# Patient Record
Sex: Male | Born: 1945 | Race: White | Hispanic: No | Marital: Married | Smoking: Never smoker
Health system: Southern US, Community
[De-identification: ages and names within clinical notes are randomized; demographics above are authoritative.]

## PROBLEM LIST (undated history)

## (undated) DIAGNOSIS — I480 Paroxysmal atrial fibrillation: Secondary | ICD-10-CM

## (undated) DIAGNOSIS — M1990 Unspecified osteoarthritis, unspecified site: Secondary | ICD-10-CM

## (undated) DIAGNOSIS — C61 Malignant neoplasm of prostate: Secondary | ICD-10-CM

## (undated) DIAGNOSIS — C4490 Unspecified malignant neoplasm of skin, unspecified: Secondary | ICD-10-CM

## (undated) DIAGNOSIS — N400 Enlarged prostate without lower urinary tract symptoms: Secondary | ICD-10-CM

## (undated) DIAGNOSIS — I499 Cardiac arrhythmia, unspecified: Secondary | ICD-10-CM

## (undated) DIAGNOSIS — E785 Hyperlipidemia, unspecified: Secondary | ICD-10-CM

## (undated) DIAGNOSIS — C801 Malignant (primary) neoplasm, unspecified: Secondary | ICD-10-CM

## (undated) DIAGNOSIS — Z9289 Personal history of other medical treatment: Secondary | ICD-10-CM

## (undated) DIAGNOSIS — S76119A Strain of unspecified quadriceps muscle, fascia and tendon, initial encounter: Secondary | ICD-10-CM

## (undated) DIAGNOSIS — Z9861 Coronary angioplasty status: Secondary | ICD-10-CM

## (undated) DIAGNOSIS — R011 Cardiac murmur, unspecified: Secondary | ICD-10-CM

## (undated) DIAGNOSIS — R0600 Dyspnea, unspecified: Secondary | ICD-10-CM

## (undated) DIAGNOSIS — I2510 Atherosclerotic heart disease of native coronary artery without angina pectoris: Secondary | ICD-10-CM

## (undated) DIAGNOSIS — I10 Essential (primary) hypertension: Secondary | ICD-10-CM

## (undated) DIAGNOSIS — C3492 Malignant neoplasm of unspecified part of left bronchus or lung: Secondary | ICD-10-CM

## (undated) DIAGNOSIS — R0989 Other specified symptoms and signs involving the circulatory and respiratory systems: Secondary | ICD-10-CM

## (undated) DIAGNOSIS — K219 Gastro-esophageal reflux disease without esophagitis: Secondary | ICD-10-CM

## (undated) DIAGNOSIS — Z860100 Personal history of colon polyps, unspecified: Secondary | ICD-10-CM

## (undated) DIAGNOSIS — I4892 Unspecified atrial flutter: Secondary | ICD-10-CM

## (undated) HISTORY — DX: Personal history of colon polyps, unspecified: Z86.0100

## (undated) HISTORY — DX: Paroxysmal atrial fibrillation: I48.0

## (undated) HISTORY — DX: Personal history of other medical treatment: Z92.89

## (undated) HISTORY — PX: CORONARY STENT PLACEMENT: SHX1402

## (undated) HISTORY — DX: Essential (primary) hypertension: I10

## (undated) HISTORY — DX: Atherosclerotic heart disease of native coronary artery without angina pectoris: I25.10

## (undated) HISTORY — PX: VASECTOMY: SHX75

## (undated) HISTORY — DX: Coronary angioplasty status: Z98.61

## (undated) HISTORY — DX: Hyperlipidemia, unspecified: E78.5

## (undated) HISTORY — DX: Unspecified atrial flutter: I48.92

## (undated) HISTORY — DX: Unspecified malignant neoplasm of skin, unspecified: C44.90

## (undated) HISTORY — DX: Personal history of colonic polyps: Z86.010

## (undated) HISTORY — PX: COLONOSCOPY: SHX174

## (undated) HISTORY — DX: Benign prostatic hyperplasia without lower urinary tract symptoms: N40.0

## (undated) HISTORY — PX: BACK SURGERY: SHX140

## (undated) HISTORY — DX: Other specified symptoms and signs involving the circulatory and respiratory systems: R09.89

## (undated) HISTORY — DX: Malignant neoplasm of unspecified part of left bronchus or lung: C34.92

---
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
## 2000-01-15 ENCOUNTER — Encounter: Admission: RE | Admit: 2000-01-15 | Discharge: 2000-01-15 | Payer: Self-pay | Admitting: Family Medicine

## 2000-01-15 ENCOUNTER — Encounter: Payer: Self-pay | Admitting: Family Medicine

## 2002-11-23 DIAGNOSIS — I2510 Atherosclerotic heart disease of native coronary artery without angina pectoris: Secondary | ICD-10-CM

## 2002-11-23 DIAGNOSIS — Z9861 Coronary angioplasty status: Secondary | ICD-10-CM

## 2002-11-23 HISTORY — DX: Coronary angioplasty status: Z98.61

## 2002-11-23 HISTORY — DX: Atherosclerotic heart disease of native coronary artery without angina pectoris: I25.10

## 2003-02-22 ENCOUNTER — Encounter: Payer: Self-pay | Admitting: *Deleted

## 2003-02-22 ENCOUNTER — Inpatient Hospital Stay (HOSPITAL_COMMUNITY): Admission: EM | Admit: 2003-02-22 | Discharge: 2003-02-24 | Payer: Self-pay | Admitting: Emergency Medicine

## 2003-03-19 ENCOUNTER — Encounter (HOSPITAL_COMMUNITY): Admission: RE | Admit: 2003-03-19 | Discharge: 2003-05-23 | Payer: Self-pay | Admitting: Internal Medicine

## 2005-08-24 ENCOUNTER — Ambulatory Visit: Payer: Self-pay | Admitting: Family Medicine

## 2005-08-31 ENCOUNTER — Ambulatory Visit: Payer: Self-pay | Admitting: Family Medicine

## 2005-10-23 ENCOUNTER — Ambulatory Visit: Payer: Self-pay | Admitting: Family Medicine

## 2006-09-06 ENCOUNTER — Ambulatory Visit: Payer: Self-pay | Admitting: Family Medicine

## 2006-09-06 LAB — CONVERTED CEMR LAB
ALT: 28 units/L (ref 0–40)
AST: 33 units/L (ref 0–37)
Albumin: 4.1 g/dL (ref 3.5–5.2)
Alkaline Phosphatase: 55 units/L (ref 39–117)
BUN: 19 mg/dL (ref 6–23)
Basophils Absolute: 0 10*3/uL (ref 0.0–0.1)
Basophils Relative: 0.4 % (ref 0.0–1.0)
CO2: 30 meq/L (ref 19–32)
Calcium: 9.3 mg/dL (ref 8.4–10.5)
Chloride: 108 meq/L (ref 96–112)
Chol/HDL Ratio, serum: 3.1
Cholesterol: 128 mg/dL (ref 0–200)
Creatinine, Ser: 1.4 mg/dL (ref 0.4–1.5)
Eosinophil percent: 1.8 % (ref 0.0–5.0)
GFR calc non Af Amer: 55 mL/min
Glomerular Filtration Rate, Af Am: 66 mL/min/{1.73_m2}
Glucose, Bld: 103 mg/dL — ABNORMAL HIGH (ref 70–99)
HCT: 43.3 % (ref 39.0–52.0)
HDL: 41.2 mg/dL (ref >39.0)
Hemoglobin: 14.7 g/dL (ref 13.0–17.0)
Hgb A1c MFr Bld: 5.3 % (ref 4.6–6.0)
LDL Cholesterol: 75 mg/dL (ref 0–99)
Lymphocytes Relative: 41 % (ref 12.0–46.0)
MCHC: 33.9 g/dL (ref 30.0–36.0)
MCV: 93.9 fL (ref 78.0–100.0)
Monocytes Absolute: 0.4 10*3/uL (ref 0.2–0.7)
Monocytes Relative: 9.6 % (ref 3.0–11.0)
Neutro Abs: 1.8 10*3/uL (ref 1.4–7.7)
Neutrophils Relative %: 47.2 % (ref 43.0–77.0)
PSA: 3.27 ng/mL (ref 0.10–4.00)
Platelets: 161 10*3/uL (ref 150–400)
Potassium: 4.1 meq/L (ref 3.5–5.1)
RBC: 4.61 M/uL (ref 4.22–5.81)
RDW: 12.5 % (ref 11.5–14.6)
Sodium: 144 meq/L (ref 135–145)
TSH: 1.25 microintl units/mL (ref 0.35–5.50)
Total Bilirubin: 1.6 mg/dL — ABNORMAL HIGH (ref 0.3–1.2)
Total Protein: 6.8 g/dL (ref 6.0–8.3)
Triglyceride fasting, serum: 57 mg/dL (ref 0–149)
VLDL: 11 mg/dL (ref 0–40)
WBC: 3.9 10*3/uL — ABNORMAL LOW (ref 4.5–10.5)

## 2006-10-20 ENCOUNTER — Ambulatory Visit: Payer: Self-pay | Admitting: Family Medicine

## 2006-11-06 ENCOUNTER — Emergency Department (HOSPITAL_COMMUNITY): Admission: EM | Admit: 2006-11-06 | Discharge: 2006-11-06 | Payer: Self-pay | Admitting: Family Medicine

## 2006-12-15 ENCOUNTER — Encounter: Admission: RE | Admit: 2006-12-15 | Discharge: 2006-12-15 | Payer: Self-pay | Admitting: Orthopedic Surgery

## 2007-01-18 ENCOUNTER — Ambulatory Visit: Payer: Self-pay | Admitting: Family Medicine

## 2007-01-19 ENCOUNTER — Encounter: Payer: Self-pay | Admitting: Family Medicine

## 2007-01-20 ENCOUNTER — Ambulatory Visit: Payer: Self-pay | Admitting: Family Medicine

## 2007-02-03 ENCOUNTER — Ambulatory Visit: Payer: Self-pay | Admitting: Family Medicine

## 2007-03-28 ENCOUNTER — Encounter: Admission: RE | Admit: 2007-03-28 | Discharge: 2007-03-28 | Payer: Self-pay | Admitting: Orthopedic Surgery

## 2007-04-13 ENCOUNTER — Encounter: Admission: RE | Admit: 2007-04-13 | Discharge: 2007-04-13 | Payer: Self-pay | Admitting: Orthopedic Surgery

## 2007-06-16 ENCOUNTER — Inpatient Hospital Stay (HOSPITAL_COMMUNITY): Admission: RE | Admit: 2007-06-16 | Discharge: 2007-06-18 | Payer: Self-pay | Admitting: Neurological Surgery

## 2007-06-24 DIAGNOSIS — E785 Hyperlipidemia, unspecified: Secondary | ICD-10-CM | POA: Insufficient documentation

## 2007-06-24 DIAGNOSIS — Z860100 Personal history of colon polyps, unspecified: Secondary | ICD-10-CM | POA: Insufficient documentation

## 2007-06-24 DIAGNOSIS — I2510 Atherosclerotic heart disease of native coronary artery without angina pectoris: Secondary | ICD-10-CM

## 2007-06-24 DIAGNOSIS — I10 Essential (primary) hypertension: Secondary | ICD-10-CM | POA: Insufficient documentation

## 2007-06-24 DIAGNOSIS — I25119 Atherosclerotic heart disease of native coronary artery with unspecified angina pectoris: Secondary | ICD-10-CM | POA: Insufficient documentation

## 2007-10-18 ENCOUNTER — Ambulatory Visit: Payer: Self-pay | Admitting: Family Medicine

## 2007-10-18 LAB — CONVERTED CEMR LAB
ALT: 36 U/L
AST: 37 U/L
Albumin: 4.1 g/dL
Alkaline Phosphatase: 71 U/L
BUN: 19 mg/dL
Basophils Absolute: 0 K/uL
Basophils Relative: 0.4 %
Bilirubin, Direct: 0.3 mg/dL
CO2: 31 meq/L
Calcium: 9.4 mg/dL
Chloride: 103 meq/L
Cholesterol: 147 mg/dL
Creatinine, Ser: 1.3 mg/dL
Eosinophils Absolute: 0 K/uL
Eosinophils Relative: 1 %
GFR calc Af Amer: 72 mL/min
GFR calc non Af Amer: 59 mL/min
Glucose, Bld: 89 mg/dL
Glucose, Urine, Semiquant: NEGATIVE
HCT: 44.2 %
HDL: 42.4 mg/dL
Hemoglobin: 15.4 g/dL
LDL Cholesterol: 91 mg/dL
Lymphocytes Relative: 42.7 %
MCHC: 34.8 g/dL
MCV: 89.3 fL
Monocytes Absolute: 0.5 K/uL
Monocytes Relative: 11.5 % — ABNORMAL HIGH
Neutro Abs: 2.2 K/uL
Neutrophils Relative %: 44.4 %
Nitrite: NEGATIVE
PSA: 5.82 ng/mL — ABNORMAL HIGH
Platelets: 159 K/uL
Potassium: 4.2 meq/L
RBC: 4.96 M/uL
RDW: 12.6 %
Sodium: 143 meq/L
Specific Gravity, Urine: 1.02
TSH: 1.62 u[IU]/mL
Total Bilirubin: 1.9 mg/dL — ABNORMAL HIGH
Total CHOL/HDL Ratio: 3.5
Total Protein: 6.7 g/dL
Triglycerides: 66 mg/dL
Urobilinogen, UA: 0.2
VLDL: 13 mg/dL
WBC Urine, dipstick: NEGATIVE
WBC: 4.7 10*3/microliter
pH: 5.5

## 2007-10-24 ENCOUNTER — Ambulatory Visit: Payer: Self-pay | Admitting: Family Medicine

## 2007-10-24 DIAGNOSIS — N401 Enlarged prostate with lower urinary tract symptoms: Secondary | ICD-10-CM | POA: Insufficient documentation

## 2008-01-30 ENCOUNTER — Ambulatory Visit: Payer: Self-pay | Admitting: Family Medicine

## 2008-01-30 DIAGNOSIS — H6980 Other specified disorders of Eustachian tube, unspecified ear: Secondary | ICD-10-CM | POA: Insufficient documentation

## 2008-06-28 ENCOUNTER — Ambulatory Visit: Payer: Self-pay | Admitting: Family Medicine

## 2008-11-20 ENCOUNTER — Ambulatory Visit: Payer: Self-pay | Admitting: Family Medicine

## 2008-11-20 LAB — CONVERTED CEMR LAB
ALT: 29 units/L (ref 0–53)
AST: 33 units/L (ref 0–37)
Albumin: 3.7 g/dL (ref 3.5–5.2)
Alkaline Phosphatase: 69 units/L (ref 39–117)
BUN: 17 mg/dL (ref 6–23)
Basophils Absolute: 0 10*3/uL (ref 0.0–0.1)
Basophils Relative: 0.4 % (ref 0.0–3.0)
Bilirubin Urine: NEGATIVE
Bilirubin, Direct: 0.1 mg/dL (ref 0.0–0.3)
CO2: 32 meq/L (ref 19–32)
Calcium: 9 mg/dL (ref 8.4–10.5)
Chloride: 103 meq/L (ref 96–112)
Cholesterol: 137 mg/dL (ref 0–200)
Creatinine, Ser: 1.2 mg/dL (ref 0.4–1.5)
Eosinophils Absolute: 0.1 10*3/uL (ref 0.0–0.7)
Eosinophils Relative: 1.3 % (ref 0.0–5.0)
GFR calc Af Amer: 78 mL/min
GFR calc non Af Amer: 65 mL/min
Glucose, Bld: 96 mg/dL (ref 70–99)
Glucose, Urine, Semiquant: NEGATIVE
HCT: 44.7 % (ref 39.0–52.0)
HDL: 46.6 mg/dL (ref >39.0)
Hemoglobin: 15.4 g/dL (ref 13.0–17.0)
LDL Cholesterol: 77 mg/dL (ref 0–99)
Lymphocytes Relative: 34.6 % (ref 12.0–46.0)
MCHC: 34.5 g/dL (ref 30.0–36.0)
MCV: 92.7 fL (ref 78.0–100.0)
Monocytes Absolute: 0.4 10*3/uL (ref 0.1–1.0)
Monocytes Relative: 8 % (ref 3.0–12.0)
Neutro Abs: 3.1 10*3/uL (ref 1.4–7.7)
Neutrophils Relative %: 55.7 % (ref 43.0–77.0)
Nitrite: NEGATIVE
PSA: 4.53 ng/mL — ABNORMAL HIGH (ref 0.10–4.00)
Platelets: 153 10*3/uL (ref 150–400)
Potassium: 3.7 meq/L (ref 3.5–5.1)
Protein, U semiquant: NEGATIVE
RBC: 4.83 M/uL (ref 4.22–5.81)
RDW: 12.6 % (ref 11.5–14.6)
Sodium: 140 meq/L (ref 135–145)
Specific Gravity, Urine: 1.025
TSH: 1.28 microintl units/mL (ref 0.35–5.50)
Total Bilirubin: 1.3 mg/dL — ABNORMAL HIGH (ref 0.3–1.2)
Total CHOL/HDL Ratio: 2.9
Total Protein: 6.7 g/dL (ref 6.0–8.3)
Triglycerides: 66 mg/dL (ref 0–149)
Urobilinogen, UA: 0.2
VLDL: 13 mg/dL (ref 0–40)
WBC Urine, dipstick: NEGATIVE
WBC: 5.5 10*3/uL (ref 4.5–10.5)
pH: 5.5

## 2008-11-27 ENCOUNTER — Ambulatory Visit: Payer: Self-pay | Admitting: Family Medicine

## 2009-08-01 DIAGNOSIS — I6522 Occlusion and stenosis of left carotid artery: Secondary | ICD-10-CM

## 2009-08-01 HISTORY — DX: Occlusion and stenosis of left carotid artery: I65.22

## 2009-10-09 ENCOUNTER — Encounter (INDEPENDENT_AMBULATORY_CARE_PROVIDER_SITE_OTHER): Payer: Self-pay | Admitting: *Deleted

## 2009-11-01 ENCOUNTER — Encounter (INDEPENDENT_AMBULATORY_CARE_PROVIDER_SITE_OTHER): Payer: Self-pay | Admitting: *Deleted

## 2009-11-04 ENCOUNTER — Ambulatory Visit: Payer: Self-pay | Admitting: Internal Medicine

## 2009-11-25 ENCOUNTER — Telehealth: Payer: Self-pay | Admitting: Internal Medicine

## 2009-11-28 ENCOUNTER — Ambulatory Visit: Payer: Self-pay | Admitting: Family Medicine

## 2009-11-28 LAB — CONVERTED CEMR LAB
ALT: 30 units/L (ref 0–53)
AST: 41 units/L — ABNORMAL HIGH (ref 0–37)
Albumin: 3.7 g/dL (ref 3.5–5.2)
Alkaline Phosphatase: 71 units/L (ref 39–117)
BUN: 15 mg/dL (ref 6–23)
Basophils Absolute: 0 10*3/uL (ref 0.0–0.1)
Basophils Relative: 0.6 % (ref 0.0–3.0)
Bilirubin Urine: NEGATIVE
Bilirubin, Direct: 0.1 mg/dL (ref 0.0–0.3)
CO2: 27 meq/L (ref 19–32)
Calcium: 8.8 mg/dL (ref 8.4–10.5)
Chloride: 106 meq/L (ref 96–112)
Cholesterol: 129 mg/dL (ref 0–200)
Creatinine, Ser: 1.2 mg/dL (ref 0.4–1.5)
Eosinophils Absolute: 0.1 10*3/uL (ref 0.0–0.7)
Eosinophils Relative: 1.7 % (ref 0.0–5.0)
GFR calc non Af Amer: 64.41 mL/min (ref >60)
Glucose, Bld: 96 mg/dL (ref 70–99)
Glucose, Urine, Semiquant: NEGATIVE
HCT: 45.6 % (ref 39.0–52.0)
HDL: 42.8 mg/dL (ref >39.00)
Hemoglobin: 14.8 g/dL (ref 13.0–17.0)
Ketones, urine, test strip: NEGATIVE
LDL Cholesterol: 76 mg/dL (ref 0–99)
Lymphocytes Relative: 38.1 % (ref 12.0–46.0)
Lymphs Abs: 1.6 10*3/uL (ref 0.7–4.0)
MCHC: 32.4 g/dL (ref 30.0–36.0)
MCV: 94.3 fL (ref 78.0–100.0)
Monocytes Absolute: 0.3 10*3/uL (ref 0.1–1.0)
Monocytes Relative: 8.2 % (ref 3.0–12.0)
Neutro Abs: 2.1 10*3/uL (ref 1.4–7.7)
Neutrophils Relative %: 51.4 % (ref 43.0–77.0)
Nitrite: NEGATIVE
PSA: 7.96 ng/mL — ABNORMAL HIGH (ref 0.10–4.00)
Platelets: 165 10*3/uL (ref 150.0–400.0)
Potassium: 3.9 meq/L (ref 3.5–5.1)
RBC: 4.83 M/uL (ref 4.22–5.81)
RDW: 12.8 % (ref 11.5–14.6)
Sodium: 140 meq/L (ref 135–145)
Specific Gravity, Urine: 1.015
TSH: 1 microintl units/mL (ref 0.35–5.50)
Total Bilirubin: 1.4 mg/dL — ABNORMAL HIGH (ref 0.3–1.2)
Total CHOL/HDL Ratio: 3
Total Protein: 7.6 g/dL (ref 6.0–8.3)
Triglycerides: 49 mg/dL (ref 0.0–149.0)
Urobilinogen, UA: 0.2
VLDL: 9.8 mg/dL (ref 0.0–40.0)
WBC Urine, dipstick: NEGATIVE
WBC: 4.1 10*3/uL — ABNORMAL LOW (ref 4.5–10.5)
pH: 5.5

## 2009-12-19 ENCOUNTER — Ambulatory Visit: Payer: Self-pay | Admitting: Internal Medicine

## 2009-12-19 LAB — HM COLONOSCOPY

## 2009-12-20 ENCOUNTER — Encounter: Payer: Self-pay | Admitting: Internal Medicine

## 2009-12-31 ENCOUNTER — Encounter: Payer: Self-pay | Admitting: Family Medicine

## 2010-01-01 ENCOUNTER — Encounter: Payer: Self-pay | Admitting: Family Medicine

## 2010-01-17 ENCOUNTER — Telehealth: Payer: Self-pay | Admitting: Family Medicine

## 2010-06-23 ENCOUNTER — Encounter: Admission: RE | Admit: 2010-06-23 | Discharge: 2010-06-23 | Payer: Self-pay | Admitting: Cardiology

## 2010-06-24 ENCOUNTER — Ambulatory Visit (HOSPITAL_COMMUNITY): Admission: RE | Admit: 2010-06-24 | Discharge: 2010-06-24 | Payer: Self-pay | Admitting: Cardiology

## 2010-12-14 ENCOUNTER — Encounter: Payer: Self-pay | Admitting: Orthopedic Surgery

## 2010-12-23 NOTE — Miscellaneous (Signed)
Summary: [facility] Previsit/prep  Clinical Lists Changes  Medications: Added new medication of DULCOLAX 5 MG  TBEC (BISACODYL) Day before procedure take 2 at 3pm and 2 at 8pm. - Signed Added new medication of METOCLOPRAMIDE HCL 10 MG  TABS (METOCLOPRAMIDE HCL) As per prep instructions. - Signed Added new medication of MIRALAX   POWD (POLYETHYLENE GLYCOL 3350) As per prep  instructions. - Signed Rx of DULCOLAX 5 MG  TBEC (BISACODYL) Day before procedure take 2 at 3pm and 2 at 8pm.;  #4 x 0;  Signed;  Entered by: Wyona Almas RN;  Authorized by: Hart Carwin MD;  Method used: [facility] [address] [phone]*, [address] [place], [place], [place]  [address], Ph: [phone] or [phone], Fax: [phone] Rx of METOCLOPRAMIDE HCL 10 MG  TABS (METOCLOPRAMIDE HCL) As per prep instructions.;  #2 x 0;  Signed;  Entered by: Wyona Almas RN;  Authorized by: Hart Carwin MD;  Method used: [facility] to [facility] [address] [phone]*, [address], [place], [place], [place]  [address], Ph: [phone] or [phone], Fax: [phone] Rx of MIRALAX   POWD (POLYETHYLENE GLYCOL 3350) As per prep  instructions.;  #255gm x 0;  Signed;  Entered by: Wyona Almas RN;  Authorized by: Hart Carwin MD;  Method used: Electronically to [facility] [address] [phone]*, [address], [place], [place], [place]  [address], Ph: [phone] or [phone], Fax: [phone] Observations: Added new observation of NKA: T (11/04/2009 10:34)    Prescriptions: MIRALAX   POWD (POLYETHYLENE GLYCOL 3350) As per prep  instructions.  #255gm x 0   Entered by:   Wyona Almas RN   Authorized by:   Hart Carwin MD   Signed by:   Wyona Almas RN on 11/04/2009   Method used:   Electronically to        [facility] [address] [phone]* (retail)       [address]       [place] , [place]  [address]       Ph: [phone] or [phone]       Fax: [phone]   RxID:    [id number] METOCLOPRAMIDE HCL 10 MG  TABS (METOCLOPRAMIDE HCL) As per prep instructions.  #2 x 0   Entered by:   Wyona Almas RN   Authorized by:   Hart Carwin MD   Signed by:   Wyona Almas RN on 11/04/2009   Method used:   [facility]ronically to        [facility] [address] [phone]* (retail)       [address]       [place], [place]  [address]       Ph: [phone] or [phone]       Fax: [phone]   RxID:   [id number] DULCOLAX 5 MG  TBEC (BISACODYL) Day before procedure take 2 at 3pm and 2 at 8pm.  #4 x 0   Entered by:   Wyona Almas RN   Authorized by:   Hart Carwin MD   Signed by:   Wyona Almas RN on 11/04/2009   Method used:   Electronically to        [facility] [address] [phone]* (retail)       [address]       [place], [place]  [address]       Ph: [phone] or [phone]       Fax: [phone]   RxID:   [id number]

## 2010-12-23 NOTE — Assessment & Plan Note (Signed)
Summary: CPX/JLS   Vital Signs:  Patient Profile:   65 Years Old Male Height:     73 inches (187.96 cm) Weight:      260 pounds Temp:     98.4 degrees F oral BP sitting:   140 / 84  (left arm)  Vitals Entered By: Kern Reap CMA (November 27, 2008 2:02 PM)                 Chief Complaint:  cpx.  History of Present Illness: Charles Robinson is a 66 year old male, nonsmoker, who comes in today for physical valuation.  Because of underlying coronary disease, hyperlipidemia, hypertension, and BPH.  His coronary disease is, stable.  He's asymptomatic.  He is to see his cardiologist in January for a checkup.  His hyperlipidemia history with Vytorin 10 -- 40 nightly, lipids, a goal LDL 77.  He takes HCTZ, 25 mg daily, along with 240 mg of diltiazem  daily.  PPD at home 116/72.  We always given a bottle of nitroglycerin.  He also takes an aspirin daily and Niaspan thousand milligrams two tabs nightly for low HDL.  HDL is 47.  We sent him to see Dr. Vonita Moss urologist last year because elevated PSA. biopsy was negative.  He was placed on Cardura 8 mg nightly however, his blood pressure dropped below hundred and he was lightheaded.  Therefore, they decrease the dose to 4 mg daily.  He saw Dr. Shiela Mayer for follow-up in October.    Current Allergies: No known allergies   Past Medical History:    Reviewed history from 10/24/2007 and no changes required:       Colonic polyps, hx of       Coronary artery disease       Hyperlipidemia       Hypertension       BPH       stents times two 2004       this surgery 2008 lumbar spine x 2   Family History:    Reviewed history from 10/24/2007 and no changes required:       Family History of CAD Male 1st degree relative <60       Family History Hypertension       Family History of Prostate CA 1st degree relative <50  Social History:    Reviewed history from 10/24/2007 and no changes required:       Retired       Never Smoked       Alcohol  use-yes       Drug use-no       Regular exercise-yes    Review of Systems      See HPI   Physical Exam  General:     Well-developed,well-nourished,in no acute distress; alert,appropriate and cooperative throughout examination Head:     Normocephalic and atraumatic without obvious abnormalities. No apparent alopecia or balding. Eyes:     No corneal or conjunctival inflammation noted. EOMI. Perrla. Funduscopic exam benign, without hemorrhages, exudates or papilledema. Vision grossly normal. Ears:     External ear exam shows no significant lesions or deformities.  Otoscopic examination reveals clear canals, tympanic membranes are intact bilaterally without bulging, retraction, inflammation or discharge. Hearing is grossly normal bilaterally. Nose:     External nasal examination shows no deformity or inflammation. Nasal mucosa are pink and moist without lesions or exudates. Mouth:     Oral mucosa and oropharynx without lesions or exudates.  Teeth in good repair. Neck:  No deformities, masses, or tenderness noted. Chest Wall:     No deformities, masses, tenderness or gynecomastia noted. Breasts:     No masses or gynecomastia noted Lungs:     Normal respiratory effort, chest expands symmetrically. Lungs are clear to auscultation, no crackles or wheezes. Heart:     Normal rate and regular rhythm. S1 and S2 normal without gallop, murmur, click, rub or other extra sounds. Abdomen:     Bowel sounds positive,abdomen soft and non-tender without masses, organomegaly or hernias noted. Genitalia:     Testes bilaterally descended without nodularity, tenderness or masses. No scrotal masses or lesions. No penis lesions or urethral discharge. Msk:     No deformity or scoliosis noted of thoracic or lumbar spine.   Pulses:     R and L carotid,radial,femoral,dorsalis pedis and posterior tibial pulses are full and equal bilaterally Extremities:     No clubbing, cyanosis, edema, or deformity  noted with normal full range of motion of all joints.   Neurologic:     No cranial nerve deficits noted. Station and gait are normal. Plantar reflexes are down-going bilaterally. DTRs are symmetrical throughout. Sensory, motor and coordinative functions appear intact. Skin:     Intact without suspicious lesions or rashes Cervical Nodes:     No lymphadenopathy noted Axillary Nodes:     No palpable lymphadenopathy Inguinal Nodes:     No significant adenopathy Psych:     Cognition and judgment appear intact. Alert and cooperative with normal attention span and concentration. No apparent delusions, illusions, hallucinations    Impression & Recommendations:  Problem # 1:  BENIGN PROSTATIC HYPERTROPHY, WITH URINARY OBSTRUCTION (ICD-600.01) Assessment: Unchanged  Problem # 2:  HYPERTENSION (ICD-401.9) Assessment: Improved  His updated medication list for this problem includes:    Cartia Xt 240 Mg Cp24 (Diltiazem hcl coated beads) .[name]... Take 1 tablet by mouth once a day    Hydrochlorothiazide 25 Mg Tabs (Hydrochlorothiazide) ..... One tablet every morning    Cardura 8 Mg Tabs (Doxazosin mesylate) .[name]... 1 tab @ bedtime   Problem # 3:  HYPERLIPIDEMIA (ICD-272.4) Assessment: Improved  His updated medication list for this problem includes:    Vytorin 10-40 Mg Tabs (Ezetimibe-simvastatin) .[name]... Take 1 tablet by mouth at bedtime    Niaspan 1000 Mg Tbcr (Niacin (antihyperlipidemic)) .[name]... 2tabs at bedtime   Problem # 4:  CORONARY ARTERY DISEASE (ICD-414.00) Assessment: Unchanged  His updated medication list for this problem includes:    Cartia Xt 240 Mg Cp24 (Diltiazem hcl coated beads) .[name]... Take 1 tablet by mouth once a day    Aspirin 325 Mg Tabs (Aspirin) .[name]... Take 1 tablet by mouth once a day    Nitroglycerin 0.4 Mg Subl (Nitroglycerin) .[name]... As needed    Hydrochlorothiazide 25 Mg Tabs (Hydrochlorothiazide) ..... One tablet every morning    Cardura 8 Mg Tabs (Doxazosin mesylate)  .[name]... 1 tab @ bedtime   Complete Medication List: 1)  Cartia Xt 240 Mg Cp24 (Diltiazem hcl coated beads) .... Take 1 tablet by mouth once a day 2)  Vytorin 10-40 Mg Tabs (Ezetimibe-simvastatin) .... Take 1 tablet by mouth at bedtime 3)  Aspirin 325 Mg Tabs (Aspirin) .... Take 1 tablet by mouth once a day 4)  Nitroglycerin 0.4 Mg Subl (Nitroglycerin) .... As needed 5)  Hydrochlorothiazide 25 Mg Tabs (Hydrochlorothiazide) .... One tablet every morning 6)  Multivitamins Tabs (Multiple vitamin) 7)  Glucosamine Hcl 1000 Mg Tabs (Glucosamine hcl) .... Take 1 tablet by mouth once a day  8)  Coenzyme Q10 10 Mg Caps (Coenzyme q10) .... Take 1 tablet by mouth once a day 9)  Niaspan 1000 Mg Tbcr (Niacin (antihyperlipidemic)) .... 2tabs at bedtime 10)  Fish Oil  11)  Cardura 8 Mg Tabs (Doxazosin mesylate) .[name].. 1 tab @ bedtime  Other Orders: Zoster (Shingles) Vaccine Live [phone]) Admin 1st Vaccine (11914)   Patient Instructions: 1)  Please schedule a follow-up appointment in 1 year. 2)  It is important that you exercise regularly at least 20 minutes 5 times a week. If you develop chest pain, have severe difficulty breathing, or feel very tired , stop exercising immediately and seek medical attention. 3)  Schedule a colonoscopy/sigmoidoscopy to help detect colon cancer. 4)  Take an Aspirin every day.   Prescriptions: NIASPAN 1000 MG  TBCR (NIACIN (ANTIHYPERLIPIDEMIC)) 2tabs at bedtime  #200 x 4   Entered and Authorized by:   Roderick Pee MD   Signed by:   Roderick Pee MD on 11/27/2008   Method used:   [facility]ronically to        [facility] [address] [phone]* (retail)       [address]       [place], [place]  [address]       Ph: [phone] or [phone]       Fax: [phone]   RxID:   [id number] HYDROCHLOROTHIAZIDE 25 MG  TABS (HYDROCHLOROTHIAZIDE) One tablet every morning  #100 x 4   Entered and Authorized by:   Roderick Pee MD   Signed by:    Roderick Pee MD on 11/27/2008   Method used:   Electronically to        [facility] [address] [phone]* (retail)       [address]       [place], [place]  [address]       Ph: [phone] or [phone]       Fax: [phone]   RxID:   [id number] NITROGLYCERIN 0.4 MG  SUBL (NITROGLYCERIN) as needed  #25 x 1   Entered and Authorized by:   Roderick Pee MD   Signed by:   Roderick Pee MD on 11/27/2008   Method used:   Electronically to        [facility] [address] [phone]* (retail)       [address]       [place], [place]  [address]       Ph: [phone] or [phone]       Fax: [phone]   RxID:   [id number] VYTORIN 10-40 MG  TABS (EZETIMIBE-SIMVASTATIN) Take 1 tablet by mouth at bedtime  #100 x 4   Entered and Authorized by:   Roderick Pee MD   Signed by:   Roderick Pee MD on 11/27/2008   Method used:   Electronically to        [facility] [address] [phone]* (retail)       [address]       [place], [place]  [address]       Ph: [phone] or [phone]       Fax: [phone]   RxID:   [id number] CARTIA XT 240 MG CP24 (DILTIAZEM HCL COATED BEADS) Take 1 tablet by mouth once a day  #100 x 4  Entered and Authorized by:   Roderick Pee MD   Signed by:   Roderick Pee MD on 11/27/2008   Method used:   Electronically to        [facility] [address] [phone]* (retail)       [address]       [place], [place]  [address]       Ph: [phone] or [phone]       Fax: [phone]   RxID:   [id number]  ]  Zostavax # 1    Vaccine Type: Zostavax    Site: right deltoid    Mfr: Merck    Dose: 0.5 ml    Route: Yell    Given by: Kern Reap CMA    Exp. Date: 01/06/2010    Lot #: [id number]

## 2010-12-23 NOTE — Medication Information (Signed)
Summary: Coverage Approval for Vytorin/BCBS  Coverage Approval for Vytorin/BCBS   Imported By: Maryln Gottron 01/06/2010 14:59:42  _____________________________________________________________________  External Attachment:    Type:   Image     Comment:   External Document

## 2010-12-23 NOTE — Assessment & Plan Note (Signed)
Summary: emp-will fast//ccm   Vital Signs:  Patient profile:   65 year old male Height:      73.5 inches Weight:      257 pounds BMI:     33.57 Temp:     98.5 degrees F oral BP sitting:   160 / 98  (left arm) Cuff size:   regular  Vitals Entered By: Kern Reap CMA Duncan Dull) (November 28, 2009 9:22 AM)  Reason for Visit cpx  History of Present Illness: Charles Robinson is a 65 year old, married male, nonsmoker, who comes in today for evaluation of multiple problems.  He has a history of underlying coronary artery disease.  He saw his cardiologist, Dr. Clarene Duke, 6 months ago.  Exam and EKG were normal.  He's to for a cardiac stress test in February.  He exercises 3 times per week gets his heart rate up to about hundred and 30.  No chest pain, shortness of breath, etc..  He has underlying hyperlipidemia, for which he takes 5 torr and plan -- 40 daily.  Will check lipid panel today.  He also has underlying hypertension, for which he takes hydrochlorothiazide 25 mg daily, diltiazem 240 mg daily, Cardura 8 mg nightly,.  BP 160/98.  States BP at home is normal.  He also has a prescription for nitroglycerin for chest pain, which, fortunately, he's not had to use.  He gets routine eye care.  Dental care.  Colonoscopy done, and GI tetanus 2007 seasonal flu 2010 shingles 2010 will give a Pneumovax today.  He does have underlying allergic rhinitis.  It flares up 3 or 4 times a year.  He takes plain Claritin, with a steroid nasal spray.  he's also had a history of elevated PSA.  Biopsies negative.  PSA, however, is between 4 and 5 over the last two years continues to be asymptomatic  Allergies (verified): No Known Drug Allergies  Past History:  Past medical, surgical, family and social histories (including risk factors) reviewed, and no changes noted (except as noted below).  Past Medical History: Reviewed history from 10/24/2007 and no changes required. Colonic polyps, hx of Coronary artery  disease Hyperlipidemia Hypertension BPH stents times two 2004 this surgery 2008 lumbar spine x 2  Past Surgical History: Reviewed history from 06/24/2007 and no changes required. Stents -2004 Colonoscopy-1999  Family History: Reviewed history from 10/24/2007 and no changes required. Family History of CAD Male 1st degree relative <60 Family History Hypertension Family History of Prostate CA 1st degree relative <50  Social History: Reviewed history from 10/24/2007 and no changes required. Retired Never Smoked Alcohol use-yes Drug use-no Regular exercise-yes  Review of Systems      See HPI  Physical Exam  General:  Well-developed,well-nourished,in no acute distress; alert,appropriate and cooperative throughout examination Head:  Normocephalic and atraumatic without obvious abnormalities. No apparent alopecia or balding. Eyes:  No corneal or conjunctival inflammation noted. EOMI. Perrla. Funduscopic exam benign, without hemorrhages, exudates or papilledema. Vision grossly normal. Ears:  External ear exam shows no significant lesions or deformities.  Otoscopic examination reveals clear canals, tympanic membranes are intact bilaterally without bulging, retraction, inflammation or discharge. Hearing is grossly normal bilaterally. Nose:  External nasal examination shows no deformity or inflammation. Nasal mucosa are pink and moist without lesions or exudates. Mouth:  Oral mucosa and oropharynx without lesions or exudates.  Teeth in good repair. Neck:  No deformities, masses, or tenderness noted. Chest Wall:  No deformities, masses, tenderness or gynecomastia noted. Breasts:  No masses or gynecomastia  noted Lungs:  Normal respiratory effort, chest expands symmetrically. Lungs are clear to auscultation, no crackles or wheezes. Heart:  Normal rate and regular rhythm. S1 and S2 normal without gallop, murmur, click, rub or other extra sounds. Abdomen:  Bowel sounds positive,abdomen  soft and non-tender without masses, organomegaly or hernias noted. Rectal:  No external abnormalities noted. Normal sphincter tone. No rectal masses or tenderness. Genitalia:  Testes bilaterally descended without nodularity, tenderness or masses. No scrotal masses or lesions. No penis lesions or urethral discharge. Prostate:  Prostate gland firm and smooth, no enlargement, nodularity, tenderness, mass, asymmetry or induration. Msk:  No deformity or scoliosis noted of thoracic or lumbar spine.   Pulses:  R and L carotid,radial,femoral,dorsalis pedis and posterior tibial pulses are full and equal bilaterally Extremities:  No clubbing, cyanosis, edema, or deformity noted with normal full range of motion of all joints.   Neurologic:  No cranial nerve deficits noted. Station and gait are normal. Plantar reflexes are down-going bilaterally. DTRs are symmetrical throughout. Sensory, motor and coordinative functions appear intact. Skin:  Intact without suspicious lesions or rashes Cervical Nodes:  No lymphadenopathy noted Axillary Nodes:  No palpable lymphadenopathy Inguinal Nodes:  No significant adenopathy Psych:  Cognition and judgment appear intact. Alert and cooperative with normal attention span and concentration. No apparent delusions, illusions, hallucinations   Impression & Recommendations:  Problem # 1:  BENIGN PROSTATIC HYPERTROPHY, WITH URINARY OBSTRUCTION (ICD-600.01) Assessment Unchanged  His updated medication list for this problem includes:    Cardura 8 Mg Tabs (Doxazosin mesylate) .[name]... 1 tab @ bedtime  Orders: Venipuncture (60454) TLB-Lipid Panel (80061-LIPID) TLB-BMP (Basic Metabolic Panel-BMET) (80048-METABOL) TLB-CBC Platelet - w/Differential (85025-CBCD) TLB-Hepatic/Liver Function Pnl (80076-HEPATIC) TLB-TSH (Thyroid Stimulating Hormone) (84443-TSH) TLB-PSA (Prostate Specific Antigen) (84153-PSA) Prescription Created Electronically [phone]) UA Dipstick w/o Micro  (automated)  (81003)  Problem # 2:  HYPERTENSION (ICD-401.9) Assessment: Improved  His updated medication list for this problem includes:    Cartia Xt 240 Mg Cp24 (Diltiazem hcl coated beads) .[name]... Take 1 tablet by mouth once a day    Hydrochlorothiazide 25 Mg Tabs (Hydrochlorothiazide) ..... One tablet every morning    Cardura 8 Mg Tabs (Doxazosin mesylate) .[name]... 1 tab @ bedtime  Orders: Venipuncture (91478) TLB-Lipid Panel (80061-LIPID) TLB-BMP (Basic Metabolic Panel-BMET) (80048-METABOL) TLB-CBC Platelet - w/Differential (85025-CBCD) TLB-Hepatic/Liver Function Pnl (80076-HEPATIC) TLB-TSH (Thyroid Stimulating Hormone) (84443-TSH) TLB-PSA (Prostate Specific Antigen) (84153-PSA) Prescription Created Electronically [phone]) UA Dipstick w/o Micro (automated)  (81003)  Problem # 3:  HYPERLIPIDEMIA (ICD-272.4) Assessment: Improved  His updated medication list for this problem includes:    Vytorin 10-40 Mg Tabs (Ezetimibe-simvastatin) .[name]... Take 1 tablet by mouth at bedtime    Niaspan 1000 Mg Tbcr (Niacin (antihyperlipidemic)) .[name]... 2tabs at bedtime  Orders: Venipuncture (13086) TLB-Lipid Panel (80061-LIPID) TLB-BMP (Basic Metabolic Panel-BMET) (80048-METABOL) TLB-CBC Platelet - w/Differential (85025-CBCD) TLB-Hepatic/Liver Function Pnl (80076-HEPATIC) TLB-TSH (Thyroid Stimulating Hormone) (84443-TSH) TLB-PSA (Prostate Specific Antigen) (84153-PSA) Prescription Created Electronically [phone]) UA Dipstick w/o Micro (automated)  (81003)  Problem # 4:  CORONARY ARTERY DISEASE (ICD-414.00) Assessment: Unchanged  His updated medication list for this problem includes:    Cartia Xt 240 Mg Cp24 (Diltiazem hcl coated beads) .[name]... Take 1 tablet by mouth once a day    Aspirin 325 Mg Tabs (Aspirin) .[name]... Take 1 tablet by mouth once a day    Nitroglycerin 0.4 Mg Subl (Nitroglycerin) .[name]... As needed    Hydrochlorothiazide 25 Mg Tabs (Hydrochlorothiazide) ..... One tablet every morning     Cardura 8 Mg Tabs (  Doxazosin mesylate) .[name]... 1 tab @ bedtime  Orders: Venipuncture (16109) TLB-Lipid Panel (80061-LIPID) TLB-BMP (Basic Metabolic Panel-BMET) (80048-METABOL) TLB-CBC Platelet - w/Differential (85025-CBCD) TLB-Hepatic/Liver Function Pnl (80076-HEPATIC) TLB-TSH (Thyroid Stimulating Hormone) (84443-TSH) TLB-PSA (Prostate Specific Antigen) (84153-PSA) Prescription Created Electronically [phone]) UA Dipstick w/o Micro (automated)  (81003)  Complete Medication List: 1)  Cartia Xt 240 Mg Cp24 (Diltiazem hcl coated beads) .... Take 1 tablet by mouth once a day 2)  Vytorin 10-40 Mg Tabs (Ezetimibe-simvastatin) .... Take 1 tablet by mouth at bedtime 3)  Aspirin 325 Mg Tabs (Aspirin) .... Take 1 tablet by mouth once a day 4)  Nitroglycerin 0.4 Mg Subl (Nitroglycerin) .... As needed 5)  Hydrochlorothiazide 25 Mg Tabs (Hydrochlorothiazide) .... One tablet every morning 6)  Multivitamins Tabs (Multiple vitamin) 7)  Glucosamine Hcl 1000 Mg Tabs (Glucosamine hcl) .... Take 1 tablet by mouth once a day 8)  Coenzyme Q10 10 Mg Caps (Coenzyme q10) .... Take 1 tablet by mouth once a day 9)  Niaspan 1000 Mg Tbcr (Niacin (antihyperlipidemic)) .... 2tabs at bedtime 10)  Fish Oil  11)  Cardura 8 Mg Tabs (Doxazosin mesylate) .[name].. 1 tab @ bedtime 12)  Dulcolax 5 Mg Tbec (Bisacodyl) .... Day before procedure take 2 at 3pm and 2 at 8pm. 13)  Metoclopramide Hcl 10 Mg Tabs (Metoclopramide hcl) .... As per prep instructions. 14)  Miralax Powd (Polyethylene glycol 3350) .... As per prep  instructions.  Other Orders: Pneumococcal Vaccine (09811) Admin 1st Vaccine (91478)  Patient Instructions: 1)  Please schedule a follow-up appointment in 1 year. 2)  It is important that you exercise regularly at least 20 minutes 5 times a week. If you develop chest pain, have severe difficulty breathing, or feel very tired , stop exercising immediately and seek medical attention. 3)  You need to lose weight.  Consider a lower calorie diet and regular exercise.  4)  Take an Aspirin every day. Prescriptions: CARDURA 8 MG  TABS (DOXAZOSIN MESYLATE) 1 tab @ bedtime  #100 x 3   Entered and Authorized by:   Roderick Pee MD   Signed by:   Roderick Pee MD on 11/28/2009   Method used:   Electronically to        [facility] [address] [phone]* (retail)       [address]       [place], [place]  [address]       Ph: [phone] or [phone]       Fax: [phone]   RxID:   [id number] NIASPAN 1000 MG  TBCR (NIACIN (ANTIHYPERLIPIDEMIC)) 2tabs at bedtime  #200 x 3   Entered and Authorized by:   Roderick Pee MD   Signed by:   Roderick Pee MD on 11/28/2009   Method used:   [facility]        [facility] [address] [phone]* (retail)       [address]       [place], [place]  [address]       Ph: [phone] or [phone]       Fax: [phone]   RxID:   [id number] HYDROCHLOROTHIAZIDE 25 MG  TABS (HYDROCHLOROTHIAZIDE) One tablet every morning  #100 x 3   Entered and Authorized by:   Roderick Pee MD   Signed by:   Roderick Pee MD on 11/28/2009   Method used:   [facility]  [facility] [address] [phone]* (retail)       [address]       [place], [place]  [address]       Ph: [phone] or [phone]       Fax: [phone]   RxID:   [id number] NITROGLYCERIN 0.4 MG  SUBL (NITROGLYCERIN) as needed  #25 x 1   Entered and Authorized by:   Roderick Pee MD   Signed by:   Roderick Pee MD on 11/28/2009   Method used:   [facility]ronically to        [facility] [address] [phone]* (retail)       [address]       [place], [place]  [address]       Ph: [phone] or [phone]       Fax: [phone]   RxID:   [id number] VYTORIN 10-40 MG  TABS (EZETIMIBE-SIMVASTATIN) Take 1 tablet by mouth at bedtime  #100 x 3   Entered and Authorized by:   Roderick Pee MD    Signed by:   Roderick Pee MD on 11/28/2009   Method used:   [facility] to        [facility] [address] [phone]* (retail)       [address]       [place], [place]  [address]       Ph: [phone] or [phone]       Fax: [phone]   RxID:   [id number] CARTIA XT 240 MG CP24 (DILTIAZEM HCL COATED BEADS) Take 1 tablet by mouth once a day  #100 x 3   Entered and Authorized by:   Roderick Pee MD   Signed by:   Roderick Pee MD on 11/28/2009   Method used:   [facility] to        [facility] [address] [phone]* (retail)       [address]       [place], [place]  [address]       Ph: [phone] or [phone]       Fax: [phone]   RxID:   [id number]    Immunization History:  Influenza Immunization History:    Influenza:  historical (08/23/2009)  Immunizations Administered:  Pneumonia Vaccine:    Vaccine Type: Pneumovax    Site: right deltoid    Mfr: Merck    Dose: 0.5 ml    Route: IM    Given by: Kern Reap CMA (AAMA)    Exp. Date: 11/02/2010    Lot #: [id number]    Physician counseled: yes    Laboratory Results   Urine Tests    Routine Urinalysis   Color: brown Appearance: Clear Glucose: negative   (Normal Range: Negative) Bilirubin: negative   (Normal Range: Negative) Ketone: negative   (Normal Range: Negative) Spec. Gravity: 1.015   (Normal Range: 1.003-1.035) Blood: 2+   (Normal Range: Negative) pH: 5.5   (Normal Range: 5.0-8.0) Protein: 1+   (Normal Range: Negative) Urobilinogen: 0.2   (Normal Range: 0-1) Nitrite: negative   (Normal Range: Negative) Leukocyte Esterace: negative   (Normal Range: Negative)    Comments: Joanne Chars CMA  November 28, 2009 1:58 PM

## 2010-12-23 NOTE — Assessment & Plan Note (Signed)
Summary: CPX/NJR   Vital Signs:  Patient Profile:   65 Years Old Male Height:     74 inches (187.96 cm) Weight:      251 pounds (114.09 kg) Temp:     98.5 degrees F (36.94 degrees C) oral Pulse rate:   70 / minute Resp:     12 per minute BP sitting:   133 / 82  (left arm)  Pt. in pain?   no  Vitals Entered By: Arcola Jansky, RN (October 24, 2007 9:27 AM)                  Chief Complaint:  cpx and labs done.  History of Present Illness: physical we because of underlying hypertension, hyperlipidemia, coronary disease, and BPH.  Several ice, and a good urine.  No major problems except he had back surgery in July.  He had a ruptured disk with foot drop and Dr. Danielle Dess did too distant.  His lumbar spine.  He is recovered and has done okay.  He continues to have nocturia x 3, because his BPH.  He tried Flomax twice, but on both occasions, developed a rapid heart rate and stopped it.  We discussed increasing the Cardura to see if that would help the BPH.  We also discussed other medical options, including a surgical option.  The urology.  He said no chest pain.  He exercises on a regular basis without chest pain, nor shortness of breath.  He gets cardiac eval yearly.  Acute Visit History:      He denies abdominal pain, chest pain, constipation, cough, diarrhea, earache, eye symptoms, fever, genitourinary symptoms, headache, musculoskeletal symptoms, nasal discharge, nausea, rash, sinus problems, sore throat, and vomiting.         Current Allergies: No known allergies   Past Medical History:    Reviewed history from 06/24/2007 and no changes required:       Colonic polyps, hx of       Coronary artery disease       Hyperlipidemia       Hypertension       BPH       stents times two 2004       this surgery 2008 lumbar spine x 2   Family History:    Reviewed history and no changes required:       Family History of CAD Male 1st degree relative <60       Family  History Hypertension       Family History of Prostate CA 1st degree relative <50  Social History:    Reviewed history and no changes required:       Retired       Never Smoked       Alcohol use-yes       Drug use-no       Regular exercise-yes   Risk Factors:  Tobacco use:  never Drug use:  no Alcohol use:  yes Exercise:  yes   Review of Systems      See HPI   Physical Exam  General:     Well-developed,well-nourished,in no acute distress; alert,appropriate and cooperative throughout examination Head:     Normocephalic and atraumatic without obvious abnormalities. No apparent alopecia or balding. Eyes:     No corneal or conjunctival inflammation noted. EOMI. Perrla. Funduscopic exam benign, without hemorrhages, exudates or papilledema. Vision grossly normal. Ears:     External ear exam shows no significant lesions or deformities.  Otoscopic examination  reveals clear canals, tympanic membranes are intact bilaterally without bulging, retraction, inflammation or discharge. Hearing is grossly normal bilaterally. Nose:     External nasal examination shows no deformity or inflammation. Nasal mucosa are pink and moist without lesions or exudates. Mouth:     Oral mucosa and oropharynx without lesions or exudates.  Teeth in good repair. Neck:     No deformities, masses, or tenderness noted. Chest Wall:     No deformities, masses, tenderness or gynecomastia noted. Breasts:     No masses or gynecomastia noted Lungs:     Normal respiratory effort, chest expands symmetrically. Lungs are clear to auscultation, no crackles or wheezes. Heart:     Normal rate and regular rhythm. S1 and S2 normal without gallop, murmur, click, rub or other extra sounds. Abdomen:     Bowel sounds positive,abdomen soft and non-tender without masses, organomegaly or hernias noted. Rectal:     No external abnormalities noted. Normal sphincter tone. No rectal masses or tenderness. Genitalia:     Testes  bilaterally descended without nodularity, tenderness or masses. No scrotal masses or lesions. No penis lesions or urethral discharge. Prostate:     rectum was normal.  Prostate 2+ symmetrical.  BPH Msk:     No deformity or scoliosis noted of thoracic or lumbar spine.   Pulses:     R and L carotid,radial,femoral,dorsalis pedis and posterior tibial pulses are full and equal bilaterally Extremities:     No clubbing, cyanosis, edema, or deformity noted with normal full range of motion of all joints.   Neurologic:     No cranial nerve deficits noted. Station and gait are normal. Plantar reflexes are down-going bilaterally. DTRs are symmetrical throughout. Sensory, motor and coordinative functions appear intact. Skin:     Intact without suspicious lesions or rashes Cervical Nodes:     No lymphadenopathy noted Axillary Nodes:     No palpable lymphadenopathy Inguinal Nodes:     No significant adenopathy Psych:     Cognition and judgment appear intact. Alert and cooperative with normal attention span and concentration. No apparent delusions, illusions, hallucinations    Impression & Recommendations:  Problem # 1:  PHYSICAL EXAMINATION (ICD-V70.0) Assessment: Unchanged  Problem # 2:  HYPERTENSION (ICD-401.9) Assessment: Improved  The following medications were removed from the medication list:    Doxazosin Mesylate 2 Mg Tabs (Doxazosin mesylate) .[name]... Take 1 tab by mouth at bedtime    Accupril 10 Mg Tabs (Quinapril hcl) .[name]... Take 1 tablet by mouth two times a day  His updated medication list for this problem includes:    Cartia Xt 240 Mg Cp24 (Diltiazem hcl coated beads) .[name]... Take 1 tablet by mouth once a day    Hydrochlorothiazide 25 Mg Tabs (Hydrochlorothiazide) ..... One tablet every morning    Cardura 8 Mg Tabs (Doxazosin mesylate) .[name]... 1 tab @ bedtime   Problem # 3:  HYPERLIPIDEMIA (ICD-272.4) Assessment: Improved  His updated medication list for this problem includes:     Vytorin 10-40 Mg Tabs (Ezetimibe-simvastatin) .[name]... Take 1 tablet by mouth at bedtime    Niaspan 1000 Mg Tbcr (Niacin (antihyperlipidemic)) .[name]... 2tabs at bedtime   Problem # 4:  CORONARY ARTERY DISEASE (ICD-414.00) Assessment: Unchanged  The following medications were removed from the medication list:    Doxazosin Mesylate 2 Mg Tabs (Doxazosin mesylate) .[name]... Take 1 tab by mouth at bedtime    Accupril 10 Mg Tabs (Quinapril hcl) .[name]... Take 1 tablet by mouth two times a day  His updated medication list for this problem includes:    Cartia Xt 240 Mg Cp24 (Diltiazem hcl coated beads) .[name]... Take 1 tablet by mouth once a day    Aspirin 325 Mg Tabs (Aspirin) .[name]... Take 1 tablet by mouth once a day    Nitroglycerin 0.4 Mg Subl (Nitroglycerin) .[name]... As needed    Hydrochlorothiazide 25 Mg Tabs (Hydrochlorothiazide) ..... One tablet every morning    Cardura 8 Mg Tabs (Doxazosin mesylate) .[name]... 1 tab @ bedtime   Problem # 5:  BENIGN PROSTATIC HYPERTROPHY, WITH URINARY OBSTRUCTION (ICD-600.01) Assessment: Deteriorated  Complete Medication List: 1)  Cartia Xt 240 Mg Cp24 (Diltiazem hcl coated beads) .... Take 1 tablet by mouth once a day 2)  Vytorin 10-40 Mg Tabs (Ezetimibe-simvastatin) .... Take 1 tablet by mouth at bedtime 3)  Aspirin 325 Mg Tabs (Aspirin) .... Take 1 tablet by mouth once a day 4)  Nitroglycerin 0.4 Mg Subl (Nitroglycerin) .... As needed 5)  Saw Palmetto 80 Mg Caps (Saw palmetto (serenoa repens)) .... Otc 6)  Hydrochlorothiazide 25 Mg Tabs (Hydrochlorothiazide) .... One tablet every morning 7)  Multivitamins Tabs (Multiple vitamin) 8)  Glucosamine Hcl 1000 Mg Tabs (Glucosamine hcl) .... Take 1 tablet by mouth once a day 9)  Coenzyme Q10 10 Mg Caps (Coenzyme q10) .... Take 1 tablet by mouth once a day 10)  Niaspan 1000 Mg Tbcr (Niacin (antihyperlipidemic)) .... 2tabs at bedtime 11)  Fish Oil  12)  Cardura 8 Mg Tabs (Doxazosin mesylate) .[name].. 1 tab @ bedtime  Other Orders:  Influenza Vaccine MCR (81191)   Patient Instructions: 1)  Please schedule a follow-up appointment in 1 year. 2)  It is important that you exercise regularly at least 20 minutes 5 times a week. If you develop chest pain, have severe difficulty breathing, or feel very tired , stop exercising immediately and seek medical attention 3)  increase the Cardura from 2 mg a day to 8 mg a day.  Let's see if this will help with the prostate issue.  Give it two months.  If after that, you don't see any improvement.  Call and will discuss other options. 4)   also, because your PSA was elevated call Dr. Sharon Seller, Shiela Mayer for evaluation in 3 weeks.  In the meantime take his Cipro, 5 mg twice a day to see if the elevation is due to some subacute inflammation.    Prescriptions: NIASPAN 1000 MG  TBCR (NIACIN (ANTIHYPERLIPIDEMIC)) 2tabs at bedtime  #200 x 4   Entered and Authorized by:   Roderick Pee MD   Signed by:   Roderick Pee MD on 10/24/2007   Method used:   Electronically sent to ...       [facility] [address] [phone]*       [address]       [place], [place]  [address]       Ph: [phone] or [phone]       Fax: [phone]   RxID:   [id number] HYDROCHLOROTHIAZIDE 25 MG  TABS (HYDROCHLOROTHIAZIDE) One tablet every morning  #100 x 4   Entered and Authorized by:   Roderick Pee MD   Signed by:   Roderick Pee MD on 10/24/2007   Method used:   Electronically sent to ...       [facility] [address] [phone]*       [address]       [place]  [place], [place]  [address]       Ph: [phone] or [phone]       Fax: [phone]   RxID:   [id number] NITROGLYCERIN 0.4 MG  SUBL (NITROGLYCERIN) as needed  #25 x 1   Entered and Authorized by:   Roderick Pee MD   Signed by:   Roderick Pee MD on 10/24/2007   Method used:   Electronically sent to ...       [facility] [address] [phone]*       [address]       [place], [place]  [address]       Ph: [phone] or [phone]       Fax: [phone]   RxID:   [id number] VYTORIN 10-40 MG  TABS (EZETIMIBE-SIMVASTATIN) Take 1 tablet by mouth at bedtime  #100 x 4   Entered and Authorized by:   Roderick Pee MD   Signed by:   Roderick Pee MD on 10/24/2007   Method used:   Electronically sent to ...       [facility] [address] [phone]*       [address]       [place], [place]  [address]       Ph: [phone] or [phone]       Fax: [phone]   RxID:   [id number] CARTIA XT 240 MG CP24 (DILTIAZEM HCL COATED BEADS) Take 1 tablet by mouth once a day  #100 x 4   Entered and Authorized by:   Roderick Pee MD   Signed by:   Roderick Pee MD on 10/24/2007   Method used:   Electronically sent to ...       [facility] [address] [phone]*       [address]       [place], [place]  [address]       Ph: [phone] or [phone]       Fax: [phone]   RxID:   [id number] CARDURA 8 MG  TABS (DOXAZOSIN MESYLATE) 1 tab @ bedtime  #100 x 4   Entered and Authorized by:   Roderick Pee MD   Signed by:   Roderick Pee MD on 10/24/2007   Method used:   Electronically sent to ...       [facility] [address] [phone]*       [address]       [place], [place]  [address]       Ph: [phone] or [phone]       Fax: [phone]   RxID:   [id number]  ]  Tetanus/Td Immunization History:    Tetanus/Td # 1:  Td (10/23/2006)  Influenza Vaccine    Vaccine Type: Fluvax MCR    Given by: dr Tawanna Cooler  Flu Vaccine Consent Questions    Do you have a history of severe allergic reactions to this vaccine? no    Any prior history of allergic reactions to egg and/or gelatin? no    Do you have a sensitivity to the preservative Thimersol? no    Do you have a past history of Guillan-Barre Syndrome? no    Do you currently have an acute febrile  illness? no    Have you  ever had a severe reaction to latex? no    Vaccine information given and explained to patient? yes

## 2010-12-23 NOTE — Assessment & Plan Note (Signed)
Summary: CONGESTION/CCM   Vital Signs:  Patient Profile:   65 Years Old Male Height:     74 inches (187.96 cm) Weight:      260 pounds Temp:     98.1 degrees F oral BP sitting:   152 / 88  (left arm)  Vitals Entered By: Rock Nephew CMA (January 30, 2008 2:43 PM)                 Chief Complaint:  head congestion x 3-4 days.  History of Present Illness: Charles Robinson is a 65 year old male, who is going to [place] tomorrow to comes in today because of head congestion, but won't clear.  He concerned about flying with the severe head congestion.  He does have a history of allergic rhinitis season.  Nonsmoker    Current Allergies: No known allergies    Social History:    Reviewed history from 10/24/2007 and no changes required:       Retired       Never Smoked       Alcohol use-yes       Drug use-no       Regular exercise-yes    Review of Systems      See HPI   Physical Exam  General:     Well-developed,well-nourished,in no acute distress; alert,appropriate and cooperative throughout examination Head:     Normocephalic and atraumatic without obvious abnormalities. No apparent alopecia or balding. Eyes:     No corneal or conjunctival inflammation noted. EOMI. Perrla. Funduscopic exam benign, without hemorrhages, exudates or papilledema. Vision grossly normal. Ears:     External ear exam shows no significant lesions or deformities.  Otoscopic examination reveals clear canals, tympanic membranes are intact bilaterally without bulging, retraction, inflammation or discharge. Hearing is grossly normal bilaterally. Nose:     External nasal examination shows no deformity or inflammation. Nasal mucosa are pink and moist without lesions or exudates. Mouth:     Oral mucosa and oropharynx without lesions or exudates.  Teeth in good repair. Neck:     No deformities, masses, or tenderness noted. Chest Wall:     No deformities, masses, tenderness or gynecomastia noted. Lungs:  Normal respiratory effort, chest expands symmetrically. Lungs are clear to auscultation, no crackles or wheezes.    Impression & Recommendations:  Problem # 1:  EUSTACHIAN TUBE DYSFUNCTION, LEFT (ICD-381.81) Assessment: New  Complete Medication List: 1)  Cartia Xt 240 Mg Cp24 (Diltiazem hcl coated beads) .... Take 1 tablet by mouth once a day 2)  Vytorin 10-40 Mg Tabs (Ezetimibe-simvastatin) .... Take 1 tablet by mouth at bedtime 3)  Aspirin 325 Mg Tabs (Aspirin) .... Take 1 tablet by mouth once a day 4)  Nitroglycerin 0.4 Mg Subl (Nitroglycerin) .... As needed 5)  Saw Palmetto 80 Mg Caps (Saw palmetto (serenoa repens)) .... Otc 6)  Hydrochlorothiazide 25 Mg Tabs (Hydrochlorothiazide) .... One tablet every morning 7)  Multivitamins Tabs (Multiple vitamin) 8)  Glucosamine Hcl 1000 Mg Tabs (Glucosamine hcl) .... Take 1 tablet by mouth once a day 9)  Coenzyme Q10 10 Mg Caps (Coenzyme q10) .... Take 1 tablet by mouth once a day 10)  Niaspan 1000 Mg Tbcr (Niacin (antihyperlipidemic)) .... 2tabs at bedtime 11)  Fish Oil  12)  Cardura 8 Mg Tabs (Doxazosin mesylate) .[name].. 1 tab @ bedtime   Patient Instructions: 1)  take a short course of prednisone before you supplied.  Also take Afrin nasal spray with you    ]

## 2010-12-23 NOTE — Assessment & Plan Note (Signed)
Summary: stitch removal/mhf    Chief Complaint:  suture removal.  History of Present Illness: Charles Robinson is a 65 year old male, who comes in today.  Have sutures removed from the index finger of his left hand.  He had these put in an urgent care center about 10 days ago.  He needs them removed.  He's had no problems.  Wound is well-healed.  No infection.      Prior Medication List:  CARTIA XT 240 MG CP24 (DILTIAZEM HCL COATED BEADS) Take 1 tablet by mouth once a day VYTORIN 10-40 MG  TABS (EZETIMIBE-SIMVASTATIN) Take 1 tablet by mouth at bedtime ASPIRIN 325 MG  TABS (ASPIRIN) Take 1 tablet by mouth once a day NITROGLYCERIN 0.4 MG  SUBL (NITROGLYCERIN) as needed SAW PALMETTO 80 MG  CAPS (SAW PALMETTO (SERENOA REPENS)) otc HYDROCHLOROTHIAZIDE 25 MG  TABS (HYDROCHLOROTHIAZIDE) One tablet every morning MULTIVITAMINS   TABS (MULTIPLE VITAMIN)  GLUCOSAMINE HCL 1000 MG  TABS (GLUCOSAMINE HCL) Take 1 tablet by mouth once a day COENZYME Q10 10 MG  CAPS (COENZYME Q10) Take 1 tablet by mouth once a day NIASPAN 1000 MG  TBCR (NIACIN (ANTIHYPERLIPIDEMIC)) 2tabs at bedtime * FISH OIL  CARDURA 8 MG  TABS (DOXAZOSIN MESYLATE) 1 tab @ bedtime   Current Allergies: No known allergies       Physical Exam  General:     Well-developed,well-nourished,in no acute distress; alert,appropriate and cooperative throughout examination Skin:     sutures x 7 removed from index finger wound, low heeled    Impression & Recommendations:  Problem # 1:  ENCOUNTER FOR REMOVAL OF SUTURES (ICD-V58.32) Assessment: New  Complete Medication List: 1)  Cartia Xt 240 Mg Cp24 (Diltiazem hcl coated beads) .... Take 1 tablet by mouth once a day 2)  Vytorin 10-40 Mg Tabs (Ezetimibe-simvastatin) .... Take 1 tablet by mouth at bedtime 3)  Aspirin 325 Mg Tabs (Aspirin) .... Take 1 tablet by mouth once a day 4)  Nitroglycerin 0.4 Mg Subl (Nitroglycerin) .... As needed 5)  Saw Palmetto 80 Mg Caps (Saw palmetto (serenoa  repens)) .... Otc 6)  Hydrochlorothiazide 25 Mg Tabs (Hydrochlorothiazide) .... One tablet every morning 7)  Multivitamins Tabs (Multiple vitamin) 8)  Glucosamine Hcl 1000 Mg Tabs (Glucosamine hcl) .... Take 1 tablet by mouth once a day 9)  Coenzyme Q10 10 Mg Caps (Coenzyme q10) .... Take 1 tablet by mouth once a day 10)  Niaspan 1000 Mg Tbcr (Niacin (antihyperlipidemic)) .... 2tabs at bedtime 11)  Fish Oil  12)  Cardura 8 Mg Tabs (Doxazosin mesylate) .[name].. 1 tab @ bedtime   Patient Instructions: 1)  such her finger in warm water once a day for 10 minutes.  Keep the wound clean and covered with a Band-Aid.  Do this daily for a week after that the wound should be well healed and then leave it open to the air 2)  Please schedule a follow-up appointment as needed.   ]

## 2010-12-23 NOTE — Progress Notes (Signed)
Summary: Canceled Colonoscopy  Phone Note Call from Patient   Caller: Patient Call For: Dr. Juanda Chance Reason for Call: Talk to Nurse Summary of Call: Pt rescheduled his colonoscopy from tomorrow 1.4.11 to 1.27.11 because he is sick with a cold. Wants to know if he will be charged cancelation fee? Initial call taken by: Karna Christmas,  November 25, 2009 8:36 AM  Follow-up for Phone Call        Please do not charge patient. Pt. is aware. Follow-up by: Laureen Ochs LPN,  November 25, 2009 2:27 PM  Additional Follow-up for Phone Call Additional follow up Details #1::        Patient NOT BILLED. Additional Follow-up by: Leanor Kail [facility],  November 26, 2009 8:33 AM

## 2010-12-23 NOTE — Letter (Signed)
Summary: [facility] Instructions  [facility]  [address] [place], [place] [address]   Phone: [phone]  Fax: [phone]       [name]    07-03-1946    MRN: [id number]       Procedure Day [name]: Charles Robinson. 11/26/09     Arrival Time: 8:30am     Procedure Time: 9:30am     Location of Procedure:                    _X _  [facility] (4th Floor) )   PREPARATION FOR COLONOSCOPY WITH MIRALAX  Starting 5 days prior to your procedure 11/21/09  do not eat nuts, seeds, popcorn, corn, beans, peas,  salads, or any raw vegetables.  Do not take any fiber supplements (e.g. Metamucil, Citrucel, and Benefiber). ____________________________________________________________________________________________________   THE DAY BEFORE YOUR PROCEDURE         DATE: 11/25/09  DAY:  Mon.  1   Drink clear liquids the entire day-NO SOLID FOOD  2   Do not drink anything colored red or purple.  Avoid juices with pulp.  No orange juice.  3   Drink at least 64 oz. (8 glasses) of fluid/clear liquids during the day to prevent dehydration and help the prep work efficiently.  CLEAR LIQUIDS INCLUDE: Water Jello Ice Popsicles Tea (sugar ok, no milk/cream) Powdered fruit flavored drinks Coffee (sugar ok, no milk/cream) Gatorade Juice: apple, white grape, white cranberry  Lemonade Clear bullion, consomm, broth Carbonated beverages (any kind) Strained chicken noodle soup Hard Candy  4   Mix the entire bottle of Miralax with 64 oz. of Gatorade/Powerade in the morning and put in the refrigerator to chill.  5   At 3:00 pm take 2 Dulcolax/Bisacodyl tablets.  6   At 4:30 pm take one Reglan/Metoclopramide tablet.  7  Starting at 5:00 pm drink one 8 oz glass of the Miralax mixture every 15-20 minutes until you have finished drinking the entire 64 oz.  You should finish drinking prep around 7:30 or 8:00 pm.  8   If you are nauseated, you may take the 2nd Reglan/Metoclopramide  tablet at 6:30 pm.        9    At 8:00 pm take 2 more DULCOLAX/Bisacodyl tablets.     THE DAY OF YOUR PROCEDURE      DATE:  11/26/09 DAY: Charles Robinson.  You may drink clear liquids until 7:30am   (2 HOURS BEFORE PROCEDURE).   MEDICATION INSTRUCTIONS  Unless otherwise instructed, you should take regular prescription medications with a small sip of water as early as possible the morning of your procedure.  Additional medication instructions: Hold HCTZ the morning of procedure.  Take Diltiazem HCL         OTHER INSTRUCTIONS  You will need a responsible adult at least 67 years of age to accompany you and drive you home.   This person must remain in the waiting room during your procedure.  Wear loose fitting clothing that is easily removed.  Leave jewelry and other valuables at home.  However, you may wish to bring a book to read or an iPod/MP3 player to listen to music as you wait for your procedure to start.  Remove all body piercing jewelry and leave at home.  Total time from sign-in until discharge is approximately 2-3 hours.  You should go home directly after your procedure and rest.  You can resume normal activities the day after your procedure.  The day of your procedure you should not:   Drive   Make legal decisions   Operate machinery   Drink alcohol   Return to work  You will receive specific instructions about eating, activities and medications before you leave.   The above instructions have been reviewed and explained to me by   Wyona Almas RN  November 04, 2009 10:58 AM     I fully understand and can verbalize these instructions _____________________________ Date _______

## 2010-12-23 NOTE — Progress Notes (Signed)
Summary: refills  Phone Note Refill Request Message from:  Fax from Pharmacy on January 17, 2010 1:39 PM  Refills Requested: Medication #1:  HYDROCHLOROTHIAZIDE 25 MG  TABS One tablet every morning  Medication #2:  CARTIA XT 240 MG CP24 Take 1 tablet by mouth once a day  Medication #3:  CARDURA 8 MG  TABS 1 tab @ bedtime.  Medication #4:  NIASPAN 1000 MG  TBCR 2tabs at bedtime Initial call taken by: Kern Reap CMA Duncan Dull),  January 17, 2010 1:40 PM    Prescriptions: CARDURA 8 MG  TABS (DOXAZOSIN MESYLATE) 1 tab @ bedtime  #100 x 3   Entered by:   Kern Reap CMA (AAMA)   Authorized by:   Roderick Pee MD   Signed by:   Kern Reap CMA (AAMA) on 01/17/2010   Method used:   [facility]        [facility] [address] [phone]* (retail)       [address]       [place], [place]  [address]       Ph: [phone] or [phone]       Fax: [phone]   RxID:   [id number] NIASPAN 1000 MG  TBCR (NIACIN (ANTIHYPERLIPIDEMIC)) 2tabs at bedtime  #200 x 3   Entered by:   Kern Reap CMA (AAMA)   Authorized by:   Roderick Pee MD   Signed by:   Kern Reap CMA (AAMA) on 01/17/2010   Method used:   Electronically to        [facility] [address] [phone]* (retail)       [address]       [place], [place]  [address]       Ph: [phone] or [phone]       Fax: [phone]   RxID:   [id number] CARTIA XT 240 MG CP24 (DILTIAZEM HCL COATED BEADS) Take 1 tablet by mouth once a day  #100 x 3   Entered by:   Kern Reap CMA (AAMA)   Authorized by:   Roderick Pee MD   Signed by:   Kern Reap CMA (AAMA) on 01/17/2010   Method used:   [facility]        [facility] [address] [phone]* (retail)       [address]       [place], [place]  [address]       Ph: [phone] or [phone]       Fax: [phone]   RxID:   [id number] HYDROCHLOROTHIAZIDE 25 MG  TABS  (HYDROCHLOROTHIAZIDE) One tablet every morning  #100 x 3   Entered by:   Kern Reap CMA (AAMA)   Authorized by:   Roderick Pee MD   Signed by:   Kern Reap CMA (AAMA) on 01/17/2010   Method used:   Electronically to        [facility] [address] [phone]* (retail)       [address]       [place], [place]  [address]       Ph: [phone] or [phone]       Fax: [phone]   RxID:   [id number]

## 2010-12-23 NOTE — Letter (Signed)
Summary: Previsit letter  [facility]  [address] [place], [place] [address]   Phone: [phone]  Fax: [phone]       10/09/2009 MRN: [id number]  Charles Robinson [address] [place], [place]  [address]  Dear [name],  Welcome to the Gastroenterology Division at [facility].    You are scheduled to see a nurse for your pre-procedure visit on 11/04/2009 at 10:30am on the 3rd floor at [facility], [address]  We ask that you try to arrive at our office 15 minutes prior to your appointment time to allow for check-in.  Your nurse visit will consist of discussing your medical and surgical history, your immediate family medical history, and your medications.    Please bring a complete list of all your medications or, if you prefer, bring the medication bottles and we will list them.  We will need to be aware of both prescribed and over the counter drugs.  We will need to know exact dosage information as well.  If you are on blood thinners (Coumadin, Plavix, Aggrenox, Ticlid, etc.) please call our office today/prior to your appointment, as we need to consult with your physician about holding your medication.   Please be prepared to read and sign documents such as consent forms, a financial agreement, and acknowledgement forms.  If necessary, and with your consent, a friend or relative is welcome to sit-in on the nurse visit with you.  Please bring your insurance card so that we may make a copy of it.  If your insurance requires a referral to see a specialist, please bring your referral form from your primary care physician.  No co-pay is required for this nurse visit.     If you cannot keep your appointment, please call [phone] to cancel or reschedule prior to your appointment date.  This allows [place] the opportunity to schedule an appointment for another patient in need of care.    Thank you for choosing [facility] for your medical  needs.  We appreciate the opportunity to care for you.  Please visit [place] at our website  to learn more about our practice.                     Sincerely.                                                                                                                   The Gastroenterology Division

## 2010-12-23 NOTE — Procedures (Signed)
Summary: Colonoscopy  Patient: Breanna Mcdaniel Note: All result statuses are Final unless otherwise noted.  Tests: (1) Colonoscopy (COL)   COL Colonoscopy           DONE     [facility]     [address]     [place], [place]  [address]           COLONOSCOPY PROCEDURE REPORT           PATIENT:  Esgar, Barnick  MR#:  [id number]     BIRTHDATE:  [birth year], 63 yrs. old  GENDER:  male           ENDOSCOPIST:  Hedwig Morton. Juanda Chance, MD     Referred by:           PROCEDURE DATE:  12/19/2009     PROCEDURE:  Colonoscopy 19[address]     ASA CLASS:  Class I     INDICATIONS:  colon polyp1999 not retrieved     no polyps 2003           MEDICATIONS:   Versed 8 mg, Fentanyl 75 mcg           DESCRIPTION OF PROCEDURE:   After the risks benefits and     alternatives of the procedure were thoroughly explained, informed     consent was obtained.  Digital rectal exam was performed and     revealed no rectal masses.   The LB CF-[id number] endoscope     was introduced through the anus and advanced to the cecum, which     was identified by both the appendix and ileocecal valve, without     limitations.  The quality of the prep was good, using MiraLax.     The instrument was then slowly withdrawn as the colon was fully     examined.     <<PROCEDUREIMAGES>>           FINDINGS:  Two polyps were found in the sigmoid colon (see     image1). at 25 cm, 2 diminutive polyps removed with cold biopsy     Mild diverticulosis was found in the sigmoid colon (see image3).     This was otherwise a normal examination of the colon (see image4     and image2).   Retroflexed views in the rectum revealed no     abnormalities.    The scope was then withdrawn from the patient     and the procedure completed.           COMPLICATIONS:  None           ENDOSCOPIC IMPRESSION:     1) Two polyps in the sigmoid colon     2) Mild diverticulosis in the sigmoid colon     3) Otherwise normal examination     RECOMMENDATIONS:  1) Await biopsy results     2) high fiber diet           REPEAT EXAM:  In 7 - 10 year(s) for.           ______________________________     Hedwig Morton. Juanda Chance, MD           CC:  Roderick Pee, MD           n.     Rosalie DoctorHedwig Morton. Marionna Gonia at 12/19/2009 10:15 AM           Kelton Pillar, [phone]  Note: An exclamation mark (!) indicates a result  that was not dispersed into the flowsheet. Document Creation Date: 12/19/2009 10:15 AM _______________________________________________________________________  (1) Order result status: Final Collection or observation date-time: 12/19/2009 10:08 Requested date-time:  Receipt date-time:  Reported date-time:  Referring Physician:   Ordering Physician: Lina Sar [phone]) Specimen Source:  Source: Launa Grill Order Number: [id number] Lab site:   Appended Document: Colonoscopy     Colonoscopy  Procedure date:  12/19/2009  Findings:      Location:  [facility].    Appended Document: Colonoscopy     Procedures Next Due Date:    Colonoscopy: 12/2016

## 2010-12-23 NOTE — Letter (Signed)
Summary: Patient Notice- Polyp Results  [name]  [address] [place], [place] [address]   Phone: [phone]  Fax: [phone]        December 20, 2009 MRN: [id number]    Charles Robinson [address] [place], [place]  [address]    Dear [name],  I am pleased to inform you that the colon polyp(s) removed during your recent colonoscopy was (were) found to be benign (no cancer detected) upon pathologic examination.Both polyps were hyperplastic  I recommend you have a repeat colonoscopy examination in 7_ years to look for recurrent polyps, as having colon polyps increases your risk for having recurrent polyps or even colon cancer in the future.  Should you develop new or worsening symptoms of abdominal pain, bowel habit changes or bleeding from the rectum or bowels, please schedule an evaluation with either your primary care physician or with me.  Additional information/recommendations:  _x_ No further action with gastroenterology is needed at this time. Please      follow-up with your primary care physician for your other healthcare      needs.  __ Please call [phone] to schedule a return visit to review your      situation.  __ Please keep your follow-up visit as already scheduled.  __ Continue treatment plan as outlined the day of your exam.  Please call us if you are having persistent problems or have questions about your condition that have not been fully answered at this time.  Sincerely,  Hart Carwin MD  This letter has been electronically signed by your physician.  Appended Document: Patient Notice- Polyp Results Letter mailed 2.1.11

## 2010-12-26 NOTE — Medication Information (Signed)
Summary: Approval for Vytorin/[employer]  Approval for Vytorin/[employer]   Imported By: Maryln Gottron 01/03/2010 11:24:36  _____________________________________________________________________  External Attachment:    Type:   Image     Comment:   External Document

## 2011-01-12 ENCOUNTER — Encounter: Payer: Self-pay | Admitting: Family Medicine

## 2011-01-13 ENCOUNTER — Ambulatory Visit (INDEPENDENT_AMBULATORY_CARE_PROVIDER_SITE_OTHER): Payer: Medicare Other | Admitting: Family Medicine

## 2011-01-13 ENCOUNTER — Encounter: Payer: Self-pay | Admitting: Family Medicine

## 2011-01-13 DIAGNOSIS — I2510 Atherosclerotic heart disease of native coronary artery without angina pectoris: Secondary | ICD-10-CM

## 2011-01-13 DIAGNOSIS — I10 Essential (primary) hypertension: Secondary | ICD-10-CM

## 2011-01-13 DIAGNOSIS — I4891 Unspecified atrial fibrillation: Secondary | ICD-10-CM

## 2011-01-13 DIAGNOSIS — E785 Hyperlipidemia, unspecified: Secondary | ICD-10-CM

## 2011-01-13 DIAGNOSIS — N401 Enlarged prostate with lower urinary tract symptoms: Secondary | ICD-10-CM

## 2011-01-13 DIAGNOSIS — I480 Paroxysmal atrial fibrillation: Secondary | ICD-10-CM | POA: Insufficient documentation

## 2011-01-13 LAB — CBC WITH DIFFERENTIAL/PLATELET
Basophils Absolute: 0 10*3/uL (ref 0.0–0.1)
Basophils Relative: 0.5 % (ref 0.0–3.0)
Eosinophils Absolute: 0.1 10*3/uL (ref 0.0–0.7)
Eosinophils Relative: 1.1 % (ref 0.0–5.0)
HCT: 47.2 % (ref 39.0–52.0)
Hemoglobin: 16 g/dL (ref 13.0–17.0)
Lymphocytes Relative: 38.1 % (ref 12.0–46.0)
Lymphs Abs: 1.7 10*3/uL (ref 0.7–4.0)
MCHC: 33.9 g/dL (ref 30.0–36.0)
MCV: 93.3 fl (ref 78.0–100.0)
Monocytes Absolute: 0.4 10*3/uL (ref 0.1–1.0)
Monocytes Relative: 9 % (ref 3.0–12.0)
Neutro Abs: 2.3 10*3/uL (ref 1.4–7.7)
Neutrophils Relative %: 51.3 % (ref 43.0–77.0)
Platelets: 137 10*3/uL — ABNORMAL LOW (ref 150.0–400.0)
RBC: 5.05 Mil/uL (ref 4.22–5.81)
RDW: 13.7 % (ref 11.5–14.6)
WBC: 4.5 10*3/uL (ref 4.5–10.5)

## 2011-01-13 LAB — BASIC METABOLIC PANEL
BUN: 22 mg/dL (ref 6–23)
CO2: 31 mEq/L (ref 19–32)
Calcium: 9.2 mg/dL (ref 8.4–10.5)
Chloride: 104 mEq/L (ref 96–112)
Creatinine, Ser: 1.3 mg/dL (ref 0.4–1.5)
GFR: 57.5 mL/min — ABNORMAL LOW (ref >60.00)
Glucose, Bld: 93 mg/dL (ref 70–99)
Potassium: 3.8 mEq/L (ref 3.5–5.1)
Sodium: 142 mEq/L (ref 135–145)

## 2011-01-13 LAB — LIPID PANEL
Cholesterol: 146 mg/dL (ref 0–200)
HDL: 47.3 mg/dL (ref >39.00)
LDL Cholesterol: 79 mg/dL (ref 0–99)
Total CHOL/HDL Ratio: 3
Triglycerides: 99 mg/dL (ref 0.0–149.0)
VLDL: 19.8 mg/dL (ref 0.0–40.0)

## 2011-01-13 LAB — POCT URINALYSIS DIPSTICK
Bilirubin, UA: NEGATIVE
Glucose, UA: NEGATIVE
Ketones, UA: NEGATIVE
Leukocytes, UA: NEGATIVE
Nitrite, UA: NEGATIVE
Protein, UA: NEGATIVE
Spec Grav, UA: 1.025
Urobilinogen, UA: 1
pH, UA: 5.5

## 2011-01-13 LAB — HEPATIC FUNCTION PANEL
ALT: 34 U/L (ref 0–53)
AST: 39 U/L — ABNORMAL HIGH (ref 0–37)
Albumin: 4.3 g/dL (ref 3.5–5.2)
Alkaline Phosphatase: 57 U/L (ref 39–117)
Bilirubin, Direct: 0.3 mg/dL (ref 0.0–0.3)
Total Bilirubin: 2 mg/dL — ABNORMAL HIGH (ref 0.3–1.2)
Total Protein: 7.3 g/dL (ref 6.0–8.3)

## 2011-01-13 LAB — TSH: TSH: 1.37 u[IU]/mL (ref 0.35–5.50)

## 2011-01-13 MED ORDER — DOXAZOSIN MESYLATE 8 MG PO TABS: 8.0000 mg | ORAL_TABLET | Freq: Every day | ORAL | Status: DC

## 2011-01-13 MED ORDER — METOPROLOL SUCCINATE ER 25 MG PO TB24: 25.0000 mg | ORAL_TABLET | Freq: Two times a day (BID) | ORAL | Status: DC

## 2011-01-13 MED ORDER — HYDROCHLOROTHIAZIDE 25 MG PO TABS: 25.0000 mg | ORAL_TABLET | Freq: Every day | ORAL | Status: DC

## 2011-01-13 MED ORDER — NIACIN ER (ANTIHYPERLIPIDEMIC) 1000 MG PO TBCR: 2000.0000 mg | EXTENDED_RELEASE_TABLET | Freq: Every day | ORAL | Status: DC

## 2011-01-13 MED ORDER — EZETIMIBE-SIMVASTATIN 10-40 MG PO TABS: 1.0000 | ORAL_TABLET | Freq: Every day | ORAL | Status: DC

## 2011-01-13 MED ORDER — NITROGLYCERIN 0.4 MG SL SUBL: 0.4000 mg | SUBLINGUAL_TABLET | SUBLINGUAL | Status: DC | PRN

## 2011-01-13 MED ORDER — DILTIAZEM HCL ER COATED BEADS 240 MG PO CP24: 240.0000 mg | ORAL_CAPSULE | Freq: Every day | ORAL | Status: DC

## 2011-01-13 NOTE — Progress Notes (Signed)
Addended by: Rossie Muskrat on: 01/13/2011 11:56 AM   Modules accepted: Orders

## 2011-01-13 NOTE — Progress Notes (Signed)
  Subjective:    Patient ID: Charles Robinson, male    DOB: December 26, 1945, 65 y.o.   MRN: [id number]  HPI [name] Is a 65 year old, married male, nonsmoker, who comes in today for general physical examination because of a history of hyperlipidemia, hypertension, coronary disease, BPH, with some mild urinary outlet structure in.  Recently, had and evaluation, and has a torn ACL right knee also degenerative cartilage, putting off knee replacement surgery.  Also, this year had an episode of atrial fibrillation.  Dr. Clarene Duke and his cardiologist.  Lymph in the hospital and did cardioversion is now back in sinus rhythm continues to be on Coumadin, and one aspirin tablet daily.  His hypertension is treated with Cardizem 240 mg daily, Cardura 8 mg daily, undergoing thiazide 25 mg daily.  His hyperlipidemia is treated with Vytorin 10 -- 40 daily and Niaspan 1000 mg daily.  Dr. Clarene Duke.  Also added Toprol 25 mg daily for rate control.  He continues to have a mildly elevated PSA, along with some BPH.  Dr. Vonita Moss his urologist is watching that.  PSAs range from 7 to 8.  Previous biopsies negative.  Review of systems otherwise negative   Review of Systems  Constitutional: Negative.   HENT: Negative.   Eyes: Negative.   Respiratory: Negative.   Cardiovascular: Negative.   Gastrointestinal: Negative.   Genitourinary: Negative.   Musculoskeletal: Negative.   Skin: Negative.   Neurological: Negative.   Hematological: Negative.   Psychiatric/Behavioral: Negative.        Objective:   Physical Exam  Constitutional: He is oriented to person, place, and time. He appears well-developed and well-nourished.  HENT:  Head: Normocephalic and atraumatic.  Right Ear: External ear normal.  Left Ear: External ear normal.  Nose: Nose normal.  Mouth/Throat: Oropharynx is clear and moist.  Eyes: Conjunctivae and EOM are normal. Pupils are equal, round, and reactive to light.  Neck: Normal range of motion. Neck  supple. No JVD present. No tracheal deviation present. No thyromegaly present.  Cardiovascular: Normal rate, regular rhythm, normal heart sounds and intact distal pulses.  Exam reveals no gallop and no friction rub.   No murmur heard. Pulmonary/Chest: Effort normal and breath sounds normal. No stridor. No respiratory distress. He has no wheezes. He has no rales. He exhibits no tenderness.  Abdominal: Soft. Bowel sounds are normal. He exhibits no distension and no mass. There is no tenderness. There is no rebound and no guarding.  Musculoskeletal: Normal range of motion. He exhibits no edema and no tenderness.  Lymphadenopathy:    He has no cervical adenopathy.  Neurological: He is alert and oriented to person, place, and time. He has normal reflexes. No cranial nerve deficit. He exhibits normal muscle tone.  Skin: Skin is warm and dry. No rash noted. No erythema. No pallor.  Psychiatric: He has a normal mood and affect. His behavior is normal. Judgment and thought content normal.          Assessment & Plan:  Hypertension continue diltiazem 240 mg daily, Toprol 25 mg daily, either course Isaiah 25 mg daily.  Hyperlipidemia.  Continue Niaspan 1000 mg daily and Vytorin 10 -- 40 daily.  History of coronary disease, asymptomatic.  History of atrial fibrillation, back in sinus rhythm after cardioversion.  Continue Coumadin and aspirin per cardiologist.  Degenerative joint disease both knees.  Advise continuing nonweightbearing exercise.  BPH with elevation PSA followed by urology.  Watchful waiting

## 2011-01-13 NOTE — Patient Instructions (Signed)
Continue your current medications follow-up in one year or sooner if any problems 

## 2011-02-06 LAB — PROTIME-INR
INR: 2.24 — ABNORMAL HIGH (ref 0.00–1.49)
Prothrombin Time: 24.9 seconds — ABNORMAL HIGH (ref 11.6–15.2)

## 2011-04-07 NOTE — Op Note (Signed)
NAMEITAI, BARBIAN NO.:  [id number]   MEDICAL RECORD NO.:  [id number]          PATIENT TYPE:  INP   LOCATION:  3172                         FACILITY:  [facility]   PHYSICIAN:  Stefani Dama, M.D.  DATE OF BIRTH:  05-03-1946   DATE OF PROCEDURE:  06/16/2007  DATE OF DISCHARGE:                               OPERATIVE REPORT   PREOPERATIVE DIAGNOSIS:  Herniated nucleus pulposus L2-L3, foraminal  stenosis left L4-L5 with lumbar radiculopathy and foot drop on the left.   POSTOPERATIVE DIAGNOSIS:  Herniated nucleus pulposus L2-L3, foraminal  stenosis left L4-L5 with lumbar radiculopathy and foot drop on the left.   OPERATION:  L2 laminectomy, bilateral diskectomy L2-L3 with operating  microscope, microdissection technique. L4-L5 laminotomy and foraminotomy  on the left with operating microscope, microdissection technique.   SURGEON:  Stefani Dama, M.D.   FIRST ASSISTANT:  Dr. Delma Officer.   ANESTHESIA:  General endotracheal.   INDICATIONS:  Dakwon Wenberg is a 65 year old individual who developed  increasing back pain with the sudden onset of foot drop approximately  three weeks ago.  The patient notes that he has had pain in the region  of the right thigh and lateral aspect of the thigh and he feels his  right leg has been weak.  More acutely though, he noted the onset of  painful weakness in the left lower extremity.  He notes that since that  time much of weakness has gone;however, the foot remains extremely weak  and he has difficulty great difficulty with dorsiflexion on the left leg  and foot.  MRI demonstrates presence of a large central disk herniation  at the L2-L3 level obliterating nearly completely the spinal canal in  addition there is left-sided foraminal stenosis at L4-L5.  He has been  advised regarding surgical decompression on both these levels.  He is  now taken to the operating room for same.   PROCEDURE:  The patient was brought to the  operating room supine on the  stretcher.  After smooth induction of general endotracheal anesthesia,  he was placed on table prone position.  The back was prepped with  DuraPrep and draped in a sterile fashion.  Midline incision was created  over the lower lumbar spine.  This was carried down to lumbodorsal  fascia.  The fascia was peeled slightly on the left side of midline.  The marker was placed down to the laminar arch.  This identified the  disk space at the L4-L5 level and the L4 laminar arch was identified  positively with a radiograph.  A subperiosteal dissection was then  performed to clear out the interlaminar space at L4-L5 more fully.  Laminotomy was then created using a 5 mm round ball bur and dissecting  out the inferior margin lamina of L4 and the mesial wall the facette  joint at L4-L5.  The yellow ligament was then taken up in this region.  The common dural tube was exposed underneath this. In the lateral  recess, there was noted be substantial bony overgrowth from the facette  joint itself causing some significant  medial compression and recess  stenosis for the exiting L5 nerve root so it was taken down and the path  of the L5 nerve root from the area around the disk space at L4-L5 all  the way into the foramen was free and clear.  This was done with the use  of operating microscope and microdissection technique.  When this  decompression was completed, hemostasis in the soft tissues was  obtained.  The incision was then extended cephalad and by counting the  spinous processes, the L2-L3 interspace was identified positively.  Laminotomy was then performed first on the left side and then also on  the right side.  The yellow ligament was identified.  This was noted be  very thickened and redundant.  Carefully this was taken up using a 2 and  3 mm Kerrison punch from the left side.  The dura was not yet fully  identified when a single punch with Kerrison punch yielded some  flow of  spinal fluid.  At this point it was felt that more full exposure of the  laminar arch would need to be undertaken.  Thus laminectomy of L2, which  was partial was performed.  This however allowed to visualize across the  midline.  Laminotomies were then further enlarged.  Care was taken to  release the yellow ligament from the undersurface of the L2 vertebra and  peel it down very slowly and cautiously.  The dural rent was identified  centrally and dorsally and this was tamponaded with some Gelfoam pads  and then cottonoid patties were used to surround and identify all the  borders of the dural tear.  The dural tear itself was approximately 5 mm  in length.  Once the lateral borders were secured and the laminotomy was  enlarged to widen up, the microscope brought back into the field and  then by carefully dissecting along the edges of the dural opening the  dural edges were lifted and secured with 6-0 Prolene sutures.  This was  done first from the left side and then on the right side.  The nerve  roots were noted to be bulging through the opening; however, by this  time much of CSF had already decompressed itself and by gently lifting  the dural edges and crossing over the stay sutures, near-perfect closure  of the dura was obtained.  Then with a single 6-0 Prolene suture a  running closure was performed lengthwise fashion with the use of the  operating microscope.  The edges were secured.  In the end, there was no  leakage of spinal fluid to a Valsalva of 30 cm of water pressure.  With  this, the stay sutures were cut and removed and the procedure was  continued by dissecting on the lateral aspect on the right side.  A  fragment of subligamentous disk was encountered.  The ligament itself  was opened and the fragment was removed and this allowed for good  decompression of the lateral sac.  On the left side, however, there was  a fair amount of dissection over the lateral aspect  that yielded a  rather large prominent free fragment of disk.  This was removed in a  single piece and further dissection yielded two other moderately sized  disk fragment.  This allowed for good decompression of the  subligamentous space at the L2-L3 space.  Then by dissecting back on the  right side of the spinal canal, the disk space itself was entered  from  the opening in the ligament and the disk space was noted be quite  narrowed.  Nonetheless a combination of 2 mm disk rongeurs and a series  of Epstein curettes was used to evacuate the contents of the disk space  more thoroughly.  Once this had been accomplished by both medial and  lateral dissection under the operating microscope and hemostasis was  achieved in the soft tissues, it was felt that the area could be closed.  Though there was no spinal fluid leakage the dural repair was coated  with thin layer of Tisseel.  Hemostasis in the epidural space was  obtained meticulously and the lumbodorsal fascia was closed with #1  Vicryl interrupted fashion.  20 mL of Marcaine 0.5% was left in the  paraspinous musculature to aid with postoperative pain and then 2-0  Vicryl was used in subcutaneous tissues and 3-0 Vicryl subcuticularly.  Blood loss estimated at 500 mL.  The patient tolerated procedure well  was returned to recovery room in stable condition.      Stefani Dama, M.D.  Electronically Signed     HJE/MEDQ  D:  06/16/2007  T:  06/17/2007  Job:  [id number]

## 2011-04-10 NOTE — Assessment & Plan Note (Signed)
[facility]                                 ON-CALL NOTE   BARAK, BIALECKI                      MRN:          [id number]  DATE:01/14/2007                            DOB:          09-Jul-1946    Phone call comes from his wife, Tron Flythe, at [phone].  Phone call at  4:17 p.m. on January 14, 2007.   Mr. Samek woke up with fever.  It has been mostly about 102 despite  giving Tylenol every 4 hours.  He has been fatigued and staying in bed  all day, which is the first time he has ever done that in 40 years of  marriage, according to his wife.  He is not having trouble breathing.  He is able to drink fine.  He is not having real GI symptoms, and his  wife wonders whether they should give him some ibuprofen.   PLAN:  Since he did not sound acutely ill or in distress, I did not  think that emergency evaluation was going to be necessary.  I did tell  her about some warning signs.  I told them that he might get some better  symptom relief from the ibuprofen, it would be fine to try 2 or 3 of  those instead, or in addition to the Tylenol.     Karie Schwalbe, MD  Electronically Signed    RIL/MedQ  DD: 01/16/2007  DT: 01/16/2007  Job #: [id number]   cc:   [name], MD

## 2011-04-10 NOTE — Cardiovascular Report (Signed)
NAME:  BRAVE, DACK NO.:  [id number]   MEDICAL RECORD NO.:  [id number]                   PATIENT TYPE:  INP   LOCATION:  6599                                 FACILITY:  [facility]   PHYSICIAN:  Salvadore Farber, M.D. [facility]         DATE OF BIRTH:  1946-11-13   DATE OF PROCEDURE:  02/23/2003  DATE OF DISCHARGE:                              CARDIAC CATHETERIZATION   PROCEDURE:  Coronary angiography, left heart catheterization, left  ventriculography, stent to the left anterior descending, intravascular  ultrasound of the left anterior descending, intravascular ultrasound of the  circumflex (part of the CETP study), stent right coronary artery.   INDICATIONS:  The patient is a 65 year old gentleman with hypertension and  dyslipidemia who presents with 1 week of chest discomfort occurring with  minimal exertion and relieved with rest.  He was admitted to the hospital  and ruled out for myocardial infarction by serial enzymes.  Electrocardiogram was unrevealing.  Based on his classic symptoms, he was  referred directly for coronary angiography.   PROCEDURAL TECHNIQUE:  Informed consent was obtained.  The patient consented  to participate in the CETP study utilizing intravascular ultrasound to  assess a novelle agent to combat dyslipidemia.   Under 1% lidocaine local anesthesia, a 6-French sheath was placed in the  right femoral artery using the modified Seldinger technique.  Diagnostic  angiography and ventriculography were performed using JL4, JR4, and pigtail  catheters.  The case then turned to intervention.   Attention was first turned to the LAD.  Anticoagulation was initiated with  heparin and eptifibatide to achieve an ACT of greater than 200 seconds.  A  CLS 4.0 guide was advanced over a wire and engaged in the ostium of the left  main coronary.  A BMW wire was advanced to the distal LAD.  The distal  portion of the LAD lesion was directly stented  using a 2.5 x 28-mm Cypher  stent, deployed at 16 atmospheres.  The more proximal segment of this  lengthy lesion was then addressed using a 3.5 x 33-mm Cypher stent,  positioned so as to overlap the proximal edge of the previously placed  stent.  It was deployed at 16 atmospheres.  The more distal portion of the 2  stents was then post dilated using a 3.0-mm Quantum balloon at 14  atmospheres.  The remainder of the stents, including the region of overlap  was then post dilated using a 3.5 NT Monorail balloon at 14 atmospheres  distally, 18 atmospheres in the region of overlap, and 18 atmospheres  throughout the proximal portion of the stent.  Thus, the distal most stent  was post dilated to 3.2 mm and the more proximal portions of the stent  dilated to 3.9 mm.  Angiograms demonstrated no residual stenosis and TIMI-3  flow to the distal vessel.  Intravascular ultrasound was then performed  after the administration of 200 mcg  of intracoronary nitroglycerin.  This  confirmed the stent to be well opposed throughout its length, and well  expanded.   Attention was then turned to the circumflex as part of the CETP study.  A  BMW wire was advanced into the distal portion of the first obtuse marginal  branch.  Then 200 mcg of intracoronary nitroglycerin were then administered.  The IVUS catheter was advanced into the first obtuse marginal branch.  Automatic pullback was performed.  Final angiograms demonstrated no change  in the circumflex vessel after this procedure.   Finally, attention was turned to the RCA.  A 7-French JR4 guide with side  holes was advanced over a wire and engaged in the ostium of the RCA.  A luge  wire was advanced into the PDA.  Attempt was then made to directly stent the  vessel using a 3.0 x 28-mm Taxus stent.  However, this could not be  delivered into the lesion due to the severity of the stenosis.  Therefore,  the lesion was predilated using a 2.5 x 20-mm Quantum at  12 atmospheres.  The stent was then delivered without difficulty and deployed at 14  atmospheres.  The entirety of the stent was then post dilated using a 3.25-  mm PowerSail at 14 atmospheres distally and 16 atmospheres proximally.  Final angiograms demonstrated no residual stenosis and TIMI-3 flow to the  distal vessel.   FINDINGS:  1. LV:  118/2/16.  EF:  65% without regional wall motion abnormalities.  2. No aortic stenosis or mitral regurgitation.  3. LAD:  Ostial 20% stenosis which may simply represent angulation of the     vessel as opposed to obstructive plaque.  4. LAD:  The LAD is a large vessel, wrapping around the apex of the heart.     It gives rise to a large intramyocardial septal perforator that parallels     the LAD for much of its course.  Just after the takeoff of this large     septal perforator, there was a 70% stenosis of the LAD over a long     segment.  There is then a focal 95% stenosis more distally.  The entirety     of the region encompassing both these stenoses was then stented using     drug-eluding stents as detailed above.  5. Circumflex:  The circumflex is a large, codominant vessel.  It gives rise     to 3 obtuse marginal branches in the PDA.  There is a 50% stenosis in the     distal portion of the first obtuse marginal branch.  There is an 80%     stenosis in the proximal portion of the small third obtuse marginal     branch.  6. RCA:  The RCA is a moderate-sized, codominant vessel.  There is a 50%     stenosis of the proximal vessel.  In the mid vessel there are serial 90%     and 60% stenoses.  The ostium of the PDA has a 50% stenosis.  The     __________ of the mid vessel were successfully stented using a drug-     eluding stent as described in detail above.   IMPRESSION AND RECOMMENDATIONS:  1. Severe stenosis of the left anterior descending with serial 70% and 95%    stenoses resulting in TIMI-2 flow to the distal vessel.  This was stented      using overlapping drug-eluding stents, resulting in no residual stenosis  and TIMI-3 flow to the distal vessel.  2. Severe stenosis of the mid right coronary artery treated with drug-     eluding stent resulting in no residual stenosis and TIMI-3 flow.  3. An 80% stenosis of a small third obtuse marginal branch.  There were also     several 50% stenoses in both the circumflex and right coronary artery     territories.  These will be managed medically.   Since the patient has drug-eluding stents in place, he should be continued  on Plavix for a minimum of 6 months.  Aspirin will be continued  indefinitely.  The sheath will remain in place until ACT is less than 150  seconds, and then removed.  Eptifibatide will be continued for 18 hours.                                               Salvadore Farber, M.D. [facility]    WED/MEDQ  D:  02/23/2003  T:  02/26/2003  Job:  [id number]   cc:   [name], M.D. [facility]   Pricilla Riffle, M.D. [facility]

## 2011-04-10 NOTE — H&P (Signed)
NAME:  Charles Robinson, Charles Robinson NO.:  [id number]   MEDICAL RECORD NO.:  [id number]                   PATIENT TYPE:  INP   LOCATION:  1824                                 FACILITY:  [facility]   PHYSICIAN:  Pricilla Riffle, M.D. [facility]             DATE OF BIRTH:  [birth year]   DATE OF ADMISSION:  02/22/2003  DATE OF DISCHARGE:                                HISTORY & PHYSICAL   HISTORY OF PRESENT ILLNESS:  The patient is a 65 year old gentleman who was  referred by [name], M.D. [facility] to the ER for evaluation of chest  pain.   The patient has no known history of CAD.  Over the past week, he has noted  substernal chest burning with exertion.  Sunday it occurred when he was  mowing. He stopped and the episode eased off.  Yesterday, he was walking and  developed some burning, slowed down and the pain went away.   The patient denies any symptoms at rest. He denies any PND, leg symptoms,  and no palpitations.   The patient does have a history of remote atrial fibrillation (2000) he had  an echocardiogram done that showed mild LVH.  He converted to normal sinus  rhythm and took Coumadin for only a short time.   ALLERGIES:  No known drug allergies.   MEDICATIONS:  1. Hydrochlorothiazide 25 mg daily.  2. Lipitor 10 mg daily.  3. Multivitamin daily.  4. Vitamin E daily.  5. Selenium daily.  6. Vitamin C daily.  7. Beta Carotene daily.  8. Saw Palmetto daily.  9. Accupril 10 b.i.d.  10.      Cardizem 240 daily.  11.      Cardura two daily.  12.      Aspirin 81 mg daily.  13.      Glucosamine and Coenzyme Q10 daily.   PAST MEDICAL HISTORY:  1. Hypertension treated for the past three to four years.  2. Dyslipidemia treated x5 years.  3. History of benign prostatic hypertrophy.  4. History of atrial fibrillation.   Echo in 2000 showed normal LV function, mild LVH.   SOCIAL HISTORY:  The patient lives in [place]. He is married. He smokes  cigars rarely and  drinks occasionally.   FAMILY HISTORY:  Father died in his sleep at age 48.  One brother with  prostate cancer. Grandfather with MI in his 4s.   REVIEW OF SYSTEMS:  Overall has reviewed, negative except as noted.   PHYSICAL EXAMINATION:  GENERAL: The patient is in no acute distress.  VITAL SIGNS:  Blood pressure 152/78, pulse 70, weight not take, temperature  98.6.  NECK:  JVP is normal.  LUNGS:  Clear.  HEART:  Regular rate and rhythm, normal S1 and S2.  No S3, S4, or murmurs.  ABDOMEN:  Benign.  Obese.  EXTREMITIES:  No edema.  2+ pulses, no bruits.  12-lead EKG; normal sinus rhythm with a rate of 74 beats per minute.   Labs are pending.   IMPRESSION:  The patient is a 65 year old gentleman with a history  consistent with exertional angina.  I talked to the patient and his wife and  would recommend cardiac catheterization to define anatomy. Will place him on  Lovenox and low dose beta blocker, continue other medicines.   PLAN:  Cardiac catheterization tomorrow.  Will check fasting lipid panel in  a.m.   With his history of atrial fibrillation, he does have mild LVH. Will need to  reaffirm what his rhythm was. He may indeed need to be on Coumadin.                                               Pricilla Riffle, M.D. [facility]    PVR/MEDQ  D:  02/22/2003  T:  02/23/2003  Job:  [id number]

## 2011-04-10 NOTE — Discharge Summary (Signed)
NAME:  Charles Robinson, SCARFO NO.:  [id number]   MEDICAL RECORD NO.:  [id number]                   PATIENT TYPE:  INP   LOCATION:  6529                                 FACILITY:  [facility]   PHYSICIAN:  Duke Salvia, M.D. [facility]           DATE OF BIRTH:  06-Nov-1946   DATE OF ADMISSION:  02/22/2003  DATE OF DISCHARGE:  02/24/2003                           DISCHARGE SUMMARY - REFERRING   HISTORY OF PRESENT ILLNESS:  The patient is a 65 year old white male who  presents to the emergency room complaining of substernal chest burning  associated with heavy exertion over the preceding week. He denies any  associated nausea, vomiting, dyspnea, diaphoresis, palpitations, or syncope.  He denies PND, orthopnea, or edema, or pleuritic chest discomfort.  He feels  that rest makes the discomfort better.  He has not had any discomfort with  ADLs.  He saw his primary physician on the day of admission and was sent to  the emergency room. EKG did not show any acute changes and in the emergency  room, he was currently pain-free.  His history is notable for  hyperlipidemia, hypertension, BPH, and paroxysmal atrial fibrillation in  2000.  He converted to normal sinus rhythm and he did take Coumadin for a  short time which was discontinued. He smokes cigars.   LABORATORY DATA:  Admission H&H was 15.5 and 46.4, normal indices, platelets  208, WBC 5.0. PT 13.4, PTT 31, sodium 139, potassium 3.8, BUN 19, creatinine  1.3, glucose 107, normal LFT's.  Total bilirubin slightly elevated at 1.5.  Homocysteine was elevated at 14.67.  CK totals and troponins were negative  x3. Fasting lipids showed a total cholesterol of 192, triglycerides 150, HDL  39, and LDL 123.  TSH was 1.851.  Subsequent chemistries and hematology was  unremarkable.  Urinalysis was notable for moderate amount of hemoglobin.  EKG showed normal sinus rhythm, normal axis, LAR, nonspecific T wave  changes.   HOSPITAL  COURSE:  The patient as admitted to [facility] overnight.  He did not have any further discomfort.  Enzymes and EKG's were negative for  myocardial infarction.  On February 23, 2003, Dr. Samule Ohm performed cardiac  catheterization.  According to his progress note, he had tangent left main,  50% OM1, 80% OM3, 70% proximal/95% mid LAD, 50% proximal RCA, 90%/60% mid  RCA, 50% distal RCA involving the PDA. Ef was 65% without wall motion  abnormalities, for AS for mitral regurgitation.  Dr. Samule Ohm performed  stenting, placing two Cypher stents to the LAD lesion and a Taxis stent to  the mid RCA lesions.  He recommended Plavix for at least six months and  aspirin indefinitely. He is maintained on Integrelin for 18 hours.  Post  procedure, he did have a brief episode of chest discomfort which resolved  quickly.  EKG's and post procedure enzymes were negative for changes.  Dr.  Samule Ohm felt  that he could be discharged home the following morning.  On  April 3, Dr. Graciela Husbands saw the patient in review, he felt that we should  increase his Lipitor. With his continuing low HDL, elevated LDL, and  borderline triglycerides.  Dr. Graciela Husbands also felt that with his history of  paroxysmal atrial fibrillation, he should be on Coumadin longterm.  Dr.  Graciela Husbands also noted that he will currently hold any addition of a beta blocker  since he has not had any prior MI and he does not have any LV dysfunction.  He felt that Dr. Samule Ohm should decide clearly in regards to a beta blocker  versus a calcium channel blocker as well the Coumadin issue.   DISCHARGE DIAGNOSES:  1. Unstable angina.  2. Coronary artery disease, status post angioplasty stenting as previously     described.  3. Hyperlipidemia, history of as previously.   DISPOSITION:  He is discharged home. He is asked to continue his home  medications which include hydrochlorothiazide 25 mg daily, Accupril 10 mg  b.i.d., Cardizem CD 240 daily, Cardura 2 mg daily,  nitroglycerin as needed.  Multivitamin daily, vitamin E 400 International Units daily, Selenium 100  International Units daily, vitamin C 500 mg daily, betacarotene daily, Sow  Palmetto daily, Glucosamine, Coenzyme Q10 daily.  His new medications  include Plavix 75 mg daily for six months, coated aspirin 325 mg  indefinitely, Lipitor 40 mg q.h.s., and folic acid 1 mg daily.  He was  advised no lifting, driving, sexual activity, or heavy exertion until  Monday.  Maintain low fat, salt, cholesterol diet.  If he has any problems  with his catheterization site, he was asked to call us.  Research will  follow up in regards to CETT study. He was asked to call [phone] on Monday  to arrange a one-week appointment with a P.A. and a four to six-week  appointment with Dr. Samule Ohm.  It is noted at the time of follow-up, since  his Lipitor was increased, he will need fasting lipids and LFT's in  approximately six weeks.  Dr. Graciela Husbands would like Dr. Samule Ohm to address the  issue of longterm Coumadin therapy in regards to his history of PAF and the  issue of beta blocker versus calcium channel blocker as well.     Joellyn Rued, P.A. [facility]                    Duke Salvia, M.D. [facility]    EW/MEDQ  D:  02/24/2003  T:  02/26/2003  Job:  [id number]   cc:   [name], M.D. [facility]   Salvadore Farber, M.D. [facility]

## 2011-05-13 ENCOUNTER — Ambulatory Visit
Admission: RE | Admit: 2011-05-13 | Discharge: 2011-05-13 | Disposition: A | Discharge status: Home / Self Care | Payer: Medicare Other | Source: Ambulatory Visit | Attending: Cardiology | Admitting: Cardiology

## 2011-05-13 ENCOUNTER — Other Ambulatory Visit: Payer: Self-pay | Admitting: Cardiology

## 2011-05-13 DIAGNOSIS — R0602 Shortness of breath: Secondary | ICD-10-CM

## 2011-05-19 ENCOUNTER — Ambulatory Visit (HOSPITAL_COMMUNITY)
Admission: RE | Admit: 2011-05-19 | Discharge: 2011-05-20 | Disposition: A | Discharge status: Home / Self Care | Payer: Medicare Other | Source: Ambulatory Visit | Attending: Cardiology | Admitting: Cardiology

## 2011-05-19 DIAGNOSIS — I4891 Unspecified atrial fibrillation: Secondary | ICD-10-CM | POA: Insufficient documentation

## 2011-05-19 DIAGNOSIS — E785 Hyperlipidemia, unspecified: Secondary | ICD-10-CM | POA: Insufficient documentation

## 2011-05-19 DIAGNOSIS — I10 Essential (primary) hypertension: Secondary | ICD-10-CM | POA: Insufficient documentation

## 2011-05-19 DIAGNOSIS — Z7901 Long term (current) use of anticoagulants: Secondary | ICD-10-CM | POA: Insufficient documentation

## 2011-05-19 DIAGNOSIS — E876 Hypokalemia: Secondary | ICD-10-CM | POA: Insufficient documentation

## 2011-05-19 DIAGNOSIS — I2510 Atherosclerotic heart disease of native coronary artery without angina pectoris: Secondary | ICD-10-CM | POA: Insufficient documentation

## 2011-05-19 DIAGNOSIS — Z9861 Coronary angioplasty status: Secondary | ICD-10-CM | POA: Insufficient documentation

## 2011-05-19 DIAGNOSIS — R9439 Abnormal result of other cardiovascular function study: Secondary | ICD-10-CM | POA: Insufficient documentation

## 2011-05-19 LAB — PROTIME-INR
INR: 1.21 (ref 0.00–1.49)
Prothrombin Time: 15.6 seconds — ABNORMAL HIGH (ref 11.6–15.2)

## 2011-05-19 LAB — POCT ACTIVATED CLOTTING TIME: Activated Clotting Time: 468 seconds

## 2011-05-20 LAB — CARDIAC PANEL(CRET KIN+CKTOT+MB+TROPI)
CK, MB: 3.4 ng/mL (ref 0.3–4.0)
CK, MB: 3.7 ng/mL (ref 0.3–4.0)
Relative Index: 1.3 (ref 0.0–2.5)
Relative Index: 1.6 (ref 0.0–2.5)
Total CK: 254 U/L — ABNORMAL HIGH (ref 7–232)
Total CK: 226 U/L (ref 7–232)
Troponin I: 0.37 ng/mL (ref <0.30)
Troponin I: 0.34 ng/mL (ref <0.30)

## 2011-05-20 LAB — CBC
HCT: 39.7 % (ref 39.0–52.0)
Hemoglobin: 13.9 g/dL (ref 13.0–17.0)
MCH: 30.9 pg (ref 26.0–34.0)
MCHC: 35 g/dL (ref 30.0–36.0)
MCV: 88.2 fL (ref 78.0–100.0)
Platelets: 121 10*3/uL — ABNORMAL LOW (ref 150–400)
RBC: 4.5 MIL/uL (ref 4.22–5.81)
RDW: 13.3 % (ref 11.5–15.5)
WBC: 3.5 10*3/uL — ABNORMAL LOW (ref 4.0–10.5)

## 2011-05-20 LAB — BASIC METABOLIC PANEL
BUN: 17 mg/dL (ref 6–23)
CO2: 28 mEq/L (ref 19–32)
Calcium: 8.4 mg/dL (ref 8.4–10.5)
Chloride: 104 mEq/L (ref 96–112)
Creatinine, Ser: 1.11 mg/dL (ref 0.50–1.35)
GFR calc Af Amer: >60 mL/min (ref >60)
GFR calc non Af Amer: >60 mL/min (ref >60)
Glucose, Bld: 94 mg/dL (ref 70–99)
Potassium: 3.4 mEq/L — ABNORMAL LOW (ref 3.5–5.1)
Sodium: 139 mEq/L (ref 135–145)

## 2011-05-21 NOTE — Cardiovascular Report (Signed)
NAMELORNE, WINKELS NO.:  [id number]  MEDICAL RECORD NO.:  [id number]  LOCATION:  6524                         FACILITY:  [facility]  PHYSICIAN:  Thereasa Solo. Andra Matsuo, M.D. DATE OF BIRTH:  Feb 04, 1946  DATE OF PROCEDURE:  05/19/2011 DATE OF DISCHARGE:                           CARDIAC CATHETERIZATION   INDICATIONS FOR TEST:  This 65 year old male had had stents placed to his LAD and right coronary artery in 2004 by Dr. Samule Ohm.  He had a routine nuclear study on May 2012, that showed inferior ischemia with a 55% ejection fraction.  This was a change on his nuclear study and he was brought in for elective cardiac catheterization.  He is asymptomatic.  After obtaining informed consent, the patient was prepped and draped in the usual sterile fashion exposing the right groin.  Following a local anesthetic with 1% Xylocaine, the Seldinger technique was employed and a 5-French introducer sheath was placed in the right femoral artery. Attempts at passing the catheter through the iliac bifurcation was difficult.  I finally had to use a Glidewire and a right coronary catheter to negotiate this area and after that used long wire exchange technique.  The right coronary artery required a Noto catheter.  HEMODYNAMIC MONITORING:  Central aortic pressure was 110/66.  TOTAL CONTRAST USED:  130 mL.  His heart rate was sinus to sinus bradycardia with a rate as low as 44 at one point but he was not symptomatic and this was not treated.  CORONARY ANGIOGRAMS: 1. Right coronary artery.  The right coronary artery appeared to be a     small vessel.  It was occluded in its midportion which was just     proximal to the previously placed stent.  There appeared to be some     skipped collaterals with filling of the distal right coronary     artery.  However, the PDA and posterolateral vessels were     visualized via left-to-right collaterals. 2. Left main.  The left main was short and  bifurcated. 3. Circumflex.  The circumflex was a codominant vessel.  It was large     in diameter.  It gave rise to a large distal second OM and several     posterolateral vessels all of which were free of significant     disease.  The first OM, however, was a moderate-sized vessel and     towards the distal portion of it at the bifurcation into a superior     and inferior branch which was subtotaled.  Both of the branches     appeared to be relatively small in diameter and were under filled.     There was only TIMI 1 flow through this.  1. LAD.  The LAD had a stent in its proximal portion and it was widely     patent.  There was mild irregularities in the mid and distal LAD.     The first diagonal had less than 50% ostial narrowing.  Because of the issues at the bifurcation, a hand injection was performed.  There was mild calcification seen on fluoroscopy but there was no restriction of any flow.  Despite this, I  still had marked difficulty when it came to passing catheters through this area.  PCI to the OM system was then undertaken.  He was given Angiomax and had an appropriate ACT.  I was finally able to pass a 6-French Voda 4  into the ascending aorta.  This cannulated the ostium better than the other guide catheters that I had tried.  Once this was in place, I put a short Luge wire down the superior and one down the inferior branches of this OM system.  Plans were to do angioplasty only but when the balloon was placed on the wire, the table moved, the wires were pulled back and I had to start over.  45 minutes to an hour later, I was finally able to reposition both wires into the branches.  The superior branch was addressed first.  A 2.0 x 15 Emerge balloon was used, two inflations 10 x 44 and 13 x 50.  The wire was left in place, the balloon was pulled back and then placed down into the inferior limb through the previously placed Luge wire. Angioplasty to this with a 2.0 x 15  Emerge balloon 14 x 44 seconds and 16 x 30 seconds was performed.  After both the vessels had been angioplastied, flow to these had increased and it was apparent that the inferior vessel was a larger of the two and had a larger area of distribution.  With this in mind, arrangements were made for stenting.  The patient has a history of paroxysmal atrial fibrillation and had been cardioverted less than a year ago and because of this I decided to use non drug-eluting stents because of the concern about Plavix and Coumadin.  A 2.25 x 23 mini Vision stent was made ready and placed through the guide catheter down to the distal portion of the OM and well into the inferior limb.  The area that had involved the bifurcation and the ostium of both of the vessels that were subtotaled was well covered and both the ostium of these vessels had been angioplastied.  The mini Vision was deployed at 14 atmospheres for 16 seconds with a final inflation being 16 atmospheres for 30 seconds.  There was good distal flow and no dissection.  Postdilatation was accomplished with a  Trek 2.5 x 15 balloon a single inflation of 15 atmospheres for 35 seconds. The subtotaled area that had been present initially that extended into both of the branches now appeared to be widely patent with the inferior limb showing brisk TIMI 3 flow which has improved from TIMI 1 flow.  The superior branch which had been subtotaled now was approximately 60% narrowed with brisk TIMI 3 flow.  With this in mind, I did not go after the ostium trying to go through the stent and do repeat angioplasties.  The patient will need to be on Plavix for approximately 3 months.  I plan to restart him on Coumadin in 24 hours.  Because of the high volume of contrast (230 mL), I plan to check a BMP in the morning.  He will need an appointment to see me in about a week.  Total procedure time was almost 3 hours.           ______________________________ Thereasa Solo Hurschel Paynter, M.D.     ABL/MEDQ  D:  05/19/2011  T:  05/20/2011  Job:  [id number]  cc:   [name], MD  Electronically Signed by Julieanne Manson M.D. on 05/21/2011 03:23:41 PM

## 2011-05-25 NOTE — Discharge Summary (Signed)
NAMECAYMAN, KIELBASA NO.:  [id number]  MEDICAL RECORD NO.:  [id number]  LOCATION:  6524                         FACILITY:  [facility]  PHYSICIAN:  Nicki Guadalajara, M.D.     DATE OF BIRTH:  1946/11/22  DATE OF ADMISSION:  05/19/2011 DATE OF DISCHARGE:  05/20/2011                              DISCHARGE SUMMARY   DISCHARGE DIAGNOSES: 1. Coronary artery disease, recent positive nuclear showing inferior     ischemia.  Prior left anterior descending Cypher stents and Taxus     stent to the right coronary artery, status post left heart cath     this admission with percutaneous coronary intervention of the     obtuse marginal with a Vision stent.  Also mildly positive     troponin, decreasing. 2. History of paroxysmal atrial fibrillation, on Coumadin. 3. Hypertension. 4. Hyperlipidemia. 5. Hypokalemia which is repleted with 10 mEq potassium.  HOSPITAL COURSE:  Mr. Caliendo is a 65 year old Caucasian male with a history of coronary artery disease with overlapping stents placed in the LAD and a Taxus stent placed in the RCA in 2004 with most recent nuclear stress test on Apr 07, 2011, showed inferior ischemia with normal ejection fraction.  He also has a history of paroxysmal atrial fibrillation, had cardioversion on June 24, 2010.  His history also includes hypertension and dyslipidemia.  He was set up for left heart cath as an outpatient for January 18, 2011.  The right coronary artery was a small vessel that was occluded in the midportion which was just proximal to the previously placed stent with some skipped collaterals filling from the distal right coronary artery.  Left main was short and bifurcated.  OM was moderate-sized vessel which was subtotal.  PCI was completed on this vessel with a non drug-eluting stent.  The morning of 27, the patient had no complaints.  His troponin which peaked at 0.37 was now 0.34.  He has been seen by cardiac rehab and was seen by  Dr. Tresa Endo who felt he is ready for discharge home.  DISCHARGE LABORATORY DATA:  WBC is 3.5, hemoglobin 13.9, hematocrit 39.7, and platelets 121.  Sodium 139, potassium 3.4, chloride 104, carbon dioxide 28, BUN 17, creatinine 1.11, and glucose 94.  Calcium 8.4.  CK-MB 3.4 and troponin 0.37.  STUDIES/PROCEDURES:  Left heart cath, May 19, 2011, angiographic results:  Right coronary artery appeared to be small vessel, was occluded in midportion which was just proximal to the previously placed stent.  There appeared to be some skipped collaterals with filling of the distal right coronary artery.  However, the PDA, posterior vessels were visualized via left-to-right collaterals.  Left main was short and bifurcated.  Circumflex was dominant vessel.  It was large in diameter. It gave rise to a large distal second OM and several posterolateral vessels, all of which were free of significant disease.  The first OM was a moderate-sized vessel and towards distal portion of the bifurcation into the superior and inferior branch were subtotaled.  Both of the branches appeared to be relatively small diameter and were underfilled.  There is only TIMI 1 flow through this.  LAD that extends proximal  portion was widely patent.  There was moderate irregularities in the mid and distal LAD.  The first diagonal had less than 50% ostial narrowing.  PCI was completed in the OM system first with angioplasty and then with a Mini Vision nondrug-eluting stent.  DISCHARGE MEDICATIONS: 1. Plavix 75 mg 1 tablet by mouth daily with meal. 2. Nitroglycerin sublingual 0.4 mg 1 tablet under the tongue every 5     minutes up to 3 doses for chest pain. 3. Aspirin enteric-coated 81 mg 2 tablets by mouth every evening. 4. Cardizem CD 240 mg 1 capsule by mouth every morning. 5. CoQ10 200 mg 1 tablet by mouth every morning. 6. Coumadin 5 mg 1 tablet on Monday, Wednesday, Friday, and Sunday and     then one and half tablet by  Tuesdays, Thursdays, and Saturday. 7. Doxazosin 4 mg 1 tablet by mouth daily. 8. Fish oil 1000 mg 1 capsule by mouth twice daily. 9. Hydrochlorothiazide 25 mg 1 tablet by mouth every morning. 10.Metoprolol tartrate 25 mg 1 tablet by mouth twice daily. 11.Multivitamin over-the-counter 1 tablet by mouth every morning. 12.Niaspan 1000 mg 2 tablets by mouth daily at bedtime. 13.Vytorin 10/40 mg 1 tablet by mouth daily at bedtime.  DISPOSITION:  Mr. Castrejon will be discharged home in stable condition. Recommended to increase his activity slowly.  He may shower and bathe. No lifting or driving for 3 days.  He is to eat a low-sodium, heart- healthy diet.  If cath site becomes red, painful, swollen or discharges fluid or pus, he will call our office.  He will follow up with Dr. Clarene Duke next week and our office will call him with appointment time.  He is also scheduled for outpatient rehab.    ______________________________ Wilburt Finlay, PA   ______________________________ Nicki Guadalajara, M.D.    BH/MEDQ  D:  05/20/2011  T:  05/20/2011  Job:  [id number]  cc:   Dr. Clarene Duke  Electronically Signed by Wilburt Finlay PA on 05/21/2011 03:09:13 PM Electronically Signed by Nicki Guadalajara M.D. on 05/25/2011 04:30:02 PM

## 2011-06-24 ENCOUNTER — Encounter (HOSPITAL_COMMUNITY)
Admission: RE | Admit: 2011-06-24 | Discharge: 2011-06-24 | Disposition: A | Discharge status: Home / Self Care | Payer: Medicare Other | Source: Ambulatory Visit | Attending: Cardiology | Admitting: Cardiology

## 2011-06-24 DIAGNOSIS — I10 Essential (primary) hypertension: Secondary | ICD-10-CM | POA: Insufficient documentation

## 2011-06-24 DIAGNOSIS — I4891 Unspecified atrial fibrillation: Secondary | ICD-10-CM | POA: Insufficient documentation

## 2011-06-24 DIAGNOSIS — Z7901 Long term (current) use of anticoagulants: Secondary | ICD-10-CM | POA: Insufficient documentation

## 2011-06-24 DIAGNOSIS — R9439 Abnormal result of other cardiovascular function study: Secondary | ICD-10-CM | POA: Insufficient documentation

## 2011-06-24 DIAGNOSIS — Z5189 Encounter for other specified aftercare: Secondary | ICD-10-CM | POA: Insufficient documentation

## 2011-06-24 DIAGNOSIS — E785 Hyperlipidemia, unspecified: Secondary | ICD-10-CM | POA: Insufficient documentation

## 2011-06-24 DIAGNOSIS — E876 Hypokalemia: Secondary | ICD-10-CM | POA: Insufficient documentation

## 2011-06-24 DIAGNOSIS — Z9861 Coronary angioplasty status: Secondary | ICD-10-CM | POA: Insufficient documentation

## 2011-06-24 DIAGNOSIS — I2510 Atherosclerotic heart disease of native coronary artery without angina pectoris: Secondary | ICD-10-CM | POA: Insufficient documentation

## 2011-06-26 ENCOUNTER — Encounter (HOSPITAL_COMMUNITY): Payer: Medicare Other

## 2011-06-28 DIAGNOSIS — I2510 Atherosclerotic heart disease of native coronary artery without angina pectoris: Secondary | ICD-10-CM | POA: Diagnosis present

## 2011-06-29 ENCOUNTER — Encounter (HOSPITAL_COMMUNITY): Payer: Medicare Other

## 2011-07-01 ENCOUNTER — Encounter (HOSPITAL_COMMUNITY): Payer: Medicare Other

## 2011-07-03 ENCOUNTER — Encounter (HOSPITAL_COMMUNITY): Payer: Medicare Other

## 2011-07-06 ENCOUNTER — Encounter (HOSPITAL_COMMUNITY): Payer: Medicare Other

## 2011-07-08 ENCOUNTER — Encounter (HOSPITAL_COMMUNITY): Payer: Medicare Other

## 2011-07-08 ENCOUNTER — Other Ambulatory Visit: Payer: Self-pay | Admitting: Family Medicine

## 2011-07-10 ENCOUNTER — Encounter (HOSPITAL_COMMUNITY): Payer: Medicare Other

## 2011-07-13 ENCOUNTER — Encounter (HOSPITAL_COMMUNITY): Payer: Medicare Other

## 2011-07-15 ENCOUNTER — Encounter (HOSPITAL_COMMUNITY): Payer: Medicare Other

## 2011-07-17 ENCOUNTER — Encounter (HOSPITAL_COMMUNITY): Payer: Medicare Other

## 2011-07-20 ENCOUNTER — Encounter (HOSPITAL_COMMUNITY): Payer: Medicare Other

## 2011-07-22 ENCOUNTER — Encounter (HOSPITAL_COMMUNITY): Payer: Medicare Other

## 2011-07-24 ENCOUNTER — Encounter (HOSPITAL_COMMUNITY): Payer: Medicare Other

## 2011-07-25 HISTORY — PX: DOPPLER ECHOCARDIOGRAPHY: SHX263

## 2011-07-27 ENCOUNTER — Encounter (HOSPITAL_COMMUNITY): Payer: Medicare Other

## 2011-07-29 ENCOUNTER — Encounter (HOSPITAL_COMMUNITY): Payer: Medicare Other | Attending: Cardiology

## 2011-07-29 DIAGNOSIS — I4891 Unspecified atrial fibrillation: Secondary | ICD-10-CM | POA: Insufficient documentation

## 2011-07-29 DIAGNOSIS — Z5189 Encounter for other specified aftercare: Secondary | ICD-10-CM | POA: Insufficient documentation

## 2011-07-29 DIAGNOSIS — E785 Hyperlipidemia, unspecified: Secondary | ICD-10-CM | POA: Insufficient documentation

## 2011-07-29 DIAGNOSIS — Z7901 Long term (current) use of anticoagulants: Secondary | ICD-10-CM | POA: Insufficient documentation

## 2011-07-29 DIAGNOSIS — I10 Essential (primary) hypertension: Secondary | ICD-10-CM | POA: Insufficient documentation

## 2011-07-29 DIAGNOSIS — R9439 Abnormal result of other cardiovascular function study: Secondary | ICD-10-CM | POA: Insufficient documentation

## 2011-07-29 DIAGNOSIS — Z9861 Coronary angioplasty status: Secondary | ICD-10-CM | POA: Insufficient documentation

## 2011-07-29 DIAGNOSIS — E876 Hypokalemia: Secondary | ICD-10-CM | POA: Insufficient documentation

## 2011-07-29 DIAGNOSIS — I2510 Atherosclerotic heart disease of native coronary artery without angina pectoris: Secondary | ICD-10-CM | POA: Insufficient documentation

## 2011-07-31 ENCOUNTER — Encounter (HOSPITAL_COMMUNITY): Payer: Medicare Other

## 2011-08-03 ENCOUNTER — Encounter (HOSPITAL_COMMUNITY): Payer: Medicare Other

## 2011-08-05 ENCOUNTER — Encounter (HOSPITAL_COMMUNITY): Payer: Medicare Other

## 2011-08-07 ENCOUNTER — Encounter (HOSPITAL_COMMUNITY): Payer: Medicare Other

## 2011-08-10 ENCOUNTER — Encounter (HOSPITAL_COMMUNITY): Payer: Medicare Other

## 2011-08-12 ENCOUNTER — Encounter (HOSPITAL_COMMUNITY): Payer: Medicare Other

## 2011-08-14 ENCOUNTER — Inpatient Hospital Stay (HOSPITAL_COMMUNITY)
Admission: RE | Admit: 2011-08-14 | Discharge: 2011-08-21 | DRG: 246 | Disposition: A | Discharge status: Home / Self Care | Payer: Medicare Other | Source: Ambulatory Visit | Attending: Cardiology | Admitting: Cardiology

## 2011-08-14 ENCOUNTER — Encounter (HOSPITAL_COMMUNITY): Payer: Medicare Other

## 2011-08-14 DIAGNOSIS — Y849 Medical procedure, unspecified as the cause of abnormal reaction of the patient, or of later complication, without mention of misadventure at the time of the procedure: Secondary | ICD-10-CM | POA: Diagnosis present

## 2011-08-14 DIAGNOSIS — I2510 Atherosclerotic heart disease of native coronary artery without angina pectoris: Secondary | ICD-10-CM | POA: Diagnosis present

## 2011-08-14 DIAGNOSIS — E785 Hyperlipidemia, unspecified: Secondary | ICD-10-CM | POA: Diagnosis present

## 2011-08-14 DIAGNOSIS — Z9861 Coronary angioplasty status: Secondary | ICD-10-CM

## 2011-08-14 DIAGNOSIS — E876 Hypokalemia: Secondary | ICD-10-CM | POA: Diagnosis not present

## 2011-08-14 DIAGNOSIS — I214 Non-ST elevation (NSTEMI) myocardial infarction: Secondary | ICD-10-CM | POA: Diagnosis not present

## 2011-08-14 DIAGNOSIS — I2542 Coronary artery dissection: Secondary | ICD-10-CM | POA: Diagnosis not present

## 2011-08-14 DIAGNOSIS — I4892 Unspecified atrial flutter: Secondary | ICD-10-CM | POA: Diagnosis not present

## 2011-08-14 DIAGNOSIS — T82897A Other specified complication of cardiac prosthetic devices, implants and grafts, initial encounter: Principal | ICD-10-CM | POA: Diagnosis present

## 2011-08-14 DIAGNOSIS — K5900 Constipation, unspecified: Secondary | ICD-10-CM | POA: Diagnosis not present

## 2011-08-14 DIAGNOSIS — I4891 Unspecified atrial fibrillation: Secondary | ICD-10-CM | POA: Diagnosis not present

## 2011-08-14 DIAGNOSIS — I319 Disease of pericardium, unspecified: Secondary | ICD-10-CM | POA: Diagnosis not present

## 2011-08-14 LAB — CBC
HCT: 40.8 % (ref 39.0–52.0)
HCT: 40 % (ref 39.0–52.0)
Hemoglobin: 14.6 g/dL (ref 13.0–17.0)
Hemoglobin: 14 g/dL (ref 13.0–17.0)
MCH: 31.6 pg (ref 26.0–34.0)
MCH: 31 pg (ref 26.0–34.0)
MCHC: 35.8 g/dL (ref 30.0–36.0)
MCHC: 35 g/dL (ref 30.0–36.0)
MCV: 88.3 fL (ref 78.0–100.0)
MCV: 88.5 fL (ref 78.0–100.0)
Platelets: 127 10*3/uL — ABNORMAL LOW (ref 150–400)
Platelets: 128 10*3/uL — ABNORMAL LOW (ref 150–400)
RBC: 4.62 MIL/uL (ref 4.22–5.81)
RBC: 4.52 MIL/uL (ref 4.22–5.81)
RDW: 13 % (ref 11.5–15.5)
RDW: 13 % (ref 11.5–15.5)
WBC: 3.7 10*3/uL — ABNORMAL LOW (ref 4.0–10.5)
WBC: 3.9 10*3/uL — ABNORMAL LOW (ref 4.0–10.5)

## 2011-08-14 LAB — PROTIME-INR
INR: 1.64 — ABNORMAL HIGH (ref 0.00–1.49)
Prothrombin Time: 19.7 seconds — ABNORMAL HIGH (ref 11.6–15.2)

## 2011-08-14 LAB — CK TOTAL AND CKMB (NOT AT ARMC)
CK, MB: 5 ng/mL — ABNORMAL HIGH (ref 0.3–4.0)
Relative Index: 2.2 (ref 0.0–2.5)
Total CK: 229 U/L (ref 7–232)

## 2011-08-14 LAB — TROPONIN I: Troponin I: 1.95 ng/mL (ref <0.30)

## 2011-08-14 LAB — POCT ACTIVATED CLOTTING TIME: Activated Clotting Time: 617 seconds

## 2011-08-14 LAB — MRSA PCR SCREENING: MRSA by PCR: NEGATIVE

## 2011-08-14 LAB — PLATELET INHIBITION P2Y12: Platelet Function  P2Y12: 116 [PRU] — ABNORMAL LOW (ref 194–418)

## 2011-08-15 LAB — CK TOTAL AND CKMB (NOT AT ARMC)
CK, MB: 6.1 ng/mL (ref 0.3–4.0)
CK, MB: 6.2 ng/mL (ref 0.3–4.0)
Relative Index: 3.1 — ABNORMAL HIGH (ref 0.0–2.5)
Relative Index: 3.1 — ABNORMAL HIGH (ref 0.0–2.5)
Total CK: 200 U/L (ref 7–232)
Total CK: 203 U/L (ref 7–232)

## 2011-08-15 LAB — BASIC METABOLIC PANEL
BUN: 15 mg/dL (ref 6–23)
CO2: 29 mEq/L (ref 19–32)
Calcium: 8.4 mg/dL (ref 8.4–10.5)
Chloride: 105 mEq/L (ref 96–112)
Creatinine, Ser: 1.11 mg/dL (ref 0.50–1.35)
GFR calc Af Amer: >60 mL/min (ref >60)
GFR calc non Af Amer: >60 mL/min (ref >60)
Glucose, Bld: 96 mg/dL (ref 70–99)
Potassium: 3.3 mEq/L — ABNORMAL LOW (ref 3.5–5.1)
Sodium: 140 mEq/L (ref 135–145)

## 2011-08-15 LAB — CBC
HCT: 36.4 % — ABNORMAL LOW (ref 39.0–52.0)
Hemoglobin: 12.7 g/dL — ABNORMAL LOW (ref 13.0–17.0)
MCH: 30.8 pg (ref 26.0–34.0)
MCHC: 34.9 g/dL (ref 30.0–36.0)
MCV: 88.1 fL (ref 78.0–100.0)
Platelets: 103 10*3/uL — ABNORMAL LOW (ref 150–400)
RBC: 4.13 MIL/uL — ABNORMAL LOW (ref 4.22–5.81)
RDW: 13 % (ref 11.5–15.5)
WBC: 5.7 10*3/uL (ref 4.0–10.5)

## 2011-08-15 LAB — TROPONIN I
Troponin I: 3.14 ng/mL (ref <0.30)
Troponin I: 2.06 ng/mL (ref <0.30)

## 2011-08-16 DIAGNOSIS — Z9289 Personal history of other medical treatment: Secondary | ICD-10-CM

## 2011-08-16 HISTORY — DX: Personal history of other medical treatment: Z92.89

## 2011-08-16 LAB — URINALYSIS, MICROSCOPIC ONLY
Bilirubin Urine: NEGATIVE
Glucose, UA: NEGATIVE mg/dL
Ketones, ur: NEGATIVE mg/dL
Leukocytes, UA: NEGATIVE
Nitrite: NEGATIVE
Protein, ur: NEGATIVE mg/dL
Specific Gravity, Urine: 1.009 (ref 1.005–1.030)
Urobilinogen, UA: 1 mg/dL (ref 0.0–1.0)
pH: 6 (ref 5.0–8.0)

## 2011-08-16 LAB — CARDIAC PANEL(CRET KIN+CKTOT+MB+TROPI)
CK, MB: 2.4 ng/mL (ref 0.3–4.0)
Relative Index: 2.1 (ref 0.0–2.5)
Total CK: 114 U/L (ref 7–232)
Troponin I: 0.78 ng/mL (ref <0.30)

## 2011-08-16 LAB — BASIC METABOLIC PANEL
BUN: 11 mg/dL (ref 6–23)
CO2: 30 mEq/L (ref 19–32)
Calcium: 8.8 mg/dL (ref 8.4–10.5)
Chloride: 101 mEq/L (ref 96–112)
Creatinine, Ser: 1.01 mg/dL (ref 0.50–1.35)
GFR calc Af Amer: >60 mL/min (ref >60)
GFR calc non Af Amer: >60 mL/min (ref >60)
Glucose, Bld: 115 mg/dL — ABNORMAL HIGH (ref 70–99)
Potassium: 3.7 mEq/L (ref 3.5–5.1)
Sodium: 139 mEq/L (ref 135–145)

## 2011-08-16 LAB — CBC
HCT: 40 % (ref 39.0–52.0)
Hemoglobin: 13.9 g/dL (ref 13.0–17.0)
MCH: 30.8 pg (ref 26.0–34.0)
MCHC: 34.8 g/dL (ref 30.0–36.0)
MCV: 88.5 fL (ref 78.0–100.0)
Platelets: 111 10*3/uL — ABNORMAL LOW (ref 150–400)
RBC: 4.52 MIL/uL (ref 4.22–5.81)
RDW: 12.8 % (ref 11.5–15.5)
WBC: 4.7 10*3/uL (ref 4.0–10.5)

## 2011-08-16 NOTE — Cardiovascular Report (Signed)
Charles Robinson, SIDES NO.:  [id number]  MEDICAL RECORD NO.:  [id number]  LOCATION:  2911                         FACILITY:  [facility]  PHYSICIAN:  Landry Corporal, MDDATE OF BIRTH:  1946/08/01  DATE OF PROCEDURE:  08/14/2011 DATE OF DISCHARGE:                           CARDIAC CATHETERIZATION   CARDIAC CATHETERIZATION AND COMPLEX PERCUTANEOUS CORONARY INTERVENTION REPORT  PRIMARY CARDIOLOGIST:  Gaspar Garbe B. Little, MD  PERFORMING PHYSICIAN:  Landry Corporal, MD  PROCEDURES PERFORMED: 1. Native coronary angiography. 2. Intracoronary nitroglycerin injection. 3. Successful percutaneous coronary intervention on first obtuse     marginal branch with placement of two overlapping Integrity     Resolute drug-eluting stents, 2.25 mm x 26 mm overlapped with a 2.5     mm x 30 mm in the ostium. 4. Successful percutaneous intervention of the atrioventricular groove     circumflex to repair a dissection with placement of three overlapping      Integrity Resolute Drug Eluting Stents: most distally with a 2.5 x 14 mm --      postdilated to 2.6 mm, 3.0 mm x 30 mm -- post-dilated to  3.12mm,     overlapped with a 3.5 mm x 22 mm proximally -- post-dilated to 3.48mm proximally.  INDICATIONS: 1. Recurrent unstable angina status post recent PCI. 2. High-risk nuclear stress test with hypotension during stress and     lateral wall large area of ischemia.  The risks, benefits, alternatives, and indications of the procedure were explained to the patient in detail after he was seen by Dr. Julieanne Manson in clinic.  He was then referred here for diagnostic coronary angiography with possible percutaneous intervention.  Informed consent was obtained with a signed form placed on the chart.  The patient was brought to the cardiac catheterization lab in a fasting state.  PROCEDURE:  The patient was prepped and draped in the usual sterile fashion for radial artery access after an  excellent modified Allen's with plethysmography demonstrated great collateral flow through the right ulnar artery.  The right wrist was anesthetized using 1% subcutaneous lidocaine and the right radial artery was then accessed using the Seldinger technique with placement of initially a 5-French sheath.  The sheath was aspirated and flushed and then infiltrated with a total of 10 mL of standard radial artery cocktail.  Then, a 5-French TIG4.0 catheter was advanced over a VersaCore wire into the ascending aorta.  Once I was able to enter the ascending aorta, the patient was administered 5000 units of intravenous heparin.  The TIG catheter was then used to engage the left coronary artery and several angiographic views of this coronary artery were obtained and demonstrated in-stent thrombosis of the recently placed stent.  I then redirected the catheter into the right coronary artery and I took one image of the known diffusely diseased small nondominant and mid occluded right coronary artery.  INITIAL ANGIOGRAPHIC FINDINGS: 1. The left main is a very short, very large-caliber vessel making it     very difficult to get guide support.  It is at least a 4.5-5.0     vessel, bifurcates normally into an LAD and circumflex. 2. The LAD is a  large-caliber vessel.  It gives rise to a large first     septal perforator.  It basically gives rise to a very proximal     bifurcating septal trunk, which covers most of the septal     distribution.  There are several small diagonal branches.  The LAD     itself is extremely tortuous.  There is a long stented region in     the midportion.  There is, maybe, 20% in-stent stenosis in the very     proximal portion of the stented region, but brisk TIMI-3 flow     distally down to the apex. 3. Circumflex is again a large-caliber vessel.  It gives rise to a     couple of small branches and then bifurcates into a large first     obtuse marginal and then  atrioventricular groove.  The obtuse     marginal branch at the point of the stent placement and initial     angiography revealed subtotal occlusion of this stent with the TIMI-     1 flow to the distal portion of the vessel with multiple     bifurcating lesions at the initial intervention and then the parent     vessel itself bifurcates again distally.  In the interim between     taking the initial diagnostic images and the guide catheter images,     there was progression of this occlusion.  The bifurcation segment between the obtuse marginal and this atrioventricular groove circumflex has a hazy filling defect just beyond the bifurcation and then goes down into the interventricular groove, gives rise to another small obtuse marginal branch and then bifurcates into posterolateral and posterior descending arteries.  The remainder of the posterior interventricular groove artery demonstrates just diffuse disease distally and very small caliber vessels, very tortuous and very diffusely diseased.  There is a roughly 60% lesion right at the bifurcation between the follow-on left posterolateral branch and the posterior descending artery.  Please note that the left coronary system has extremely large caliber vessels requiring large amounts of contrast injection to completely opacify the vessels, hence the significant amount of contrast was used.  CATHETERIZATION STATISTICS: 1. The patient received a total of 125 mcg of IV Fentanyl and 6 mg of IV Versed. 2. A total of roughly 520 mL of contrast was used for the entire     procedure. 3. Radial cocktail was 10 mL total, diluted in normal saline with 5 mg     of verapamil, 2 mL of 1% lidocaine, and 400 mcg of nitroglycerin. 4. After the completion of the procedure, I checked a PTY12 inhibitor     level, but I also loaded him with prasugrel as he had been on     Plavix.  HEMODYNAMIC RESULTS:  The patient's central aortic pressure was  measured during the entire procedure, I did not cross the aortic valve as he just had evaluation of his left ventricular function, and intended to do so after the intervention but after the long procedure, I decided to forego this portion. Eventually hemodynamics:  The aortic pressure has raised from 140/70 to 170/81 when discomfort was noted.  INTERVENTIONAL PROCEDURE: The 5-French sheath was exchanged for a 6-French sheath. Guide:  6-French [id number] guiding catheter was advanced over the long exchange     safety J-wire and used to engage the left coronary artery.     a.     Initial guidewire/Prowater down the obtuse marginal branch.  b.     Additionally, an Intuition wire was used to try to advance      down through the previously placed stent into the branching      portion of the obtuse marginal, which was unsuccessful and      therefore was used as a buddy wire down the obtuse marginal. 1. Predilatation balloon 2.25 mm x 20 mm Emerge Monorail:     a.     Two inflations at 10 atmospheres for 60 seconds.     b.     At this time, due to the tortuosity and the angulation of      the takeoff of the obtuse marginal branch with the hazy filling      defect just beyond the ostium, a decision was made to use a      GuideLiner supporting device to allow for the stent to pass beyond      the area in question and this was also due to having difficulty      with guide support.  Using the GuideLiner, the stent was easily      passed beyond this section into the obtuse marginal. 2. Stent, Resolute Integrity 2.25 mm x 26 mm.     a.     Initial inflation 10 atmospheres for 80 seconds.     b.     At 12 atmospheres for 30 seconds.     c.     After initial inflation of the stent following scout      angiography revealed evidence of a dissection that appear to begin      from the bifurcation of the atrioventricular groove circumflex and      the obtuse marginal.  It went just proximal to the  bifurcation      down to the bifurcation of the atrioventricular groove circumflex      beyond the distal bifurcation.  At this time, the stent was      deployed in its current location.  The GuideLiner was removed and      follow-on angiography did reveal presence of a dissection.  At      this time, CT Surgery was contacted for possible emergency bypass      surgery.  I was able to talk on the phone with Dr. Clarene Duke and Dr.      Tresa Endo at [facility] who reviewed the films      and a decision was made to go ahead and proceed with attempting to      tack down the dissection in the vessels.  I then turned my      direction to the atrioventricular groove circumflex.  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 1.  Predilatation with TREK 3.0 mm x 20 mm balloon.     a.     First inflation at 12 atmospheres for 33 seconds very distal      near the crux of the vessel.     b.     At 12 atmospheres for 60 seconds in the mid.     c.     At 8 atmospheres for 45 seconds right at the bifurcation. The distal limb was then removed completely and following angiography revealed that there was very little residual staining from the dissection in the proximal portion of the obtuse marginal.  The patient remained hemodynamically stable with no loss of blood pressure or heart rate and was not noticing symptoms.  The attention was then returned to the obtuse marginal where a decision was made to stent the residual portion of this vessel back to the ostium of the bifurcation. 1. Second stent in the obtuse marginal:  Integrity Resolute 2.5 mm x     30 mm:     a. At 12 atmospheres for 60 seconds.     b. The balloon advanced to overlapping segment at 10 atmospheres for     50 seconds.       c. Then pulled back to the ostium at 8 atmospheres for 30      seconds.     d. Scout angiography revealed excellent stent apposition with brisk     flow down the distal portions of this vessel including the     previously jailed superior branch of the obtuse marginal.  All     three distal branches were briskly filled.  Attention was then returned to the atrioventricular groove circumflex dissection, now stable: STENTS: Distal to Proximal 1. Integrity Resolute 2.5 mm x 40 mm (distal just beyond the     bifurcation from posterior descending artery to posterolateral     artery).     a.     At 6 atmospheres for 52 seconds bringing the total size up      to 2.5 mm.  The balloon was then reinflated.     b.     At 8 atmospheres for 46 seconds:  Final diameter 2.6 mm      distally. 2. 3.0 mm x 30 mm overlapping with the stent 1:     a.     At 12 atmospheres for 60 seconds. 3. Proximal at the overlapping segment, Integrity Resolute 3.5 mm x 22     mm:     a.     At 12 atmospheres for 26 seconds.     b.     At this time, the wire from the obtuse marginal was removed      to avoid jailing.     c.     Reinflation with stent balloon at 12 atmospheres for 60      seconds for full deployment. POST-DILATION 1. Postdilatation balloon distal, [place] TREK 3.25 mm x 20 mm:     a.     At 6 atmospheres for 60 seconds.     b.     At 18 atmospheres for 60 seconds in the midportion.     c.     At 16 atmospheres for 60 seconds at the overlap of stents 2      and 3. 2. Predilatation proximal:  [place] TREK 3.75 mm x 50 mm:     a.     At 8 atmospheres for 60 seconds at distal overlap between      stents 2 and 3.  Final diameter 3.68 mm.     b.     At 11 atmospheres 60 seconds at the overlap of stents 2 and      3.  Final diameter 3.75.     c.     At very proximal portion of the stent at the bifurcation, 14      atmospheres for 60 seconds.  Final diameter 3.8 mm.  The balloon was then removed completely out of the body.  Followup angiographic images revealed excellent stent  apposition with no residual evidence of  dissection and TIMI-3 flow to distal vessels.  The distal vessels did remain heavily diseased, but with flow distally noted to be preserved.  Additionally, it was noted now left-to-right collaterals to the nondominant right coronary.  I confirmed with Dr. Clarene Duke on the phone who was observing the case with images on line.  We conferred that final angiographic images revealed excellent results with difficult procedure requiring hasty intervention in a bifurcation dissection lesion.  The wire was then removed and with one final image confirming excellent flow throughout.  The guide catheter was then removed completely out of the body over wire.  The sheath was removed in the cath lab with placement of a TR band at 15 mm of air.  The patient remained hemodynamically stable during the entire portion of the procedure.  Several injections of intracoronary nitroglycerin were administered during the procedure with the most final one being right before the final images.  The patient's family was updated at the time of noticing the dissection and was kept apprised of the procedure.  I did go out and discuss the entire procedure with the family.  Estimated blood loss was just simply due to wire exchanges and catheter exchanges and samples, 30-40 mL including 20 mL sent for labs.  The patient remained stable before, during, and after the procedure. Complication was the dissection as described, but no residual complications at the completion of the case.  He did have some mild chest discomfort and was, therefore, placed on a nitroglycerin drip once his blood pressures returned to baseline.  The patient noted some mild bleeding from the urethra that was evaluated in the holding area.  FINAL IMPRESSION: 1. 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 2. Successful repair of dissection to the atrioventricular groove     circumflex to the point of the bifurcation.  This was treated with     three overlapping drug-eluting stents:  From distal to proximal,     Integrity Resolute 2.5 mm x 40 mm - postdilated to 2.6 mm,     Integrity Resolute 3.0 mm x 30 mm - postdilated to 3.5 mm,     Integrity Resolute 3.5 mm x 22 mm - postdilated to 3.8 mm more     proximally in a tapering fashion. 3. Urethral bleeding not noted after placement of a Foley catheter     with no blood noted in the Foley bag after initial flush.  PLAN: 1. The patient will be admitted to the intensive care unit for     monitoring.  I will start him on nitroglycerin drip once his blood     pressures normalize after being at the holding area.  The initial     plan was to continue with the Angiomax drip; however, due to his     hematuria, I decided to stop the Angiomax. 2. Loaded with Prasugrel 60 mg in the holding area due to the recent     in-stent thrombosis. 3. PTY12 inhibitor level was checked. 4. The patient will remain off warfarin for his  atrial fibrillation     until the time estimated by Dr. Clarene Duke. 5. We will cycle cardiac enzymes as he is likely to have type 4     myocardial infarction from this procedure; however, the patient did     not have chest pain at the time of the noted dissection.  He did     note the pain associated with the balloon inflations only. 6. Anticipate, if he has a stable recovery, discharge later on either     Sunday or Monday, but we would like to monitor him for at least a     couple of days in the hospital as he likely had this unstable event     due to this procedure.  Dr. Clarene Duke and  the patient's family were well apprised of the entire procedure as well as the complication and the repair.  The patient was sent to the ICU in stable condition.          ______________________________ Landry Corporal, MD     DWH/MEDQ  D:  08/14/2011  T:  08/15/2011  Job:  [id number]  cc:   Thereasa Solo. Little, M.D. Second [facility] Cardiac [facility]  Electronically Signed by Bryan Lemma MD on 08/16/2011 12:23:25 PM

## 2011-08-17 ENCOUNTER — Encounter (HOSPITAL_COMMUNITY): Payer: Medicare Other

## 2011-08-18 LAB — BASIC METABOLIC PANEL
BUN: 21 mg/dL (ref 6–23)
CO2: 30 mEq/L (ref 19–32)
Calcium: 9.1 mg/dL (ref 8.4–10.5)
Chloride: 94 mEq/L — ABNORMAL LOW (ref 96–112)
Creatinine, Ser: 1.35 mg/dL (ref 0.50–1.35)
GFR calc Af Amer: >60 mL/min (ref >60)
GFR calc non Af Amer: 53 mL/min — ABNORMAL LOW (ref >60)
Glucose, Bld: 118 mg/dL — ABNORMAL HIGH (ref 70–99)
Potassium: 3.3 mEq/L — ABNORMAL LOW (ref 3.5–5.1)
Sodium: 133 mEq/L — ABNORMAL LOW (ref 135–145)

## 2011-08-18 LAB — HEPARIN LEVEL (UNFRACTIONATED)
Heparin Unfractionated: 0.33 IU/mL (ref 0.30–0.70)
Heparin Unfractionated: 0.25 IU/mL — ABNORMAL LOW (ref 0.30–0.70)
Heparin Unfractionated: 0.37 IU/mL (ref 0.30–0.70)

## 2011-08-18 LAB — MAGNESIUM: Magnesium: 2.1 mg/dL (ref 1.5–2.5)

## 2011-08-19 ENCOUNTER — Encounter (HOSPITAL_COMMUNITY): Payer: Medicare Other

## 2011-08-19 LAB — BASIC METABOLIC PANEL
BUN: 22 mg/dL (ref 6–23)
CO2: 30 mEq/L (ref 19–32)
Calcium: 9.1 mg/dL (ref 8.4–10.5)
Chloride: 97 mEq/L (ref 96–112)
Creatinine, Ser: 1.32 mg/dL (ref 0.50–1.35)
GFR calc Af Amer: >60 mL/min (ref >60)
GFR calc non Af Amer: 54 mL/min — ABNORMAL LOW (ref >60)
Glucose, Bld: 113 mg/dL — ABNORMAL HIGH (ref 70–99)
Potassium: 4 mEq/L (ref 3.5–5.1)
Sodium: 136 mEq/L (ref 135–145)

## 2011-08-20 DIAGNOSIS — I4892 Unspecified atrial flutter: Secondary | ICD-10-CM

## 2011-08-20 HISTORY — DX: Unspecified atrial flutter: I48.92

## 2011-08-20 LAB — PROTIME-INR
INR: 1.1 (ref 0.00–1.49)
Prothrombin Time: 14.4 seconds (ref 11.6–15.2)

## 2011-08-20 LAB — HEPARIN LEVEL (UNFRACTIONATED): Heparin Unfractionated: 0.33 IU/mL (ref 0.30–0.70)

## 2011-08-20 LAB — CBC
HCT: 37.4 % — ABNORMAL LOW (ref 39.0–52.0)
Hemoglobin: 13.3 g/dL (ref 13.0–17.0)
MCH: 31.4 pg (ref 26.0–34.0)
MCHC: 35.6 g/dL (ref 30.0–36.0)
MCV: 88.4 fL (ref 78.0–100.0)
Platelets: 144 10*3/uL — ABNORMAL LOW (ref 150–400)
RBC: 4.23 MIL/uL (ref 4.22–5.81)
RDW: 12.6 % (ref 11.5–15.5)
WBC: 4.5 10*3/uL (ref 4.0–10.5)

## 2011-08-21 ENCOUNTER — Encounter (HOSPITAL_COMMUNITY): Payer: Medicare Other

## 2011-08-21 LAB — CBC
HCT: 35.7 % — ABNORMAL LOW (ref 39.0–52.0)
Hemoglobin: 12.2 g/dL — ABNORMAL LOW (ref 13.0–17.0)
MCH: 30.7 pg (ref 26.0–34.0)
MCHC: 34.2 g/dL (ref 30.0–36.0)
MCV: 89.7 fL (ref 78.0–100.0)
Platelets: 144 10*3/uL — ABNORMAL LOW (ref 150–400)
RBC: 3.98 MIL/uL — ABNORMAL LOW (ref 4.22–5.81)
RDW: 12.8 % (ref 11.5–15.5)
WBC: 4 10*3/uL (ref 4.0–10.5)

## 2011-08-21 LAB — PROTIME-INR
INR: 1.15 (ref 0.00–1.49)
Prothrombin Time: 14.9 seconds (ref 11.6–15.2)

## 2011-08-21 LAB — HEPARIN LEVEL (UNFRACTIONATED): Heparin Unfractionated: 0.47 IU/mL (ref 0.30–0.70)

## 2011-08-24 ENCOUNTER — Encounter (HOSPITAL_COMMUNITY): Payer: Medicare Other

## 2011-08-26 ENCOUNTER — Encounter (HOSPITAL_COMMUNITY): Payer: Medicare Other

## 2011-08-27 NOTE — Discharge Summary (Signed)
NAMENAHOME, BUBLITZ NO.:  [id number]  MEDICAL RECORD NO.:  [id number]  LOCATION:  2003                         FACILITY:  [facility]  PHYSICIAN:  Landry Corporal, MD DATE OF BIRTH:  [birth year]  DATE OF ADMISSION:  08/14/2011 DATE OF DISCHARGE:  08/21/2011                              DISCHARGE SUMMARY   DISCHARGE DIAGNOSES: 1. Atrial fibrillation/flutter with rapid ventricular response status     post TEE cardioversion on August 20, 2011, currently in sinus     rhythm.  We will continue on p.o. diltiazem and amiodarone which     will be tapered down.  Also on Coumadin and Lovenox bridging. 2. Coronary artery disease status post complex percutaneous coronary     intervention complicated by dissection.  Currently aspirin,     Effient. 3. Non-ST-elevation myocardial infarction for percutaneous coronary     intervention for in-stent thrombosis of a bare-metal stent in the     obtuse marginal vessel with subsequent extensive dissection. 4. Pericardial effusion, resolved. 5. Hypokalemia, resolved.  HOSPITAL COURSE:  Mr. Colleran is a 65 year old Caucasian male with a history of stents being placed in his LAD/RCA.  In May 2012, he had a positive nuclear study, which resulted in catheterization in June 2012 and a complex PCI of the bifurcation lesion in the circumflex OM system. The ongoing circumflex stent in the ostium of the OM was treated with cutting balloon.  He presented to the [facility] office on August 12, 2011, with chest pain x3 days during exertion. He was subsequently set up for stress test, which was markedly positive for severe ischemia involving the lateral wall and hypertension following stress test.  He was set up for left heart catheterization on August 14, 2011.  He also has history of paroxysmal atrial fibrillation, has been on Coumadin and Plavix.  History also includes hypertension and hyperlipidemia.  Heart  catheterization showed in-stent thrombosis of a recently placed bare-metal stent to the first obtuse marginal and the bifurcating lesion status post successful complex percutaneous coronary intervention with placement of 2 overlapping drug- eluting stents.  There was also successful repair of dissection of the atrioventricular groove circumflex to the point of bifurcation.  This was treated with 3 overlapping drug-eluting stents.  On August 16, 2011, the patient had some mild chest pain, which was treated with NSAIDs.  2-D echocardiogram was completed, which showed ejection fraction in the range of 60% to 65%, normal wall motion.  There was moderate concentric hypertrophy.  Grade 1 diastolic dysfunction. Ascending aorta was mildly dilated measuring up to 4.17 cm most distally visualized portion.  Also noted small circumferential, but mostly posterior pericardial effusion with no clear evidence of tamponade physiology.  Cardiac goal has been initiated.  On August 18, 2011, the patient had no complaints of chest pain or shortness of breath, however, was noted pain and converted into atrial fibrillation with rapid ventricular response.  He was started on IV diltiazem at 5 mg an hour which was titrated to 10 mg an hour.  On August 19, 2011, he was hypokalemic.  This was repleted.  He was also noted to be in atrial flutter  at that time.  Arrangements were made for TEE cardioversion. The patient was also restarted on Coumadin.  TEE cardioversion was completed on August 20, 2011, which was successful back in normal sinus rhythm.  IV diltiazem was switched to p.o.  Currently, the patient is in stable condition in normal sinus rhythm, will be discharged home on Lovenox and Coumadin bridging with INR check on Monday and followup with Dr. Clarene Duke.  DISCHARGE LABS:  WBC 4.0, hemoglobin 12.2, hematocrit 35.7, platelets 144.  PT 14.9, INR 1.15.  Sodium 136, potassium 4.0, chloride 97,  carbon dioxide 30, glucose 113, BUN 22, creatinine 1.32, calcium 9.1, troponin peak was 3.4, CK-MB peak of 6.2.  his urinalysis on August 16, 2011, showed moderate blood.  Hemoglobin has been stable.  STUDIES/PROCEDURES: 1. Cardiac catheterization, see cath report for full details. 2. A 2-D echocardiogram on August 16, 2011.  Left ventricular     cavity size is normal, there is mild concentric hypertrophy.  The     systolic function was normal, ejection fraction 60% to 65%, no wall     motion abnormalities.  Grade 1 diastolic dysfunction.  Aortic valve     showed sclerosis without stenosis.  No regurgitation and the     descending aorta was mildly dilated measuring up to 4.17 cm, most     distally visualized portion of the ascending aorta.  Mitral valve     showed calcified annulus, left atrium was moderately dilated, right     ventricle cavity size was normal, wall thickness was normal,     moderate pain was prominent.  Systolic function was normal.  IVC     measures greater than 2.1 cm, but collapses 50% suggesting elevated     RA pressure of 10 mmHg.  CardioNet showed a small circumferential,     but mostly posterior pericardial effusion.  No evidence of     tamponade. 3. Transesophageal echocardiogram August 20, 2011, showed normal LV     cavity size with mild concentric hypertrophy, normal systolic     function and EF 55% to 60%, normal wall motion.  The right atrium     showed no evidence of thrombus in the atrial cavity or appendage. 4. August 20, 2011, successful TEE cardioversion to normal sinus     rhythm.  DISCHARGE MEDICATIONS:  See medical reconciliation.  DISPOSITION:  Mr. Hixon will be discharged home in stable condition. He is recommended to increase his activity slowly.  He is to eat a low sodium, heart-healthy diet.  He will follow up on Monday for an INR check at [facility] and with Dr. Clarene Duke a week later.  He will be discharged  on Lovenox and Coumadin bridge.  He is being provided with Lovenox teaching this morning prior to discharge. He will also receive his first dose.    ______________________________ Wilburt Finlay, PA   ______________________________ Landry Corporal, MD    BH/MEDQ  D:  08/21/2011  T:  08/21/2011  Job:  [id number]  cc:   [name], MD Thereasa Solo Little, M.D.  Electronically Signed by Wilburt Finlay PA on 08/26/2011 03:38:40 PM Electronically Signed by Bryan Lemma MD on 08/27/2011 09:02:04 AM

## 2011-08-28 ENCOUNTER — Encounter (HOSPITAL_COMMUNITY): Payer: Medicare Other

## 2011-08-31 ENCOUNTER — Encounter (HOSPITAL_COMMUNITY): Payer: Medicare Other

## 2011-09-02 ENCOUNTER — Encounter (HOSPITAL_COMMUNITY): Payer: Medicare Other

## 2011-09-04 ENCOUNTER — Encounter (HOSPITAL_COMMUNITY): Payer: Medicare Other

## 2011-09-07 ENCOUNTER — Encounter (HOSPITAL_COMMUNITY): Payer: Medicare Other

## 2011-09-07 ENCOUNTER — Ambulatory Visit (HOSPITAL_COMMUNITY): Payer: Medicare Other

## 2011-09-07 LAB — CBC
HCT: 45.1
Hemoglobin: 15.2
MCHC: 33.7
MCV: 92.2
Platelets: 153
RBC: 4.89
RDW: 13.5
WBC: 4

## 2011-09-07 LAB — BASIC METABOLIC PANEL
BUN: 15
CO2: 29
Calcium: 9.3
Chloride: 105
Creatinine, Ser: 1.1
GFR calc Af Amer: >60
GFR calc non Af Amer: >60
Glucose, Bld: 103 — ABNORMAL HIGH
Potassium: 3.8
Sodium: 140

## 2011-09-09 ENCOUNTER — Encounter (HOSPITAL_COMMUNITY): Payer: Medicare Other

## 2011-09-11 ENCOUNTER — Encounter (HOSPITAL_COMMUNITY): Payer: Medicare Other

## 2011-09-14 ENCOUNTER — Encounter (HOSPITAL_COMMUNITY): Payer: Medicare Other

## 2011-09-16 ENCOUNTER — Encounter (HOSPITAL_COMMUNITY): Payer: Medicare Other

## 2011-09-18 ENCOUNTER — Encounter (HOSPITAL_COMMUNITY): Payer: Medicare Other

## 2011-09-21 ENCOUNTER — Encounter (HOSPITAL_COMMUNITY): Payer: Medicare Other

## 2011-09-23 ENCOUNTER — Encounter (HOSPITAL_COMMUNITY): Payer: Medicare Other

## 2011-09-25 ENCOUNTER — Encounter (HOSPITAL_COMMUNITY): Payer: Medicare Other

## 2011-09-28 ENCOUNTER — Encounter (HOSPITAL_COMMUNITY): Payer: Medicare Other

## 2011-09-30 ENCOUNTER — Encounter (HOSPITAL_COMMUNITY): Payer: Medicare Other

## 2011-10-02 ENCOUNTER — Encounter (HOSPITAL_COMMUNITY): Payer: Medicare Other

## 2011-10-05 ENCOUNTER — Encounter (HOSPITAL_COMMUNITY): Payer: Medicare Other

## 2011-10-07 ENCOUNTER — Encounter (HOSPITAL_COMMUNITY): Payer: Medicare Other

## 2011-10-09 ENCOUNTER — Encounter (HOSPITAL_COMMUNITY): Payer: Medicare Other

## 2011-10-12 ENCOUNTER — Encounter (HOSPITAL_COMMUNITY): Payer: Medicare Other

## 2011-10-12 ENCOUNTER — Encounter (HOSPITAL_COMMUNITY)
Admission: RE | Admit: 2011-10-12 | Discharge: 2011-10-12 | Disposition: A | Discharge status: Home / Self Care | Payer: Medicare Other | Source: Ambulatory Visit | Attending: Cardiology | Admitting: Cardiology

## 2011-10-12 DIAGNOSIS — Z7901 Long term (current) use of anticoagulants: Secondary | ICD-10-CM | POA: Insufficient documentation

## 2011-10-12 DIAGNOSIS — I10 Essential (primary) hypertension: Secondary | ICD-10-CM | POA: Insufficient documentation

## 2011-10-12 DIAGNOSIS — Z9861 Coronary angioplasty status: Secondary | ICD-10-CM | POA: Insufficient documentation

## 2011-10-12 DIAGNOSIS — I2510 Atherosclerotic heart disease of native coronary artery without angina pectoris: Secondary | ICD-10-CM | POA: Insufficient documentation

## 2011-10-12 DIAGNOSIS — I4891 Unspecified atrial fibrillation: Secondary | ICD-10-CM | POA: Insufficient documentation

## 2011-10-12 DIAGNOSIS — E876 Hypokalemia: Secondary | ICD-10-CM | POA: Insufficient documentation

## 2011-10-12 DIAGNOSIS — R9439 Abnormal result of other cardiovascular function study: Secondary | ICD-10-CM | POA: Insufficient documentation

## 2011-10-12 DIAGNOSIS — E785 Hyperlipidemia, unspecified: Secondary | ICD-10-CM | POA: Insufficient documentation

## 2011-10-12 DIAGNOSIS — Z5189 Encounter for other specified aftercare: Secondary | ICD-10-CM | POA: Insufficient documentation

## 2011-10-12 NOTE — Progress Notes (Signed)
Pt returns today for exercise.  Pt with medical absence due to stent placement.  Pt tolerated light exercise with no complaints.  Pt medication list reconciled.

## 2011-10-14 ENCOUNTER — Encounter (HOSPITAL_COMMUNITY)
Admission: RE | Admit: 2011-10-14 | Discharge: 2011-10-14 | Disposition: A | Discharge status: Home / Self Care | Payer: Medicare Other | Source: Ambulatory Visit | Attending: Cardiology | Admitting: Cardiology

## 2011-10-14 ENCOUNTER — Encounter (HOSPITAL_COMMUNITY): Payer: Medicare Other

## 2011-10-16 ENCOUNTER — Encounter (HOSPITAL_COMMUNITY): Payer: Medicare Other

## 2011-10-19 ENCOUNTER — Encounter (HOSPITAL_COMMUNITY)
Admission: RE | Admit: 2011-10-19 | Discharge: 2011-10-19 | Disposition: A | Discharge status: Home / Self Care | Payer: Medicare Other | Source: Ambulatory Visit | Attending: Cardiology | Admitting: Cardiology

## 2011-10-21 ENCOUNTER — Encounter (HOSPITAL_COMMUNITY)
Admission: RE | Admit: 2011-10-21 | Discharge: 2011-10-21 | Disposition: A | Discharge status: Home / Self Care | Payer: Medicare Other | Source: Ambulatory Visit

## 2011-10-23 ENCOUNTER — Encounter (HOSPITAL_COMMUNITY): Admission: RE | Admit: 2011-10-23 | Payer: Medicare Other | Source: Ambulatory Visit

## 2011-10-26 ENCOUNTER — Encounter (HOSPITAL_COMMUNITY)
Admission: RE | Admit: 2011-10-26 | Discharge: 2011-10-26 | Disposition: A | Discharge status: Home / Self Care | Payer: Medicare Other | Source: Ambulatory Visit | Attending: Cardiology | Admitting: Cardiology

## 2011-10-26 DIAGNOSIS — Z7901 Long term (current) use of anticoagulants: Secondary | ICD-10-CM | POA: Insufficient documentation

## 2011-10-26 DIAGNOSIS — Z9861 Coronary angioplasty status: Secondary | ICD-10-CM | POA: Insufficient documentation

## 2011-10-26 DIAGNOSIS — I4891 Unspecified atrial fibrillation: Secondary | ICD-10-CM | POA: Insufficient documentation

## 2011-10-26 DIAGNOSIS — Z5189 Encounter for other specified aftercare: Secondary | ICD-10-CM | POA: Insufficient documentation

## 2011-10-26 DIAGNOSIS — I2510 Atherosclerotic heart disease of native coronary artery without angina pectoris: Secondary | ICD-10-CM | POA: Insufficient documentation

## 2011-10-26 DIAGNOSIS — I10 Essential (primary) hypertension: Secondary | ICD-10-CM | POA: Insufficient documentation

## 2011-10-26 DIAGNOSIS — E876 Hypokalemia: Secondary | ICD-10-CM | POA: Insufficient documentation

## 2011-10-26 DIAGNOSIS — R9439 Abnormal result of other cardiovascular function study: Secondary | ICD-10-CM | POA: Insufficient documentation

## 2011-10-26 DIAGNOSIS — E785 Hyperlipidemia, unspecified: Secondary | ICD-10-CM | POA: Insufficient documentation

## 2011-10-28 ENCOUNTER — Encounter (HOSPITAL_COMMUNITY)
Admission: RE | Admit: 2011-10-28 | Discharge: 2011-10-28 | Disposition: A | Discharge status: Home / Self Care | Payer: Medicare Other | Source: Ambulatory Visit | Attending: Cardiology | Admitting: Cardiology

## 2011-10-30 ENCOUNTER — Encounter (HOSPITAL_COMMUNITY)
Admission: RE | Admit: 2011-10-30 | Discharge: 2011-10-30 | Disposition: A | Discharge status: Home / Self Care | Payer: Medicare Other | Source: Ambulatory Visit | Attending: Cardiology | Admitting: Cardiology

## 2011-11-02 ENCOUNTER — Encounter (HOSPITAL_COMMUNITY)
Admission: RE | Admit: 2011-11-02 | Discharge: 2011-11-02 | Disposition: A | Discharge status: Home / Self Care | Payer: Medicare Other | Source: Ambulatory Visit | Attending: Cardiology | Admitting: Cardiology

## 2011-11-04 ENCOUNTER — Encounter (HOSPITAL_COMMUNITY)
Admission: RE | Admit: 2011-11-04 | Discharge: 2011-11-04 | Disposition: A | Discharge status: Home / Self Care | Payer: Medicare Other | Source: Ambulatory Visit | Attending: Cardiology | Admitting: Cardiology

## 2011-11-06 ENCOUNTER — Encounter (HOSPITAL_COMMUNITY)
Admission: RE | Admit: 2011-11-06 | Discharge: 2011-11-06 | Disposition: A | Discharge status: Home / Self Care | Payer: Medicare Other | Source: Ambulatory Visit | Attending: Cardiology | Admitting: Cardiology

## 2011-11-09 ENCOUNTER — Encounter (HOSPITAL_COMMUNITY)
Admission: RE | Admit: 2011-11-09 | Discharge: 2011-11-09 | Disposition: A | Discharge status: Home / Self Care | Payer: Medicare Other | Source: Ambulatory Visit | Attending: Cardiology | Admitting: Cardiology

## 2011-11-11 ENCOUNTER — Encounter (HOSPITAL_COMMUNITY): Payer: Medicare Other

## 2011-11-13 ENCOUNTER — Encounter (HOSPITAL_COMMUNITY)
Admission: RE | Admit: 2011-11-13 | Discharge: 2011-11-13 | Disposition: A | Discharge status: Home / Self Care | Payer: Medicare Other | Source: Ambulatory Visit | Attending: Cardiology | Admitting: Cardiology

## 2011-11-13 ENCOUNTER — Encounter (HOSPITAL_COMMUNITY): Payer: Medicare Other

## 2011-11-16 ENCOUNTER — Encounter (HOSPITAL_COMMUNITY): Payer: Medicare Other

## 2011-11-18 ENCOUNTER — Encounter (HOSPITAL_COMMUNITY): Payer: Medicare Other

## 2011-11-20 ENCOUNTER — Encounter (HOSPITAL_COMMUNITY): Payer: Medicare Other

## 2011-11-23 ENCOUNTER — Encounter (HOSPITAL_COMMUNITY): Payer: Medicare Other

## 2011-12-01 NOTE — Progress Notes (Signed)
Cardiac Rehabilitation Program Progress Report   Orientation:  06/11/2011 Graduate Date:  11/13/2011 # of sessions completed: 25/36  Cardiologist: Clarene Duke Family MD:  Kizzie Ide Time:  1115  A.  Exercise Program:  Tolerates exercise @ 4.3  METS for 30 minutes, Bike Test Results:  Pre: 1.07 miles and Post: 1.26 miles, Improved functional capacity  17.8 %, No Change  muscular strength   %, Improved  flexibility 14.3 %, Poor attendance due to illness and travel and Discharged to home exercise program.  Anticipated compliance:  good  B.  Mental Health:  Good mental attitude No post QOL scores returned.  Initial QOL scores: Overall 28.73, Health and Functioning 27.43, Socioeconomic 30, Psychological/Spiritual 30, Family 28.80  C.  Education/Instruction/Skills  Accurately checks own pulse.  Rest:  78  Exercise:  RHR +40, Knows THR for exercise, Uses Perceived Exertion Scale and/or Dyspnea Scale and Attended 4/13 education classes  Home exercise given: 06/28/2011  D.  Nutrition/Weight Control/Body Composition:  BMI 32.6 % Body Fat  30.4, Patient has lost 0.8 kg and Evidence of fat weight loss 3.8%  *This section completed by Mickle Plumb, Andres Shad, RD, LDN, CDE  E.  Blood Lipids    Lab Results  Component Value Date   CHOL 146 01/13/2011     Lab Results  Component Value Date   TRIG 99.0 01/13/2011     Lab Results  Component Value Date   HDL 47.30 01/13/2011     Lab Results  Component Value Date   CHOLHDL 3 01/13/2011     No results found for this basename: LDLDIRECT      F.  Lifestyle Changes:  Making positive lifestyle changes  G.  Symptoms noted with exercise:  Asymptomatic  Report Completed By:  Hazle Nordmann   Comments:  Pt did well in program while here progressing from 2.7 METs to 4.3 METs.  He did have a break in his treatment because of two new stents being placed while in program.  Pt plans to continue to exercise by going to the [facility].  At  discharge pt rhythm was sinus. Fabio Pierce, MA, ACSM RCEP

## 2011-12-02 ENCOUNTER — Other Ambulatory Visit: Payer: Self-pay | Admitting: Dermatology

## 2011-12-02 ENCOUNTER — Encounter (HOSPITAL_COMMUNITY)
Admission: RE | Admit: 2011-12-02 | Discharge: 2011-12-02 | Disposition: A | Discharge status: Home / Self Care | Payer: Self-pay | Source: Ambulatory Visit | Attending: Cardiology | Admitting: Cardiology

## 2011-12-02 DIAGNOSIS — Z7901 Long term (current) use of anticoagulants: Secondary | ICD-10-CM | POA: Insufficient documentation

## 2011-12-02 DIAGNOSIS — I4891 Unspecified atrial fibrillation: Secondary | ICD-10-CM | POA: Insufficient documentation

## 2011-12-02 DIAGNOSIS — I2510 Atherosclerotic heart disease of native coronary artery without angina pectoris: Secondary | ICD-10-CM | POA: Insufficient documentation

## 2011-12-02 DIAGNOSIS — R9439 Abnormal result of other cardiovascular function study: Secondary | ICD-10-CM | POA: Insufficient documentation

## 2011-12-02 DIAGNOSIS — E785 Hyperlipidemia, unspecified: Secondary | ICD-10-CM | POA: Insufficient documentation

## 2011-12-02 DIAGNOSIS — I10 Essential (primary) hypertension: Secondary | ICD-10-CM | POA: Insufficient documentation

## 2011-12-02 DIAGNOSIS — Z9861 Coronary angioplasty status: Secondary | ICD-10-CM | POA: Insufficient documentation

## 2011-12-02 DIAGNOSIS — E876 Hypokalemia: Secondary | ICD-10-CM | POA: Insufficient documentation

## 2011-12-02 DIAGNOSIS — Z5189 Encounter for other specified aftercare: Secondary | ICD-10-CM | POA: Insufficient documentation

## 2011-12-02 NOTE — Progress Notes (Signed)
Cardiac Rehabilitation Program Progress Report   Orientation:  06/11/2011 Graduate Date:  11/13/2011 # of sessions completed: 25/36  Cardiologist: Clarene Duke Family MD:  Kizzie Ide Time:  1115  A.  Exercise Program:  Tolerates exercise @ 4.3  METS for 30 minutes, Bike Test Results:  Pre: 1.07 miles and Post: 1.26 miles, Improved functional capacity  17.8 %, No Change  muscular strength   %, Improved  flexibility 14.3 %, Poor attendance due to illness and travel and Discharged to home exercise program.  Anticipated compliance:  good  B.  Mental Health:  Good mental attitude No post QOL scores returned.  Initial QOL scores: Overall 28.73, Health and Functioning 27.43, Socioeconomic 30, Psychological/Spiritual 30, Family 28.80  C.  Education/Instruction/Skills  Accurately checks own pulse.  Rest:  78  Exercise:  RHR +40, Knows THR for exercise, Uses Perceived Exertion Scale and/or Dyspnea Scale and Attended 4/13 education classes  Home exercise given: 06/28/2011  D.  Nutrition/Weight Control/Body Composition:  No post-data available.  Pt following step 1 heart healthy diet on admission.  BMI 32.6 % Body Fat  30.4, Patient has lost 0.8 kg and Evidence of fat weight loss 3.8%  *This section completed by Mickle Plumb, Andres Shad, RD, LDN, CDE  E.  Blood Lipids Lab Results  Component Value Date   CHOL 146 01/13/2011   HDL 47.30 01/13/2011   LDLCALC 79 01/13/2011   TRIG 99.0 01/13/2011   CHOLHDL 3 01/13/2011   F.  Lifestyle Changes:  Making positive lifestyle changes  G.  Symptoms noted with exercise:  Asymptomatic  Report Completed By:  Jacques Earthly   Comments:  Pt did well in program while here progressing from 2.7 METs to 4.3 METs.  He did have a break in his treatment because of two new stents being placed while in program.  Pt plans to continue to exercise by going to the [facility]  At discharge pt rhythm was sinus. Fabio Pierce, MA, ACSM RCEP

## 2011-12-04 ENCOUNTER — Encounter (HOSPITAL_COMMUNITY): Payer: Self-pay

## 2011-12-07 ENCOUNTER — Encounter (HOSPITAL_COMMUNITY): Payer: Self-pay

## 2011-12-07 ENCOUNTER — Encounter (HOSPITAL_COMMUNITY)
Admission: RE | Admit: 2011-12-07 | Discharge: 2011-12-07 | Disposition: A | Discharge status: Home / Self Care | Payer: Self-pay | Source: Ambulatory Visit | Attending: Cardiology | Admitting: Cardiology

## 2011-12-09 ENCOUNTER — Encounter (HOSPITAL_COMMUNITY): Payer: Self-pay

## 2011-12-09 ENCOUNTER — Encounter (HOSPITAL_COMMUNITY)
Admission: RE | Admit: 2011-12-09 | Discharge: 2011-12-09 | Disposition: A | Discharge status: Home / Self Care | Payer: Self-pay | Source: Ambulatory Visit | Attending: Cardiology | Admitting: Cardiology

## 2011-12-11 ENCOUNTER — Encounter (HOSPITAL_COMMUNITY)
Admission: RE | Admit: 2011-12-11 | Discharge: 2011-12-11 | Disposition: A | Discharge status: Home / Self Care | Payer: Self-pay | Source: Ambulatory Visit | Attending: Cardiology | Admitting: Cardiology

## 2011-12-11 ENCOUNTER — Encounter (HOSPITAL_COMMUNITY): Payer: Self-pay

## 2011-12-11 NOTE — Progress Notes (Addendum)
Discharge Progress Report Cardiac Rehab - Agree with assessment  Karlene Lineman RN

## 2011-12-14 ENCOUNTER — Encounter (HOSPITAL_COMMUNITY)
Admission: RE | Admit: 2011-12-14 | Discharge: 2011-12-14 | Disposition: A | Discharge status: Home / Self Care | Payer: Self-pay | Source: Ambulatory Visit | Attending: Cardiology | Admitting: Cardiology

## 2011-12-14 ENCOUNTER — Encounter (HOSPITAL_COMMUNITY): Payer: Self-pay

## 2011-12-15 ENCOUNTER — Other Ambulatory Visit: Payer: Self-pay | Admitting: Dermatology

## 2011-12-16 ENCOUNTER — Encounter (HOSPITAL_COMMUNITY): Payer: Self-pay

## 2011-12-18 ENCOUNTER — Encounter (HOSPITAL_COMMUNITY): Payer: Self-pay

## 2011-12-21 ENCOUNTER — Encounter (HOSPITAL_COMMUNITY): Payer: Self-pay

## 2011-12-23 ENCOUNTER — Encounter (HOSPITAL_COMMUNITY): Payer: Self-pay

## 2011-12-25 ENCOUNTER — Encounter (HOSPITAL_COMMUNITY): Payer: Medicare Other

## 2011-12-28 ENCOUNTER — Other Ambulatory Visit: Payer: Self-pay | Admitting: Family Medicine

## 2011-12-28 ENCOUNTER — Encounter (HOSPITAL_COMMUNITY): Payer: Medicare Other

## 2011-12-30 ENCOUNTER — Encounter (HOSPITAL_COMMUNITY): Payer: Medicare Other

## 2012-01-01 ENCOUNTER — Encounter (HOSPITAL_COMMUNITY): Payer: Medicare Other

## 2012-01-04 ENCOUNTER — Encounter (HOSPITAL_COMMUNITY): Payer: Medicare Other

## 2012-01-06 ENCOUNTER — Encounter (HOSPITAL_COMMUNITY): Payer: Medicare Other

## 2012-01-08 ENCOUNTER — Encounter (HOSPITAL_COMMUNITY): Payer: Medicare Other

## 2012-01-11 ENCOUNTER — Encounter (HOSPITAL_COMMUNITY): Payer: Medicare Other

## 2012-01-13 ENCOUNTER — Encounter (HOSPITAL_COMMUNITY): Payer: Medicare Other

## 2012-01-15 ENCOUNTER — Encounter (HOSPITAL_COMMUNITY): Payer: Medicare Other

## 2012-01-18 ENCOUNTER — Encounter (HOSPITAL_COMMUNITY): Payer: Medicare Other

## 2012-01-20 ENCOUNTER — Encounter (HOSPITAL_COMMUNITY): Payer: Medicare Other

## 2012-01-21 ENCOUNTER — Other Ambulatory Visit: Payer: Self-pay | Admitting: Dermatology

## 2012-01-22 ENCOUNTER — Encounter (HOSPITAL_COMMUNITY): Payer: Medicare Other

## 2012-01-25 ENCOUNTER — Encounter (HOSPITAL_COMMUNITY): Payer: Medicare Other

## 2012-01-27 ENCOUNTER — Encounter (HOSPITAL_COMMUNITY): Payer: Medicare Other

## 2012-01-28 ENCOUNTER — Ambulatory Visit (INDEPENDENT_AMBULATORY_CARE_PROVIDER_SITE_OTHER): Payer: Medicare Other | Admitting: Family Medicine

## 2012-01-28 ENCOUNTER — Encounter: Payer: Self-pay | Admitting: Family Medicine

## 2012-01-28 DIAGNOSIS — I4891 Unspecified atrial fibrillation: Secondary | ICD-10-CM

## 2012-01-28 DIAGNOSIS — I10 Essential (primary) hypertension: Secondary | ICD-10-CM

## 2012-01-28 DIAGNOSIS — I2510 Atherosclerotic heart disease of native coronary artery without angina pectoris: Secondary | ICD-10-CM

## 2012-01-28 DIAGNOSIS — N401 Enlarged prostate with lower urinary tract symptoms: Secondary | ICD-10-CM

## 2012-01-28 DIAGNOSIS — E785 Hyperlipidemia, unspecified: Secondary | ICD-10-CM

## 2012-01-28 LAB — CBC WITH DIFFERENTIAL/PLATELET
Basophils Absolute: 0 10*3/uL (ref 0.0–0.1)
Basophils Relative: 0.5 % (ref 0.0–3.0)
Eosinophils Absolute: 0 10*3/uL (ref 0.0–0.7)
Eosinophils Relative: 1.1 % (ref 0.0–5.0)
HCT: 45.5 % (ref 39.0–52.0)
Hemoglobin: 15.2 g/dL (ref 13.0–17.0)
Lymphocytes Relative: 41.2 % (ref 12.0–46.0)
Lymphs Abs: 1.4 10*3/uL (ref 0.7–4.0)
MCHC: 33.4 g/dL (ref 30.0–36.0)
MCV: 93.6 fl (ref 78.0–100.0)
Monocytes Absolute: 0.4 10*3/uL (ref 0.1–1.0)
Monocytes Relative: 10 % (ref 3.0–12.0)
Neutro Abs: 1.6 10*3/uL (ref 1.4–7.7)
Neutrophils Relative %: 47.2 % (ref 43.0–77.0)
Platelets: 128 10*3/uL — ABNORMAL LOW (ref 150.0–400.0)
RBC: 4.86 Mil/uL (ref 4.22–5.81)
RDW: 14.2 % (ref 11.5–14.6)
WBC: 3.5 10*3/uL — ABNORMAL LOW (ref 4.5–10.5)

## 2012-01-28 LAB — LIPID PANEL
Cholesterol: 149 mg/dL (ref 0–200)
HDL: 60.6 mg/dL (ref >39.00)
LDL Cholesterol: 74 mg/dL (ref 0–99)
Total CHOL/HDL Ratio: 2
Triglycerides: 71 mg/dL (ref 0.0–149.0)
VLDL: 14.2 mg/dL (ref 0.0–40.0)

## 2012-01-28 LAB — POCT URINALYSIS DIPSTICK
Bilirubin, UA: NEGATIVE
Glucose, UA: NEGATIVE
Ketones, UA: NEGATIVE
Leukocytes, UA: NEGATIVE
Nitrite, UA: NEGATIVE
Spec Grav, UA: 1.02
Urobilinogen, UA: 1
pH, UA: 6

## 2012-01-28 LAB — TSH: TSH: 1.63 u[IU]/mL (ref 0.35–5.50)

## 2012-01-28 LAB — BASIC METABOLIC PANEL
BUN: 19 mg/dL (ref 6–23)
CO2: 27 mEq/L (ref 19–32)
Calcium: 9.2 mg/dL (ref 8.4–10.5)
Chloride: 105 mEq/L (ref 96–112)
Creatinine, Ser: 1.4 mg/dL (ref 0.4–1.5)
GFR: 53.12 mL/min — ABNORMAL LOW (ref >60.00)
Glucose, Bld: 96 mg/dL (ref 70–99)
Potassium: 4.3 mEq/L (ref 3.5–5.1)
Sodium: 140 mEq/L (ref 135–145)

## 2012-01-28 LAB — HEPATIC FUNCTION PANEL
ALT: 28 U/L (ref 0–53)
AST: 33 U/L (ref 0–37)
Albumin: 4.1 g/dL (ref 3.5–5.2)
Alkaline Phosphatase: 59 U/L (ref 39–117)
Bilirubin, Direct: 0.1 mg/dL (ref 0.0–0.3)
Total Bilirubin: 1.3 mg/dL — ABNORMAL HIGH (ref 0.3–1.2)
Total Protein: 7 g/dL (ref 6.0–8.3)

## 2012-01-28 MED ORDER — METOPROLOL SUCCINATE ER 50 MG PO TB24: 50.0000 mg | ORAL_TABLET | Freq: Two times a day (BID) | ORAL | Status: DC

## 2012-01-28 MED ORDER — MOMETASONE FUROATE 50 MCG/ACT NA SUSP: 2.0000 | Freq: Every day | NASAL | Status: DC

## 2012-01-28 MED ORDER — DOXAZOSIN MESYLATE 8 MG PO TABS: 8.0000 mg | ORAL_TABLET | Freq: Every day | ORAL | Status: DC

## 2012-01-28 NOTE — Progress Notes (Signed)
  Subjective:    Patient ID: Charles Robinson, male    DOB: 06/15/1946, 66 y.o.   MRN: [id number]  HPI Charles Robinson is a 66 year old married male nonsmoker who comes in today for a Medicare wellness examination because of a history of underlying coronary artery disease, atrial for ablation, hyperlipidemia, hypertension  His medications reviewed in detail and the changes were noted. He had stents in September 2012. He's currently asymptomatic  Also he is seeing a new urologist to start amend finasteride 5 mg daily has helped improve his urinary output and his PSA has dropped.  Cognitive function normal he does all his own finances. He walks on a regular basis, home health safety reviewed no issues identified, no guns in the house, he does have a health care power of attorney and living well.  Vaccinations up-to-date    Review of Systems  Constitutional: Negative.   HENT: Negative.   Eyes: Negative.   Respiratory: Negative.   Cardiovascular: Negative.   Gastrointestinal: Negative.   Genitourinary: Negative.   Musculoskeletal: Negative.   Skin: Negative.   Neurological: Negative.   Hematological: Negative.   Psychiatric/Behavioral: Negative.        Objective:   Physical Exam  Constitutional: He is oriented to person, place, and time. He appears well-developed and well-nourished.  HENT:  Head: Normocephalic and atraumatic.  Right Ear: External ear normal.  Left Ear: External ear normal.  Nose: Nose normal.  Mouth/Throat: Oropharynx is clear and moist.  Eyes: Conjunctivae and EOM are normal. Pupils are equal, round, and reactive to light.  Neck: Normal range of motion. Neck supple. No JVD present. No tracheal deviation present. No thyromegaly present.  Cardiovascular: Normal rate, regular rhythm, normal heart sounds and intact distal pulses.  Exam reveals no gallop and no friction rub.   No murmur heard. Pulmonary/Chest: Effort normal and breath sounds normal. No stridor. No respiratory  distress. He has no wheezes. He has no rales. He exhibits no tenderness.  Abdominal: Soft. Bowel sounds are normal. He exhibits no distension and no mass. There is no tenderness. There is no rebound and no guarding.  Musculoskeletal: Normal range of motion. He exhibits no edema and no tenderness.  Lymphadenopathy:    He has no cervical adenopathy.  Neurological: He is alert and oriented to person, place, and time. He has normal reflexes. No cranial nerve deficit. He exhibits normal muscle tone.  Skin: Skin is warm and dry. No rash noted. No erythema. No pallor.       2 melanomas recently moved from his back by dermatology  Psychiatric: He has a normal mood and affect. His behavior is normal. Judgment and thought content normal.          Assessment & Plan:  Healthy male  Underlying coronary disease currently asymptomatic  History of A. 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

## 2012-01-28 NOTE — Patient Instructions (Signed)
Continue your current medications  Return in one year for general medical exam sooner if any problems

## 2012-01-29 ENCOUNTER — Encounter (HOSPITAL_COMMUNITY): Payer: Medicare Other

## 2012-02-01 ENCOUNTER — Encounter (HOSPITAL_COMMUNITY): Payer: Medicare Other

## 2012-02-03 ENCOUNTER — Encounter (HOSPITAL_COMMUNITY): Payer: Medicare Other

## 2012-02-04 ENCOUNTER — Other Ambulatory Visit: Payer: Self-pay | Admitting: Family Medicine

## 2012-02-04 NOTE — Progress Notes (Signed)
Quick Note:  Pt aware. Pt request a copy be sent to address. Copy mailed. ______

## 2012-02-05 ENCOUNTER — Encounter (HOSPITAL_COMMUNITY): Payer: Medicare Other

## 2012-02-08 ENCOUNTER — Encounter (HOSPITAL_COMMUNITY): Payer: Medicare Other

## 2012-02-10 ENCOUNTER — Encounter (HOSPITAL_COMMUNITY): Payer: Medicare Other

## 2012-02-12 ENCOUNTER — Encounter (HOSPITAL_COMMUNITY): Payer: Medicare Other

## 2012-02-15 ENCOUNTER — Encounter (HOSPITAL_COMMUNITY): Payer: Medicare Other

## 2012-02-17 ENCOUNTER — Encounter (HOSPITAL_COMMUNITY): Payer: Medicare Other

## 2012-02-19 ENCOUNTER — Encounter (HOSPITAL_COMMUNITY): Payer: Medicare Other

## 2012-02-22 ENCOUNTER — Encounter (HOSPITAL_COMMUNITY): Payer: Medicare Other

## 2012-02-24 ENCOUNTER — Encounter (HOSPITAL_COMMUNITY): Payer: Medicare Other

## 2012-02-26 ENCOUNTER — Encounter (HOSPITAL_COMMUNITY): Payer: Medicare Other

## 2012-02-29 ENCOUNTER — Encounter (HOSPITAL_COMMUNITY): Payer: Medicare Other

## 2012-03-02 ENCOUNTER — Encounter (HOSPITAL_COMMUNITY): Payer: Medicare Other

## 2012-03-04 ENCOUNTER — Encounter (HOSPITAL_COMMUNITY): Payer: Medicare Other

## 2012-03-07 ENCOUNTER — Encounter (HOSPITAL_COMMUNITY): Payer: Medicare Other

## 2012-03-07 DIAGNOSIS — R9439 Abnormal result of other cardiovascular function study: Secondary | ICD-10-CM | POA: Diagnosis present

## 2012-03-09 ENCOUNTER — Encounter (HOSPITAL_COMMUNITY): Payer: Medicare Other

## 2012-03-09 ENCOUNTER — Other Ambulatory Visit: Payer: Self-pay | Admitting: Cardiology

## 2012-03-09 ENCOUNTER — Ambulatory Visit
Admission: RE | Admit: 2012-03-09 | Discharge: 2012-03-09 | Disposition: A | Discharge status: Home / Self Care | Payer: Medicare Other | Source: Ambulatory Visit | Attending: Cardiology | Admitting: Cardiology

## 2012-03-09 DIAGNOSIS — R0602 Shortness of breath: Secondary | ICD-10-CM

## 2012-03-10 ENCOUNTER — Encounter (HOSPITAL_COMMUNITY): Payer: Self-pay | Admitting: Pharmacy Technician

## 2012-03-11 ENCOUNTER — Other Ambulatory Visit: Payer: Self-pay | Admitting: Cardiology

## 2012-03-11 ENCOUNTER — Encounter (HOSPITAL_COMMUNITY): Payer: Medicare Other

## 2012-03-14 ENCOUNTER — Encounter (HOSPITAL_COMMUNITY): Payer: Medicare Other

## 2012-03-15 ENCOUNTER — Ambulatory Visit (HOSPITAL_COMMUNITY)
Admission: RE | Admit: 2012-03-15 | Discharge: 2012-03-16 | Disposition: A | Discharge status: Home / Self Care | Payer: Medicare Other | Source: Ambulatory Visit | Attending: Cardiology | Admitting: Cardiology

## 2012-03-15 ENCOUNTER — Encounter (HOSPITAL_COMMUNITY)
Admission: RE | Disposition: A | Discharge status: Home / Self Care | Payer: Self-pay | Source: Ambulatory Visit | Attending: Cardiology

## 2012-03-15 ENCOUNTER — Encounter (HOSPITAL_COMMUNITY): Payer: Self-pay | Admitting: Cardiology

## 2012-03-15 DIAGNOSIS — I10 Essential (primary) hypertension: Secondary | ICD-10-CM | POA: Insufficient documentation

## 2012-03-15 DIAGNOSIS — Y831 Surgical operation with implant of artificial internal device as the cause of abnormal reaction of the patient, or of later complication, without mention of misadventure at the time of the procedure: Secondary | ICD-10-CM | POA: Insufficient documentation

## 2012-03-15 DIAGNOSIS — Z9861 Coronary angioplasty status: Secondary | ICD-10-CM | POA: Diagnosis present

## 2012-03-15 DIAGNOSIS — I2510 Atherosclerotic heart disease of native coronary artery without angina pectoris: Secondary | ICD-10-CM | POA: Insufficient documentation

## 2012-03-15 DIAGNOSIS — Z955 Presence of coronary angioplasty implant and graft: Secondary | ICD-10-CM

## 2012-03-15 DIAGNOSIS — T82897A Other specified complication of cardiac prosthetic devices, implants and grafts, initial encounter: Secondary | ICD-10-CM | POA: Insufficient documentation

## 2012-03-15 DIAGNOSIS — E785 Hyperlipidemia, unspecified: Secondary | ICD-10-CM | POA: Insufficient documentation

## 2012-03-15 DIAGNOSIS — R9439 Abnormal result of other cardiovascular function study: Secondary | ICD-10-CM | POA: Diagnosis present

## 2012-03-15 DIAGNOSIS — I480 Paroxysmal atrial fibrillation: Secondary | ICD-10-CM | POA: Diagnosis not present

## 2012-03-15 DIAGNOSIS — I4891 Unspecified atrial fibrillation: Secondary | ICD-10-CM | POA: Insufficient documentation

## 2012-03-15 HISTORY — DX: Atherosclerotic heart disease of native coronary artery without angina pectoris: I25.10

## 2012-03-15 HISTORY — DX: Malignant (primary) neoplasm, unspecified: C80.1

## 2012-03-15 HISTORY — PX: LEFT HEART CATHETERIZATION WITH CORONARY ANGIOGRAM: SHX5451

## 2012-03-15 HISTORY — PX: CORONARY ANGIOPLASTY WITH STENT PLACEMENT: SHX49

## 2012-03-15 LAB — POCT ACTIVATED CLOTTING TIME: Activated Clotting Time: 645 seconds

## 2012-03-15 LAB — PROTIME-INR
INR: 1.17 (ref 0.00–1.49)
Prothrombin Time: 15.1 seconds (ref 11.6–15.2)

## 2012-03-15 SURGERY — LEFT HEART CATHETERIZATION WITH CORONARY ANGIOGRAM
Anesthesia: LOCAL

## 2012-03-15 MED ORDER — HEPARIN (PORCINE) IN NACL 2-0.9 UNIT/ML-% IJ SOLN
INTRAMUSCULAR | Status: AC
  Filled 2012-03-15: qty 1000

## 2012-03-15 MED ORDER — FLUTICASONE PROPIONATE 50 MCG/ACT NA SUSP
1.0000 | Freq: Every day | NASAL | Status: DC
  Filled 2012-03-15: qty 16

## 2012-03-15 MED ORDER — AMIODARONE HCL 200 MG PO TABS
200.0000 mg | ORAL_TABLET | Freq: Every day | ORAL | Status: DC
  Administered 2012-03-16: 200 mg via ORAL
  Filled 2012-03-15: qty 1

## 2012-03-15 MED ORDER — LIDOCAINE HCL (PF) 1 % IJ SOLN
INTRAMUSCULAR | Status: AC
  Filled 2012-03-15: qty 30

## 2012-03-15 MED ORDER — HEPARIN SODIUM (PORCINE) 1000 UNIT/ML IJ SOLN
INTRAMUSCULAR | Status: AC
  Filled 2012-03-15: qty 1

## 2012-03-15 MED ORDER — MIDAZOLAM HCL 2 MG/2ML IJ SOLN
INTRAMUSCULAR | Status: AC
  Filled 2012-03-15: qty 2

## 2012-03-15 MED ORDER — ALPRAZOLAM 0.25 MG PO TABS: 0.2500 mg | ORAL_TABLET | Freq: Three times a day (TID) | ORAL | Status: DC | PRN

## 2012-03-15 MED ORDER — SODIUM CHLORIDE 0.9 % IJ SOLN: 3.0000 mL | INTRAMUSCULAR | Status: DC | PRN

## 2012-03-15 MED ORDER — DIAZEPAM 5 MG PO TABS
5.0000 mg | ORAL_TABLET | ORAL | Status: AC
  Administered 2012-03-15: 5 mg via ORAL
  Filled 2012-03-15: qty 1

## 2012-03-15 MED ORDER — SODIUM CHLORIDE 0.9 % IV SOLN
0.2500 mg/kg/h | INTRAVENOUS | Status: DC
  Filled 2012-03-15: qty 250

## 2012-03-15 MED ORDER — BIVALIRUDIN 250 MG IV SOLR
INTRAVENOUS | Status: AC
  Filled 2012-03-15: qty 250

## 2012-03-15 MED ORDER — LORAZEPAM 0.5 MG PO TABS
0.5000 mg | ORAL_TABLET | Freq: Every day | ORAL | Status: DC
  Filled 2012-03-15: qty 1

## 2012-03-15 MED ORDER — METOPROLOL SUCCINATE ER 50 MG PO TB24
50.0000 mg | ORAL_TABLET | Freq: Two times a day (BID) | ORAL | Status: DC
  Administered 2012-03-16: 25 mg via ORAL
  Filled 2012-03-15 (×3): qty 1

## 2012-03-15 MED ORDER — MORPHINE SULFATE 2 MG/ML IJ SOLN: 2.0000 mg | INTRAMUSCULAR | Status: DC | PRN

## 2012-03-15 MED ORDER — ASPIRIN EC 81 MG PO TBEC
81.0000 mg | DELAYED_RELEASE_TABLET | Freq: Every day | ORAL | Status: DC
  Administered 2012-03-16: 81 mg via ORAL
  Filled 2012-03-15: qty 1

## 2012-03-15 MED ORDER — TRAMADOL HCL 50 MG PO TABS
50.0000 mg | ORAL_TABLET | Freq: Four times a day (QID) | ORAL | Status: DC | PRN
  Filled 2012-03-15: qty 1

## 2012-03-15 MED ORDER — WARFARIN SODIUM 2.5 MG PO TABS: 2.5000 mg | ORAL_TABLET | Freq: Once | ORAL | Status: DC

## 2012-03-15 MED ORDER — PRASUGREL HCL 10 MG PO TABS
10.0000 mg | ORAL_TABLET | Freq: Every day | ORAL | Status: DC
  Administered 2012-03-16: 10 mg via ORAL
  Filled 2012-03-15: qty 1

## 2012-03-15 MED ORDER — ONDANSETRON HCL 4 MG/2ML IJ SOLN: 4.0000 mg | Freq: Four times a day (QID) | INTRAMUSCULAR | Status: DC | PRN

## 2012-03-15 MED ORDER — SODIUM CHLORIDE 0.9 % IV SOLN
250.0000 mL | INTRAVENOUS | Status: DC
Start: 2012-03-15 — End: 2012-03-15

## 2012-03-15 MED ORDER — SODIUM CHLORIDE 0.9 % IV SOLN
1.0000 mL/kg/h | INTRAVENOUS | Status: AC
  Administered 2012-03-15: 1 mL/kg/h via INTRAVENOUS

## 2012-03-15 MED ORDER — NITROGLYCERIN 0.2 MG/ML ON CALL CATH LAB
INTRAVENOUS | Status: AC
  Filled 2012-03-15: qty 1

## 2012-03-15 MED ORDER — SODIUM CHLORIDE 0.9 % IJ SOLN: 3.0000 mL | Freq: Two times a day (BID) | INTRAMUSCULAR | Status: DC

## 2012-03-15 MED ORDER — ACETAMINOPHEN 325 MG PO TABS: 650.0000 mg | ORAL_TABLET | ORAL | Status: DC | PRN

## 2012-03-15 MED ORDER — WARFARIN - PHARMACIST DOSING INPATIENT: Freq: Every day | Status: DC

## 2012-03-15 MED ORDER — HEPARIN (PORCINE) IN NACL 2-0.9 UNIT/ML-% IJ SOLN
INTRAMUSCULAR | Status: AC
  Filled 2012-03-15: qty 2000

## 2012-03-15 MED ORDER — METOPROLOL SUCCINATE ER 50 MG PO TB24
50.0000 mg | ORAL_TABLET | Freq: Two times a day (BID) | ORAL | Status: DC
  Filled 2012-03-15: qty 1

## 2012-03-15 MED ORDER — DOXAZOSIN MESYLATE 4 MG PO TABS
4.0000 mg | ORAL_TABLET | Freq: Every evening | ORAL | Status: DC
  Administered 2012-03-15: 4 mg via ORAL
  Filled 2012-03-15 (×2): qty 1

## 2012-03-15 MED ORDER — FENTANYL CITRATE 0.05 MG/ML IJ SOLN
INTRAMUSCULAR | Status: AC
  Filled 2012-03-15: qty 2

## 2012-03-15 MED ORDER — FINASTERIDE 5 MG PO TABS
5.0000 mg | ORAL_TABLET | Freq: Every day | ORAL | Status: DC
  Administered 2012-03-16: 5 mg via ORAL
  Filled 2012-03-15: qty 1

## 2012-03-15 MED ORDER — ZOLPIDEM TARTRATE 5 MG PO TABS: 10.0000 mg | ORAL_TABLET | Freq: Every evening | ORAL | Status: DC | PRN

## 2012-03-15 MED ORDER — WARFARIN SODIUM 5 MG PO TABS
5.0000 mg | ORAL_TABLET | Freq: Once | ORAL | Status: AC
  Administered 2012-03-15: 5 mg via ORAL
  Filled 2012-03-15: qty 1

## 2012-03-15 MED ORDER — EZETIMIBE-SIMVASTATIN 10-20 MG PO TABS
1.0000 | ORAL_TABLET | Freq: Every day | ORAL | Status: DC
  Administered 2012-03-15: 1 via ORAL
  Filled 2012-03-15 (×2): qty 1

## 2012-03-15 MED ORDER — SODIUM CHLORIDE 0.9 % IV SOLN
INTRAVENOUS | Status: DC
  Administered 2012-03-15: 08:00:00 via INTRAVENOUS

## 2012-03-15 MED ORDER — DILTIAZEM HCL ER 180 MG PO CP24
180.0000 mg | ORAL_CAPSULE | Freq: Every day | ORAL | Status: DC
  Administered 2012-03-16: 180 mg via ORAL
  Filled 2012-03-15 (×2): qty 1

## 2012-03-15 NOTE — Brief Op Note (Signed)
03/15/2012  11:47 AM  PATIENT:  Charles Robinson  66 y.o. male with extensive stent work to LAD, RCA & LCx referred for catheterization due to abnormal Myoview ST with LCx distribution ischemia.  PRE-OPERATIVE DIAGNOSIS:  abn nuclear ST  POST-OPERATIVE DIAGNOSIS:    Known mid RCA occlusion, Patent LAD stents, Patent LCx stents with ~80% focal ISR just distal to OM, OM proximal stent patent, but there appears to be a non-covered segment between the proximal and mid stent from 9/2-12 followed by ~80-90% ISR in the mid & distal stent.  Well preserved EF ~ 55 %, normal EDP with no WMA.  Successful 2 site PTCA with PTCA and stenting of OM2 (stent at uncovered overlap site with ? Dissection) & PTCA of distal stent ISR followed by PTCA of mid LCx ISR.   PROCEDURE:  Procedure(s) (LRB):  LEFT HEART CATHETERIZATION WITH CORONARY ANGIOGRAM (N/A)  PTCA and stenting of OM2 with Integrity Resolute DES 2.5 mm x 12 mm (post-dilated to 2.75 mm)  Cutting Balloon PTCA of mid LCirc with 3.25 mm x 10 mm non-compliant balloon  SURGEON:  Surgeon(s) and Role:    * Marykay Lex, MD - Primary  PHYSICIAN ASSISTANT:   ANESTHESIA:   local; IV Versed 3 mg, Fentanyl 75 mcg  EBL:   ~20 ml  LOCAL MEDICATIONS USED:  LIDOCAINE  - 2 ml  Medications: Heparin 5500 Units IV;  Angiomax bolus & gtt  Euipment:  Diagnostic - 5Fr R Radial Artery sheath --> 5Fr TIG 4.0 both coronary angiography  PCI: exchange to 6 Fr sheath; Initial Guide - VodaL 21F; Cougar OM, Luge LCx - after Emerge 2.0 x 15 used; guide support lost with attempt to advance 2.5 x 12 mm [place] Quantum -->    Lesion #2 mid-distal OM1 ISR (80-90%) & small inter-stent segment residual dissection:  Guide #2: XB 4.0; Cougar - LCx; BMW- OM --> 2.0 mm x 15 mm balloon; [place] Quantum Apex 2.5 mm x 12 mm (used to dilate distal stent ISR & possible dissection site; then the OM ostium) --> Stent Integrity Resolute 2.5 mm x 12 mm -> post dilated with balloon to 2.75  mm;  Lesion #1: mid LCx just beyond OM1 ~80% ISR focal --> [place] Quantum Apex 3.25 mm x 10 mm  TOURNIQUET: TR Band - 16 ml Air; 1137 hrs  DICTATION: .Note written in EPIC  PLAN OF CARE: Admit for overnight observation Restart Warfarin tonight Continue home medications  PATIENT DISPOSITION:  PACU - hemodynamically stable.   Delay start of Pharmacological VTE agent (>24hrs) due to surgical blood loss or risk of bleeding: not applicable; restarting Warfarin tonight

## 2012-03-15 NOTE — Progress Notes (Signed)
ANTICOAGULATION CONSULT NOTE - Initial Consult  Pharmacy Consult for Warfarin Indication: atrial fibrillation  No Known Allergies  Patient Measurements: Height: 6\' 3"  (190.5 cm) Weight: 250 lb (113.399 kg) IBW/kg (Calculated) : 84.5   Vital Signs: Temp: 98.4 F (36.9 C) (04/23 1555) Temp src: Oral (04/23 1555) BP: 112/58 mmHg (04/23 1555) Pulse Rate: 51  (04/23 1555)  Labs:  Basename 03/15/12 0750  HGB --  HCT --  PLT --  APTT --  LABPROT 15.1  INR 1.17  HEPARINUNFRC --  CREATININE --  CKTOTAL --  CKMB --  TROPONINI --   Estimated Creatinine Clearance: 68.6 ml/min (by C-G formula based on Cr of 1.4).  Medical History: Past Medical History  Diagnosis Date  . History of colon polyps   . Coronary artery disease     s/p PCI to LAD, to RCA, then extensive PCI to LCx-OM1  (enitre prox-AVG Circ for dissection in 07/2011)  . Hyperlipidemia   . Hypertension   . BPH (benign prostatic hypertrophy)   . Paroxysmal atrial fibrillation   . Cancer     SKIN CA    Medications:  Prescriptions prior to admission  Medication Sig Dispense Refill  . amiodarone (PACERONE) 200 MG tablet Take 200 mg by mouth daily.        [name] aspirin 81 MG tablet Take 81 mg by mouth daily.       [name] Co-Enzyme Q-10 10 MG CAPS Take 1 capsule by mouth daily.        [name] diltiazem (DILACOR XR) 180 MG 24 hr capsule Take 180 mg by mouth daily.        [name] doxazosin (CARDURA) 8 MG tablet Take 4 mg by mouth every evening.      . ezetimibe-simvastatin (VYTORIN) 10-20 MG per tablet Take 1 tablet by mouth at bedtime.      . finasteride (PROSCAR) 5 MG tablet Take 5 mg by mouth daily.      [name] LORazepam (ATIVAN) 0.5 MG tablet Take 0.5 mg by mouth at bedtime.      . metoprolol succinate (TOPROL-XL) 50 MG 24 hr tablet Take 50 mg by mouth 2 (two) times daily.      . mometasone (NASONEX) 50 MCG/ACT nasal spray Place 2 sprays into the nose daily as needed. For allergy flare up      . Multiple Vitamin (MULTIVITAMIN) capsule  Take 1 capsule by mouth daily.        . niacin (NIASPAN) 1000 MG CR tablet Take 2,000 mg by mouth at bedtime.      . nitroGLYCERIN (NITROSTAT) 0.4 MG SL tablet Place 0.4 mg under the tongue every 5 (five) minutes as needed. For chest pain      . Omega-3 Fatty Acids (FISH OIL) 1000 MG CAPS Take 1,000 capsules by mouth 2 (two) times daily.       . prasugrel (EFFIENT) 10 MG TABS Take 10 mg by mouth daily.       [name] warfarin (COUMADIN) 5 MG tablet Take 2.5-5 mg by mouth daily. Monday and Friday = 2.5 mg    All other days=5mg         Assessment: 66 year old man on chronic warfarin for a.fib.   INR 1.17 today.  Doses have been held in anticipation of cath.  Warfarin therapy to be resumed today.   Goal of Therapy:  INR 2-3   Plan:  Resume home dose.  Patient to get 5mg  today. Daily protimes.  Mickeal Skinner 03/15/2012,5:03 PM

## 2012-03-15 NOTE — H&P (Signed)
History and Physical Interval Note:  NAME:  MINOR IDEN   MRN: [id number] DOB:  1946/05/01   ADMIT DATE: 03/15/2012   03/15/2012 7:47 AM  Charles Robinson is a 66 y.o. 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  This was treated with full stenting of the OM to the LCirc with multiple stents placed in the proximal to distal LCirc well into the Posterolateral segment.  He recovered withour problems other than a recurrence of Afib requiring cardioversion.  He has been in his USOH with no angina or CHF symptoms, however a surveillance Nuclear Stress Test indicated a large area of "ischemia" in the Left Circumflex distribution.  He is therefore referred for re-look cardiac catheterization with possible PCI.   Past Medical History  Diagnosis Date  . History of colon polyps   . Coronary artery disease     s/p PCI to LAD, to RCA, then extensive PCI to LCx-OM1  (enitre prox-AVG Circ for dissection in 07/2011)  . Hyperlipidemia   . Hypertension   . BPH (benign prostatic hypertrophy)   . Paroxysmal atrial fibrillation    Past Surgical History  Procedure Date  . Coronary stent placement 2004    x 2   ALLERGIES: No Known Allergies  HOME MEDICATIONS: Prescriptions prior to admission  Medication Sig Dispense Refill  . amiodarone (PACERONE) 200 MG tablet Take 200 mg by mouth daily.        [name] aspirin 81 MG tablet Take 81 mg by mouth daily.       [name] Co-Enzyme Q-10 10 MG CAPS Take 1 capsule by mouth daily.        [name] diltiazem (DILACOR XR) 180 MG 24 hr capsule Take 180 mg by mouth daily.        [name] doxazosin (CARDURA) 8 MG tablet Take 4 mg by mouth every evening.      . ezetimibe-simvastatin (VYTORIN) 10-20 MG per tablet Take 1 tablet by mouth at bedtime.      . finasteride (PROSCAR) 5 MG  tablet Take 5 mg by mouth daily.      [name] LORazepam (ATIVAN) 0.5 MG tablet Take 0.5 mg by mouth at bedtime.      . metoprolol succinate (TOPROL-XL) 50 MG 24 hr tablet Take 50 mg by mouth 2 (two) times daily.      . mometasone (NASONEX) 50 MCG/ACT nasal spray Place 2 sprays into the nose daily as needed. For allergy flare up      . Multiple Vitamin (MULTIVITAMIN) capsule Take 1 capsule by mouth daily.        . niacin (NIASPAN) 1000 MG CR tablet Take 2,000 mg by mouth at bedtime.      . nitroGLYCERIN (NITROSTAT) 0.4 MG SL tablet Place 0.4 mg under the tongue every 5 (five) minutes as needed. For chest pain      . Omega-3 Fatty Acids (FISH OIL) 1000 MG CAPS Take 1,000 capsules by mouth 2 (two) times daily.       . prasugrel (EFFIENT) 10 MG TABS Take 10 mg by mouth daily.       [name] warfarin (COUMADIN) 5 MG tablet Take 2.5-5 mg by mouth daily. Monday and Friday = 2.5 mg    All other days=5mg         PHYSICAL EXAM:Blood pressure  127/74, pulse 50, temperature 97.9 F (36.6 C), resp. rate 18, height 6\' 3"  (1.905 m), weight 113.399 kg (250 lb), SpO2 97.00%. General appearance: alert, cooperative, appears stated age and no distress Neck: no adenopathy, no carotid bruit, no JVD, supple, symmetrical, trachea midline and thyroid not enlarged, symmetric, no tenderness/mass/nodules Lungs: clear to auscultation bilaterally and normal percussion bilaterally Heart: regular rate and rhythm, S1, S2 normal, no murmur, click, rub or gallop Abdomen: soft, non-tender; bowel sounds normal; no masses,  no organomegaly Neurologic: Grossly normal  IMPRESSION & PLAN The patients' history has been reviewed, patient examined, no change in status from most recent note, stable for surgery. I have reviewed the patients' chart and labs. Questions were answered to the patient's satisfaction.    Charles Robinson has presented today for surgery, with the diagnosis of High risk Nuclear Stress Test.  The various methods of treatment  have been discussed with the patient and family.   Risks / Complications include, but not limited to: Death, MI, CVA/TIA, VF/VT (with defibrillation), Bradycardia (need for temporary pacer placement), contrast induced nephropathy, bleeding / bruising / hematoma / pseudoaneurysm, vascular or coronary injury (with possible emergent CT or Vascular Surgery), adverse medication reactions, infection.    The patient voice understanding and agree to proceed.   I have signed the consent form and placed it on the chart for patient signature and RN witness.    After consideration of risks, benefits and other options for treatment, the patient has consented to Procedure(s):  LEFT HEART CATHETERIZATION AND CORONARY ANGIOGRAPHY +/- AD HOC PERCUTANEOUS CORONARY INTERVENTION  as a surgical intervention.   We will proceed with the planned procedure.   Jenilee Franey W [facility] [address] [place], [place]  [address]  [phone]  03/15/2012 7:47 AM

## 2012-03-16 ENCOUNTER — Encounter (HOSPITAL_COMMUNITY): Payer: Medicare Other

## 2012-03-16 LAB — BASIC METABOLIC PANEL
BUN: 18 mg/dL (ref 6–23)
CO2: 27 mEq/L (ref 19–32)
Calcium: 8.6 mg/dL (ref 8.4–10.5)
Chloride: 106 mEq/L (ref 96–112)
Creatinine, Ser: 1.47 mg/dL — ABNORMAL HIGH (ref 0.50–1.35)
GFR calc Af Amer: 55 mL/min — ABNORMAL LOW (ref >90)
GFR calc non Af Amer: 47 mL/min — ABNORMAL LOW (ref >90)
Glucose, Bld: 97 mg/dL (ref 70–99)
Potassium: 3.8 mEq/L (ref 3.5–5.1)
Sodium: 140 mEq/L (ref 135–145)

## 2012-03-16 LAB — CBC
HCT: 39.9 % (ref 39.0–52.0)
Hemoglobin: 13.3 g/dL (ref 13.0–17.0)
MCH: 31.1 pg (ref 26.0–34.0)
MCHC: 33.3 g/dL (ref 30.0–36.0)
MCV: 93.2 fL (ref 78.0–100.0)
Platelets: 113 10*3/uL — ABNORMAL LOW (ref 150–400)
RBC: 4.28 MIL/uL (ref 4.22–5.81)
RDW: 14 % (ref 11.5–15.5)
WBC: 4.8 10*3/uL (ref 4.0–10.5)

## 2012-03-16 LAB — PROTIME-INR
INR: 1.22 (ref 0.00–1.49)
Prothrombin Time: 15.7 seconds — ABNORMAL HIGH (ref 11.6–15.2)

## 2012-03-16 MED ORDER — WARFARIN SODIUM 5 MG PO TABS
5.0000 mg | ORAL_TABLET | Freq: Once | ORAL | Status: DC
  Filled 2012-03-16: qty 1

## 2012-03-16 MED ORDER — METOPROLOL SUCCINATE ER 25 MG PO TB24: 25.0000 mg | ORAL_TABLET | Freq: Two times a day (BID) | ORAL | Status: DC

## 2012-03-16 MED FILL — Dextrose Inj 5%: INTRAVENOUS | Qty: 50 | Status: AC

## 2012-03-16 NOTE — Progress Notes (Signed)
   CARE MANAGEMENT NOTE 03/16/2012  Patient:  Charles Robinson, Charles Robinson   Account Number:  [id number]  Date Initiated:  03/16/2012  Documentation initiated by:  GRAVES-BIGELOW,Jamey Harman  Subjective/Objective Assessment:   Pt admitted for  [facility] Plan for d/c today with effient- Pt was on effient prior to admission.     Action/Plan:   No needs for CM at this time.   Anticipated DC Date:  03/16/2012   Anticipated DC Plan:  HOME/SELF CARE      DC Planning Services  CM consult      Choice offered to / List presented to:             Status of service:  Completed, signed off Medicare Important Message given?   (If response is "NO", the following Medicare IM given date fields will be blank) Date Medicare IM given:   Date Additional Medicare IM given:    Discharge Disposition:  HOME/SELF CARE  Per UR Regulation:    If discussed at Long Length of Stay Meetings, dates discussed:    Comments:

## 2012-03-16 NOTE — Progress Notes (Signed)
[facility]  Subjective: No CP/SOB  Objective: Vital signs in last 24 hours: Temp:  [98.1 F (36.7 C)-99.1 F (37.3 C)] 98.3 F (36.8 C) (04/24 0747) Pulse Rate:  [46-59] 59  (04/24 0747) Resp:  [11-19] 18  (04/24 0747) BP: (104-137)/(48-75) 137/75 mmHg (04/24 0747) SpO2:  [93 %-99 %] 95 % (04/24 0747) Weight:  [117.1 kg (258 lb 2.5 oz)] 117.1 kg (258 lb 2.5 oz) (04/24 2130) Last BM Date: 03/14/12  Intake/Output from previous day: 04/23 0701 - 04/24 0700 In: 1326.4 [P.O.:680; I.V.:646.4] Out: 400 [Urine:400] Intake/Output this shift:    Medications Current Facility-Administered Medications  Medication Dose Route Frequency Provider Last Rate Last Dose  . 0.9 %  sodium chloride infusion  1 mL/kg/hr Intravenous Continuous Marykay Lex, MD   1 mL/kg/hr at 03/15/12 1611  . acetaminophen (TYLENOL) tablet 650 mg  650 mg Oral Q4H PRN Marykay Lex, MD      . ALPRAZolam Prudy Feeler) tablet 0.25 mg  0.25 mg Oral TID PRN Abelino Derrick, PA      . amiodarone (PACERONE) tablet 200 mg  200 mg Oral Daily Marykay Lex, MD      . aspirin EC tablet 81 mg  81 mg Oral Daily Marykay Lex, MD      . bivalirudin (ANGIOMAX) 250 MG injection           . diltiazem (DILACOR XR) 24 hr capsule 180 mg  180 mg Oral Daily Marykay Lex, MD      . doxazosin (CARDURA) tablet 4 mg  4 mg Oral QPM Marykay Lex, MD   4 mg at 03/15/12 1808  . ezetimibe-simvastatin (VYTORIN) 10-20 MG per tablet 1 tablet  1 tablet Oral QHS Marykay Lex, MD   1 tablet at 03/15/12 2152  . finasteride (PROSCAR) tablet 5 mg  5 mg Oral Daily Marykay Lex, MD      . fluticasone [facility]) 50 MCG/ACT nasal spray 1 spray  1 spray Each Nare Daily Marykay Lex, MD      . heparin 1000 UNIT/ML injection           . heparin 2-0.9 UNIT/ML-% infusion           . LORazepam (ATIVAN) tablet 0.5 mg  0.5 mg Oral QHS Marykay Lex, MD      . metoprolol succinate (TOPROL-XL) 24 hr tablet 50 mg  50 mg  Oral BID Marykay Lex, MD      . midazolam (VERSED) 2 MG/2ML injection           . morphine 2 MG/ML injection 2 mg  2 mg Intravenous Q1H PRN Marykay Lex, MD      . ondansetron [facility]) injection 4 mg  4 mg Intravenous Q6H PRN Marykay Lex, MD      . prasugrel (EFFIENT) tablet 10 mg  10 mg Oral Daily Marykay Lex, MD      . traMADol Janean Sark) tablet 50 mg  50 mg Oral Q6H PRN Abelino Derrick, [place]      . warfarin (COUMADIN) tablet 5 mg  5 mg Oral ONCE-1800 Marykay Lex, MD   5 mg at 03/15/12 1808  . Warfarin - Pharmacist Dosing Inpatient   Does not apply q1800 Marykay Lex, MD      . zolpidem [facility]) tablet 10 mg  10 mg Oral QHS PRN Abelino Derrick, [place]      . DISCONTD:  0.9 %  sodium chloride infusion   Intravenous Continuous Marykay Lex, MD 75 mL/hr at 03/15/12 [phone]    . DISCONTD: 0.9 %  sodium chloride infusion  250 mL Intravenous Continuous Marykay Lex, MD      . DISCONTD: bivalirudin (ANGIOMAX) 5 mg/mL in sodium chloride 0.9 % 50 mL infusion  0.25 mg/kg/hr Intravenous To Cath Marykay Lex, MD      . DISCONTD: metoprolol succinate (TOPROL-XL) 24 hr tablet 50 mg  50 mg Oral BID Marykay Lex, MD      . DISCONTD: sodium chloride 0.9 % injection 3 mL  3 mL Intravenous PRN Marykay Lex, MD      . DISCONTD: sodium chloride 0.9 % injection 3 mL  3 mL Intravenous Q12H Marykay Lex, MD      . DISCONTD: sodium chloride 0.9 % injection 3 mL  3 mL Intravenous PRN Marykay Lex, MD      . DISCONTD: warfarin (COUMADIN) tablet 2.5-5 mg  2.5-5 mg Oral ONCE-1800 Marykay Lex, MD        PE: General appearance: alert, cooperative and no distress Lungs: clear to auscultation bilaterally Heart: regular rate and rhythm Extremities: No LEE Pulses: Radials and PTs 2+ and symmetric.   Right wrist:  No hematoma, ecchymosis, erythema.   Lab Results:   [facility] 03/16/12 0355  WBC 4.8  HGB 13.3  HCT 39.9  PLT 113*   BMET  Basename 03/16/12 0355  NA 140  K 3.8  CL 106   CO2 27  GLUCOSE 97  BUN 18  CREATININE 1.47*  CALCIUM 8.6   PT/INR  Basename 03/16/12 0355 03/15/12 0750  LABPROT 15.7* 15.1  INR 1.22 1.17   Cholesterol No results found for this basename: CHOL in the last 72 hours Cardiac Enzymes No components found with this basename: TROPONIN:3, CKMB:3  Studies/Results: PATIENT: Charles Robinson 66 y.o. male with extensive stent work to LAD, RCA & LCx referred for catheterization due to abnormal Myoview ST with LCx distribution ischemia.  PRE-OPERATIVE DIAGNOSIS: abn nuclear ST  POST-OPERATIVE DIAGNOSIS:  Known mid RCA occlusion, Patent LAD stents, Patent LCx stents with ~80% focal ISR just distal to OM, OM proximal stent patent, but there appears to be a non-covered segment between the proximal and mid stent from 9/2-12 followed by ~80-90% ISR in the mid & distal stent.  Well preserved EF ~ 55 %, normal EDP with no WMA.  Successful 2 site PTCA with PTCA and stenting of OM2 (stent at uncovered overlap site with ? Dissection) & PTCA of distal stent ISR followed by PTCA of mid LCx ISR. PROCEDURE: Procedure(s) (LRB):  LEFT HEART CATHETERIZATION WITH CORONARY ANGIOGRAM (N/A)  PTCA and stenting of OM2 with Integrity Resolute DES 2.5 mm x 12 mm (post-dilated to 2.75 mm)  Cutting Balloon PTCA of mid LCirc with 3.25 mm x 10 mm non-compliant balloon SURGEON: Surgeon(s) and Role:  * Marykay Lex, MD - Primary  PHYSICIAN ASSISTANT:  ANESTHESIA: local; IV Versed 3 mg, Fentanyl 75 mcg  EBL: ~20 ml  LOCAL MEDICATIONS USED: LIDOCAINE - 2 ml  Medications: Heparin 5500 Units IV; Angiomax bolus & gtt  Euipment: Diagnostic - 5Fr R Radial Artery sheath --> 5Fr TIG 4.0 both coronary angiography  PCI: exchange to 6 Fr sheath; Initial Guide - VodaL 29F; Cougar OM, Luge LCx - after Emerge 2.0 x 15 used; guide support lost with attempt to advance 2.5 x 12 mm [place] Quantum -->  Lesion #  2 mid-distal OM1 ISR (80-90%) & small inter-stent segment residual dissection:   Guide #2: XB 4.0; Cougar - LCx; BMW- OM --> 2.0 mm x 15 mm balloon; [place] Quantum Apex 2.5 mm x 12 mm (used to dilate distal stent ISR & possible dissection site; then the OM ostium) --> Stent Integrity Resolute 2.5 mm x 12 mm -> post dilated with balloon to 2.75 mm;  Lesion #1: mid LCx just beyond OM1 ~80% ISR focal --> [place] Quantum Apex 3.25 mm x 10 mm  TOURNIQUET: TR Band - 16 ml Air; 1137 hrs  DICTATION: .Note written in EPIC  PLAN OF CARE: Admit for overnight observation  Restart Warfarin tonight  Continue home medications  PATIENT DISPOSITION: PACU - hemodynamically stable.  Delay start of Pharmacological VTE agent (>24hrs) due to surgical blood loss or risk of bleeding: not applicable; restarting Warfarin tonight  Assessment/Plan  Principal Problem:  *Abnormal nuclear stress test Active Problems:  HYPERLIPIDEMIA  HYPERTENSION  Atrial fibrillation - Paroxysmal  Coronary artery disease  Plan:  S/P left heart cath- PTCA and stenting of OM2 with Integrity Resolute DES 2.5 mm x 12 mm (post-dilated to 2.75 mm) Cutting Balloon PTCA of mid LCirc with 3.25 mm x 10 mm non-compliant balloon.  BP 104/55 - 127/74.  Coumadin restarted.  SCr slightly elevated comp[ared to sept 2012(1.01).   HR 46 - 59  recommend titrating lopressor down to 25mg  BID.  DC home today.   LOS: 1 day    Charles Robinson,Charles Robinson 03/16/2012 9:15 AM   I have seen & examined the patient today after Mr. Charles Robinson.  Looks great post PCI.  No problems o/n except brady in the high 40s - 50s.  BB dose reduced.  R wrist looks good.    I agree with Charles's exam, findings &  Plan. D/c today -- ROV with Dr. Clarene Duke (? Scheduled for 4/30), INR check this Friday 4/26. Agree with 1/2 dose (25 mg bid) of Metoprolol for d/c.  Standard post-radial care instructions.  Marykay Lex, M.D., M.S. [facility] [address] [place], [place]  [address]  [phone] Pager # [phone]  03/16/2012 1:52  PM

## 2012-03-16 NOTE — Discharge Summary (Signed)
Physician Discharge Summary  Patient ID: PAL SHELL MRN: [id number] DOB/AGE: [birth year] 66 y.o.  Admit date: 03/15/2012 Discharge date: 03/16/2012  Admission Diagnoses:  CAD  Discharge Diagnoses:  Principal Problem:  *Abnormal nuclear stress test Active Problems:  HYPERLIPIDEMIA  HYPERTENSION  Atrial fibrillation - Paroxysmal  Coronary artery disease   Discharged Condition: stable  Hospital Course:   Charles Robinson is a 66 y.o. 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 This was treated with full stenting of the OM to the LCirc with multiple stents placed in the proximal to distal LCirc well into the Posterolateral segment.   He recovered without problems other than a recurrence of Afib requiring cardioversion. He has been in his USOH with no angina or CHF symptoms, however a surveillance Nuclear Stress Test indicated a large area of "ischemia" in the Left Circumflex distribution. He is therefore referred for re-look cardiac catheterization with possible PCI.  Left heart cath was completed with successful two site PTCA with PTCA and stenting of OM2 (stent at uncovered overlap site with ? Dissection) & PTCA of distal stent ISR followed by PTCA of mid LCx ISR(See full report below).  The patient had periods of bradycardia with his heart rate falling to 49BPM.  His Toprol XL has been reduced to 25mg  twice daily from 50mg  twice daily.  He has been restarted on coumadin for PAF and will have his INR checked this coming Friday.  He has been seen by Dr. Herbie Baltimore who feels he is stable for DC home.  Consults: None  Significant Diagnostic Studies:  03/15/12, Left heart cath via right radial artery.  PATIENT: Charles Robinson 66 y.o. male with extensive stent work to LAD, RCA & LCx  referred for catheterization due to abnormal Myoview ST with LCx distribution ischemia.  PRE-OPERATIVE DIAGNOSIS: abn nuclear ST  POST-OPERATIVE DIAGNOSIS:  Known mid RCA occlusion, Patent LAD stents, Patent LCx stents with ~80% focal ISR just distal to OM, OM proximal stent patent, but there appears to be a non-covered segment between the proximal and mid stent from 9/2-12 followed by ~80-90% ISR in the mid & distal stent.  Well preserved EF ~ 55 %, normal EDP with no WMA.  Successful 2 site PTCA with PTCA and stenting of OM2 (stent at uncovered overlap site with ? Dissection) & PTCA of distal stent ISR followed by PTCA of mid LCx ISR. PROCEDURE: Procedure(s) (LRB):  LEFT HEART CATHETERIZATION WITH CORONARY ANGIOGRAM (N/A)  PTCA and stenting of OM2 with Integrity Resolute DES 2.5 mm x 12 mm (post-dilated to 2.75 mm)  Cutting Balloon PTCA of mid LCirc with 3.25 mm x 10 mm non-compliant balloon SURGEON: Surgeon(s) and Role:  * Marykay Lex, MD - Primary  PHYSICIAN ASSISTANT:  Charles: local; IV Versed 3 Robinson, Charles Robinson  EBL: ~20 ml  LOCAL MEDICATIONS USED: LIDOCAINE - 2 ml  Medications: Heparin 5500 Units IV; Angiomax bolus & gtt  Euipment: Diagnostic - 5Fr R Radial Artery sheath --> 5Fr TIG 4.0 both coronary angiography  PCI: exchange to 6 Fr sheath; Initial Guide - VodaL 16F; Cougar OM, Luge LCx - after Emerge 2.0 x 15 used; guide support lost with attempt to advance 2.5 x 12 mm [place] Quantum -->  Lesion #2 mid-distal OM1 ISR (80-90%) & small inter-stent segment  residual dissection:  Guide #2: XB 4.0; Cougar - LCx; BMW- OM --> 2.0 mm x 15 mm balloon; [place] Quantum Apex 2.5 mm x 12 mm (used to dilate distal stent ISR & possible dissection site; then the OM ostium) --> Stent Integrity Resolute 2.5 mm x 12 mm -> post dilated with balloon to 2.75 mm;  Lesion #1: mid LCx just beyond OM1 ~80% ISR focal --> [place] Quantum Apex 3.25 mm x 10 mm  TOURNIQUET: TR Band - 16 ml Air; 1137 hrs  DICTATION:  .Note written in EPIC  PLAN OF CARE: Admit for overnight observation  Restart Warfarin tonight  Continue home medications  PATIENT DISPOSITION: PACU - hemodynamically stable.  Delay start of Pharmacological VTE agent (>24hrs) due to surgical blood loss or risk of bleeding: not applicable; restarting Warfarin tonight  Treatments:   Successful 2 site PTCA with PTCA and stenting of OM2 (stent at uncovered overlap site with ?    Dissection) & PTCA of distal stent ISR followed by PTCA of mid LCx ISR.  Decreased Toprol XL   to 25 Robinson BID.  Discharge Exam: Blood pressure 120/63, pulse 54, temperature 98.3 F (36.8 C), temperature source Oral, resp. rate 18, height 6\' 3"  (1.905 m), weight 117.1 kg (258 lb 2.5 oz), SpO2 95.00%.  Disposition: 01-Home or Self Care  Discharge Orders    Future Orders Please Complete By Expires   Amb Referral to Cardiac Rehabilitation      Diet - low sodium heart healthy      Increase activity slowly      Discharge instructions      Comments:   No lifting with right arm for two days.     Medication List  As of 03/16/2012  2:05 PM   TAKE these medications         amiodarone 200 Robinson tablet   Commonly known as: PACERONE   Take 200 Robinson by mouth daily.      aspirin 81 Robinson tablet   Take 81 Robinson by mouth daily.      Co-Enzyme Q-10 10 Robinson Caps   Take 1 capsule by mouth daily.      diltiazem 180 Robinson 24 hr capsule   Commonly known as: DILACOR XR   Take 180 Robinson by mouth daily.      doxazosin 8 Robinson tablet   Commonly known as: CARDURA   Take 4 Robinson by mouth every evening.      ezetimibe-simvastatin 10-20 Robinson per tablet   Commonly known as: VYTORIN   Take 1 tablet by mouth at bedtime.      finasteride 5 Robinson tablet   Commonly known as: PROSCAR   Take 5 Robinson by mouth daily.      Fish Oil 1000 Robinson Caps   Take 1,000 capsules by mouth 2 (two) times daily.      LORazepam 0.5 Robinson tablet   Commonly known as: ATIVAN   Take 0.5 Robinson by mouth at bedtime.      metoprolol succinate  25 Robinson 24 hr tablet   Commonly known as: TOPROL-XL   Take 1 tablet (25 Robinson total) by mouth 2 (two) times daily.      mometasone 50 Robinson/ACT nasal spray   Commonly known as: NASONEX   Place 2 sprays into the nose daily as needed. For allergy flare up      multivitamin capsule   Take 1 capsule by mouth daily.      niacin 1000 Robinson CR tablet  Commonly known as: NIASPAN   Take 2,000 Robinson by mouth at bedtime.      nitroGLYCERIN 0.4 Robinson SL tablet   Commonly known as: NITROSTAT   Place 0.4 Robinson under the tongue every 5 (five) minutes as needed. For chest pain      prasugrel 10 Robinson Tabs   Commonly known as: EFFIENT   Take 10 Robinson by mouth daily.      warfarin 5 Robinson tablet   Commonly known as: COUMADIN   Take 2.5-5 Robinson by mouth daily. Monday and Friday = 2.5 Robinson    All other days=5mg              Signed: Dwana Melena 03/16/2012, 2:05 PM  I have seen & examined the patient today after Mr. Leron Croak. Looks great post PCI. No problems o/n except brady in the high 40s - 50s. BB dose reduced. R wrist looks good.  I agree with Bryan's exam, findings & Plan. D/c today -- ROV with Dr. Clarene Duke (? Scheduled for 4/30), INR check this Friday 4/26.  Agree with 1/2 dose (25 Robinson bid) of Metoprolol for d/c.  Standard post-radial care instructions. I agree with Bryan's summary.  Marykay Lex, M.D., M.S. [facility] [address] [place], [place]  [address]  [phone] Pager # [phone]  03/16/2012 2:21 PM

## 2012-03-16 NOTE — Progress Notes (Signed)
ANTICOAGULATION CONSULT NOTE - Initial Consult  Pharmacy Consult for Warfarin Indication: atrial fibrillation  No Known Allergies  Patient Measurements: Height: 6\' 3"  (190.5 cm) Weight: 258 lb 2.5 oz (117.1 kg) IBW/kg (Calculated) : 84.5   Vital Signs: Temp: 98.3 F (36.8 C) (04/24 0747) Temp src: Oral (04/24 0747) BP: 137/75 mmHg (04/24 0747) Pulse Rate: 59  (04/24 0747)  Labs:  Basename 03/16/12 0355 03/15/12 0750  HGB 13.3 --  HCT 39.9 --  PLT 113* --  APTT -- --  LABPROT 15.7* 15.1  INR 1.22 1.17  HEPARINUNFRC -- --  CREATININE 1.47* --  CKTOTAL -- --  CKMB -- --  TROPONINI -- --   Estimated Creatinine Clearance: 66.3 ml/min (by C-G formula based on Cr of 1.47).  Medical History: Past Medical History  Diagnosis Date  . History of colon polyps   . Coronary artery disease     s/p PCI to LAD, to RCA, then extensive PCI to LCx-OM1  (enitre prox-AVG Circ for dissection in 07/2011)  . Hyperlipidemia   . Hypertension   . BPH (benign prostatic hypertrophy)   . Paroxysmal atrial fibrillation   . Cancer     SKIN CA    Medications:  Prescriptions prior to admission  Medication Sig Dispense Refill  . amiodarone (PACERONE) 200 MG tablet Take 200 mg by mouth daily.        [name] aspirin 81 MG tablet Take 81 mg by mouth daily.       [name] Co-Enzyme Q-10 10 MG CAPS Take 1 capsule by mouth daily.        [name] diltiazem (DILACOR XR) 180 MG 24 hr capsule Take 180 mg by mouth daily.        [name] doxazosin (CARDURA) 8 MG tablet Take 4 mg by mouth every evening.      . ezetimibe-simvastatin (VYTORIN) 10-20 MG per tablet Take 1 tablet by mouth at bedtime.      . finasteride (PROSCAR) 5 MG tablet Take 5 mg by mouth daily.      [name] LORazepam (ATIVAN) 0.5 MG tablet Take 0.5 mg by mouth at bedtime.      . metoprolol succinate (TOPROL-XL) 50 MG 24 hr tablet Take 50 mg by mouth 2 (two) times daily.      . mometasone (NASONEX) 50 MCG/ACT nasal spray Place 2 sprays into the nose daily as needed. For  allergy flare up      . Multiple Vitamin (MULTIVITAMIN) capsule Take 1 capsule by mouth daily.        . niacin (NIASPAN) 1000 MG CR tablet Take 2,000 mg by mouth at bedtime.      . nitroGLYCERIN (NITROSTAT) 0.4 MG SL tablet Place 0.4 mg under the tongue every 5 (five) minutes as needed. For chest pain      . Omega-3 Fatty Acids (FISH OIL) 1000 MG CAPS Take 1,000 capsules by mouth 2 (two) times daily.       . prasugrel (EFFIENT) 10 MG TABS Take 10 mg by mouth daily.       [name] warfarin (COUMADIN) 5 MG tablet Take 2.5-5 mg by mouth daily. Monday and Friday = 2.5 mg    All other days=5mg         Assessment: 66 year old man on chronic warfarin for a.fib.   INR 1.17 today.  Doses had been held in anticipation of cath.  Warfarin therapy resumed 4/23 with 5 mg.  Home dose 5 mg daily except 2.5 mg on Mon, Fri.  Goal of Therapy:  INR 2-3   Plan:  Continue Coumadin at home dose.  Patient to get 5mg  today. Daily protimes.  ** MD - Lorain Childes -  patient is on simvastatin 20 mg (part of Vytorin) and diltiazem; continued from home. Maximum dose of simvastatin is 10 mg with concomitant simvastatin due to risk of rhabdomyolysis. Please consider change to alternative statin. Thanks!    Kairi Tufo C 03/16/2012,10:24 AM

## 2012-03-16 NOTE — Progress Notes (Signed)
CARDIAC REHAB PHASE I   PRE:  Rate/Rhythm: 59 SB  BP:  Supine: 137/75  Sitting:   Standing:    SaO2:   MODE:  Ambulation: 680 ft   POST:  Rate/Rhythem: 68 SR  BP:  Supine:   Sitting: 133/77  Standing:    SaO2:  5621-3086 Tolerated ambulation well without c/o of cp or SOB. VS stable. Reviewed discharge education with pt and wife. Pt agrees to Outpt. CRP in [place], will send referral.  Beatrix Fetters

## 2012-03-17 NOTE — CV Procedure (Addendum)
[facility]     CARDIAC CATHETERIZATION REPORT  NAME: Charles Robinson MRN: [id number] DOB: Dec 27, 1945  ADMIT DATE: 03/15/2012  Performing Cardiologist: Marykay Lex  Primary Physician: Evette Georges, MD, MD Primary Cardiologist:  Julieanne Manson, MD  Procedures Performed:  Left Heart Catheterization via 5 Fr Right Radial Artery access  Left Ventriculography, (RAO) 12 ml/sec for 25 ml total contrast  Native Coronary Angiography  IC NTG Injection  Complex Percutaneous Coronary Artery Intervention on OM1 In-Stent Restenosis with partial dissection in between Stents with Integrity Resolute DES 2.5 mm x 12 mm; final diameter  2.8 mm along with PTCA of in-stent stenosis in the distal stent.  High-pressure Cutting Balloon PTCA/Atherectomy of In-Stent Restenosis of the Left Circumflex at the OM1 bifurcation.  Indication(s): High risk nuclear stress test with anterolateral and inferolateral ischemia  Known extensive CAD with extensive stenting in the LAD as well as recent stenting in the circumflex for similar stress test results with the procedure compensated by extensive dissection and total mid AV groove circumflex and OM 2 stenting.   History: [name] is a 66 y.o. 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 This was treated with full stenting of the OM to the LCirc with multiple stents placed in the proximal to distal LCirc well into the Posterolateral segment.  He recovered withour problems other than a recurrence of Afib requiring cardioversion. He has been in his USOH with no angina or CHF symptoms, however a surveillance Nuclear Stress Test indicated a large area of "ischemia" in the Left Circumflex distribution. He is therefore referred  for re-look cardiac catheterization with possible PCI.  Consent: The procedure with Risks/Benefits/Alternatives and Indications was reviewed with the patient (and family).  All questions were answered.    Risks / Complications include, but not limited to: Death, MI, CVA/TIA, VF/VT (with defibrillation), Bradycardia (need for temporary pacer placement), contrast induced nephropathy, bleeding / bruising / hematoma / pseudoaneurysm, vascular or coronary injury (with possible emergent CT or Vascular Surgery), adverse medication reactions, infection.    The patient and family voice understanding and agree to proceed.   Consent for signed by MD and patient with RN witness -- placed on chart.  Procedure: The patient was brought to the 2nd Floor [facility] in the fasting state and prepped and draped in the usual sterile fashion for Right groin or radia) access.  A modified Allen's test with plethysmography was performed on the right wrist demonstrating adequate Ulnar Artery collateral flow.    Sterile technique was used including antiseptics, cap, gloves, gown, hand hygiene, mask and sheet. Skin prep: Chlorhexidine;  Time Out: Verified patient identification, verified procedure, site/side was marked, verified correct patient position, special equipment/implants available, medications/allergies/relevent history reviewed, required imaging and test results available.  Performed  The right wrist was anesthetized with 1% subcutaneous L E. 3 times a day as as idocaine.  The right radial artery was accessed using the Seldinger Technique with placement of a 5 Fr Glide Sheath. The sheath was aspirated and flushed.  Then a total of  10 ml of standard Radial Artery Cocktail (see medications) was infused.  Radial Cocktail: 5 mg Verapamil, 400 mcg NTG, 2 ml 2% Lidocaine  A  5 Fr TIG 4.0 Catheter was advanced of  over a Lexicographer J wire into the ascending Aorta.  The catheter was  used to engage the  Left then Right coronary Artery.  Multiple cineangiographic views of both the Left and Right coronary artery system(s) were performed.   This catheter was then advanced across the Aortic Valve over a wire.  LV hemodynamics were measured (and) Left Ventriculography was not performed as the catheter was directed such that an intramyocardial injection was likely.  LV hemodynamics were then re-sampled, and the catheter was pulled back across the Aortic Valve for measurement of "pull-back" gradient.  The catheter was removed completely out of the body over the [employer] J wire.  Hemodynamics:  Central Aortic Pressure: 144/72 mmHg; 100 mmHg   LV Pressure / LV End diastolic Pressure: 144/9 mmHg;  22  mmHg  Coronary Angiographic Data:   Dominance:  Left   Left Main:  a large caliber short vessel bifurcates into LAD and Circumflex; angiographically normal   Left Anterior Descending (LAD):  large caliber vessel gives rise to a proximal septal trunk that has 3 major branches. Following this there is extensive stent work with minimal in-stent stenosis is at least one diagonal branch comes out of this stent system. Distal to the stents there is tortuosity within the diagonal branch but minimal luminal irregularities throughout the remainder portion of this vessel and diagonal vessels.  Circumflex (LCx):  large caliber dominant vessel with patent proximal portion of the stent then just after the OM1 there is a focal 70-80% in-stent restenosis; following this lesion the stented section down to the next bifurcation is widely patent.   1st obtuse marginal:   Moderate caliber vessel with a proximal stent is widely patent, there then appears to be a short segment not covered by stent with a 70% stenosis in the appearance of a possible residual dissection but then leads to in-stent restenosis into the distal stent of 60-80%. There is a branch that was jailed with stents that has ostial  40% lesion with TIMI 3 flow, and the distal vessel bifurcates just after the stent with minimal luminal irregularities distally.   Atrio-ventricular Groove (AVG) Cx:  widely patent stents with a small calibered jailed second obtuse marginal with ostial 60% lesion with TIMI 3 flow. At the completion of the stents the vessel essentially trifurcates into a third obtuse marginal and then the AV groove branch with a posterior lateral and posterior descending artery there is diffusely diseased but minimal luminal irregularities.  Right Coronary Artery: Moderate caliber, presumed to be nondominant right coronary artery with mid 100% occlusion proximal to the stent. The distal vessels fill from left to right collaterals coming from both the circumflex and the LAD distribution.  PERCUTANEOUS CORONARY INTERVENTION PROCEDURE  After reviewing the initial cineangiography images, the culprit lesion(s) was identified, and the decision was made to proceed with percutaneous coronary intervention.  The 5Fr Sheath was exchanged over a wire for a 6Fr sheath.   A weight based bolus of IV Angiomax was administered and the drip was continued until completion of the procedure.   His standing dose of Oral Prasugrel 10 mg  was take in the morning prior to the procedure n  The Guide catheter(s) were advanced over a J-wire and used to engage the left Coronary Artery. An ACT of > 200 Sec was confirmed prior to advancing the Guidewire. After initial engagement with the first guide catheter and wiring of both lesions, additional predilatation in the OM was performed; however  while attempting to pass a noncompliant balloon into the OM through the stents of the circumflex, guide catheter position was lost and wires were pulled out of the vessel. Multiple tests were then made to re\re engagement with the existing guide catheter which was successful. Therefore the original guide catheter was exchanged for a XB 4.0 guide catheter. This  a complex PCI due to the obtuse marginal being jailed for the previous stents placed during this dissection then the previous PCI. Is also a 2 site intervention first of the OM as well as in the mid circumflex.   Lesion #2:   mid to distal OM 1; 60-80% stenoses with possible intervening segment dissection between the proximal and mid stent; this was thought to be an uncovered segment not readily noted during the first PCI in September at the time of dissection.  Pre-PCI Stenosis:  60-80  % Post-PCI Stenosis:  0  %     TIMI 2  flow       TIMI 3  flow  Guide Catheter:  original, 6 Fr Voda Left; XB 4.0   Guidewire:  for initial predilation -- Cougar Wire; for stenting and final post-dilation-- BMW wire -  Pre-Dilitation Balloon #1:  Emerge 2.0 x 15 mm    1st Inflation:   12 Atm for  40 Sec; the intervening segment   2nd Inflation:  6  Atm for  20  Sec; the distal stent ISR   3rd Inflation:  6  Atm for  15  Sec; at the OM ostium through the Circumflex stent   (After new guide placement)   4th Inflation:  12 Atm for  45  Sec; intervening segment  5th Inflation: 12  Atm for  45  Sec; distal stent ISR Scout angiography did reveal  Possible evidence of  residual dissection  in the intervening segment but no perforation. There is also still residual stenoses in the distal stent. Pre-Dilitation Balloon #1: Webb Quantum Apex 2.5 x 12 mm   1st Inflation:  14 Atm for 120 Sec; the intervening segment  2nd Inflation: 6  Atm for  45 Sec; the distal stent ISR -- final diameter 2.5 mm and a 2.25 mm stent  3rd Inflation: 8  Atm for  15  Sec; more proximal torsion of distal stent ISR -- final diameter 2.5 mm and a 2.25 mm stent  4th Inflation: 6 Atm for  15  Sec; at the OM ostium through the Circumflex stent Scout angiography did reveal  Possible evidence of significantly improved residual dissection  in the intervening segment but no perforation. There was minimal residual stenoses in the distal stent. STENT in the  intervening segment but to not covered:  Integrity Resolute DES 2.5 mm x 12 mm    1st Inflation: 12 Atm for 30  Sec   Post Dilation:  16  Atm for 60  Sec; final diameter 2.8 mm     Scout angiography revealed covering of dissectionwith no perforation and minimal distal stent stenosis.   Attention was then turned to the mid Circumflex lesion  Lesion #  1 :   the mid Circumflex ISR   Pre-PCI Stenosis:  70-80  % Post-PCI Stenosis:  0  %     TIMI 3 flow       TIMI 3 flow  Guide Catheter:  XB 4.0    Guidewire: Cougar   Post-Dilitation Balloon:  Flextome Cutting Balloon 3.25 mm x 10 mm   1st Inflation: 12  Atm for 60  Sec; final diameter 3.3 mm      Post-deployment cineangiography with and without guidewire in place was performed in multiple orthogonal views demonstrating  excellent  stent deployment and  resolution of residual dissection with minimal in-stent restenosis noted in both the circumflex and distal OM.   The sheath was removed in the Cath Lab with a TR band placed at  16  ml Air at 1137 hours (time).  Reverse Allen's test with plethysmography revealed non-occlusive hemostasis.  The patient was transported to the  PACU in hemodynamically stable, chest pain-free condition.   The patient  was stable before, during and following the procedure.   Patient did tolerate procedure well. There were not complications.  EBL: < 20 mL   Medications:  Premedication:  5 mg  Valium,  Sedation:  3  mg IV Versed, 75  IV mcg Fentanyl  Contrast:  225 mL  Omnipaque  Radial Cocktail: 2.5 mg Nicardipine, 400 mcg NTG, 2 ml 2% Lidocaine  IV Heparin: 5500 units    Angiomax bolus and drip   Impression:  Known mid RCA occlusion, Patent LAD stents, Patent LCx stents with ~80% focal ISR just distal to OM, OM proximal stent patent, but there appears to be a non-covered segment between the proximal and mid stent from 9/2-12 followed by ~80-90% ISR in the mid & distal stent.   Well preserved EF ~ 55 %,  normal EDP with no WMA.   Successful PCI and PTCA of small residual dissection as well as In-Stent Restenosis in the major Obtuse Marginal Branch that was similar to the original lesion at the time of the first intervention in September.    Successful cutting balloon atherectomy/PTCA of In-Stent Restenosis at the bifurcation of the mid Circumflex and OM1.   Plan:   Admit for overnight monitoring with standard post-radial PCI care.  Restart warfarin tonight for atrial fibrillation.  Continue home medications including amiodarone, metoprolol, statin, dual antiplatelet therapy with aspirin and Effient.  Anticipate discharge the morning if stable. He will follow up with Dr. Clarene Duke as scheduled. We will also arrange for an INR at the end of the week.   The case and results was discussed with the patient (and family). The case and results was not discussed with the patient's PCP. The case and results was discussed with the patient's Cardiologist.  Time Spend Directly with Patient:   2 hours    Sho Salguero W, M.D., M.S. [facility] [address] [place], [place]  [address]  [phone]  03/17/2012 9:55 AM

## 2012-03-18 ENCOUNTER — Encounter (HOSPITAL_COMMUNITY): Payer: Medicare Other

## 2012-03-21 ENCOUNTER — Encounter (HOSPITAL_COMMUNITY): Payer: Medicare Other

## 2012-03-22 MED FILL — Nicardipine HCl IV Soln 2.5 MG/ML: INTRAVENOUS | Qty: 1 | Status: AC

## 2012-03-23 ENCOUNTER — Encounter (HOSPITAL_COMMUNITY): Payer: Medicare Other

## 2012-03-25 ENCOUNTER — Encounter (HOSPITAL_COMMUNITY): Payer: Medicare Other

## 2012-03-28 ENCOUNTER — Encounter (HOSPITAL_COMMUNITY): Payer: Medicare Other

## 2012-03-30 ENCOUNTER — Encounter (HOSPITAL_COMMUNITY): Payer: Medicare Other

## 2012-04-01 ENCOUNTER — Encounter (HOSPITAL_COMMUNITY): Payer: Medicare Other

## 2012-04-04 ENCOUNTER — Encounter (HOSPITAL_COMMUNITY): Payer: Medicare Other

## 2012-04-06 ENCOUNTER — Encounter (HOSPITAL_COMMUNITY): Payer: Medicare Other

## 2012-04-08 ENCOUNTER — Encounter (HOSPITAL_COMMUNITY): Payer: Medicare Other

## 2012-04-11 ENCOUNTER — Encounter (HOSPITAL_COMMUNITY): Payer: Medicare Other

## 2012-04-13 ENCOUNTER — Encounter (HOSPITAL_COMMUNITY): Payer: Medicare Other

## 2012-04-15 ENCOUNTER — Encounter (HOSPITAL_COMMUNITY): Payer: Medicare Other

## 2012-04-18 ENCOUNTER — Encounter (HOSPITAL_COMMUNITY): Payer: Medicare Other

## 2012-04-20 ENCOUNTER — Encounter (HOSPITAL_COMMUNITY): Payer: Medicare Other

## 2012-04-21 ENCOUNTER — Other Ambulatory Visit: Payer: Self-pay | Admitting: Dermatology

## 2012-04-22 ENCOUNTER — Encounter (HOSPITAL_COMMUNITY): Payer: Medicare Other

## 2012-04-25 ENCOUNTER — Encounter (HOSPITAL_COMMUNITY): Payer: Medicare Other

## 2012-04-27 ENCOUNTER — Encounter (HOSPITAL_COMMUNITY): Payer: Medicare Other

## 2012-04-29 ENCOUNTER — Encounter (HOSPITAL_COMMUNITY): Payer: Medicare Other

## 2012-05-02 ENCOUNTER — Encounter (HOSPITAL_COMMUNITY): Payer: Medicare Other

## 2012-05-04 ENCOUNTER — Encounter (HOSPITAL_COMMUNITY): Payer: Medicare Other

## 2012-05-06 ENCOUNTER — Encounter (HOSPITAL_COMMUNITY): Payer: Medicare Other

## 2012-05-09 ENCOUNTER — Encounter (HOSPITAL_COMMUNITY): Payer: Medicare Other

## 2012-05-11 ENCOUNTER — Encounter (HOSPITAL_COMMUNITY): Payer: Medicare Other

## 2012-05-13 ENCOUNTER — Encounter (HOSPITAL_COMMUNITY): Payer: Medicare Other

## 2012-05-16 ENCOUNTER — Encounter (HOSPITAL_COMMUNITY): Payer: Medicare Other

## 2012-05-18 ENCOUNTER — Encounter (HOSPITAL_COMMUNITY): Payer: Medicare Other

## 2012-05-20 ENCOUNTER — Encounter (HOSPITAL_COMMUNITY): Payer: Medicare Other

## 2012-05-23 ENCOUNTER — Encounter (HOSPITAL_COMMUNITY): Payer: Medicare Other

## 2012-05-25 ENCOUNTER — Encounter (HOSPITAL_COMMUNITY): Payer: Medicare Other

## 2012-05-27 ENCOUNTER — Encounter (HOSPITAL_COMMUNITY): Payer: Medicare Other

## 2012-05-30 ENCOUNTER — Encounter (HOSPITAL_COMMUNITY): Payer: Medicare Other

## 2012-06-01 ENCOUNTER — Encounter (HOSPITAL_COMMUNITY): Payer: Medicare Other

## 2012-06-03 ENCOUNTER — Encounter (HOSPITAL_COMMUNITY): Payer: Medicare Other

## 2012-06-06 ENCOUNTER — Encounter (HOSPITAL_COMMUNITY): Payer: Medicare Other

## 2012-06-08 ENCOUNTER — Encounter (HOSPITAL_COMMUNITY): Payer: Medicare Other

## 2012-06-10 ENCOUNTER — Encounter (HOSPITAL_COMMUNITY): Payer: Medicare Other

## 2012-06-13 ENCOUNTER — Encounter (HOSPITAL_COMMUNITY): Payer: Medicare Other

## 2012-06-15 ENCOUNTER — Encounter (HOSPITAL_COMMUNITY): Payer: Medicare Other

## 2012-06-17 ENCOUNTER — Encounter (HOSPITAL_COMMUNITY): Payer: Medicare Other

## 2012-06-20 ENCOUNTER — Encounter (HOSPITAL_COMMUNITY): Payer: Medicare Other

## 2012-06-22 ENCOUNTER — Encounter (HOSPITAL_COMMUNITY): Payer: Medicare Other

## 2012-06-24 ENCOUNTER — Encounter (HOSPITAL_COMMUNITY): Payer: Medicare Other

## 2012-06-27 ENCOUNTER — Encounter (HOSPITAL_COMMUNITY): Payer: Medicare Other

## 2012-06-29 ENCOUNTER — Encounter (HOSPITAL_COMMUNITY): Payer: Medicare Other

## 2012-07-01 ENCOUNTER — Encounter (HOSPITAL_COMMUNITY): Payer: Medicare Other

## 2012-07-04 ENCOUNTER — Encounter (HOSPITAL_COMMUNITY): Payer: Medicare Other

## 2012-07-06 ENCOUNTER — Encounter (HOSPITAL_COMMUNITY): Payer: Medicare Other

## 2012-07-08 ENCOUNTER — Encounter (HOSPITAL_COMMUNITY): Payer: Medicare Other

## 2012-07-11 ENCOUNTER — Encounter (HOSPITAL_COMMUNITY): Payer: Medicare Other

## 2012-07-13 ENCOUNTER — Encounter (HOSPITAL_COMMUNITY): Payer: Medicare Other

## 2012-07-15 ENCOUNTER — Encounter (HOSPITAL_COMMUNITY): Payer: Medicare Other

## 2012-07-18 ENCOUNTER — Encounter (HOSPITAL_COMMUNITY): Payer: Medicare Other

## 2012-07-20 ENCOUNTER — Encounter (HOSPITAL_COMMUNITY): Payer: Medicare Other

## 2012-07-22 ENCOUNTER — Encounter (HOSPITAL_COMMUNITY): Payer: Medicare Other

## 2012-07-25 ENCOUNTER — Encounter (HOSPITAL_COMMUNITY): Payer: Medicare Other

## 2012-07-27 ENCOUNTER — Encounter (HOSPITAL_COMMUNITY): Payer: Medicare Other

## 2012-07-28 ENCOUNTER — Ambulatory Visit (HOSPITAL_COMMUNITY)
Admission: RE | Admit: 2012-07-28 | Discharge: 2012-07-28 | Disposition: A | Discharge status: Home / Self Care | Payer: Medicare Other | Source: Ambulatory Visit | Attending: Cardiology | Admitting: Cardiology

## 2012-07-28 DIAGNOSIS — I4891 Unspecified atrial fibrillation: Secondary | ICD-10-CM | POA: Insufficient documentation

## 2012-07-28 DIAGNOSIS — Z79899 Other long term (current) drug therapy: Secondary | ICD-10-CM | POA: Insufficient documentation

## 2012-07-29 ENCOUNTER — Encounter (HOSPITAL_COMMUNITY): Payer: Medicare Other

## 2012-08-01 ENCOUNTER — Encounter (HOSPITAL_COMMUNITY): Payer: Medicare Other

## 2012-08-03 ENCOUNTER — Encounter (HOSPITAL_COMMUNITY): Payer: Medicare Other

## 2012-08-05 ENCOUNTER — Encounter (HOSPITAL_COMMUNITY): Payer: Medicare Other

## 2012-08-08 ENCOUNTER — Encounter (HOSPITAL_COMMUNITY): Payer: Medicare Other

## 2012-08-10 ENCOUNTER — Encounter (HOSPITAL_COMMUNITY): Payer: Medicare Other

## 2012-08-12 ENCOUNTER — Encounter (HOSPITAL_COMMUNITY): Payer: Medicare Other

## 2012-08-15 ENCOUNTER — Encounter (HOSPITAL_COMMUNITY): Payer: Medicare Other

## 2012-08-17 ENCOUNTER — Encounter (HOSPITAL_COMMUNITY): Payer: Medicare Other

## 2012-08-19 ENCOUNTER — Encounter (HOSPITAL_COMMUNITY): Payer: Medicare Other

## 2012-08-22 ENCOUNTER — Encounter (HOSPITAL_COMMUNITY): Payer: Medicare Other

## 2012-08-24 ENCOUNTER — Encounter (HOSPITAL_COMMUNITY): Payer: Medicare Other

## 2012-08-26 ENCOUNTER — Encounter (HOSPITAL_COMMUNITY): Payer: Medicare Other

## 2012-08-29 ENCOUNTER — Encounter (HOSPITAL_COMMUNITY): Payer: Medicare Other

## 2012-08-31 ENCOUNTER — Encounter (HOSPITAL_COMMUNITY): Payer: Medicare Other

## 2012-09-02 ENCOUNTER — Encounter (HOSPITAL_COMMUNITY): Payer: Medicare Other

## 2012-09-05 ENCOUNTER — Encounter (HOSPITAL_COMMUNITY): Payer: Medicare Other

## 2012-09-07 ENCOUNTER — Encounter (HOSPITAL_COMMUNITY): Payer: Medicare Other

## 2012-09-09 ENCOUNTER — Encounter (HOSPITAL_COMMUNITY): Payer: Medicare Other

## 2012-09-12 ENCOUNTER — Encounter (HOSPITAL_COMMUNITY): Payer: Medicare Other

## 2012-09-14 ENCOUNTER — Encounter (HOSPITAL_COMMUNITY): Payer: Medicare Other

## 2012-09-16 ENCOUNTER — Encounter (HOSPITAL_COMMUNITY): Payer: Self-pay | Admitting: *Deleted

## 2012-09-16 ENCOUNTER — Encounter (HOSPITAL_COMMUNITY): Payer: Medicare Other

## 2012-09-16 ENCOUNTER — Encounter (HOSPITAL_COMMUNITY): Payer: Self-pay | Admitting: Pharmacy Technician

## 2012-09-16 NOTE — H&P (Signed)
Charles Robinson is an 66 y.o. male.    Chief Complaint:  Left quad tendon rupture  HPI: Pt is a 66 y.o. male complaining of right knee pain for 10 days. The pain started while he was out he slipped and his left leg ended up underneath his body and he felt a pop. Since that time he has had swelling pain and the inability ro extend his left leg. Pain had continually increased since the beginning. X-rays in the clinic show no acute bony abnormalities.  A MRI was obtained which reveled a left quad tendon rupture. He has been in a knee immobilizer/T-ROM brace since being seen in the clinic. Upon return to the clinic after the MRI Dr. Charlann Boxer discussed various options with the patient. Risks, benefits and expectations were discussed with the patient. Patient understand the risks, benefits and expectations and wishes to proceed with surgery.   PCP:  Evette Georges, MD  D/C Plans:  Home with HHPT  Post-op Meds:  No Rx given   Tranexamic Acid:   Not to be given - Hx of CAD  Decadron:   To be given  PMH: Past Medical History  Diagnosis Date  . History of colon polyps   . Coronary artery disease     s/p PCI to LAD, to RCA, then extensive PCI to LCx-OM1  (enitre prox-AVG Circ for dissection in 07/2011)  . Hyperlipidemia   . Hypertension   . BPH (benign prostatic hypertrophy)   . Paroxysmal atrial fibrillation   . Cancer     SKIN CA    PSH: Past Surgical History  Procedure Date  . Coronary stent placement 2004    x 2  . Ptca 03/15/2012  . Back surgery   . Cardiac catheterization 03/15/2012    Social History:  reports that he has never smoked. He has never used smokeless tobacco. He reports that he drinks alcohol. He reports that he does not use illicit drugs.  Allergies:  No Known Allergies  Medications: No current facility-administered medications for this encounter.   Current Outpatient Prescriptions  Medication Sig Dispense Refill  . amiodarone (PACERONE) 200 MG tablet Take  200 mg by mouth daily.        [name] aspirin 81 MG tablet Take 81 mg by mouth daily.       [name] Co-Enzyme Q-10 10 MG CAPS Take 1 capsule by mouth daily.        [name] diltiazem (DILACOR XR) 180 MG 24 hr capsule Take 180 mg by mouth daily.        [name] doxazosin (CARDURA) 8 MG tablet Take 4 mg by mouth every evening.      . ezetimibe-simvastatin (VYTORIN) 10-20 MG per tablet Take 1 tablet by mouth at bedtime.      . finasteride (PROSCAR) 5 MG tablet Take 5 mg by mouth daily.      [name] LORazepam (ATIVAN) 0.5 MG tablet Take 0.5 mg by mouth at bedtime.      . metoprolol succinate (TOPROL XL) 25 MG 24 hr tablet Take 1 tablet (25 mg total) by mouth 2 (two) times daily.  30 tablet  11  . mometasone (NASONEX) 50 MCG/ACT nasal spray Place 2 sprays into the nose daily as needed. For allergy flare up      . Multiple Vitamin (MULTIVITAMIN) capsule Take 1 capsule by mouth daily.        . niacin (NIASPAN) 1000 MG CR tablet Take 2,000 mg by mouth at bedtime.      [name]  nitroGLYCERIN (NITROSTAT) 0.4 MG SL tablet Place 0.4 mg under the tongue every 5 (five) minutes as needed. For chest pain      . Omega-3 Fatty Acids (FISH OIL) 1000 MG CAPS Take 1,000 capsules by mouth 2 (two) times daily.       . prasugrel (EFFIENT) 10 MG TABS Take 10 mg by mouth daily.       [name] warfarin (COUMADIN) 5 MG tablet Take 2.5-5 mg by mouth daily. Monday and Friday = 2.5 mg    All other days=5mg         Review of Systems  Constitutional: Negative.   HENT: Negative.   Eyes: Negative.   Respiratory: Negative.   Cardiovascular: Negative.   Gastrointestinal: Negative.   Genitourinary: Negative.   Musculoskeletal: Positive for joint pain.  Skin: Negative.   Neurological: Negative.   Endo/Heme/Allergies: Negative.   Psychiatric/Behavioral: Negative.      Physical Exam  Constitutional: He is oriented to person, place, and time and well-developed, well-nourished, and in no distress.  HENT:  Head: Normocephalic and atraumatic.  Nose: Nose normal.    Mouth/Throat: Oropharynx is clear and moist.  Eyes: Pupils are equal, round, and reactive to light.  Neck: Neck supple. No JVD present. No tracheal deviation present. No thyromegaly present.  Cardiovascular: Normal rate, regular rhythm and intact distal pulses.   Pulmonary/Chest: Effort normal and breath sounds normal. No respiratory distress. He has no wheezes.  Abdominal: Soft. There is no tenderness. There is no guarding.  Musculoskeletal:       Left knee: He exhibits decreased range of motion, swelling, effusion, ecchymosis, deformity and bony tenderness. He exhibits no laceration. tenderness found. No medial joint line, no lateral joint line and no patellar tendon tenderness noted.       Legs: Lymphadenopathy:    He has no cervical adenopathy.  Neurological: He is alert and oriented to person, place, and time.  Skin: Skin is warm and dry.  Psychiatric: Affect normal.      Assessment/Plan Assessment:   Left quad tendon rupture  Plan: Patient will undergo a open left quad tendon repair on 09/19/2012 per Dr. Charlann Boxer at [facility]. Risks benefits and expectations were discussed with the patient. Patient understand risks, benefits and expectations and wishes to proceed.   Anastasio Auerbach Calan Doren   PAC  09/16/2012, 11:10 AM

## 2012-09-16 NOTE — Progress Notes (Signed)
I spoke with Charles Robinson at Dr. Elissa Hefty office concerning Effient--per Dr. Herbie Baltimore: pt is to take PM dose of Effient on Sun 09/18/12 but not to take AM dose on Monday morning 09/19/12. Pt was informed per phone.

## 2012-09-19 ENCOUNTER — Ambulatory Visit (HOSPITAL_COMMUNITY): Payer: Medicare Other | Admitting: Anesthesiology

## 2012-09-19 ENCOUNTER — Encounter (HOSPITAL_COMMUNITY): Payer: Self-pay | Admitting: Anesthesiology

## 2012-09-19 ENCOUNTER — Encounter (HOSPITAL_COMMUNITY): Payer: Medicare Other

## 2012-09-19 ENCOUNTER — Encounter (HOSPITAL_COMMUNITY): Payer: Self-pay | Admitting: *Deleted

## 2012-09-19 ENCOUNTER — Encounter (HOSPITAL_COMMUNITY)
Admission: RE | Disposition: A | Discharge status: Home Health | Payer: Self-pay | Source: Ambulatory Visit | Attending: Orthopedic Surgery

## 2012-09-19 ENCOUNTER — Inpatient Hospital Stay (HOSPITAL_COMMUNITY)
Admission: RE | Admit: 2012-09-19 | Discharge: 2012-09-21 | DRG: 501 | Disposition: A | Discharge status: Home Health | Payer: Medicare Other | Source: Ambulatory Visit | Attending: Orthopedic Surgery | Admitting: Orthopedic Surgery

## 2012-09-19 DIAGNOSIS — I10 Essential (primary) hypertension: Secondary | ICD-10-CM | POA: Diagnosis present

## 2012-09-19 DIAGNOSIS — S86819A Strain of other muscle(s) and tendon(s) at lower leg level, unspecified leg, initial encounter: Principal | ICD-10-CM | POA: Diagnosis present

## 2012-09-19 DIAGNOSIS — E871 Hypo-osmolality and hyponatremia: Secondary | ICD-10-CM | POA: Diagnosis not present

## 2012-09-19 DIAGNOSIS — I2510 Atherosclerotic heart disease of native coronary artery without angina pectoris: Secondary | ICD-10-CM | POA: Diagnosis present

## 2012-09-19 DIAGNOSIS — S76119A Strain of unspecified quadriceps muscle, fascia and tendon, initial encounter: Secondary | ICD-10-CM

## 2012-09-19 DIAGNOSIS — Y92009 Unspecified place in unspecified non-institutional (private) residence as the place of occurrence of the external cause: Secondary | ICD-10-CM

## 2012-09-19 DIAGNOSIS — E669 Obesity, unspecified: Secondary | ICD-10-CM

## 2012-09-19 DIAGNOSIS — D62 Acute posthemorrhagic anemia: Secondary | ICD-10-CM | POA: Diagnosis not present

## 2012-09-19 DIAGNOSIS — I4891 Unspecified atrial fibrillation: Secondary | ICD-10-CM | POA: Diagnosis present

## 2012-09-19 DIAGNOSIS — D500 Iron deficiency anemia secondary to blood loss (chronic): Secondary | ICD-10-CM

## 2012-09-19 DIAGNOSIS — Z9861 Coronary angioplasty status: Secondary | ICD-10-CM

## 2012-09-19 DIAGNOSIS — S838X9A Sprain of other specified parts of unspecified knee, initial encounter: Principal | ICD-10-CM | POA: Diagnosis present

## 2012-09-19 DIAGNOSIS — W108XXA Fall (on) (from) other stairs and steps, initial encounter: Secondary | ICD-10-CM | POA: Diagnosis present

## 2012-09-19 HISTORY — PX: QUADRICEPS TENDON REPAIR: SHX756

## 2012-09-19 HISTORY — DX: Strain of unspecified quadriceps muscle, fascia and tendon, initial encounter: S76.119A

## 2012-09-19 LAB — ABO/RH: ABO/RH(D): B POS

## 2012-09-19 LAB — APTT: aPTT: 33 seconds (ref 24–37)

## 2012-09-19 LAB — BASIC METABOLIC PANEL
BUN: 22 mg/dL (ref 6–23)
CO2: 25 mEq/L (ref 19–32)
Calcium: 8.6 mg/dL (ref 8.4–10.5)
Chloride: 103 mEq/L (ref 96–112)
Creatinine, Ser: 1.3 mg/dL (ref 0.50–1.35)
GFR calc Af Amer: 64 mL/min — ABNORMAL LOW (ref >90)
GFR calc non Af Amer: 55 mL/min — ABNORMAL LOW (ref >90)
Glucose, Bld: 123 mg/dL — ABNORMAL HIGH (ref 70–99)
Potassium: 3.7 mEq/L (ref 3.5–5.1)
Sodium: 137 mEq/L (ref 135–145)

## 2012-09-19 LAB — TYPE AND SCREEN
ABO/RH(D): B POS
Antibody Screen: NEGATIVE

## 2012-09-19 LAB — SURGICAL PCR SCREEN
MRSA, PCR: NEGATIVE
Staphylococcus aureus: NEGATIVE

## 2012-09-19 LAB — URINALYSIS, ROUTINE W REFLEX MICROSCOPIC
Glucose, UA: NEGATIVE mg/dL
Leukocytes, UA: NEGATIVE
Nitrite: NEGATIVE
Protein, ur: 100 mg/dL — AB
Specific Gravity, Urine: 1.027 (ref 1.005–1.030)
Urobilinogen, UA: 2 mg/dL — ABNORMAL HIGH (ref 0.0–1.0)
pH: 6 (ref 5.0–8.0)

## 2012-09-19 LAB — PROTIME-INR
INR: 1.4 (ref 0.00–1.49)
Prothrombin Time: 16.8 seconds — ABNORMAL HIGH (ref 11.6–15.2)

## 2012-09-19 LAB — CBC
HCT: 29.2 % — ABNORMAL LOW (ref 39.0–52.0)
Hemoglobin: 9.9 g/dL — ABNORMAL LOW (ref 13.0–17.0)
MCH: 31.3 pg (ref 26.0–34.0)
MCHC: 33.9 g/dL (ref 30.0–36.0)
MCV: 92.4 fL (ref 78.0–100.0)
Platelets: 209 10*3/uL (ref 150–400)
RBC: 3.16 MIL/uL — ABNORMAL LOW (ref 4.22–5.81)
RDW: 14.3 % (ref 11.5–15.5)
WBC: 2.9 10*3/uL — ABNORMAL LOW (ref 4.0–10.5)

## 2012-09-19 LAB — URINE MICROSCOPIC-ADD ON

## 2012-09-19 SURGERY — REPAIR, TENDON, QUADRICEPS
Anesthesia: General | Site: Knee | Laterality: Left | Wound class: Clean

## 2012-09-19 MED ORDER — BUPIVACAINE-EPINEPHRINE 0.25% -1:200000 IJ SOLN
INTRAMUSCULAR | Status: DC | PRN
  Administered 2012-09-19: 50 mL

## 2012-09-19 MED ORDER — FENTANYL CITRATE 0.05 MG/ML IJ SOLN
INTRAMUSCULAR | Status: DC | PRN
  Administered 2012-09-19 (×2): 25 ug via INTRAVENOUS
  Administered 2012-09-19 (×2): 50 ug via INTRAVENOUS
  Administered 2012-09-19: 25 ug via INTRAVENOUS
  Administered 2012-09-19 (×2): 50 ug via INTRAVENOUS
  Administered 2012-09-19: 25 ug via INTRAVENOUS

## 2012-09-19 MED ORDER — MIDAZOLAM HCL 5 MG/5ML IJ SOLN
INTRAMUSCULAR | Status: DC | PRN
Start: 2012-09-19 — End: 2012-09-19
  Administered 2012-09-19: 2 mg via INTRAVENOUS

## 2012-09-19 MED ORDER — WARFARIN - PHARMACIST DOSING INPATIENT: Freq: Every day | Status: DC

## 2012-09-19 MED ORDER — ASPIRIN 81 MG PO TABS
81.0000 mg | ORAL_TABLET | Freq: Every day | ORAL | Status: DC
  Administered 2012-09-20: 81 mg via ORAL
  Filled 2012-09-19 (×3): qty 1

## 2012-09-19 MED ORDER — COUMADIN BOOK
Freq: Once | Status: AC
  Administered 2012-09-19: 22:00:00
  Filled 2012-09-19: qty 1

## 2012-09-19 MED ORDER — FINASTERIDE 5 MG PO TABS
5.0000 mg | ORAL_TABLET | Freq: Every day | ORAL | Status: DC
  Administered 2012-09-19 – 2012-09-21 (×3): 5 mg via ORAL
  Filled 2012-09-19 (×3): qty 1

## 2012-09-19 MED ORDER — DIPHENHYDRAMINE HCL 25 MG PO CAPS: 25.0000 mg | ORAL_CAPSULE | Freq: Four times a day (QID) | ORAL | Status: DC | PRN

## 2012-09-19 MED ORDER — TRAMADOL HCL 50 MG PO TABS
50.0000 mg | ORAL_TABLET | Freq: Four times a day (QID) | ORAL | Status: DC
  Administered 2012-09-19 – 2012-09-21 (×7): 100 mg via ORAL
  Filled 2012-09-19 (×12): qty 2

## 2012-09-19 MED ORDER — LACTATED RINGERS IV SOLN
INTRAVENOUS | Status: DC
Start: 2012-09-19 — End: 2012-09-19
  Administered 2012-09-19: 17:00:00 via INTRAVENOUS
  Administered 2012-09-19: 1000 mL via INTRAVENOUS

## 2012-09-19 MED ORDER — BISACODYL 10 MG RE SUPP: 10.0000 mg | Freq: Every day | RECTAL | Status: DC | PRN

## 2012-09-19 MED ORDER — FERROUS SULFATE 325 (65 FE) MG PO TABS
325.0000 mg | ORAL_TABLET | Freq: Three times a day (TID) | ORAL | Status: DC
Start: 2012-09-20 — End: 2012-09-21
  Administered 2012-09-20 – 2012-09-21 (×5): 325 mg via ORAL
  Filled 2012-09-19 (×7): qty 1

## 2012-09-19 MED ORDER — DOXAZOSIN MESYLATE 4 MG PO TABS
4.0000 mg | ORAL_TABLET | Freq: Every evening | ORAL | Status: DC
  Administered 2012-09-19 – 2012-09-20 (×2): 4 mg via ORAL
  Filled 2012-09-19 (×3): qty 1

## 2012-09-19 MED ORDER — CEFAZOLIN SODIUM-DEXTROSE 2-3 GM-% IV SOLR
INTRAVENOUS | Status: AC
  Filled 2012-09-19: qty 50

## 2012-09-19 MED ORDER — CEFAZOLIN SODIUM-DEXTROSE 2-3 GM-% IV SOLR
2.0000 g | INTRAVENOUS | Status: AC
  Administered 2012-09-19: 2 g via INTRAVENOUS

## 2012-09-19 MED ORDER — ONDANSETRON HCL 4 MG/2ML IJ SOLN
INTRAMUSCULAR | Status: DC | PRN
  Administered 2012-09-19: 4 mg via INTRAVENOUS

## 2012-09-19 MED ORDER — AMIODARONE HCL 200 MG PO TABS
200.0000 mg | ORAL_TABLET | Freq: Every day | ORAL | Status: DC
  Administered 2012-09-20 – 2012-09-21 (×2): 200 mg via ORAL
  Filled 2012-09-19 (×4): qty 1

## 2012-09-19 MED ORDER — KETOROLAC TROMETHAMINE 15 MG/ML IJ SOLN
15.0000 mg | Freq: Four times a day (QID) | INTRAMUSCULAR | Status: AC
  Administered 2012-09-19 – 2012-09-20 (×3): 15 mg via INTRAVENOUS
  Filled 2012-09-19 (×3): qty 1

## 2012-09-19 MED ORDER — NIACIN ER (ANTIHYPERLIPIDEMIC) 1000 MG PO TBCR: 2000.0000 mg | EXTENDED_RELEASE_TABLET | Freq: Every day | ORAL | Status: DC

## 2012-09-19 MED ORDER — HYDROMORPHONE HCL PF 1 MG/ML IJ SOLN
INTRAMUSCULAR | Status: AC
  Filled 2012-09-19: qty 1

## 2012-09-19 MED ORDER — METHOCARBAMOL 500 MG PO TABS
500.0000 mg | ORAL_TABLET | Freq: Four times a day (QID) | ORAL | Status: DC | PRN
  Filled 2012-09-19: qty 1

## 2012-09-19 MED ORDER — METHOCARBAMOL 100 MG/ML IJ SOLN
500.0000 mg | Freq: Four times a day (QID) | INTRAMUSCULAR | Status: DC | PRN
  Filled 2012-09-19: qty 5

## 2012-09-19 MED ORDER — HYDROMORPHONE HCL PF 1 MG/ML IJ SOLN
0.2500 mg | INTRAMUSCULAR | Status: DC | PRN
  Administered 2012-09-19: 0.5 mg via INTRAVENOUS
  Administered 2012-09-19 (×3): 0.25 mg via INTRAVENOUS

## 2012-09-19 MED ORDER — SODIUM CHLORIDE 0.9 % IV SOLN
INTRAVENOUS | Status: DC
  Administered 2012-09-19: 22:00:00 via INTRAVENOUS
  Filled 2012-09-19 (×3): qty 1000

## 2012-09-19 MED ORDER — PRASUGREL HCL 10 MG PO TABS
10.0000 mg | ORAL_TABLET | Freq: Every day | ORAL | Status: DC
  Administered 2012-09-20 – 2012-09-21 (×2): 10 mg via ORAL
  Filled 2012-09-19 (×4): qty 1

## 2012-09-19 MED ORDER — ONDANSETRON HCL 4 MG PO TABS
4.0000 mg | ORAL_TABLET | Freq: Four times a day (QID) | ORAL | Status: DC | PRN
  Administered 2012-09-20: 4 mg via ORAL
  Filled 2012-09-19: qty 1

## 2012-09-19 MED ORDER — LACTATED RINGERS IV SOLN
INTRAVENOUS | Status: DC
Start: 2012-09-19 — End: 2012-09-19

## 2012-09-19 MED ORDER — EZETIMIBE-SIMVASTATIN 10-20 MG PO TABS
1.0000 | ORAL_TABLET | Freq: Every day | ORAL | Status: DC
  Administered 2012-09-19 – 2012-09-20 (×2): 1 via ORAL
  Filled 2012-09-19 (×3): qty 1

## 2012-09-19 MED ORDER — 0.9 % SODIUM CHLORIDE (POUR BTL) OPTIME
TOPICAL | Status: DC | PRN
  Administered 2012-09-19: 1000 mL

## 2012-09-19 MED ORDER — CEFAZOLIN SODIUM-DEXTROSE 2-3 GM-% IV SOLR
2.0000 g | Freq: Four times a day (QID) | INTRAVENOUS | Status: AC
  Administered 2012-09-19 – 2012-09-20 (×2): 2 g via INTRAVENOUS
  Filled 2012-09-19 (×2): qty 50

## 2012-09-19 MED ORDER — WARFARIN VIDEO: Freq: Once | Status: DC

## 2012-09-19 MED ORDER — PROPOFOL 10 MG/ML IV BOLUS
INTRAVENOUS | Status: DC | PRN
  Administered 2012-09-19: 200 mg via INTRAVENOUS

## 2012-09-19 MED ORDER — NITROGLYCERIN 0.4 MG SL SUBL: 0.4000 mg | SUBLINGUAL_TABLET | SUBLINGUAL | Status: DC | PRN

## 2012-09-19 MED ORDER — NIACIN ER 500 MG PO CPCR
2000.0000 mg | ORAL_CAPSULE | Freq: Every day | ORAL | Status: DC
  Administered 2012-09-19 – 2012-09-20 (×2): 2000 mg via ORAL
  Filled 2012-09-19 (×3): qty 4

## 2012-09-19 MED ORDER — POLYETHYLENE GLYCOL 3350 17 G PO PACK
17.0000 g | PACK | Freq: Two times a day (BID) | ORAL | Status: DC
  Administered 2012-09-20 – 2012-09-21 (×2): 17 g via ORAL

## 2012-09-19 MED ORDER — SODIUM CHLORIDE 0.9 % IR SOLN
Status: DC | PRN
  Administered 2012-09-19: 1000 mL

## 2012-09-19 MED ORDER — WARFARIN SODIUM 5 MG PO TABS
5.0000 mg | ORAL_TABLET | Freq: Once | ORAL | Status: AC
  Administered 2012-09-19: 5 mg via ORAL
  Filled 2012-09-19: qty 1

## 2012-09-19 MED ORDER — MUPIROCIN 2 % EX OINT
TOPICAL_OINTMENT | Freq: Two times a day (BID) | CUTANEOUS | Status: DC
  Administered 2012-09-19: 1 via NASAL
  Filled 2012-09-19: qty 22

## 2012-09-19 MED ORDER — DOCUSATE SODIUM 100 MG PO CAPS
100.0000 mg | ORAL_CAPSULE | Freq: Two times a day (BID) | ORAL | Status: DC
  Administered 2012-09-19 – 2012-09-21 (×4): 100 mg via ORAL

## 2012-09-19 MED ORDER — ENOXAPARIN SODIUM 40 MG/0.4ML ~~LOC~~ SOLN
40.0000 mg | SUBCUTANEOUS | Status: DC
  Administered 2012-09-20 – 2012-09-21 (×2): 40 mg via SUBCUTANEOUS
  Filled 2012-09-19 (×3): qty 0.4

## 2012-09-19 MED ORDER — DEXAMETHASONE SODIUM PHOSPHATE 10 MG/ML IJ SOLN
10.0000 mg | Freq: Once | INTRAMUSCULAR | Status: DC
  Filled 2012-09-19: qty 1

## 2012-09-19 MED ORDER — METOPROLOL SUCCINATE ER 25 MG PO TB24
25.0000 mg | ORAL_TABLET | Freq: Two times a day (BID) | ORAL | Status: DC
  Administered 2012-09-19 – 2012-09-21 (×4): 25 mg via ORAL
  Filled 2012-09-19 (×5): qty 1

## 2012-09-19 MED ORDER — ACETAMINOPHEN 10 MG/ML IV SOLN
INTRAVENOUS | Status: DC | PRN
  Administered 2012-09-19: 1000 mg via INTRAVENOUS

## 2012-09-19 MED ORDER — ONDANSETRON HCL 4 MG/2ML IJ SOLN
4.0000 mg | Freq: Four times a day (QID) | INTRAMUSCULAR | Status: DC | PRN
  Filled 2012-09-19: qty 2

## 2012-09-19 MED ORDER — ACETAMINOPHEN 10 MG/ML IV SOLN
INTRAVENOUS | Status: AC
  Filled 2012-09-19: qty 100

## 2012-09-19 MED ORDER — ZOLPIDEM TARTRATE 5 MG PO TABS: 5.0000 mg | ORAL_TABLET | Freq: Every evening | ORAL | Status: DC | PRN

## 2012-09-19 MED ORDER — DILTIAZEM HCL ER 180 MG PO CP24
180.0000 mg | ORAL_CAPSULE | Freq: Every day | ORAL | Status: DC
  Administered 2012-09-20 – 2012-09-21 (×2): 180 mg via ORAL
  Filled 2012-09-19 (×4): qty 1

## 2012-09-19 MED ORDER — PHENOL 1.4 % MT LIQD
1.0000 | OROMUCOSAL | Status: DC | PRN
  Filled 2012-09-19: qty 177

## 2012-09-19 MED ORDER — MENTHOL 3 MG MT LOZG
1.0000 | LOZENGE | OROMUCOSAL | Status: DC | PRN
  Filled 2012-09-19: qty 9

## 2012-09-19 MED ORDER — LIDOCAINE HCL (CARDIAC) 20 MG/ML IV SOLN
INTRAVENOUS | Status: DC | PRN
  Administered 2012-09-19: 80 mg via INTRAVENOUS

## 2012-09-19 MED ORDER — BUPIVACAINE-EPINEPHRINE 0.25% -1:200000 IJ SOLN
INTRAMUSCULAR | Status: AC
  Filled 2012-09-19: qty 1

## 2012-09-19 MED ORDER — ALUM & MAG HYDROXIDE-SIMETH 200-200-20 MG/5ML PO SUSP: 30.0000 mL | ORAL | Status: DC | PRN

## 2012-09-19 MED ORDER — FLEET ENEMA 7-19 GM/118ML RE ENEM: 1.0000 | ENEMA | Freq: Once | RECTAL | Status: AC | PRN

## 2012-09-19 MED ORDER — CHLORHEXIDINE GLUCONATE 4 % EX LIQD
60.0000 mL | Freq: Once | CUTANEOUS | Status: DC
  Filled 2012-09-19: qty 60

## 2012-09-19 SURGICAL SUPPLY — 54 items
ADH SKN CLS APL DERMABOND .7 (GAUZE/BANDAGES/DRESSINGS) ×1
BAG SPEC THK2 15X12 ZIP CLS (MISCELLANEOUS) ×1
BAG ZIPLOCK 12X15 (MISCELLANEOUS) ×2 IMPLANT
BANDAGE ELASTIC 6 VELCRO ST LF (GAUZE/BANDAGES/DRESSINGS) ×1 IMPLANT
BIT DRILL 2.8X128 (BIT) ×2 IMPLANT
CLOTH BEACON ORANGE TIMEOUT ST (SAFETY) ×2 IMPLANT
CUFF TOURN SGL QUICK 34 (TOURNIQUET CUFF)
CUFF TRNQT CYL 34X4X40X1 (TOURNIQUET CUFF) IMPLANT
DERMABOND ADVANCED (GAUZE/BANDAGES/DRESSINGS) ×1
DERMABOND ADVANCED .7 DNX12 (GAUZE/BANDAGES/DRESSINGS) IMPLANT
DRAPE POUCH INSTRU U-SHP 10X18 (DRAPES) ×1 IMPLANT
DRAPE U-SHAPE 47X51 STRL (DRAPES) ×2 IMPLANT
DRSG AQUACEL AG ADV 3.5X10 (GAUZE/BANDAGES/DRESSINGS) ×1 IMPLANT
DRSG TEGADERM 4X4.75 (GAUZE/BANDAGES/DRESSINGS) ×1 IMPLANT
DURAPREP 26ML APPLICATOR (WOUND CARE) ×2 IMPLANT
ELECT REM PT RETURN 9FT ADLT (ELECTROSURGICAL) ×2
ELECTRODE REM PT RTRN 9FT ADLT (ELECTROSURGICAL) ×1 IMPLANT
FACESHIELD LNG OPTICON STERILE (SAFETY) ×2 IMPLANT
GAUZE SPONGE 2X2 8PLY STRL LF (GAUZE/BANDAGES/DRESSINGS) IMPLANT
GLOVE BIOGEL PI IND STRL 7.5 (GLOVE) ×1 IMPLANT
GLOVE BIOGEL PI IND STRL 8 (GLOVE) ×1 IMPLANT
GLOVE BIOGEL PI INDICATOR 7.5 (GLOVE) ×1
GLOVE BIOGEL PI INDICATOR 8 (GLOVE) ×1
GLOVE ECLIPSE 8.0 STRL XLNG CF (GLOVE) ×1 IMPLANT
GLOVE ORTHO TXT STRL SZ7.5 (GLOVE) ×4 IMPLANT
GOWN BRE IMP PREV XXLGXLNG (GOWN DISPOSABLE) ×4 IMPLANT
GOWN STRL NON-REIN LRG LVL3 (GOWN DISPOSABLE) ×2 IMPLANT
HANDPIECE INTERPULSE COAX TIP (DISPOSABLE) ×2
IMMOBILIZER KNEE 20 (SOFTGOODS) ×2
IMMOBILIZER KNEE 20 THIGH 36 (SOFTGOODS) ×1 IMPLANT
KIT BASIN OR (CUSTOM PROCEDURE TRAY) ×2 IMPLANT
MANIFOLD NEPTUNE II (INSTRUMENTS) ×2 IMPLANT
NDL SAFETY ECLIPSE 18X1.5 (NEEDLE) IMPLANT
NEEDLE HYPO 18GX1.5 SHARP (NEEDLE) ×2
NS IRRIG 1000ML POUR BTL (IV SOLUTION) ×2 IMPLANT
PACK LOWER EXTREMITY WL (CUSTOM PROCEDURE TRAY) ×2 IMPLANT
PASSER SUT SWANSON 36MM LOOP (INSTRUMENTS) ×2 IMPLANT
POSITIONER SURGICAL ARM (MISCELLANEOUS) ×2 IMPLANT
SET HNDPC FAN SPRY TIP SCT (DISPOSABLE) IMPLANT
SPONGE GAUZE 2X2 STER 10/PKG (GAUZE/BANDAGES/DRESSINGS) ×1
SPONGE LAP 18X18 X RAY DECT (DISPOSABLE) ×2 IMPLANT
STAPLER VISISTAT 35W (STAPLE) ×1 IMPLANT
STOCKINETTE 8 INCH (MISCELLANEOUS) ×1 IMPLANT
SUT ETHIBOND 2 OS 4 DA (SUTURE) ×2 IMPLANT
SUT ETHIBOND 5 LR DA (SUTURE) ×2 IMPLANT
SUT FIBERWIRE #2 38 T-5 BLUE (SUTURE) ×4
SUT FIBERWIRE #5 38 BLUE (WIRE) IMPLANT
SUT MNCRL AB 4-0 PS2 18 (SUTURE) ×1 IMPLANT
SUT VIC AB 1 CT1 36 (SUTURE) ×3 IMPLANT
SUT VIC AB 2-0 CT1 27 (SUTURE) ×6
SUT VIC AB 2-0 CT1 TAPERPNT 27 (SUTURE) IMPLANT
SUTURE FIBERWR #2 38 T-5 BLUE (SUTURE) IMPLANT
SYR 50ML LL SCALE MARK (SYRINGE) ×1 IMPLANT
TOWEL OR 17X26 10 PK STRL BLUE (TOWEL DISPOSABLE) ×4 IMPLANT

## 2012-09-19 NOTE — Anesthesia Postprocedure Evaluation (Signed)
  Anesthesia Post-op Note  Patient: Charles Robinson  Procedure(s) Performed: Procedure(s) (LRB): REPAIR QUADRICEP TENDON (Left)  Patient Location: PACU  Anesthesia Type: General  Level of Consciousness: awake and alert   Airway and Oxygen Therapy: Patient Spontanous Breathing  Post-op Pain: mild  Post-op Assessment: Post-op Vital signs reviewed, Patient's Cardiovascular Status Stable, Respiratory Function Stable, Patent Airway and No signs of Nausea or vomiting  Post-op Vital Signs: stable  Complications: No apparent anesthesia complications

## 2012-09-19 NOTE — Preoperative (Signed)
Beta Blockers   Reason not to administer Beta Blockers:Not Applicable 

## 2012-09-19 NOTE — Transfer of Care (Signed)
Immediate Anesthesia Transfer of Care Note  Patient: Charles Robinson  Procedure(s) Performed: Procedure(s) (LRB) with comments: REPAIR QUADRICEP TENDON (Left)  Patient Location: PACU  Anesthesia Type:General  Level of Consciousness: awake, alert , oriented and patient cooperative  Airway & Oxygen Therapy: Patient Spontanous Breathing and Patient connected to face mask oxygen  Post-op Assessment: Report given to PACU RN and Post -op Vital signs reviewed and stable  Post vital signs: Reviewed and stable  Complications: No apparent anesthesia complications

## 2012-09-19 NOTE — Brief Op Note (Signed)
09/19/2012  6:19 PM  PATIENT:  Charles Robinson  67 y.o. male  PRE-OPERATIVE DIAGNOSIS:  Left Quadricep Tendon Rupture  POST-OPERATIVE DIAGNOSIS:  Left Quadricep Tendon Rupture  PROCEDURE:  Procedure(s) (LRB) with comments: REPAIR QUADRICEP TENDON (Left)  SURGEON:  Surgeon(s) and Role:    * [name], MD - Primary  PHYSICIAN ASSISTANT: Lanney Gins, PA-C  ANESTHESIA:   general  EBL:  Total I/O In: 1100 [I.V.:1100] Out: -   BLOOD ADMINISTERED:none  DRAINS: none   LOCAL MEDICATIONS USED:  MARCAINE     SPECIMEN:  No Specimen  DISPOSITION OF SPECIMEN:  N/A  COUNTS:  YES  TOURNIQUET:   Total Tourniquet Time Documented: Thigh (Left) - 52 minutes  DICTATION: .Other Dictation: Dictation Number [id number]  PLAN OF CARE: Admit for overnight observation  PATIENT DISPOSITION:  PACU - hemodynamically stable.   Delay start of Pharmacological VTE agent (>24hrs) due to surgical blood loss or risk of bleeding: no

## 2012-09-19 NOTE — Interval H&P Note (Signed)
History and Physical Interval Note:  09/19/2012 3:56 PM  Charles Robinson  has presented today for surgery, with the diagnosis of Left Quad Tendon Rupture  The various methods of treatment have been discussed with the patient and family. After consideration of risks, benefits and other options for treatment, the patient has consented to  Procedure(s) (LRB) with comments: REPAIR QUADRICEP TENDON (Left) - Open Repair of Left Quad Tendon as a surgical intervention .  The patient's history has been reviewed, patient examined, no change in status, stable for surgery.  I have reviewed the patient's chart and labs.  Questions were answered to the patient's satisfaction.     [name]

## 2012-09-19 NOTE — Anesthesia Preprocedure Evaluation (Addendum)
Anesthesia Evaluation  Patient identified by MRN, date of birth, ID band Patient awake    Reviewed: Allergy & Precautions, H&P , NPO status , Patient's Chart, lab work & pertinent test results, reviewed documented beta blocker date and time   Airway Mallampati: II TM Distance: >3 FB Neck ROM: full    Dental No notable dental hx. (+) Teeth Intact and Dental Advisory Given   Pulmonary neg pulmonary ROS,  breath sounds clear to auscultation  Pulmonary exam normal       Cardiovascular Exercise Tolerance: Good hypertension, Pt. on medications and Pt. on home beta blockers + CAD + dysrhythmias Atrial Fibrillation Rhythm:regular Rate:Normal  S/p PCI to LAD, RCA, LCx-OM1 ( entire prox-AVG Circ for dissection in 9/12 ).  No cardiac problems recently.   Neuro/Psych Back surgery negative neurological ROS  negative psych ROS   GI/Hepatic negative GI ROS, Neg liver ROS,   Endo/Other  negative endocrine ROS  Renal/GU negative Renal ROS  negative genitourinary   Musculoskeletal   Abdominal   Peds  Hematology negative hematology ROS (+)   Anesthesia Other Findings   Reproductive/Obstetrics negative OB ROS                          Anesthesia Physical Anesthesia Plan  ASA: III  Anesthesia Plan: General   Post-op Pain Management:    Induction: Intravenous  Airway Management Planned: LMA  Additional Equipment:   Intra-op Plan:   Post-operative Plan:   Informed Consent: I have reviewed the patients History and Physical, chart, labs and discussed the procedure including the risks, benefits and alternatives for the proposed anesthesia with the patient or authorized representative who has indicated his/her understanding and acceptance.   Dental Advisory Given  Plan Discussed with: CRNA and Surgeon  Anesthesia Plan Comments:         Anesthesia Quick Evaluation

## 2012-09-19 NOTE — Progress Notes (Addendum)
ANTICOAGULATION CONSULT NOTE - Initial Consult  Pharmacy Consult for Coumadin Indication: VTE prophylaxis  Allergies  Allergen Reactions  . Morphine And Related Nausea And Vomiting  . Vicodin (Hydrocodone-Acetaminophen) Other (See Comments)    Feel crazy    Patient Measurements:    Vital Signs: Temp: 98.1 F (36.7 C) (10/28 1954) BP: 187/76 mmHg (10/28 1954) Pulse Rate: 74  (10/28 1954)  Labs:  Basename 09/19/12 1430  HGB 9.9*  HCT 29.2*  PLT 209  APTT 33  LABPROT 16.8*  INR 1.40  HEPARINUNFRC --  CREATININE 1.30  CKTOTAL --  CKMB --  TROPONINI --    The CrCl is unknown because both a height and weight (above a minimum accepted value) are required for this calculation.   Medical History: Past Medical History  Diagnosis Date  . History of colon polyps   . Hyperlipidemia   . Hypertension   . BPH (benign prostatic hypertrophy)   . Cancer     SKIN CA  . Coronary artery disease     s/p PCI to LAD, to RCA, then extensive PCI to LCx-OM1  (enitre prox-AVG Circ for dissection in 07/2011)  . Paroxysmal atrial fibrillation     followed by Dr. Bryan Lemma  . Rupture quadriceps tendon     Assessment:  66 yom on Coumadin PTA for h/o atrial fibrillation.  Patient was reportedly taking Coumadin 2.5mg  on MWF and 5mg  other days of the week.  Last dose 10/24 in preparation for L quadricept tendon rupture repair 10/28.  AET @ 1759. MD ordered to resume Coumadin post-op and also ordered for Lovenox 40 mg sq q24h until INR >/= 1.8.  Baseline INR = 1.4 today. Hgb 9.9.   Drug-drug interaction with home Amiodarone and Effient noted.    Goal of Therapy:  INR 2-3 Monitor platelets by anticoagulation protocol: Yes   Plan:   Coumadin  5 mg po x 1 tonight   Continue Lovenox 40 mg sq q24h until INR >= 1.8  Daily PT/INR  Coumadin book/video  Pharmacy will f/u for coumadin education later this week.   Geoffry Paradise, PharmD, BCPS Pager: [phone] 8:22 PM Pharmacy #:  [phone]

## 2012-09-20 ENCOUNTER — Encounter (HOSPITAL_COMMUNITY): Payer: Self-pay | Admitting: Orthopedic Surgery

## 2012-09-20 DIAGNOSIS — D500 Iron deficiency anemia secondary to blood loss (chronic): Secondary | ICD-10-CM

## 2012-09-20 DIAGNOSIS — E669 Obesity, unspecified: Secondary | ICD-10-CM

## 2012-09-20 LAB — CBC
HCT: 29.1 % — ABNORMAL LOW (ref 39.0–52.0)
Hemoglobin: 9.7 g/dL — ABNORMAL LOW (ref 13.0–17.0)
MCH: 31.2 pg (ref 26.0–34.0)
MCHC: 33.3 g/dL (ref 30.0–36.0)
MCV: 93.6 fL (ref 78.0–100.0)
Platelets: 199 10*3/uL (ref 150–400)
RBC: 3.11 MIL/uL — ABNORMAL LOW (ref 4.22–5.81)
RDW: 14.4 % (ref 11.5–15.5)
WBC: 5.2 10*3/uL (ref 4.0–10.5)

## 2012-09-20 LAB — BASIC METABOLIC PANEL
BUN: 20 mg/dL (ref 6–23)
CO2: 27 mEq/L (ref 19–32)
Calcium: 8.2 mg/dL — ABNORMAL LOW (ref 8.4–10.5)
Chloride: 103 mEq/L (ref 96–112)
Creatinine, Ser: 1.3 mg/dL (ref 0.50–1.35)
GFR calc Af Amer: 64 mL/min — ABNORMAL LOW (ref >90)
GFR calc non Af Amer: 55 mL/min — ABNORMAL LOW (ref >90)
Glucose, Bld: 102 mg/dL — ABNORMAL HIGH (ref 70–99)
Potassium: 3.7 mEq/L (ref 3.5–5.1)
Sodium: 138 mEq/L (ref 135–145)

## 2012-09-20 LAB — PROTIME-INR
INR: 1.41 (ref 0.00–1.49)
Prothrombin Time: 16.9 seconds — ABNORMAL HIGH (ref 11.6–15.2)

## 2012-09-20 MED ORDER — WARFARIN SODIUM 5 MG PO TABS
5.0000 mg | ORAL_TABLET | Freq: Once | ORAL | Status: AC
  Administered 2012-09-20: 5 mg via ORAL
  Filled 2012-09-20: qty 1

## 2012-09-20 NOTE — Progress Notes (Signed)
OT Screen Order received, chart reviewed. Spoke briefly with pt and pt's wife who stated they have all necessary DME and prn A at home. Pt presents with no OT needs at this time. Will sign off.  Garrel Ridgel, OTR/L  Pager [phone] 09/20/2012

## 2012-09-20 NOTE — Op Note (Signed)
NAMEDARSHAN, SOLANKI NO.:  [id number]  MEDICAL RECORD NO.:  [id number]  LOCATION:  1606                         FACILITY:  [facility]  PHYSICIAN:  Madlyn Frankel. Charlann Boxer, M.D.  DATE OF BIRTH:  1946-09-08  DATE OF PROCEDURE:  09/19/2012 DATE OF DISCHARGE:                              OPERATIVE REPORT   PREOPERATIVE DIAGNOSIS:  Left quadriceps tendon rupture.  POSTOPERATIVE DIAGNOSIS:  Left quadriceps tendon rupture.  PROCEDURE:  Open repair of left quadriceps tendon utilizing #2 FiberWire sutures through drill holes in the patella.  SURGEON:  Madlyn Frankel. Charlann Boxer, M.D.  ASSISTANT:  Lanney Gins, PA-C.  Of note, Mr. Carmon Sails was present for the entirety of the case, utilized for management of the left lower extremity, soft tissue retraction, as well as primary wound closure and general facilitation of the case.  ANESTHESIA:  General.  SPECIMENS:  None.  COMPLICATIONS:  None.  INDICATIONS FOR PROCEDURE:  Mr. Harb is a 66 year old gentleman who unfortunately slipped on steps and attempted recovery with an eccentric load, had felt a large pop sensations in left thigh.  He subsequently developed inability to extend his leg.  He was seen and evaluated in the office and an MRI was ordered to confirm diagnosis.  He was then seen in followup.  I reviewed with him the diagnosis, the indications.  He is on significant blood thinners including Coumadin. We asked that he hold his Coumadin and schedule surgery 3 days later. Risks, benefits, necessity of the procedure as well as postoperative course were reviewed.  Consent was obtained for benefit of fixation of tendon.  PROCEDURE IN DETAIL:  The patient was brought to the operative theater. Once adequate anesthesia, preoperative antibiotics, Ancef 2 g administered, he was positioned supine.  Left thigh tourniquet was placed.  Left lower extremity was then prepped and draped in sterile fashion.  A time-out was performed  identifying the patient, planned procedure, and extremity.  Midline incision was made over the knee. Soft tissue plane created.  Immediate identification of large hematoma as well as coagulated hematoma was identified and removed.  I chose at this point to irrigate the knee with pulse lavage with approximately 1 L of normal saline solution in an effort to debride the knee further.  At this point, we identified a large retinacular tearing medially and laterally in addition to the completely ruptured quad tendon.  I removed some of the soft tissues from the proximal pole of the patella to facilitate bone tendon healing.  I then placed two #2 FiberWire sutures through the quadriceps tendon in a Krackow suture pattern with 4 strands out the distal end of the quadriceps rupture.  Three drill holes were then made into the patella and the sutures were then passed with 1 medial, 1 lateral, and 2 central.  At this point, the tendon was reapproximated to the patella by direct reapproximation and the 2 medial suture strands were sutured down and then the lateral sutures.  I then placed the 2 suture lines in the midline underneath the tendon and reapproximated these ends.  At this point, I used #1 Vicryl and reapproximated the medial and lateral retinacular tissues.  Following this,  I injected the knee with 0.25% Marcaine with epinephrine, approximately 50 mL.  We then reapproximated the subcutaneous tissue using 2-0 Vicryl, 4-0 running Monocryl on the skin.  The skin was cleaned, dried, dressed sterilely using Dermabond and Aquacel dressing.  His knee was then wrapped into an Ace wrap for compression.  He was then brought to recovery room in stable condition, extubated tolerating the procedure well.  We will start him back up on his Coumadin.  He will be on Lovenox as a bridge.  He will be started back up.  I would anticipate fairly complex or prolonged recovery due to most likely  postoperative hemarthrosis may be needed to be evacuated in the office.  Nonetheless, we will keep him at full extension for 4 weeks due to the size of the extremity to allow for quad tendon healing to bone and then work on gentle range of motion with therapy.     Madlyn Frankel Charlann Boxer, M.D.     MDO/MEDQ  D:  09/19/2012  T:  09/20/2012  Job:  [id number]

## 2012-09-20 NOTE — Evaluation (Signed)
Physical Therapy Evaluation Patient Details Name: Charles Robinson MRN: [id number] DOB: 28-Feb-1946 Today's Date: 09/20/2012 Time: 7829-5621 PT Time Calculation (min): 22 min  PT Assessment / Plan / Recommendation Clinical Impression  Pt s/p L quad tendon repair.  Pt would benefit from acute PT services in order to improve independence with transfers and ambulation to prepare for d/c home.  While pt reports he can stay in bedroom on level ground, he would like to attempt stairs for practice if remains in hospital.      PT Assessment  Patient needs continued PT services    Follow Up Recommendations  Home health PT (per f/u with ortho MD)    Does the patient have the potential to tolerate intense rehabilitation      Barriers to Discharge        Equipment Recommendations  None recommended by PT    Recommendations for Other Services     Frequency Min 6X/week    Precautions / Restrictions Precautions Precautions: Knee Precaution Comments: no knee flexion Required Braces or Orthoses: Other Brace/Splint (T-ROM brace on at all times) Restrictions Weight Bearing Restrictions: Yes LLE Weight Bearing: Weight bearing as tolerated Other Position/Activity Restrictions: with T-ROM on at all times   Pertinent Vitals/Pain 1/10 L leg pain, RN in room when asked about pain, repositioned back in supine      Mobility  Bed Mobility Bed Mobility: Supine to Sit;Sit to Supine Supine to Sit: 4: Min assist Sit to Supine: 4: Min guard Details for Bed Mobility Assistance: assist for L LE support, also educated pt to use sheet if needed at home to assist Transfers Transfers: Stand to Sit;Sit to Stand Sit to Stand: 4: Min guard;With upper extremity assist;From bed Stand to Sit: 4: Min guard;With upper extremity assist;To bed Details for Transfer Assistance: verbal cues for hand placement and keeping L LE forward Ambulation/Gait Ambulation/Gait Assistance: 4: Min guard Ambulation Distance (Feet):  80 Feet Assistive device: Rolling walker Ambulation/Gait Assistance Details: pt initially keeping L LE forward during ambulation however instructed in step to pattern, increased use of UEs on RW for WBing. Gait Pattern: Step-to pattern;Antalgic General Gait Details: pt reports increased fatigue with activity level since he hasn't been moving all that much at home while waiting on getting tendon repaired    Shoulder Instructions     Exercises     PT Diagnosis: Difficulty walking  PT Problem List: Decreased strength;Decreased activity tolerance;Decreased mobility;Decreased knowledge of use of DME PT Treatment Interventions: DME instruction;Gait training;Functional mobility training;Stair training;Patient/family education;Therapeutic activities;Therapeutic exercise   PT Goals Acute Rehab PT Goals PT Goal Formulation: With patient Time For Goal Achievement: 09/24/12 Potential to Achieve Goals: Good Pt will go Supine/Side to Sit: with modified independence PT Goal: Supine/Side to Sit - Progress: Goal set today Pt will go Sit to Supine/Side: with modified independence PT Goal: Sit to Supine/Side - Progress: Goal set today Pt will go Sit to Stand: with modified independence PT Goal: Sit to Stand - Progress: Goal set today Pt will go Stand to Sit: with modified independence PT Goal: Stand to Sit - Progress: Goal set today Pt will Ambulate: 51 - 150 feet;with supervision;with least restrictive assistive device PT Goal: Ambulate - Progress: Goal set today Pt will Go Up / Down Stairs: 3-5 stairs;with supervision;with least restrictive assistive device PT Goal: Up/Down Stairs - Progress: Goal set today  Visit Information  Last PT Received On: 09/20/12 Assistance Needed: +1    Subjective Data  Subjective: I've never used a  RW.   Prior Functioning  Home Living Lives With: Spouse Type of Home: House Home Access: Stairs to enter Home Layout: Multi-level Home Adaptive Equipment: Shower  chair without back;Walker - rolling;Crutches;Other (comment) (toilet raiser) Additional Comments: Pt and spouse report pt can stay on lower level which has bedroom and requires no stairs until doing better. Prior Function Level of Independence: Independent Communication Communication: No difficulties    Cognition  Overall Cognitive Status: Appears within functional limits for tasks assessed/performed Arousal/Alertness: Awake/alert Orientation Level: Appears intact for tasks assessed Behavior During Session: [facility] for tasks performed    Extremity/Trunk Assessment Right Upper Extremity Assessment RUE ROM/Strength/Tone: [facility] for tasks assessed Left Upper Extremity Assessment LUE ROM/Strength/Tone: [facility] for tasks assessed Right Lower Extremity Assessment RLE ROM/Strength/Tone: [facility] for tasks assessed Left Lower Extremity Assessment LLE ROM/Strength/Tone: Deficits;Unable to fully assess;Due to precautions LLE ROM/Strength/Tone Deficits: L quad tendon repair, no knee flexion, brace locked in extension   Balance    End of Session PT - End of Session Equipment Utilized During Treatment: Other (comment) (T-ROM brace) Activity Tolerance: Patient tolerated treatment well Patient left: in bed;with call bell/phone within reach Nurse Communication: Patient requests pain meds  GP     Dinita Migliaccio,KATHrine E 09/20/2012, 10:06 AM Pager: [phone]

## 2012-09-20 NOTE — Progress Notes (Signed)
Utilization review completed.  

## 2012-09-20 NOTE — Progress Notes (Signed)
ANTICOAGULATION CONSULT NOTE  Pharmacy Consult for Coumadin Indication: Atrial Fibrillation  Allergies  Allergen Reactions  . Morphine And Related Nausea And Vomiting  . Vicodin (Hydrocodone-Acetaminophen) Other (See Comments)    Feel crazy    Patient Measurements: Height: 6\' 3"  (190.5 cm) Weight: 260 lb (117.935 kg) IBW/kg (Calculated) : 84.5   Vital Signs: Temp: 98.2 F (36.8 C) (10/29 0527) BP: 113/74 mmHg (10/29 0527) Pulse Rate: 69  (10/29 0527)  Labs:  Basename 09/20/12 0447 09/20/12 0420 09/19/12 1430  HGB -- 9.7* 9.9*  HCT -- 29.1* 29.2*  PLT -- 199 209  APTT -- -- 33  LABPROT 16.9* -- 16.8*  INR 1.41 -- 1.40  HEPARINUNFRC -- -- --  CREATININE -- 1.30 1.30  CKTOTAL -- -- --  CKMB -- -- --  TROPONINI -- -- --    Estimated Creatinine Clearance: 75.3 ml/min (by C-G formula based on Cr of 1.3).   Medical History: Past Medical History  Diagnosis Date  . History of colon polyps   . Hyperlipidemia   . Hypertension   . BPH (benign prostatic hypertrophy)   . Cancer     SKIN CA  . Coronary artery disease     s/p PCI to LAD, to RCA, then extensive PCI to LCx-OM1  (enitre prox-AVG Circ for dissection in 07/2011)  . Paroxysmal atrial fibrillation     followed by Dr. Bryan Lemma  . Rupture quadriceps tendon     Assessment:  66 yom on Coumadin PTA for paroxysmal atrial fibrillation (most recent regimen 2.5mg  on MWF and 5mg  other days of the week, managed by [facility].  Last dose 10/24 in preparation for L quadricept tendon rupture repair 10/28.     Warfarin re-initiation in progress (5mg  given 10/28).  Drug-drug interaction with home Amiodarone and Effient/ASA noted.  (Has coronary artery stents).   Goal of Therapy:  INR 2-3 Monitor platelets by anticoagulation protocol: Yes   Plan:   Coumadin 5 mg po x 1 tonight   Continue Lovenox 40 mg sq q24h until INR >= 1.8  Daily PT/INR  Coumadin teaching reinforced  today.  Elie Goody, PharmD, BCPS Pager: [phone] 09/20/2012  10:09 AM

## 2012-09-20 NOTE — Progress Notes (Signed)
   Subjective: 1 Day Post-Op Procedure(s) (LRB): REPAIR QUADRICEP TENDON (Left)   Patient reports pain as mild, pain well controlled at this time. No events throughout the night.   Objective:   VITALS:   Filed Vitals:   09/20/12 1046  BP: 128/69  Pulse: 69  Temp: 97.6 F (36.4 C)  Resp: 16    Neurovascular intact Dorsiflexion/Plantar flexion intact Incision: dressing C/D/I No cellulitis present Compartment soft  LABS  Basename 09/20/12 0420 09/19/12 1430  HGB 9.7* 9.9*  HCT 29.1* 29.2*  WBC 5.2 2.9*  PLT 199 209     Basename 09/20/12 0420 09/19/12 1430  NA 138 137  K 3.7 3.7  BUN 20 22  CREATININE 1.30 1.30  GLUCOSE 102* 123*     Assessment/Plan: 1 Day Post-Op Procedure(s) (LRB): REPAIR QUADRICEP TENDON (Left) HV drain d/c'ed Advance diet Up with therapy D/C IV fluids Discharge home with home health tomorrow if continues to do well.  Expected ABLA  Treated with iron and will observe  Obese (BMI 30-39.9) Estimated Body mass index is 32.50 kg/(m^2) as calculated from the following:   Height as of this encounter: 6\' 3" (1.905 m).   Weight as of this encounter: 260 lb(117.935 kg). Patient also counseled that weight may inhibit the healing process Patient counseled that losing weight will help with future health issues    Anastasio Auerbach. Raistlin Gum   PAC  09/20/2012, 1:23 PM

## 2012-09-20 NOTE — Progress Notes (Signed)
Physical Therapy Treatment Note   09/20/12 1400  PT Visit Information  Last PT Received On 09/20/12  Assistance Needed +1  PT Time Calculation  PT Start Time 1320  PT Stop Time 1330  PT Time Calculation (min) 10 min  Subjective Data  Subjective I don't like the walker on the stairs.  Precautions  Precautions Knee  Precaution Comments no knee flexion  Required Braces or Orthoses Other Brace/Splint ((T-ROM brace on at all times)  Restrictions  LLE Weight Bearing WBAT  Other Position/Activity Restrictions with T-ROM on at all times  Cognition  Overall Cognitive Status Appears within functional limits for tasks assessed/performed  Bed Mobility  Bed Mobility Supine to Sit;Sit to Supine  Supine to Sit 5: Supervision  Sit to Supine 5: Supervision  Details for Bed Mobility Assistance pt used L LE to assist R LE  Transfers  Transfers Stand to Sit;Sit to Stand  Sit to Stand 4: Min guard;With upper extremity assist;From bed  Stand to Sit 4: Min guard;With upper extremity assist;To bed  Details for Transfer Assistance verbal cues for hand placement and keeping L LE forward  Ambulation/Gait  Ambulation/Gait Assistance 4: Min guard  Ambulation Distance (Feet) 80 Feet  Assistive device Rolling walker  Ambulation/Gait Assistance Details pt did better with sequence after one cue  Gait Pattern Step-to pattern;Antalgic  Stairs Yes  Stairs Assistance 4: Min guard  Stairs Assistance Details (indicate cue type and reason) pt educated in RW backwards technique however did not feel comfortable with so reports he will use one crutch and rail (reports he has been doing that at home and declined performing here)  Stair Management Technique Step to pattern;Backwards;With walker  Number of Stairs 2   PT - End of Session  Equipment Utilized During Treatment Other (comment) (T-ROM brace)  Activity Tolerance Patient tolerated treatment well  Patient left in bed;with call bell/phone within reach;with  family/visitor present  PT - Assessment/Plan  Comments on Treatment Session Pt ambulated again and performed stairs with RW backwards.  Pt did not feel comfortable with this technique and reports he will use crutch and rail at home (states he has been doing that since injury) and declined performing in hospital.  Pt feels ready for d/c home today.  PT Plan Discharge plan remains appropriate;Frequency remains appropriate  Follow Up Recommendations Home health PT (per f/u with ortho MD))  Equipment Recommended None recommended by PT  Acute Rehab PT Goals  PT Goal: Supine/Side to Sit - Progress Progressing toward goal  PT Goal: Sit to Supine/Side - Progress Progressing toward goal  PT Goal: Sit to Stand - Progress Progressing toward goal  PT Goal: Stand to Sit - Progress Progressing toward goal  PT Goal: Ambulate - Progress Progressing toward goal  PT Goal: Up/Down Stairs - Progress Progressing toward goal  PT General Charges  $$ ACUTE PT VISIT 1 Procedure  PT Treatments  $Gait Training 8-22 mins    Pain: 1/10 Left LE, pt reports premedicated  Zenovia Jarred, PT Pager: [phone]

## 2012-09-21 ENCOUNTER — Encounter (HOSPITAL_COMMUNITY): Payer: Medicare Other

## 2012-09-21 DIAGNOSIS — E871 Hypo-osmolality and hyponatremia: Secondary | ICD-10-CM

## 2012-09-21 LAB — BASIC METABOLIC PANEL
BUN: 19 mg/dL (ref 6–23)
CO2: 31 mEq/L (ref 19–32)
Calcium: 8.7 mg/dL (ref 8.4–10.5)
Chloride: 100 mEq/L (ref 96–112)
Creatinine, Ser: 1.21 mg/dL (ref 0.50–1.35)
GFR calc Af Amer: 69 mL/min — ABNORMAL LOW (ref >90)
GFR calc non Af Amer: 60 mL/min — ABNORMAL LOW (ref >90)
Glucose, Bld: 119 mg/dL — ABNORMAL HIGH (ref 70–99)
Potassium: 4.3 mEq/L (ref 3.5–5.1)
Sodium: 134 mEq/L — ABNORMAL LOW (ref 135–145)

## 2012-09-21 LAB — CBC
HCT: 29.4 % — ABNORMAL LOW (ref 39.0–52.0)
Hemoglobin: 9.9 g/dL — ABNORMAL LOW (ref 13.0–17.0)
MCH: 31.7 pg (ref 26.0–34.0)
MCHC: 33.7 g/dL (ref 30.0–36.0)
MCV: 94.2 fL (ref 78.0–100.0)
Platelets: 196 10*3/uL (ref 150–400)
RBC: 3.12 MIL/uL — ABNORMAL LOW (ref 4.22–5.81)
RDW: 14.6 % (ref 11.5–15.5)
WBC: 3.9 10*3/uL — ABNORMAL LOW (ref 4.0–10.5)

## 2012-09-21 LAB — PROTIME-INR
INR: 1.73 — ABNORMAL HIGH (ref 0.00–1.49)
Prothrombin Time: 19.7 seconds — ABNORMAL HIGH (ref 11.6–15.2)

## 2012-09-21 MED ORDER — ENOXAPARIN SODIUM 40 MG/0.4ML ~~LOC~~ SOLN: 40.0000 mg | SUBCUTANEOUS | Status: DC

## 2012-09-21 MED ORDER — POLYETHYLENE GLYCOL 3350 17 G PO PACK: 17.0000 g | PACK | Freq: Two times a day (BID) | ORAL | Status: DC

## 2012-09-21 MED ORDER — FERROUS SULFATE 325 (65 FE) MG PO TABS: 325.0000 mg | ORAL_TABLET | Freq: Three times a day (TID) | ORAL | Status: DC

## 2012-09-21 MED ORDER — TRAMADOL HCL 50 MG PO TABS: 50.0000 mg | ORAL_TABLET | Freq: Four times a day (QID) | ORAL | Status: DC

## 2012-09-21 MED ORDER — DSS 100 MG PO CAPS: 100.0000 mg | ORAL_CAPSULE | Freq: Two times a day (BID) | ORAL | Status: DC

## 2012-09-21 MED ORDER — WARFARIN SODIUM 2.5 MG PO TABS
2.5000 mg | ORAL_TABLET | Freq: Once | ORAL | Status: DC
  Filled 2012-09-21: qty 1

## 2012-09-21 MED ORDER — DIPHENHYDRAMINE HCL 25 MG PO CAPS: 25.0000 mg | ORAL_CAPSULE | Freq: Four times a day (QID) | ORAL | Status: DC | PRN

## 2012-09-21 MED ORDER — METHOCARBAMOL 500 MG PO TABS: 500.0000 mg | ORAL_TABLET | Freq: Four times a day (QID) | ORAL | Status: DC | PRN

## 2012-09-21 NOTE — Progress Notes (Signed)
ANTICOAGULATION CONSULT NOTE  Pharmacy Consult for Coumadin Indication: Atrial Fibrillation  Allergies  Allergen Reactions  . Morphine And Related Nausea And Vomiting  . Vicodin (Hydrocodone-Acetaminophen) Other (See Comments)    Feel crazy    Patient Measurements: Height: 6\' 3"  (190.5 cm) Weight: 260 lb (117.935 kg) IBW/kg (Calculated) : 84.5   Vital Signs: Temp: 98.4 F (36.9 C) (10/30 0627) Temp src: Oral (10/30 0627) BP: 109/68 mmHg (10/30 0627) Pulse Rate: 60  (10/30 0627)  Labs:  Basename 09/21/12 0421 09/20/12 0447 09/20/12 0420 09/19/12 1430  HGB 9.9* -- 9.7* --  HCT 29.4* -- 29.1* 29.2*  PLT 196 -- 199 209  APTT -- -- -- 33  LABPROT 19.7* 16.9* -- 16.8*  INR 1.73* 1.41 -- 1.40  HEPARINUNFRC -- -- -- --  CREATININE 1.21 -- 1.30 1.30  CKTOTAL -- -- -- --  CKMB -- -- -- --  TROPONINI -- -- -- --    Estimated Creatinine Clearance: 80.9 ml/min (by C-G formula based on Cr of 1.21).   Medical History: Past Medical History  Diagnosis Date  . History of colon polyps   . Hyperlipidemia   . Hypertension   . BPH (benign prostatic hypertrophy)   . Cancer     SKIN CA  . Coronary artery disease     s/p PCI to LAD, to RCA, then extensive PCI to LCx-OM1  (enitre prox-AVG Circ for dissection in 07/2011)  . Paroxysmal atrial fibrillation     followed by Dr. Bryan Lemma  . Rupture quadriceps tendon     Assessment:  66 yom on Coumadin PTA for paroxysmal atrial fibrillation (most recent regimen 2.5mg  on MWF and 5mg  other days of the week, managed by [facility].  Last dose 10/24 in preparation for L quadricept tendon rupture repair 10/28.     Warfarin re-initiation in progress (5mg  given 10/28-10/29).  Drug-drug interaction with home Amiodarone and Effient/ASA noted.  (Has coronary artery stents).   Goal of Therapy:  INR 2-3 Monitor platelets by anticoagulation protocol: Yes   Plan:   Coumadin 2.5 mg po x 1 tonight   Continue  Lovenox 40 mg sq q24h until INR >= 1.8  Daily PT/INR  Loralee Pacas, PharmD, BCPS Pager: [phone] 09/21/2012  9:18 AM

## 2012-09-21 NOTE — Care Management Note (Signed)
    Page 1 of 2   09/21/2012     6:28:07 PM   CARE MANAGEMENT NOTE 09/21/2012  Patient:  Charles Robinson, Charles Robinson   Account Number:  [id number]  Date Initiated:  09/21/2012  Documentation initiated by:  Colleen Can  Subjective/Objective Assessment:   DX RT QUAD TENDON REPAIR     Action/Plan:   CM SPOKE WITH PATIENT. Plans are for patient to return to his home spouse will be caregiver. Her already has DME. Genevieve Norlander will provide [facility] pt services with start date of tomorrow 09/22/2012   Anticipated DC Date:  09/21/2012   Anticipated DC Plan:  HOME W HOME HEALTH SERVICES  In-house referral  NA      DC Planning Services  CM consult      [facility] Choice  HOME HEALTH   Choice offered to / List presented to:  C-1 Patient   DME arranged  NA      DME agency  NA     HH arranged  HH-2 PT      [facility] agency  [facility]   Status of service:  Completed, signed off Medicare Important Message given?  NA - LOS <3 / Initial given by admissions (If response is "NO", the following Medicare IM given date fields will be blank) Date Medicare IM given:   Date Additional Medicare IM given:    Discharge Disposition:  HOME W HOME HEALTH SERVICES  Per UR Regulation:  Reviewed for med. necessity/level of care/duration of stay  If discussed at Long Length of Stay Meetings, dates discussed:    Comments:  09/21/2012 Raynelle Bring BSN CCM Genevieve Norlander will start HHpt services tomorrow 09/22/2012

## 2012-09-21 NOTE — Progress Notes (Signed)
   Subjective: 2 Days Post-Op Procedure(s) (LRB): REPAIR QUADRICEP TENDON (Left)   Patient reports pain as mild, pain well controlled. No events throughout the night  Objective:   VITALS:   Filed Vitals:   09/21/12 0800  BP: 109/68  Pulse: 60  Temp: 98.4 F (36.9 C)   Resp: 16    Neurovascular intact Dorsiflexion/Plantar flexion intact Incision: dressing C/D/I No cellulitis present Compartment soft  LABS  Basename 09/21/12 0421 09/20/12 0420 09/19/12 1430  HGB 9.9* 9.7* 9.9*  HCT 29.4* 29.1* 29.2*  WBC 3.9* 5.2 2.9*  PLT 196 199 209     Basename 09/21/12 0421 09/20/12 0420 09/19/12 1430  NA 134* 138 137  K 4.3 3.7 3.7  BUN 19 20 22   CREATININE 1.21 1.30 1.30  GLUCOSE 119* 102* 123*     Assessment/Plan: 2 Days Post-Op Procedure(s) (LRB): REPAIR QUADRICEP TENDON (Left) Up with therapy Discharge home with home health Follow up in 2 weeks at [facility]. Follow-up Information    Follow up with OLIN,Ashayla Subia D in 2 weeks.   Contact information:   [facility] [address] [place] [address] [phone]         Expected ABLA  Treated with iron and will observe  Obese (BMI 30-39.9) Estimated Body mass index is 32.50 kg/(m^2) as calculated from the following:   Height as of this encounter: 6\' 3" (1.905 m).   Weight as of this encounter: 260 lb(117.935 kg). Patient also counseled that weight may inhibit the healing process Patient counseled that losing weight will help with future health issues  Hyponatremia Will observe     Anastasio Auerbach. Cordella Nyquist   PAC  09/21/2012, 10:07 AM

## 2012-09-21 NOTE — Progress Notes (Signed)
Pt for d/c home today with [facility] PT. Dressing CDI to L LE/knee. Dressing supplies given for home use. D/C instructions & RX given with verbalized understanding. Wife at bedside to assist with d/c.

## 2012-09-21 NOTE — Progress Notes (Signed)
CC notified of need for [facility] to follow-up with Coumadin level at home- will arrange as stated

## 2012-09-23 ENCOUNTER — Encounter (HOSPITAL_COMMUNITY): Payer: Medicare Other

## 2012-09-26 ENCOUNTER — Encounter (HOSPITAL_COMMUNITY): Payer: Medicare Other

## 2012-09-26 NOTE — Discharge Summary (Signed)
Physician Discharge Summary  Patient ID: Charles Robinson MRN: [id number] DOB/AGE: November 05, 1946 66 y.o.  Admit date: 09/19/2012 Discharge date: 09/21/2012   Procedures:  Procedure(s) (LRB): REPAIR QUADRICEP TENDON (Left)  Attending Physician:  Dr. Durene Romans   Admission Diagnoses:   Left quad tendon rupture  Discharge Diagnoses:  Principal Problem:  *S/P left quad tendon repair Active Problems:  Expected blood loss anemia  Obese  Hyponatremia History of colon polyps   Coronary artery disease   Hyperlipidemia   Hypertension   BPH (benign prostatic hypertrophy)   Paroxysmal atrial fibrillation   Cancer - SKIN CA  HPI: Pt is a 66 y.o. male complaining of right knee pain for 10 days. The pain started while he was out he slipped and his left leg ended up underneath his body and he felt a pop. Since that time he has had swelling pain and the inability ro extend his left leg. Pain had continually increased since the beginning. X-rays in the clinic show no acute bony abnormalities. A MRI was obtained which reveled a left quad tendon rupture. He has been in a knee immobilizer/T-ROM brace since being seen in the clinic. Upon return to the clinic after the MRI Dr. Charlann Boxer discussed various options with the patient. Risks, benefits and expectations were discussed with the patient. Patient understand the risks, benefits and expectations and wishes to proceed with surgery.   PCP: Evette Georges, MD   Discharged Condition: good  Hospital Course:  Patient underwent the above stated procedure on 09/19/2012. Patient tolerated the procedure well and brought to the recovery room in good condition and subsequently to the floor.  POD #1 BP: 128/69 ; Pulse: 69 ; Temp: 97.6 F (36.4 C) ; Resp: 16  Pt's foley was removed, as well as the hemovac drain removed. IV was changed to a saline lock. Patient reports pain as mild, pain well controlled at this time. No events throughout the night.    Neurovascular intact, dorsiflexion/plantar flexion intact, incision: dressing C/D/I, no cellulitis present and compartment soft.   LABS  Basename  09/20/12 0420  HGB  9.7  HCT  29.1   POD #2  BP: 109/68 ; Pulse: 60 ; Temp: 98.4 F (36.9 C) ; Resp: 16 Patient reports pain as mild, pain well controlled. No events throughout the night. Ready to be discharged home. Neurovascular intact, dorsiflexion/plantar flexion intact, incision: dressing C/D/I, no cellulitis present and compartment soft.   LABS  Basename  09/21/12 0421  HGB  9.9  HCT  29.4    Discharge Exam: General appearance: alert, cooperative and no distress Extremities: Homans sign is negative, no sign of DVT, no edema, redness or tenderness in the calves or thighs and no ulcers, gangrene or trophic changes  Disposition: Home  with follow up in 2 weeks   Follow-up Information    Follow up with [name], MD. In 2 weeks.   Contact information:   [facility] [address] [place] [address] [phone]           Discharge Medication List as of 09/21/2012 12:24 PM    START taking these medications   Details  diphenhydrAMINE (BENADRYL) 25 mg capsule Take 1 capsule (25 mg total) by mouth every 6 (six) hours as needed for itching, allergies or sleep., Starting 09/21/2012, Until Discontinued, No Print    docusate sodium 100 MG CAPS Take 100 mg by mouth 2 (two) times daily., Starting 09/21/2012, Until Discontinued, No Print  enoxaparin (LOVENOX) 40 MG/0.4ML injection Inject 0.4 mLs (40 mg total) into the skin daily., Starting 09/21/2012, Until Discontinued, Print    ferrous sulfate 325 (65 FE) MG tablet Take 1 tablet (325 mg total) by mouth 3 (three) times daily after meals., Starting 09/21/2012, Until Discontinued, No Print    methocarbamol (ROBAXIN) 500 MG tablet Take 1 tablet (500 mg total) by mouth every 6 (six) hours as needed (muscle spasms)., Starting 09/21/2012, Until  Discontinued, Print    polyethylene glycol (MIRALAX / GLYCOLAX) packet Take 17 g by mouth 2 (two) times daily., Starting 09/21/2012, Until Discontinued, No Print    traMADol (ULTRAM) 50 MG tablet Take 1-2 tablets (50-100 mg total) by mouth every 6 (six) hours., Starting 09/21/2012, Until Discontinued, Print      CONTINUE these medications which have NOT CHANGED   Details  amiodarone (PACERONE) 200 MG tablet Take 200 mg by mouth daily before breakfast. , Until Discontinued, Historical Med    Co-Enzyme Q-10 10 MG CAPS Take 1 capsule by mouth daily.  , Until Discontinued, Historical Med    diltiazem (DILACOR XR) 180 MG 24 hr capsule Take 180 mg by mouth daily before breakfast. , Until Discontinued, Historical Med    doxazosin (CARDURA) 8 MG tablet Take 4 mg by mouth every evening. Takes 1/2 tablet, Until Discontinued, Historical Med    ezetimibe-simvastatin (VYTORIN) 10-20 MG per tablet Take 1 tablet by mouth at bedtime., Until Discontinued, Historical Med    finasteride (PROSCAR) 5 MG tablet Take 5 mg by mouth daily., Until Discontinued, Historical Med    metoprolol succinate (TOPROL XL) 25 MG 24 hr tablet Take 1 tablet (25 mg total) by mouth 2 (two) times daily., Starting 03/16/2012, Until Thu 03/16/13, Print    Multiple Vitamin (MULTIVITAMIN) capsule Take 1 capsule by mouth daily.  , Until Discontinued, Historical Med    niacin (NIASPAN) 1000 MG CR tablet Take 2,000 mg by mouth at bedtime., Until Discontinued, Historical Med    Omega-3 Fatty Acids (FISH OIL) 1000 MG CAPS Take 1,000 capsules by mouth 2 (two) times daily. , Until Discontinued, Historical Med    prasugrel (EFFIENT) 10 MG TABS Take 10 mg by mouth daily before breakfast. , Until Discontinued, Historical Med    aspirin 81 MG tablet Take 81 mg by mouth daily. , Until Discontinued, Historical Med    nitroGLYCERIN (NITROSTAT) 0.4 MG SL tablet Place 0.4 mg under the tongue every 5 (five) minutes as needed. For chest pain, Until  Discontinued, Historical Med    warfarin (COUMADIN) 5 MG tablet Take 2.5-5 mg by mouth daily. Monday,wednesday,and Friday = 2.5 mg    All other days=5mg , Until Discontinued, Historical Med      STOP taking these medications     naproxen sodium (ANAPROX) 220 MG tablet Comments:  Reason for Stopping:           Signed: Anastasio Auerbach. Edan Juday   PAC  09/26/2012, 2:52 PM

## 2012-09-28 ENCOUNTER — Encounter (HOSPITAL_COMMUNITY): Payer: Medicare Other

## 2012-09-30 ENCOUNTER — Encounter (HOSPITAL_COMMUNITY): Payer: Medicare Other

## 2012-10-03 ENCOUNTER — Encounter (HOSPITAL_COMMUNITY): Payer: Medicare Other

## 2012-10-05 ENCOUNTER — Encounter (HOSPITAL_COMMUNITY): Payer: Medicare Other

## 2012-10-06 ENCOUNTER — Ambulatory Visit (HOSPITAL_COMMUNITY): Payer: Medicare Other

## 2012-10-06 ENCOUNTER — Other Ambulatory Visit (HOSPITAL_COMMUNITY): Payer: Self-pay

## 2012-10-06 DIAGNOSIS — I2510 Atherosclerotic heart disease of native coronary artery without angina pectoris: Secondary | ICD-10-CM

## 2012-10-07 ENCOUNTER — Encounter (HOSPITAL_COMMUNITY): Payer: Medicare Other

## 2012-10-10 ENCOUNTER — Encounter (HOSPITAL_COMMUNITY): Payer: Medicare Other

## 2012-10-10 ENCOUNTER — Ambulatory Visit (HOSPITAL_COMMUNITY): Payer: Medicare Other

## 2012-10-12 ENCOUNTER — Encounter (HOSPITAL_COMMUNITY): Payer: Medicare Other

## 2012-10-12 ENCOUNTER — Ambulatory Visit (HOSPITAL_COMMUNITY): Payer: Medicare Other

## 2012-10-14 ENCOUNTER — Ambulatory Visit (HOSPITAL_COMMUNITY): Payer: Medicare Other

## 2012-10-14 ENCOUNTER — Encounter (HOSPITAL_COMMUNITY): Payer: Medicare Other

## 2012-10-17 ENCOUNTER — Encounter (HOSPITAL_COMMUNITY): Payer: Medicare Other

## 2012-10-17 ENCOUNTER — Ambulatory Visit (HOSPITAL_COMMUNITY): Payer: Medicare Other

## 2012-10-19 ENCOUNTER — Encounter (HOSPITAL_COMMUNITY): Payer: Medicare Other

## 2012-10-19 ENCOUNTER — Ambulatory Visit (HOSPITAL_COMMUNITY): Payer: Medicare Other

## 2012-10-21 ENCOUNTER — Encounter (HOSPITAL_COMMUNITY): Payer: Medicare Other

## 2012-10-24 ENCOUNTER — Ambulatory Visit (HOSPITAL_COMMUNITY): Payer: Medicare Other

## 2012-10-24 ENCOUNTER — Encounter (HOSPITAL_COMMUNITY): Payer: Medicare Other

## 2012-10-26 ENCOUNTER — Encounter (HOSPITAL_COMMUNITY): Payer: Medicare Other

## 2012-10-26 ENCOUNTER — Ambulatory Visit (HOSPITAL_COMMUNITY): Payer: Medicare Other

## 2012-10-28 ENCOUNTER — Encounter (HOSPITAL_COMMUNITY): Payer: Medicare Other

## 2012-10-28 ENCOUNTER — Ambulatory Visit (HOSPITAL_COMMUNITY): Payer: Medicare Other

## 2012-10-31 ENCOUNTER — Encounter (HOSPITAL_COMMUNITY): Payer: Medicare Other

## 2012-10-31 ENCOUNTER — Ambulatory Visit (HOSPITAL_COMMUNITY): Payer: Medicare Other

## 2012-11-01 ENCOUNTER — Encounter (HOSPITAL_COMMUNITY): Payer: Medicare Other

## 2012-11-02 ENCOUNTER — Encounter (HOSPITAL_COMMUNITY): Payer: Medicare Other

## 2012-11-02 ENCOUNTER — Ambulatory Visit (HOSPITAL_COMMUNITY): Payer: Medicare Other

## 2012-11-04 ENCOUNTER — Encounter (HOSPITAL_COMMUNITY): Payer: Medicare Other

## 2012-11-04 ENCOUNTER — Ambulatory Visit (HOSPITAL_COMMUNITY): Payer: Medicare Other

## 2012-11-07 ENCOUNTER — Encounter (HOSPITAL_COMMUNITY): Payer: Medicare Other

## 2012-11-07 ENCOUNTER — Ambulatory Visit (HOSPITAL_COMMUNITY): Payer: Medicare Other

## 2012-11-08 ENCOUNTER — Encounter: Payer: Self-pay | Admitting: Pharmacist Clinician (PhC)/ Clinical Pharmacy Specialist

## 2012-11-09 ENCOUNTER — Ambulatory Visit (HOSPITAL_COMMUNITY): Payer: Medicare Other

## 2012-11-09 ENCOUNTER — Encounter (HOSPITAL_COMMUNITY): Payer: Medicare Other

## 2012-11-11 ENCOUNTER — Encounter (HOSPITAL_COMMUNITY): Payer: Medicare Other

## 2012-11-11 ENCOUNTER — Ambulatory Visit (HOSPITAL_COMMUNITY): Payer: Medicare Other

## 2012-11-14 ENCOUNTER — Encounter (HOSPITAL_COMMUNITY): Payer: Medicare Other

## 2012-11-14 ENCOUNTER — Ambulatory Visit (HOSPITAL_COMMUNITY): Payer: Medicare Other

## 2012-11-16 ENCOUNTER — Encounter (HOSPITAL_COMMUNITY): Payer: Medicare Other

## 2012-11-18 ENCOUNTER — Ambulatory Visit (HOSPITAL_COMMUNITY): Payer: Medicare Other

## 2012-11-18 ENCOUNTER — Encounter (HOSPITAL_COMMUNITY): Payer: Medicare Other

## 2012-11-21 ENCOUNTER — Encounter (HOSPITAL_COMMUNITY): Payer: Medicare Other

## 2012-11-21 ENCOUNTER — Ambulatory Visit (HOSPITAL_COMMUNITY): Payer: Medicare Other

## 2012-11-23 HISTORY — PX: NM MYOVIEW LTD: HXRAD82

## 2012-11-25 ENCOUNTER — Ambulatory Visit (HOSPITAL_COMMUNITY): Payer: Medicare Other

## 2012-11-28 ENCOUNTER — Ambulatory Visit (HOSPITAL_COMMUNITY): Payer: Medicare Other

## 2012-11-30 ENCOUNTER — Ambulatory Visit (HOSPITAL_COMMUNITY): Payer: Medicare Other

## 2012-12-02 ENCOUNTER — Ambulatory Visit (HOSPITAL_COMMUNITY): Payer: Medicare Other

## 2012-12-05 ENCOUNTER — Ambulatory Visit (HOSPITAL_COMMUNITY): Payer: Medicare Other

## 2012-12-07 ENCOUNTER — Ambulatory Visit (HOSPITAL_COMMUNITY): Payer: Medicare Other

## 2012-12-09 ENCOUNTER — Ambulatory Visit (HOSPITAL_COMMUNITY): Payer: Medicare Other

## 2012-12-12 ENCOUNTER — Ambulatory Visit (HOSPITAL_COMMUNITY): Payer: Medicare Other

## 2012-12-14 ENCOUNTER — Ambulatory Visit (HOSPITAL_COMMUNITY): Payer: Medicare Other

## 2012-12-16 ENCOUNTER — Other Ambulatory Visit (HOSPITAL_COMMUNITY): Payer: Self-pay | Admitting: Cardiology

## 2012-12-16 ENCOUNTER — Ambulatory Visit (HOSPITAL_COMMUNITY): Payer: Medicare Other

## 2012-12-16 DIAGNOSIS — I2510 Atherosclerotic heart disease of native coronary artery without angina pectoris: Secondary | ICD-10-CM

## 2012-12-19 ENCOUNTER — Ambulatory Visit (HOSPITAL_COMMUNITY): Payer: Medicare Other

## 2012-12-20 ENCOUNTER — Ambulatory Visit (HOSPITAL_COMMUNITY)
Admission: RE | Admit: 2012-12-20 | Discharge: 2012-12-20 | Disposition: A | Discharge status: Home / Self Care | Payer: Medicare Other | Source: Ambulatory Visit | Attending: Cardiology | Admitting: Cardiology

## 2012-12-20 DIAGNOSIS — I2510 Atherosclerotic heart disease of native coronary artery without angina pectoris: Secondary | ICD-10-CM | POA: Insufficient documentation

## 2012-12-20 DIAGNOSIS — I10 Essential (primary) hypertension: Secondary | ICD-10-CM | POA: Insufficient documentation

## 2012-12-20 DIAGNOSIS — I480 Paroxysmal atrial fibrillation: Secondary | ICD-10-CM

## 2012-12-20 HISTORY — DX: Paroxysmal atrial fibrillation: I48.0

## 2012-12-20 MED ORDER — TECHNETIUM TC 99M SESTAMIBI GENERIC - CARDIOLITE
8.5000 | Freq: Once | INTRAVENOUS | Status: AC | PRN
  Administered 2012-12-20: 9 via INTRAVENOUS

## 2012-12-20 MED ORDER — TECHNETIUM TC 99M SESTAMIBI GENERIC - CARDIOLITE
25.9000 | Freq: Once | INTRAVENOUS | Status: AC | PRN
  Administered 2012-12-20: 25.9 via INTRAVENOUS

## 2012-12-20 MED ORDER — REGADENOSON 0.4 MG/5ML IV SOLN
0.4000 mg | Freq: Once | INTRAVENOUS | Status: AC
  Administered 2012-12-20: 0.4 mg via INTRAVENOUS

## 2012-12-20 NOTE — Procedures (Addendum)
[facility] [address] [place] [address] [phone]  Cardiology Nuclear Med Study  Charles Robinson is a 67 y.o. male     MRN : [id number]     DOB: [birth year]  Procedure Date: 12/20/2012  Nuclear Med Background Indication for Stress Test:  Evaluation for Ischemia History:  CAD, Prior Stent placement, PAF Cardiac Risk Factors: Family History - CAD, Hypertension, Lipids and Overweight  Symptoms:  none   Nuclear Pre-Procedure Caffeine/Decaff Intake:  10:00pm NPO After: 8:00am   IV Site: R Antecubital  IV 0.9% NS with Angio Cath:  22g  Chest Size (in):  50 IV Started by: Koren Shiver, CNMT  Height: 6\' 3"  (1.905 m)  Cup Size: n/a  BMI:  Body mass index is 32.75 kg/(m^2). Weight:  262 lb (118.842 kg)   Tech Comments:  n/a    Nuclear Med Study 1 or 2 day study: 1 day  Stress Test Type:  Lexiscan  Order Authorizing Provider:  Bryan Lemma, MD   Resting Radionuclide: Technetium 43m Sestamibi  Resting Radionuclide Dose: 8.5 mCi   Stress Radionuclide:  Technetium 27m Sestamibi  Stress Radionuclide Dose: 25.9 mCi           Stress Protocol Rest HR: 54 Stress HR: 71  Rest BP: 140/90 Stress BP: 163/87  Exercise Time (min): n/a METS: n/a   Predicted Max HR: 152 bpm % Max HR: 46.71 bpm Rate Pressure Product: 21308   Dose of Adenosine (mg):  n/a Dose of Lexiscan: 0.4 mg  Dose of Atropine (mg): n/a Dose of Dobutamine: n/a mcg/kg/min (at max HR)  Stress Test Technologist: Esperanza Sheets, CCT Nuclear Technologist: Gonzella Lex, CNMT   Rest Procedure:  Myocardial perfusion imaging was performed at rest 45 minutes following the intravenous administration of Technetium 38m Sestamibi. Stress Procedure:  The patient received IV Lexiscan 0.4 mg over 15-seconds.  Technetium 38m Sestamibi injected at 30-seconds.  There were no significant changes with Lexiscan.  Quantitative spect images were obtained after a 45 minute  delay.  Transient Ischemic Dilatation (Normal <1.22):  1.01 Lung/Heart Ratio (Normal <0.45):  0.31 QGS EDV:  133 ml QGS ESV:  52 ml LV Ejection Fraction: 61%  Signed by    Rest ECG: NSR - Normal EKG  Stress ECG: No significant change from baseline ECG  QPS Raw Data Images:  Normal; no motion artifact; normal heart/lung ratio. Stress Images:  Normal homogeneous uptake in all areas of the myocardium. Rest Images:  Normal homogeneous uptake in all areas of the myocardium. Subtraction (SDS):  No evidence of ischemia.  Impression Exercise Capacity:  Lexiscan with no exercise. BP Response:  Normal blood pressure response. Clinical Symptoms:  There is dyspnea. ECG Impression:  No significant ST segment change suggestive of ischemia. Comparison with Prior Nuclear Study: There was marked improvement of perfusion to the inferolateral wll compared to the prior study  Overall Impression:  Low risk stress nuclear study.  LV Wall Motion:  NL LV Function; NL Wall Motion   Runell Gess, MD  12/20/2012 12:46 PM

## 2012-12-21 ENCOUNTER — Ambulatory Visit (HOSPITAL_COMMUNITY): Payer: Medicare Other

## 2012-12-23 ENCOUNTER — Ambulatory Visit (HOSPITAL_COMMUNITY): Payer: Medicare Other

## 2012-12-26 ENCOUNTER — Ambulatory Visit (HOSPITAL_COMMUNITY): Payer: Medicare Other

## 2012-12-28 ENCOUNTER — Ambulatory Visit (HOSPITAL_COMMUNITY): Payer: Medicare Other

## 2012-12-30 ENCOUNTER — Other Ambulatory Visit: Payer: Self-pay | Admitting: Dermatology

## 2012-12-30 ENCOUNTER — Ambulatory Visit (HOSPITAL_COMMUNITY): Payer: Medicare Other

## 2013-01-02 ENCOUNTER — Ambulatory Visit (HOSPITAL_COMMUNITY): Payer: Medicare Other

## 2013-01-04 ENCOUNTER — Ambulatory Visit (HOSPITAL_COMMUNITY): Payer: Medicare Other

## 2013-01-06 ENCOUNTER — Ambulatory Visit (HOSPITAL_COMMUNITY): Payer: Medicare Other

## 2013-01-09 ENCOUNTER — Ambulatory Visit (HOSPITAL_COMMUNITY): Payer: Medicare Other

## 2013-01-11 ENCOUNTER — Ambulatory Visit (HOSPITAL_COMMUNITY): Payer: Medicare Other

## 2013-01-13 ENCOUNTER — Ambulatory Visit (HOSPITAL_COMMUNITY): Payer: Medicare Other

## 2013-01-24 ENCOUNTER — Other Ambulatory Visit: Payer: Self-pay | Admitting: Family Medicine

## 2013-03-20 ENCOUNTER — Encounter: Payer: Self-pay | Admitting: Pharmacist Clinician (PhC)/ Clinical Pharmacy Specialist

## 2013-03-23 ENCOUNTER — Encounter: Payer: Self-pay | Admitting: Cardiology

## 2013-04-10 ENCOUNTER — Telehealth: Payer: Self-pay | Admitting: Cardiology

## 2013-04-10 NOTE — Telephone Encounter (Signed)
LMOM for patient, will try again later this afternoon

## 2013-04-10 NOTE — Telephone Encounter (Signed)
The patient states that he needs to speak with Baxter Hire about his Coumadin and Crestor.

## 2013-04-10 NOTE — Telephone Encounter (Signed)
Message forwarded to K. Alvstad, PharmD and chart placed in her inbox.

## 2013-04-11 NOTE — Telephone Encounter (Signed)
Noted increased bruising recently, was concerned that Crestor might be to blame.  Explained that we have rarely seen INR levels increase with warfarin, but we can check INR sooner than scheduled if need be.  Pt states he will call for earlier appt if bruising increases.

## 2013-04-13 ENCOUNTER — Telehealth: Payer: Self-pay | Admitting: Pharmacist Clinician (PhC)/ Clinical Pharmacy Specialist

## 2013-04-13 MED ORDER — ROSUVASTATIN CALCIUM 20 MG PO TABS: 20.0000 mg | ORAL_TABLET | Freq: Every day | ORAL | Status: DC

## 2013-04-13 NOTE — Telephone Encounter (Signed)
Pt had been given samples of Crestor 20mg , now needs Rx called to [facility]

## 2013-04-18 ENCOUNTER — Ambulatory Visit: Payer: Medicare Other | Admitting: Pharmacist Clinician (PhC)/ Clinical Pharmacy Specialist

## 2013-04-18 ENCOUNTER — Ambulatory Visit: Payer: Medicare Other | Admitting: Cardiology

## 2013-04-20 ENCOUNTER — Other Ambulatory Visit: Payer: Self-pay | Admitting: Cardiology

## 2013-04-20 ENCOUNTER — Encounter: Payer: Self-pay | Admitting: Cardiology

## 2013-04-21 ENCOUNTER — Ambulatory Visit: Payer: Medicare Other | Admitting: Cardiology

## 2013-04-21 ENCOUNTER — Ambulatory Visit (INDEPENDENT_AMBULATORY_CARE_PROVIDER_SITE_OTHER): Payer: Medicare Other | Admitting: Pharmacist Clinician (PhC)/ Clinical Pharmacy Specialist

## 2013-04-21 ENCOUNTER — Ambulatory Visit (INDEPENDENT_AMBULATORY_CARE_PROVIDER_SITE_OTHER): Payer: Medicare Other | Admitting: Cardiology

## 2013-04-21 ENCOUNTER — Encounter: Payer: Self-pay | Admitting: Cardiology

## 2013-04-21 ENCOUNTER — Other Ambulatory Visit: Payer: Self-pay | Admitting: Pharmacist Clinician (PhC)/ Clinical Pharmacy Specialist

## 2013-04-21 VITALS — BP 130/88 | HR 59 | Ht 75.0 in | Wt 246.0 lb

## 2013-04-21 DIAGNOSIS — Z7901 Long term (current) use of anticoagulants: Secondary | ICD-10-CM | POA: Insufficient documentation

## 2013-04-21 DIAGNOSIS — I4891 Unspecified atrial fibrillation: Secondary | ICD-10-CM

## 2013-04-21 DIAGNOSIS — I10 Essential (primary) hypertension: Secondary | ICD-10-CM

## 2013-04-21 DIAGNOSIS — E785 Hyperlipidemia, unspecified: Secondary | ICD-10-CM

## 2013-04-21 DIAGNOSIS — I2510 Atherosclerotic heart disease of native coronary artery without angina pectoris: Secondary | ICD-10-CM

## 2013-04-21 DIAGNOSIS — E669 Obesity, unspecified: Secondary | ICD-10-CM

## 2013-04-21 LAB — POCT INR: INR: 2

## 2013-04-21 MED ORDER — CLOPIDOGREL BISULFATE 75 MG PO TABS: 75.0000 mg | ORAL_TABLET | Freq: Every day | ORAL | Status: DC

## 2013-04-21 NOTE — Patient Instructions (Addendum)
Your physician has recommended you make the following change in your medication stop effient start plavix 75 mg daily.   Your physician recommends that you schedule a follow-up appointment in12 months and see Extender in 6 months

## 2013-04-21 NOTE — Progress Notes (Signed)
Patient ID: Charles Robinson, male   DOB: [birth year], 67 y.o.   MRN: [id number]  Clinic Note:  HPI: Charles Robinson is a 67 y.o. male with a complicated cardiac PMH below who presents today for routine followup.  "[name] " is a long-term patient of Dr. 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 I was able to treat the OM branch and the circumflex with several stents with excellent result. He then came back in April 2013 with essentially similar ischemia on a stress test that was seen prior to his PCI 2012 and the previous PCI would be a mass in that location earlier in 2012. On that occasion he was found to have some in-stent restenosis and some segmental stenosis between 2 stents that had not been overlapped. This area as well as a more proximal area of the OM branch with excellent success. He has had a followup Myoview stress test in January of this year, and is very happy to show there was no evidence of ischemia or infarction, with complete resolution of the pre-PCI ischemic region. I was very happy she has no infarction in this area as well since he did have small type 4a MI post PCI. He also had atrial fibrillation during this occasion which was cardioverted and treated chemically with amiodarone. He's been on amiodarone and warfarin since.  Interval History: He comes in today feeling wonderful, with no complaints. He denies any chest pain or shortness of breath with rest or exertion. He denies any PND, orthopnea, or edema. He does have some significant bruising on history of medications but denies any significant episodes of bleeding either melena, hematochezia, or hematuria, nosebleeds. He denies any syncope/near syncope symptoms or lightheadedness/weakness -- with the septum occasionally having some mild positional dizziness early in the morning  after initially waking up.. No TIA or amaurosis fugax symptoms. No claudication symptoms. He denies any significant episodes of palpitation that he recognizes his atrial fibrillation. His been extremely active and watching his diet. His activity has been somewhat limited however by poor and tended in his leg last year. Now his mostly been active moving from one house to another house this further downstream. Carrying boxes down the street and then the car with no symptoms.  Past Medical History  Diagnosis Date  . History of colon polyps   . Hyperlipidemia   . Hypertension   . BPH (benign prostatic hypertrophy)   . Cancer     SKIN CA  . Paroxysmal atrial fibrillation     followed by Dr. Bryan Lemma; s/p DCCV 2011, 07/2011 post PCI with Type 4a MI  . Rupture quadriceps tendon   . Coronary artery disease 03/15/2012    s/p PCI to LAD, to RCA, then extensive PCI to LCx-OM1  (enitre prox-AVG Circ for dissection in 07/2011)  . Chest pain 08/16/2011    Echo - EF 60-65%; moderate LV concentric hypertrophy; abdnormal LV relaxation (grade 1 diastolic dysfunction; ascending aorta mildly dilated; LA moderately dilated;   . Atrial flutter 08/20/2011    TEE- atrial septum - no defect identified; RA normal in size, no evidence of thrombus; ascending aorta normal  . PAF (paroxysmal atrial fibrillation) 12/20/2012    R/L CL - EF 61%; normal EKG, no motion artifact, no evidence of ischemia, normal BP response, low risk stress nuclear study  .  Bruit 08/01/2009    doppler - no suggestion of significant diameter reduction, dissection or aneurysmal dilation.  normal evaluation   Prior cardiac evaluation and past surgical history: Past Surgical History  Procedure Laterality Date  . Coronary stent placement  2003 - 2011 - 2012 - 2013    x 8 stents (per pt)  . Coronary angioplasty with stent placement  03/15/2012    multiple stens in AVGroove Circ & OM1  . Back surgery    . Cardiac catheterization  03/15/2012     patent LAD stents, patent LCx stents w/80% focal ISR just distal to OM; OM proximal stent open; 2 site PTCA w/ PTCA and stenting of OM2 ang PTCA of distal stent ISR followed by PTCA of mid LCx ISR  . Quadriceps tendon repair  09/19/2012    Procedure: REPAIR QUADRICEP TENDON;  Surgeon: [name], MD;  Location: [facility];  Service: Orthopedics;  Laterality: Left;  . Nm myoview ltd  11/2012    No ischemia or infarction, normal EF & WM  . Doppler echocardiography  07/2011    EF ~60-65%, Grade 1 D Dysfxn; mod Conc LVH    Allergies  Allergen Reactions  . Lipitor (Atorvastatin) Other (See Comments)    Increased HR is currently tolerating --   . Morphine And Related Nausea And Vomiting  . Vicodin (Hydrocodone-Acetaminophen) Other (See Comments)    Feel crazy   Current Outpatient Prescriptions  Medication Sig Dispense Refill  . amiodarone (PACERONE) 200 MG tablet Take 200 mg by mouth daily before breakfast.       . aspirin 81 MG tablet Take 81 mg by mouth daily.       . Coenzyme Q10 200 MG capsule Take 200 mg by mouth daily.      [name] diltiazem (DILACOR XR) 180 MG 24 hr capsule Take 180 mg by mouth daily before breakfast.       . doxazosin (CARDURA) 8 MG tablet Take 4 mg by mouth every evening. Takes 1/2 tablet      . finasteride (PROSCAR) 5 MG tablet Take 5 mg by mouth daily.      . metoprolol succinate (TOPROL-XL) 25 MG 24 hr tablet Take 25 mg by mouth 2 (two) times daily.      . Multiple Vitamin (MULTIVITAMIN) capsule Take 1 capsule by mouth daily.        . niacin (NIASPAN) 1000 MG CR tablet TAKE 2 TABLETS BY MOUTH EVERY NIGHT AT BEDTIME  60 tablet  0  . nitroGLYCERIN (NITROSTAT) 0.4 MG SL tablet Place 0.4 mg under the tongue every 5 (five) minutes as needed. For chest pain      . Omega-3 Fatty Acids (FISH OIL) 1000 MG CAPS Take 1,000 capsules by mouth 2 (two) times daily.       . rosuvastatin (CRESTOR) 20 MG tablet Take 1 tablet (20 mg total) by mouth daily.  90 tablet  3  . warfarin  (COUMADIN) 5 MG tablet Take 2.5-5 mg by mouth daily. Monday,wednesday,and Friday = 2.5 mg    All other days=5mg       . clopidogrel (PLAVIX) 75 MG tablet Take 1 tablet (75 mg total) by mouth daily.  30 tablet  11   No current facility-administered medications for this visit.   At the time of his visit he was on Prasugrel 10 mg PO and not Plavix 75 mg.  History   Social History  . Marital Status: Married    Spouse Name: N/A  Number of Children: N/A  . Years of Education: N/A   Occupational History  . Not on file.   Social History Main Topics  . Smoking status: Never Smoker   . Smokeless tobacco: Never Used  . Alcohol Use: 1.2 oz/week    2 Cans of beer per week     Comment: rare  . Drug Use: No  . Sexually Active: Not Currently   Other Topics Concern  . Not on file   Social History Narrative   [name] is a father of 2, grandfather of 4.  His daughter was classmates with Dr. Herbie Baltimore in college.   He and his wife are currently in the process of moving houses, further down the street in order to downsize to a single-story house.  He has been working out at [employer].         ROS: A comprehensive Review of Systems - Negative except Some pain where he had a torn tendon in his leg, 4 times a night nocturia. No cramps or aching from a statin. Otherwise pertinent positives as noted above.  PHYSICAL EXAM BP 130/88  Pulse 59  Ht 6\' 3"  (1.905 m)  Wt 246 lb (111.585 kg)  BMI 30.75 kg/m2 General appearance: alert, cooperative, appears stated age, no distress, mildly obese and Very pleasant mood and affect. His weight is down 15 pounds from his last visit weight of 261 pounds with a BMI of 30.75 today Neck: no adenopathy, no carotid bruit, no JVD, supple, symmetrical, trachea midline and thyroid not enlarged, symmetric, no tenderness/mass/nodules Lungs: clear to auscultation bilaterally, normal percussion bilaterally and Nonlabored. Normal expansion Heart: regular rate and rhythm, S1, S2  normal, no murmur, click, rub or gallop and normal apical impulse Abdomen: soft, non-tender; bowel sounds normal; no masses,  no organomegaly and Mildly obese Extremities: extremities normal, atraumatic, no cyanosis or edema and Multiple small areas of bruising along the upper arms. Pulses: 2+ and symmetric Neurologic: Alert and oriented X 3, normal strength and tone. Normal symmetric reflexes. Normal coordination and gait HEENT: [place]/AP, EOMI, MMM, anicteric sclera  [id number] today: Yes Rate: 57 , Rhythm: Sinus bradycardia;  Conduction Delay first degree AV block; ST-T abnormalities: None, otherwise normal.  ASSESSMENT: Very stable from a cardiac standpoint. No angina, and no heart failure, or no atrial fibrillation since last visit.  Atrial fibrillation - Plan: EKG 12-Lead  CAD (coronary artery disease) - Plan: EKG 12-Lead  Coronary artery disease  HYPERLIPIDEMIA  HYPERTENSION  Obese  PLAN: Per problem list.  Meds ordered this encounter  Medications  . Coenzyme Q10 200 MG capsule    Sig: Take 200 mg by mouth daily.  . metoprolol succinate (TOPROL-XL) 25 MG 24 hr tablet    Sig: Take 25 mg by mouth 2 (two) times daily.  . clopidogrel (PLAVIX) 75 MG tablet    Sig: Take 1 tablet (75 mg total) by mouth daily.    Dispense:  30 tablet    Refill:  11  Plavix is new  Followup: PA in 6 months, M.D. in 12 months  HARDING,DAVID W, M.D., M.S. [facility] [address] [place], [place]  [address]  [phone] Pager # [phone] 04/22/2013 9:25 PM

## 2013-04-22 ENCOUNTER — Encounter: Payer: Self-pay | Admitting: Cardiology

## 2013-04-22 NOTE — Assessment & Plan Note (Addendum)
He recently was switched from Vytorin to accommodation Lipitor/Zetia however this is no longer in production -- therefore he was switched recently to Crestor, which he seems to be tolerating well any major complications. He has listed allergies to Crestor and Lipitor which I think are incorrect. I will take these off his list after this visit.  His labs are being followed by his primary physician. his most recent labs I could see were from October 2013 with an LDL in the 80s which is pretty close to goal. I look forward to see how well they're being controlled with Crestor.

## 2013-04-22 NOTE — Assessment & Plan Note (Signed)
His blood pressure is well-controlled with accommodation of diltiazem, and metoprolol. He is not currently on an ACE inhibitor or ARB, simply because the diltiazem is being used for additional rate control.

## 2013-04-22 NOTE — Assessment & Plan Note (Addendum)
He has not had anymore episodes to suggest atrial fibrillation since his hospitalization following the complicated PCI in September 2012. He remains on amiodarone and on warfarin. I think during his next visit if he continues to be stable, I would be in favor of stopping the amiodarone as opposed to get long-term. I would then want to see how he does over the next 6 months to see if he has any recurrence of A. fib. At that time, when I see him in a year from now, we can discuss whether we continue  warfarin or other NOAC therapy if from there. I am a little reluctant to discontinue anticoagulation, but I do think that a six-month followup we can probably stop the aspirin just use Plavix plus warfarin.

## 2013-04-22 NOTE — Assessment & Plan Note (Signed)
He seemed to do very well with this having lost quite a bit of weight. I congratulated him on his efforts and recommended continued to work on his diet and exercise.

## 2013-04-22 NOTE — Assessment & Plan Note (Signed)
We were both very very happy to see the results of his repeat Myoview stress test, showing no evidence of any ischemia or infarction. He is doing well from a cardiac standpoint with no cardiac symptoms of angina with rest exertion. He has no heart failure symptoms, and is doing relatively well with anticoagulation regimen. I do think his been long enough now to convert him from Effient to Plavix once he completes his current bottle of Effient. Simply based on the extent of ischemia and he had, and his lack of overall symptoms, I think a would like to do stress tests perhaps every other year to ensure there is no issues with his extensive stenting.

## 2013-04-25 ENCOUNTER — Other Ambulatory Visit: Payer: Self-pay | Admitting: Pharmacist Clinician (PhC)/ Clinical Pharmacy Specialist

## 2013-04-25 MED ORDER — WARFARIN SODIUM 5 MG PO TABS: ORAL_TABLET | ORAL | Status: DC

## 2013-04-25 NOTE — Telephone Encounter (Signed)
Need refill 

## 2013-05-23 ENCOUNTER — Ambulatory Visit (INDEPENDENT_AMBULATORY_CARE_PROVIDER_SITE_OTHER): Payer: Medicare Other | Admitting: Pharmacist Clinician (PhC)/ Clinical Pharmacy Specialist

## 2013-05-23 VITALS — BP 124/80 | HR 64

## 2013-05-23 DIAGNOSIS — Z7901 Long term (current) use of anticoagulants: Secondary | ICD-10-CM

## 2013-05-23 DIAGNOSIS — I4891 Unspecified atrial fibrillation: Secondary | ICD-10-CM

## 2013-05-23 LAB — POCT INR: INR: 1.9

## 2013-06-12 ENCOUNTER — Ambulatory Visit (INDEPENDENT_AMBULATORY_CARE_PROVIDER_SITE_OTHER): Payer: Medicare Other | Admitting: Pharmacist Clinician (PhC)/ Clinical Pharmacy Specialist

## 2013-06-12 VITALS — BP 128/72 | HR 56

## 2013-06-12 DIAGNOSIS — Z7901 Long term (current) use of anticoagulants: Secondary | ICD-10-CM

## 2013-06-12 DIAGNOSIS — I4891 Unspecified atrial fibrillation: Secondary | ICD-10-CM

## 2013-06-12 LAB — POCT INR: INR: 1.6

## 2013-06-13 ENCOUNTER — Ambulatory Visit: Payer: Medicare Other | Admitting: Pharmacist Clinician (PhC)/ Clinical Pharmacy Specialist

## 2013-07-02 ENCOUNTER — Other Ambulatory Visit: Payer: Self-pay | Admitting: Cardiology

## 2013-07-03 NOTE — Telephone Encounter (Signed)
Rx was sent to pharmacy electronically. 

## 2013-07-04 ENCOUNTER — Ambulatory Visit (INDEPENDENT_AMBULATORY_CARE_PROVIDER_SITE_OTHER): Payer: Medicare Other | Admitting: Pharmacist Clinician (PhC)/ Clinical Pharmacy Specialist

## 2013-07-04 VITALS — BP 134/72 | HR 78

## 2013-07-04 DIAGNOSIS — I4891 Unspecified atrial fibrillation: Secondary | ICD-10-CM

## 2013-07-04 DIAGNOSIS — Z7901 Long term (current) use of anticoagulants: Secondary | ICD-10-CM

## 2013-07-04 LAB — POCT INR: INR: 2.4

## 2013-07-13 ENCOUNTER — Telehealth: Payer: Self-pay | Admitting: Pharmacist Clinician (PhC)/ Clinical Pharmacy Specialist

## 2013-07-13 DIAGNOSIS — Z79899 Other long term (current) drug therapy: Secondary | ICD-10-CM

## 2013-07-17 ENCOUNTER — Telehealth: Payer: Self-pay | Admitting: Cardiology

## 2013-07-17 NOTE — Telephone Encounter (Signed)
Would like for you to mail the papers for the blood test to him   Thanks

## 2013-07-21 ENCOUNTER — Telehealth (HOSPITAL_COMMUNITY): Payer: Self-pay | Admitting: Pharmacist Clinician (PhC)/ Clinical Pharmacy Specialist

## 2013-07-21 NOTE — Telephone Encounter (Signed)
Charles Robinson has questions about the blood test he is to have at [facility]. Something to do with Plavix.  Please call him.

## 2013-07-25 NOTE — Telephone Encounter (Signed)
Wasn't sure which lab to go to for 2C19 test.  Pt knows now to go to [facility] lab for blood draw.

## 2013-08-01 ENCOUNTER — Ambulatory Visit (INDEPENDENT_AMBULATORY_CARE_PROVIDER_SITE_OTHER): Payer: Medicare Other | Admitting: Pharmacist Clinician (PhC)/ Clinical Pharmacy Specialist

## 2013-08-01 ENCOUNTER — Telehealth: Payer: Self-pay | Admitting: Pharmacist Clinician (PhC)/ Clinical Pharmacy Specialist

## 2013-08-01 ENCOUNTER — Telehealth: Payer: Self-pay | Admitting: *Deleted

## 2013-08-01 VITALS — BP 132/78 | HR 52

## 2013-08-01 DIAGNOSIS — E785 Hyperlipidemia, unspecified: Secondary | ICD-10-CM

## 2013-08-01 DIAGNOSIS — I4891 Unspecified atrial fibrillation: Secondary | ICD-10-CM

## 2013-08-01 DIAGNOSIS — Z7901 Long term (current) use of anticoagulants: Secondary | ICD-10-CM

## 2013-08-01 LAB — HEPATIC FUNCTION PANEL
ALT: 21 U/L (ref 0–53)
AST: 26 U/L (ref 0–37)
Albumin: 3.8 g/dL (ref 3.5–5.2)
Alkaline Phosphatase: 74 U/L (ref 39–117)
Bilirubin, Direct: 0.2 mg/dL (ref 0.0–0.3)
Indirect Bilirubin: 0.7 mg/dL (ref 0.0–0.9)
Total Bilirubin: 0.9 mg/dL (ref 0.3–1.2)
Total Protein: 6.8 g/dL (ref 6.0–8.3)

## 2013-08-01 LAB — LIPID PANEL
Cholesterol: 146 mg/dL (ref 0–200)
HDL: 60 mg/dL (ref >39)
LDL Cholesterol: 77 mg/dL (ref 0–99)
Total CHOL/HDL Ratio: 2.4 Ratio
Triglycerides: 44 mg/dL (ref <150)
VLDL: 9 mg/dL (ref 0–40)

## 2013-08-01 LAB — POCT INR: INR: 2.1

## 2013-08-01 LAB — PLATELET INHIBITION P2Y12: Platelet Function  P2Y12: 85 [PRU] — ABNORMAL LOW (ref 194–418)

## 2013-08-01 NOTE — Telephone Encounter (Signed)
Message copied by Tobin Chad on Tue Aug 01, 2013  1:50 PM ------      Message from: [facility], DAVID      Created: Tue Aug 01, 2013 12:16 PM       Plavix is definitely working -- can d/c ASA & use just Plavix + Warfarin            Marykay Lex, MD       ------

## 2013-08-01 NOTE — Telephone Encounter (Signed)
Results given. Verbalized understanding. Pt request copy to be sent to him. Mailed done

## 2013-08-02 ENCOUNTER — Telehealth: Payer: Self-pay | Admitting: *Deleted

## 2013-08-02 NOTE — Telephone Encounter (Signed)
Message copied by Tobin Chad on Wed Aug 02, 2013 11:19 AM ------      Message from: [facility], DAVID      Created: Tue Aug 01, 2013  5:21 PM       Looks great!!            Marykay Lex, MD       ------

## 2013-08-02 NOTE — Telephone Encounter (Signed)
Spoke patient. Result given.request labs to be mailed.  done

## 2013-08-03 NOTE — Telephone Encounter (Signed)
Advised pt to get lab for platelet inhibition at [facility] hospital lab.  Pt voiced understanding

## 2013-08-17 ENCOUNTER — Other Ambulatory Visit: Payer: Self-pay | Admitting: *Deleted

## 2013-08-17 MED ORDER — ROSUVASTATIN CALCIUM 20 MG PO TABS: 20.0000 mg | ORAL_TABLET | Freq: Every day | ORAL | Status: DC

## 2013-08-17 MED ORDER — METOPROLOL SUCCINATE ER 25 MG PO TB24: 25.0000 mg | ORAL_TABLET | Freq: Two times a day (BID) | ORAL | Status: DC

## 2013-08-17 MED ORDER — CLOPIDOGREL BISULFATE 75 MG PO TABS: ORAL_TABLET | ORAL | Status: DC

## 2013-08-17 MED ORDER — DILTIAZEM HCL ER 180 MG PO CP24: 180.0000 mg | ORAL_CAPSULE | Freq: Every day | ORAL | Status: DC

## 2013-08-17 MED ORDER — AMIODARONE HCL 200 MG PO TABS: ORAL_TABLET | ORAL | Status: DC

## 2013-08-17 NOTE — Telephone Encounter (Signed)
Rx was sent to pharmacy electronically. 

## 2013-08-24 ENCOUNTER — Ambulatory Visit (INDEPENDENT_AMBULATORY_CARE_PROVIDER_SITE_OTHER): Payer: Medicare Other | Admitting: Pharmacist Clinician (PhC)/ Clinical Pharmacy Specialist

## 2013-08-24 VITALS — BP 130/80 | HR 56

## 2013-08-24 DIAGNOSIS — I4891 Unspecified atrial fibrillation: Secondary | ICD-10-CM

## 2013-08-24 DIAGNOSIS — Z7901 Long term (current) use of anticoagulants: Secondary | ICD-10-CM

## 2013-08-24 LAB — POCT INR: INR: 2.4

## 2013-08-25 ENCOUNTER — Ambulatory Visit: Payer: Medicare Other | Admitting: Pharmacist Clinician (PhC)/ Clinical Pharmacy Specialist

## 2013-09-25 ENCOUNTER — Ambulatory Visit (INDEPENDENT_AMBULATORY_CARE_PROVIDER_SITE_OTHER): Payer: Medicare Other | Admitting: Pharmacist Clinician (PhC)/ Clinical Pharmacy Specialist

## 2013-09-25 VITALS — BP 150/82 | HR 52

## 2013-09-25 DIAGNOSIS — Z7901 Long term (current) use of anticoagulants: Secondary | ICD-10-CM

## 2013-09-25 DIAGNOSIS — I4891 Unspecified atrial fibrillation: Secondary | ICD-10-CM

## 2013-09-25 LAB — POCT INR: INR: 2

## 2013-10-26 ENCOUNTER — Ambulatory Visit: Payer: Medicare Other | Admitting: Pharmacist Clinician (PhC)/ Clinical Pharmacy Specialist

## 2013-10-26 ENCOUNTER — Ambulatory Visit: Payer: Medicare Other | Admitting: Cardiology

## 2013-11-02 ENCOUNTER — Ambulatory Visit: Payer: Medicare Other | Admitting: Cardiology

## 2013-11-08 ENCOUNTER — Ambulatory Visit (INDEPENDENT_AMBULATORY_CARE_PROVIDER_SITE_OTHER): Payer: Medicare Other | Admitting: Cardiology

## 2013-11-08 ENCOUNTER — Ambulatory Visit (INDEPENDENT_AMBULATORY_CARE_PROVIDER_SITE_OTHER): Payer: Medicare Other | Admitting: Pharmacist Clinician (PhC)/ Clinical Pharmacy Specialist

## 2013-11-08 ENCOUNTER — Encounter: Payer: Self-pay | Admitting: Cardiology

## 2013-11-08 VITALS — BP 140/80 | HR 58 | Ht 75.0 in | Wt 261.0 lb

## 2013-11-08 DIAGNOSIS — I2510 Atherosclerotic heart disease of native coronary artery without angina pectoris: Secondary | ICD-10-CM

## 2013-11-08 DIAGNOSIS — Z7901 Long term (current) use of anticoagulants: Secondary | ICD-10-CM

## 2013-11-08 DIAGNOSIS — E785 Hyperlipidemia, unspecified: Secondary | ICD-10-CM

## 2013-11-08 DIAGNOSIS — I4891 Unspecified atrial fibrillation: Secondary | ICD-10-CM

## 2013-11-08 DIAGNOSIS — I480 Paroxysmal atrial fibrillation: Secondary | ICD-10-CM

## 2013-11-08 DIAGNOSIS — I10 Essential (primary) hypertension: Secondary | ICD-10-CM

## 2013-11-08 LAB — POCT INR: INR: 2.3

## 2013-11-08 NOTE — Assessment & Plan Note (Signed)
Controlled.  

## 2013-11-08 NOTE — Assessment & Plan Note (Signed)
No angina 

## 2013-11-08 NOTE — Assessment & Plan Note (Signed)
Followed by Dr Clelia Croft, some flushing with Niacin (he is off ASA)- not bad

## 2013-11-08 NOTE — Patient Instructions (Signed)
Your physician recommends that you schedule a follow-up appointment in: 6 months  

## 2013-11-08 NOTE — Assessment & Plan Note (Signed)
He is now on Coumadin and Plavix

## 2013-11-08 NOTE — Progress Notes (Signed)
11/08/2013 Charles Robinson   07-16-1946  [id number]  Primary Physicia Kari Baars, MD Primary Cardiologist: Dr Herbie Baltimore  HPI:  67 y/o previously followed by Dr Clarene Duke, no followed by Dr Herbie Baltimore. He has a history of CAD, he had an LAD stent in 2004, and a CFX/OM PCI June 2012. In September 2012 he was restudied for angina and had OM/CFX disease treated with PCI that was complicated by dissection. Dr Herbie Baltimore ultimately got a good result. He was seen again in April 2013 after an abnormal Myoview suggested ISR. He was studied and had a stent placed to the OM and a PCI as well. He essentially has a full metal jacket OM.CFX. He did have a follow up Myoview in Jan 2014 and this was low risk. He has done well since his last PCI. He denies chest pain. He admits he did have SSCP with exertion before his last PCI. He has had PAF since Sept 2012 but has been holding NSR on Amiodarone. He was on ASA/Effient/ Coumadin previously, but is now just on Coumadin and Plavix.   Current Outpatient Prescriptions  Medication Sig Dispense Refill  . amiodarone (PACERONE) 200 MG tablet TAKE 1 TABLET EVERY DAY  90 tablet  2  . clopidogrel (PLAVIX) 75 MG tablet TAKE 1 TABLET (75 MG TOTAL) BY MOUTH DAILY.  90 tablet  2  . Coenzyme Q10 200 MG capsule Take 200 mg by mouth daily.      [name] diltiazem (DILACOR XR) 180 MG 24 hr capsule Take 1 capsule (180 mg total) by mouth daily.  90 capsule  2  . doxazosin (CARDURA) 8 MG tablet Take 4 mg by mouth every evening. Takes 1/2 tablet      . finasteride (PROSCAR) 5 MG tablet Take 5 mg by mouth daily.      . metoprolol succinate (TOPROL-XL) 25 MG 24 hr tablet Take 1 tablet (25 mg total) by mouth 2 (two) times daily.  180 tablet  2  . niacin (NIASPAN) 1000 MG CR tablet TAKE 2 TABLETS BY MOUTH EVERY NIGHT AT BEDTIME  60 tablet  10  . nitroGLYCERIN (NITROSTAT) 0.4 MG SL tablet Place 0.4 mg under the tongue every 5 (five) minutes as needed. For chest pain      . Omega-3 Fatty Acids (FISH OIL)  1000 MG CAPS Take 1,000 capsules by mouth 2 (two) times daily.       . rosuvastatin (CRESTOR) 20 MG tablet Take 1 tablet (20 mg total) by mouth daily.  90 tablet  2  . warfarin (COUMADIN) 5 MG tablet Take 1 tablet by mouth daily or as directed by coumadin clinic  90 tablet  1   No current facility-administered medications for this visit.    Allergies  Allergen Reactions  . Lipitor [Atorvastatin] Other (See Comments)    Increased HR is currently tolerating --   . Morphine And Related Nausea And Vomiting  . Vicodin [Hydrocodone-Acetaminophen] Other (See Comments)    Feel crazy    History   Social History  . Marital Status: Married    Spouse Name: N/A    Number of Children: N/A  . Years of Education: N/A   Occupational History  . Not on file.   Social History Main Topics  . Smoking status: Never Smoker   . Smokeless tobacco: Never Used  . Alcohol Use: 1.2 oz/week    2 Cans of beer per week     Comment: rare  . Drug Use: No  . Sexual Activity:  Not Currently   Other Topics Concern  . Not on file   Social History Narrative   Charles Robinson is a father of 2, grandfather of 4.  His daughter was classmates with Dr. Herbie Baltimore in college.   He and his wife are currently in the process of moving houses, further down the street in order to downsize to a single-story house.  He has been working out at [employer].           Review of Systems: General: negative for chills, fever, night sweats or weight changes.  Cardiovascular: negative for chest pain, dyspnea on exertion, edema, orthopnea, palpitations, paroxysmal nocturnal dyspnea or shortness of breath Dermatological: negative for rash Respiratory: negative for cough or wheezing Urologic: negative for hematuria Abdominal: negative for nausea, vomiting, diarrhea, bright red blood per rectum, melena, or hematemesis Neurologic: negative for visual changes, syncope, or dizziness Some flushing with Niacin as he is off ASA now All other  systems reviewed and are otherwise negative except as noted above.    Blood pressure 140/80, pulse 58, height 6\' 3"  (1.905 m), weight 261 lb (118.389 kg).  General appearance: alert, cooperative and no distress Lungs: clear to auscultation bilaterally Heart: regular rate and rhythm Extremities: no edema  EKG NSR SB 1st AVB  ASSESSMENT AND PLAN:   CAD- last PCI was April 2013- OM/CFX  PCI for ISR No angina  HYPERLIPIDEMIA Followed by Dr Clelia Croft, some flushing with Niacin (he is off ASA)- not bad  Long term (current) use of anticoagulants He is now on Coumadin and Plavix  HYPERTENSION Controlled   PLAN: Same cardiac Rx. I suggested he have a snack HS with his Niacin. He will see Dr Herbie Baltimore in 6 months.   Meshilem Machuca KPA-C 11/08/2013 2:36 PM

## 2013-11-09 ENCOUNTER — Encounter: Payer: Self-pay | Admitting: Cardiology

## 2013-12-01 ENCOUNTER — Other Ambulatory Visit: Payer: Self-pay | Admitting: Pharmacist Clinician (PhC)/ Clinical Pharmacy Specialist

## 2013-12-19 ENCOUNTER — Ambulatory Visit (INDEPENDENT_AMBULATORY_CARE_PROVIDER_SITE_OTHER): Payer: Medicare Other | Admitting: Pharmacist Clinician (PhC)/ Clinical Pharmacy Specialist

## 2013-12-19 VITALS — BP 156/90 | HR 72

## 2013-12-19 DIAGNOSIS — I4891 Unspecified atrial fibrillation: Secondary | ICD-10-CM

## 2013-12-19 DIAGNOSIS — Z7901 Long term (current) use of anticoagulants: Secondary | ICD-10-CM

## 2013-12-19 LAB — POCT INR: INR: 2.6

## 2014-01-31 ENCOUNTER — Ambulatory Visit (INDEPENDENT_AMBULATORY_CARE_PROVIDER_SITE_OTHER): Payer: Medicare Other | Admitting: Pharmacist Clinician (PhC)/ Clinical Pharmacy Specialist

## 2014-01-31 DIAGNOSIS — I4891 Unspecified atrial fibrillation: Secondary | ICD-10-CM

## 2014-01-31 DIAGNOSIS — Z7901 Long term (current) use of anticoagulants: Secondary | ICD-10-CM

## 2014-01-31 LAB — POCT INR: INR: 1.3

## 2014-02-08 ENCOUNTER — Other Ambulatory Visit: Payer: Self-pay | Admitting: Dermatology

## 2014-02-14 ENCOUNTER — Ambulatory Visit (INDEPENDENT_AMBULATORY_CARE_PROVIDER_SITE_OTHER): Payer: Medicare Other | Admitting: Pharmacist Clinician (PhC)/ Clinical Pharmacy Specialist

## 2014-02-14 VITALS — BP 142/70 | HR 76

## 2014-02-14 DIAGNOSIS — Z7901 Long term (current) use of anticoagulants: Secondary | ICD-10-CM

## 2014-02-14 DIAGNOSIS — I4891 Unspecified atrial fibrillation: Secondary | ICD-10-CM

## 2014-02-14 LAB — POCT INR: INR: 2

## 2014-03-05 ENCOUNTER — Other Ambulatory Visit: Payer: Self-pay | Admitting: *Deleted

## 2014-03-05 MED ORDER — DILTIAZEM HCL ER 180 MG PO CP24: 180.0000 mg | ORAL_CAPSULE | Freq: Every day | ORAL | Status: DC

## 2014-03-12 ENCOUNTER — Telehealth: Payer: Self-pay | Admitting: Cardiology

## 2014-03-13 ENCOUNTER — Ambulatory Visit: Payer: Medicare Other | Admitting: Pharmacist Clinician (PhC)/ Clinical Pharmacy Specialist

## 2014-03-14 ENCOUNTER — Ambulatory Visit: Payer: Medicare Other | Admitting: Pharmacist Clinician (PhC)/ Clinical Pharmacy Specialist

## 2014-03-14 ENCOUNTER — Ambulatory Visit (INDEPENDENT_AMBULATORY_CARE_PROVIDER_SITE_OTHER): Payer: Medicare Other | Admitting: Pharmacist Clinician (PhC)/ Clinical Pharmacy Specialist

## 2014-03-14 DIAGNOSIS — I4891 Unspecified atrial fibrillation: Secondary | ICD-10-CM

## 2014-03-14 DIAGNOSIS — Z7901 Long term (current) use of anticoagulants: Secondary | ICD-10-CM

## 2014-03-14 LAB — POCT INR: INR: 1.9

## 2014-03-14 NOTE — Telephone Encounter (Signed)
Closed encounter °

## 2014-03-14 NOTE — Telephone Encounter (Signed)
Encounter closed---TP 03/14/14

## 2014-04-10 ENCOUNTER — Ambulatory Visit (INDEPENDENT_AMBULATORY_CARE_PROVIDER_SITE_OTHER): Payer: Medicare Other | Admitting: Pharmacist Clinician (PhC)/ Clinical Pharmacy Specialist

## 2014-04-10 DIAGNOSIS — Z7901 Long term (current) use of anticoagulants: Secondary | ICD-10-CM

## 2014-04-10 DIAGNOSIS — I4891 Unspecified atrial fibrillation: Secondary | ICD-10-CM

## 2014-04-10 LAB — POCT INR: INR: 2.4

## 2014-04-11 ENCOUNTER — Other Ambulatory Visit: Payer: Self-pay | Admitting: *Deleted

## 2014-04-11 MED ORDER — AMIODARONE HCL 200 MG PO TABS: ORAL_TABLET | ORAL | Status: DC

## 2014-04-11 NOTE — Telephone Encounter (Signed)
Rx refill sent to patient pharmacy   

## 2014-05-01 ENCOUNTER — Ambulatory Visit (INDEPENDENT_AMBULATORY_CARE_PROVIDER_SITE_OTHER): Payer: Medicare Other | Admitting: *Deleted

## 2014-05-01 ENCOUNTER — Ambulatory Visit (INDEPENDENT_AMBULATORY_CARE_PROVIDER_SITE_OTHER): Payer: Medicare Other | Admitting: Cardiology

## 2014-05-01 ENCOUNTER — Encounter: Payer: Self-pay | Admitting: Cardiology

## 2014-05-01 VITALS — BP 142/90 | HR 53 | Ht 75.0 in | Wt 263.7 lb

## 2014-05-01 DIAGNOSIS — I10 Essential (primary) hypertension: Secondary | ICD-10-CM

## 2014-05-01 DIAGNOSIS — E669 Obesity, unspecified: Secondary | ICD-10-CM

## 2014-05-01 DIAGNOSIS — Z79899 Other long term (current) drug therapy: Secondary | ICD-10-CM

## 2014-05-01 DIAGNOSIS — I4891 Unspecified atrial fibrillation: Secondary | ICD-10-CM

## 2014-05-01 DIAGNOSIS — I2510 Atherosclerotic heart disease of native coronary artery without angina pectoris: Secondary | ICD-10-CM

## 2014-05-01 DIAGNOSIS — Z7901 Long term (current) use of anticoagulants: Secondary | ICD-10-CM

## 2014-05-01 DIAGNOSIS — E785 Hyperlipidemia, unspecified: Secondary | ICD-10-CM

## 2014-05-01 LAB — POCT INR: INR: 2.2

## 2014-05-01 NOTE — Patient Instructions (Addendum)
Stop Diltiazem Keep a list of blood pressure and bring a copy to [place]. Erasmo Downer will follow up about blood pressure at next coumadin clinic visit.  Your physician has recommended that you have a pulmonary function test. Pulmonary Function Tests are a group of tests that measure how well air moves in and out of your lungs.   Your physician recommends that you schedule a follow-up appointment in 6 month with Kerin Ransom PA  Your physician wants you to follow-up in 70 MONTH  Dr Ellyn Hack.  You will receive a reminder letter in the mail two months in advance. If you don't receive a letter, please call our office to schedule the follow-up appointment.

## 2014-05-03 DIAGNOSIS — Z79899 Other long term (current) drug therapy: Secondary | ICD-10-CM | POA: Insufficient documentation

## 2014-05-03 DIAGNOSIS — E669 Obesity, unspecified: Secondary | ICD-10-CM | POA: Insufficient documentation

## 2014-05-03 NOTE — Assessment & Plan Note (Signed)
On Coumadin plus Plavix. INR checked today.

## 2014-05-03 NOTE — Assessment & Plan Note (Signed)
Followed by Dr. Brigitte Pulse. Interestingly his levels to look as good after converting to Crestor from Vytorin. His anterior cruciate ligament is, but now seems to have also increased. Would consider possibly stopping Niaspan in lieu of using Zetia.

## 2014-05-03 NOTE — Assessment & Plan Note (Signed)
Borderline blood pressure today. I suspect as we stop the diltiazem that we would have room to add an ACE inhibitor or ARB. This can be done in a 6 month followup.

## 2014-05-03 NOTE — Progress Notes (Signed)
PCP: [name], MD  Clinic Note: Chief Complaint  Patient presents with  . 6 month visit    no chest pain , no sob , noedema    HPI: Charles Robinson is a 68 y.o. male with a Cardiovascular Problem List below who presents today for one-year followup with me. He saw Kerin Ransom, PA-C. about 6 months ago and was doing quite well. No significant changes were made. As you recall he is a very pleasant gentleman who was a former patient of [name]. He had a history of PCI to the OM branch and was found to have an abnormal Myoview. In September 2012, I performed wheat ended up being a very complicated procedure with extensive dissection of the OM and circumflex requiring multiple stents to be then returned about a year later again with positive stress test in the same distribution was found to have H. short gap between stent to the OM that was stented. He had a Myoview done last January of 2014 that was negative for ischemia. He has had paroxysmal A. fib, it was quite symptomatic and was started on amiodarone. As far as I can tell he has not had any recurrences.  Interval History: He presents today doing well overall. The only complaint he has is that his gained weight. He has been active to some extent and denies any chest tightness or pressure with rest or exertion. No dyspnea with rest or exertion, no PND, orthopnea or edema. He denies any rapid or irregular heart beats. Occasional and now palpitations but nothing that last more than one or 2 beats. No lightheadedness, dizziness, sickness as near syncope, amaurosis fugax/TIA symptoms. No melena, hematochezia, hematuria, epistaxis. His INR levels have been stable. No claudication. No myalgias or arthralgias/cramping.  No excessive coughing or wheezing or other pulmonary symptoms.  Past Medical History  Diagnosis Date  . History of colon polyps   . Hyperlipidemia   . Hypertension   . BPH (benign prostatic hypertrophy)   . Cancer    SKIN CA  . Paroxysmal atrial fibrillation     followed by Dr. Glenetta Hew; s/p DCCV 2011, 07/2011 post PCI with Type 4a MI  . Rupture quadriceps tendon   . Coronary artery disease 03/15/2012    s/p PCI to LAD, to RCA, then extensive PCI to LCx-OM1  (enitre prox-AVG Circ for dissection in 07/2011)  . Chest pain 08/16/2011    Echo - EF 60-65%; moderate LV concentric hypertrophy; abdnormal LV relaxation (grade 1 diastolic dysfunction; ascending aorta mildly dilated; LA moderately dilated;   . Atrial flutter 08/20/2011    TEE- atrial septum - no defect identified; RA normal in size, no evidence of thrombus; ascending aorta normal  . PAF (paroxysmal atrial fibrillation) 12/20/2012    R/L CL - EF 61%; normal EKG, no motion artifact, no evidence of ischemia, normal BP response, low risk stress nuclear study  . Bruit 08/01/2009    doppler - no suggestion of significant diameter reduction, dissection or aneurysmal dilation.  normal evaluation    Prior Cardiac Evaluation and Past Surgical History: Past Surgical History  Procedure Laterality Date  . Coronary stent placement  2003 - 2011 - 2012 - 2013    x 8 stents (per pt)  . Coronary angioplasty with stent placement  03/15/2012    multiple stens in [place]  . Back surgery    . Cardiac catheterization  03/15/2012    patent LAD stents, patent LCx stents w/80%  focal ISR just distal to OM; OM proximal stent open; 2 site PTCA w/ PTCA and stenting of OM2 ang PTCA of distal stent ISR followed by PTCA of mid LCx ISR  . Quadriceps tendon repair  09/19/2012    Procedure: REPAIR QUADRICEP TENDON;  Surgeon: [name], MD;  Location: [facility];  Service: Orthopedics;  Laterality: Left;  . Nm myoview ltd  11/2012    No ischemia or infarction, normal EF & WM  . Doppler echocardiography  07/2011    EF ~60-65%, Grade 1 D Dysfxn; mod Conc LVH    MEDICATIONS AND ALLERGIES REVIEWED IN EPIC No Change in Social and Family History  ROS: A comprehensive  Review of Systems - Negative except Mild arthritis pains  PHYSICAL EXAM BP 142/90  Pulse 53  Ht 6\' 3"  (1.905 m)  Wt 263 lb 11.2 oz (119.614 kg)  BMI 32.96 kg/m2  Weigh / BMI from 03/2013: Wt 246 lb (111.585 kg)  BMI 30.75 kg/m2  General appearance: alert, cooperative, appears stated age, no distress, mildly obese and Very pleasant mood and affect. His weight is down 15 pounds from his last visit weight of 261 pounds with a BMI of 30.75 today  Neck: no adenopathy, no carotid bruit, no JVD, supple, symmetrical, trachea midline and thyroid not enlarged, symmetric, no tenderness/mass/nodules  Lungs: CTAB normal percussion bilaterally and Nonlabored. Normal expansion  Heart: RRR, S1, S2 normal, no murmur, click, rub or gallop and normal apical impulse  Abdomen: soft, non-tender; bowel sounds normal; no masses, no organomegaly and Mildly obese  Extremities: extremities normal, atraumatic, no cyanosis or edema; Pulses: 2+ and symmetric  Neurologic: A&oX 3, normal strength and tone. Normal symmetric reflexes. Normal coordination and gait  HEENT: [place]/AP, EOMI, MMM, anicteric sclera   Adult ECG Report  Rate: 53 ;  Rhythm: sinus bradycardia - otherwise normal EKG.  Recent Labs from 01/2014:   Sodium 140, potassium 4.6, chloride 105, bicarbonate 30, BUN 25, creatinine 1.6, glucose 93;;  Normal LFTs and CBC  Lipid panel: Current (March 2014): Total cholesterol 162 (up from 146), TG 65 (stable), HDL 55 (49), LDL 94 (84); Apolipoprotein B 86  ASSESSMENT / PLAN: CAD- last PCI was April 2013- OM/CFX  PCI for ISR No active angina or heart failure symptoms. Negative Myoview in January 2014 with complete resolution of the ischemia previously noted.  Plan: Would continue Plavix essentially lifelong simply because of all the stents in the past. He is on beta blocker and statin.  He is not on ACE inhibitor/ARB because we have been using diltiazem for rate control. As this will be discontinued, I suspect he may  end up having blood pressure remained at an ACE inhibitor or ARB.  PAF- NSR on Amiodarone No further episodes of A. fib. Remained stable on amiodarone. He is anticoagulated with warfarin. As long as things remain stable, from a thyroid, LFT and ulnar function standpoint, would continue amiodarone for now. Can stop diltiazem to avoid bradycardia. Continue beta blocker.  Basically he did not do well while in atrial fibrillation, diastolic standpoint.  It is not been any recurrence of A. fib at a 6 month followup, if he is interested we could stop his amiodarone for a trial period.  HYPERTENSION Borderline blood pressure today. I suspect as we stop the diltiazem that we would have room to add an ACE inhibitor or ARB. This can be done in a 6 month followup.  HYPERLIPIDEMIA Followed by Dr. Brigitte Pulse. Interestingly his levels to look as  good after converting to Crestor from Vytorin. His anterior cruciate ligament is, but now seems to have also increased. Would consider possibly stopping Niaspan in lieu of using Zetia.  On amiodarone therapy LFTs and TSH have been checked by PCP. He is due for pulmonary function test to be rechecked.  need to have annual eye exams.  Obesity (BMI 30-39.9) I counseled him on the importance of adjusting his diet regimen to avoid excess fatty foods, carbohydrates and simple sugars. Increasing vegetables and fruits. Overall reduced portion sizes and cut out snacking. Increasing exercise.  Long term (current) use of anticoagulants On Coumadin plus Plavix. INR checked today.    Orders Placed This Encounter  Procedures  . EKG 12-Lead    Order Specific Question:  Where should this test be performed    Answer:  OTHER  . Pulmonary Function Test    Standing Status: Future     Number of Occurrences:      Standing Expiration Date: 05/01/2015    Scheduling Instructions:     Dx - using amiodarone    Order Specific Question:  Where should this test be performed?    Answer:   [facility]    Order Specific Question:  Full PFT    Answer:  Yes    Order Specific Question:  Basic spirometry    Answer:  Yes    Order Specific Question:  Spirometry pre & post bronchodilator    Answer:  No    Order Specific Question:  ABG    Answer:  No    Order Specific Question:  Diffusion capacity (DLCO)    Answer:  Yes    Order Specific Question:  MIP/MEP    Answer:  No    Order Specific Question:  Lung volumes    Answer:  Yes    Order Specific Question:  Methacholine challenge    Answer:  No    Order Specific Question:  Exercise induced bronchospasm study (EIB)    Answer:  No    Order Specific Question:  Portable (only available at [facility] and should only be used for critical patients that cannot travel to the pulmonary lab, indicate basic spirometry or pre and post bronchodilator above)    Answer:  No    Order Specific Question:  Portable simple    Answer:  No   No orders of the defined types were placed in this encounter.    Followup: 6 months with Oneida Alar, PA; 12 month with me    Signed, Lattie Riege W. Ellyn Hack, M.D., M.S. Interventional Cardiologist [facility]

## 2014-05-03 NOTE — Assessment & Plan Note (Addendum)
LFTs and TSH have been checked by PCP. He is due for pulmonary function test to be rechecked.  need to have annual eye exams.

## 2014-05-03 NOTE — Assessment & Plan Note (Signed)
I counseled him on the importance of adjusting his diet regimen to avoid excess fatty foods, carbohydrates and simple sugars. Increasing vegetables and fruits. Overall reduced portion sizes and cut out snacking. Increasing exercise.

## 2014-05-03 NOTE — Assessment & Plan Note (Signed)
No active angina or heart failure symptoms. Negative Myoview in January 2014 with complete resolution of the ischemia previously noted.  Plan: Would continue Plavix essentially lifelong simply because of all the stents in the past. He is on beta blocker and statin.  He is not on ACE inhibitor/ARB because we have been using diltiazem for rate control. As this will be discontinued, I suspect he may end up having blood pressure remained at an ACE inhibitor or ARB.

## 2014-05-03 NOTE — Assessment & Plan Note (Addendum)
No further episodes of A. fib. Remained stable on amiodarone. He is anticoagulated with warfarin. As long as things remain stable, from a thyroid, LFT and ulnar function standpoint, would continue amiodarone for now. Can stop diltiazem to avoid bradycardia. Continue beta blocker.  Basically he did not do well while in atrial fibrillation, diastolic standpoint.  It is not been any recurrence of A. fib at a 6 month followup, if he is interested we could stop his amiodarone for a trial period.

## 2014-05-29 ENCOUNTER — Other Ambulatory Visit: Payer: Self-pay | Admitting: Cardiology

## 2014-05-30 ENCOUNTER — Ambulatory Visit: Payer: Medicare Other | Admitting: Pharmacist Clinician (PhC)/ Clinical Pharmacy Specialist

## 2014-05-30 ENCOUNTER — Ambulatory Visit (INDEPENDENT_AMBULATORY_CARE_PROVIDER_SITE_OTHER): Payer: Medicare Other | Admitting: Pharmacist

## 2014-05-30 DIAGNOSIS — I4891 Unspecified atrial fibrillation: Secondary | ICD-10-CM

## 2014-05-30 DIAGNOSIS — Z7901 Long term (current) use of anticoagulants: Secondary | ICD-10-CM

## 2014-05-30 LAB — POCT INR: INR: 2.4

## 2014-05-30 MED ORDER — NIACIN ER (ANTIHYPERLIPIDEMIC) 1000 MG PO TBCR: EXTENDED_RELEASE_TABLET | ORAL | Status: DC

## 2014-05-30 MED ORDER — CLOPIDOGREL BISULFATE 75 MG PO TABS: ORAL_TABLET | ORAL | Status: DC

## 2014-05-30 MED ORDER — METOPROLOL SUCCINATE ER 25 MG PO TB24: 25.0000 mg | ORAL_TABLET | Freq: Two times a day (BID) | ORAL | Status: DC

## 2014-05-30 MED ORDER — ROSUVASTATIN CALCIUM 20 MG PO TABS: 20.0000 mg | ORAL_TABLET | Freq: Every day | ORAL | Status: DC

## 2014-05-30 MED ORDER — AMIODARONE HCL 200 MG PO TABS: ORAL_TABLET | ORAL | Status: DC

## 2014-05-30 MED ORDER — WARFARIN SODIUM 5 MG PO TABS: ORAL_TABLET | ORAL | Status: DC

## 2014-05-30 NOTE — Telephone Encounter (Signed)
No medication to refill

## 2014-05-31 ENCOUNTER — Other Ambulatory Visit: Payer: Self-pay | Admitting: *Deleted

## 2014-06-20 ENCOUNTER — Encounter: Payer: Self-pay | Admitting: Internal Medicine

## 2014-07-06 ENCOUNTER — Ambulatory Visit (INDEPENDENT_AMBULATORY_CARE_PROVIDER_SITE_OTHER): Payer: Medicare Other | Admitting: Pharmacist Clinician (PhC)/ Clinical Pharmacy Specialist

## 2014-07-06 DIAGNOSIS — I4891 Unspecified atrial fibrillation: Secondary | ICD-10-CM

## 2014-07-06 DIAGNOSIS — Z7901 Long term (current) use of anticoagulants: Secondary | ICD-10-CM

## 2014-07-06 LAB — POCT INR: INR: 2.6

## 2014-07-09 ENCOUNTER — Ambulatory Visit: Payer: Medicare Other | Admitting: Pharmacist Clinician (PhC)/ Clinical Pharmacy Specialist

## 2014-07-10 ENCOUNTER — Ambulatory Visit: Payer: Medicare Other | Admitting: Pharmacist Clinician (PhC)/ Clinical Pharmacy Specialist

## 2014-07-11 ENCOUNTER — Other Ambulatory Visit: Payer: Self-pay | Admitting: *Deleted

## 2014-07-11 ENCOUNTER — Ambulatory Visit: Payer: Medicare Other | Admitting: Pharmacist Clinician (PhC)/ Clinical Pharmacy Specialist

## 2014-07-11 MED ORDER — NITROGLYCERIN 0.4 MG SL SUBL: 0.4000 mg | SUBLINGUAL_TABLET | SUBLINGUAL | Status: DC | PRN

## 2014-07-13 ENCOUNTER — Telehealth: Payer: Self-pay | Admitting: Cardiology

## 2014-07-13 MED ORDER — NITROGLYCERIN 0.4 MG SL SUBL: 0.4000 mg | SUBLINGUAL_TABLET | SUBLINGUAL | Status: DC | PRN

## 2014-07-13 NOTE — Telephone Encounter (Signed)
Left message for pt, script sent to [facility] on [address]

## 2014-07-13 NOTE — Telephone Encounter (Signed)
His nitroglycerin was called to [place] should go to [phone].

## 2014-08-03 ENCOUNTER — Telehealth: Payer: Self-pay | Admitting: *Deleted

## 2014-08-03 NOTE — Telephone Encounter (Signed)
FAXED [facility]AR[facility] FOR PROSTATE BX/US - YES, OKAY TO HOLD PLAVIX FOR 5 DAYS, COUMADIN 7 DAYS PER DR HADING.

## 2014-08-09 ENCOUNTER — Telehealth: Payer: Self-pay | Admitting: Cardiology

## 2014-08-10 NOTE — Telephone Encounter (Signed)
Close encounter 

## 2014-08-15 ENCOUNTER — Ambulatory Visit: Payer: Medicare Other | Admitting: Pharmacist Clinician (PhC)/ Clinical Pharmacy Specialist

## 2014-08-22 ENCOUNTER — Other Ambulatory Visit: Payer: Self-pay | Admitting: Dermatology

## 2014-08-22 ENCOUNTER — Ambulatory Visit (INDEPENDENT_AMBULATORY_CARE_PROVIDER_SITE_OTHER): Payer: Medicare Other | Admitting: Pharmacist Clinician (PhC)/ Clinical Pharmacy Specialist

## 2014-08-22 DIAGNOSIS — Z7901 Long term (current) use of anticoagulants: Secondary | ICD-10-CM

## 2014-08-22 DIAGNOSIS — I4891 Unspecified atrial fibrillation: Secondary | ICD-10-CM

## 2014-08-22 LAB — POCT INR: INR: 2.5

## 2014-08-28 ENCOUNTER — Telehealth: Payer: Self-pay | Admitting: Cardiology

## 2014-08-28 NOTE — Telephone Encounter (Signed)
Closed encounter °

## 2014-08-29 ENCOUNTER — Other Ambulatory Visit: Payer: Self-pay | Admitting: Orthopedic Surgery

## 2014-08-29 DIAGNOSIS — M545 Low back pain: Secondary | ICD-10-CM

## 2014-09-02 ENCOUNTER — Ambulatory Visit
Admission: RE | Admit: 2014-09-02 | Discharge: 2014-09-02 | Disposition: A | Discharge status: Home / Self Care | Payer: Medicare Other | Source: Ambulatory Visit | Attending: Orthopedic Surgery | Admitting: Orthopedic Surgery

## 2014-09-02 DIAGNOSIS — M545 Low back pain: Secondary | ICD-10-CM

## 2014-09-06 ENCOUNTER — Other Ambulatory Visit: Payer: Medicare Other

## 2014-09-10 ENCOUNTER — Ambulatory Visit (INDEPENDENT_AMBULATORY_CARE_PROVIDER_SITE_OTHER): Payer: Medicare Other | Admitting: Pharmacist Clinician (PhC)/ Clinical Pharmacy Specialist

## 2014-09-10 DIAGNOSIS — Z7901 Long term (current) use of anticoagulants: Secondary | ICD-10-CM

## 2014-09-10 DIAGNOSIS — I4891 Unspecified atrial fibrillation: Secondary | ICD-10-CM

## 2014-09-10 LAB — POCT INR: INR: 2

## 2014-10-15 ENCOUNTER — Ambulatory Visit (INDEPENDENT_AMBULATORY_CARE_PROVIDER_SITE_OTHER): Payer: Medicare Other | Admitting: Pharmacist Clinician (PhC)/ Clinical Pharmacy Specialist

## 2014-10-15 DIAGNOSIS — I4891 Unspecified atrial fibrillation: Secondary | ICD-10-CM

## 2014-10-15 DIAGNOSIS — Z7901 Long term (current) use of anticoagulants: Secondary | ICD-10-CM

## 2014-10-15 LAB — POCT INR: INR: 2.3

## 2014-10-22 ENCOUNTER — Ambulatory Visit: Payer: Medicare Other | Admitting: Pharmacist Clinician (PhC)/ Clinical Pharmacy Specialist

## 2014-11-01 ENCOUNTER — Encounter (HOSPITAL_COMMUNITY): Payer: Self-pay | Admitting: Cardiology

## 2014-11-08 ENCOUNTER — Other Ambulatory Visit: Payer: Self-pay | Admitting: Cardiology

## 2014-11-09 ENCOUNTER — Ambulatory Visit: Payer: Medicare Other | Admitting: Cardiology

## 2014-11-09 ENCOUNTER — Other Ambulatory Visit: Payer: Self-pay | Admitting: Neurological Surgery

## 2014-11-13 ENCOUNTER — Encounter: Payer: Self-pay | Admitting: Cardiology

## 2014-11-13 ENCOUNTER — Ambulatory Visit (INDEPENDENT_AMBULATORY_CARE_PROVIDER_SITE_OTHER): Payer: Medicare Other | Admitting: *Deleted

## 2014-11-13 ENCOUNTER — Ambulatory Visit (INDEPENDENT_AMBULATORY_CARE_PROVIDER_SITE_OTHER): Payer: Medicare Other | Admitting: Cardiology

## 2014-11-13 VITALS — BP 162/94 | HR 67 | Ht 75.0 in | Wt 266.0 lb

## 2014-11-13 DIAGNOSIS — Z7901 Long term (current) use of anticoagulants: Secondary | ICD-10-CM

## 2014-11-13 DIAGNOSIS — I4891 Unspecified atrial fibrillation: Secondary | ICD-10-CM

## 2014-11-13 DIAGNOSIS — Z79899 Other long term (current) drug therapy: Secondary | ICD-10-CM

## 2014-11-13 DIAGNOSIS — Z01810 Encounter for preprocedural cardiovascular examination: Secondary | ICD-10-CM

## 2014-11-13 DIAGNOSIS — I2510 Atherosclerotic heart disease of native coronary artery without angina pectoris: Secondary | ICD-10-CM

## 2014-11-13 DIAGNOSIS — Z7902 Long term (current) use of antithrombotics/antiplatelets: Secondary | ICD-10-CM | POA: Insufficient documentation

## 2014-11-13 LAB — POCT INR: INR: 2.5

## 2014-11-13 MED ORDER — LISINOPRIL 10 MG PO TABS: 10.0000 mg | ORAL_TABLET | Freq: Every day | ORAL | Status: DC

## 2014-11-13 NOTE — Progress Notes (Signed)
11/13/2014 Charles Robinson   Apr 10, 1946  [id number]  Primary Physician [name], MD Primary Cardiologist: Dr Ellyn Hack  HPI:  68 y/o previously followed by Dr Rex Kras, now followed by Dr Ellyn Hack. He has a history of CAD, he had an LAD stent in 2004, and a CFX/OM PCI June 2012. In September 2012 he was restudied for angina and had OM/CFX disease treated with PCI that was complicated by dissection. Dr Ellyn Hack ultimately got a good result. He was seen again in April 2013 after an abnormal Myoview suggested ISR. He was studied and had a stent placed to the OM and a PCI as well. He essentially has a full metal jacket OM.CFX. He did have a follow up Myoview in Jan 2014 and this was low risk. He has done well since his last PCI. He denies chest pain. He has had PAF since Sept 2012 but has been holding NSR on Amiodarone. He was on ASA/Effient/ Coumadin previously, but is now just on Coumadin and Plavix. He has spinal stenosis and is scheduled for back surgery in Jan with Dr Ellene Route.   Current Outpatient Prescriptions  Medication Sig Dispense Refill  . amiodarone (PACERONE) 200 MG tablet TAKE 1 TABLET EVERY DAY 90 tablet 2  . clopidogrel (PLAVIX) 75 MG tablet TAKE 1 TABLET (75 MG TOTAL) BY MOUTH DAILY. 90 tablet 2  . Coenzyme Q10 200 MG capsule Take 200 mg by mouth daily.    [name] doxazosin (CARDURA) 8 MG tablet Take 4 mg by mouth every evening. Takes 1/2 tablet    . finasteride (PROSCAR) 5 MG tablet Take 5 mg by mouth daily.    . metoprolol succinate (TOPROL-XL) 25 MG 24 hr tablet Take 1 tablet (25 mg total) by mouth 2 (two) times daily. 180 tablet 2  . niacin (NIASPAN) 1000 MG CR tablet TAKE 2 TABLETS BY MOUTH EVERY NIGHT AT BEDTIME 60 tablet 10  . nitroGLYCERIN (NITROSTAT) 0.4 MG SL tablet Place 1 tablet (0.4 mg total) under the tongue every 5 (five) minutes as needed. For chest pain 25 tablet 2  . Omega-3 Fatty Acids (FISH OIL) 1000 MG CAPS Take 1,000 capsules by mouth 2 (two) times daily.     .  rosuvastatin (CRESTOR) 20 MG tablet Take 1 tablet (20 mg total) by mouth daily. 90 tablet 2  . warfarin (COUMADIN) 5 MG tablet TAKE 1 TABLET BY MOUTH DAILY OR AS DIRECTED BY COUMADIN CLINIC 90 tablet 1  . FLUZONE HIGH-DOSE 0.5 ML SUSY   0  . lisinopril (PRINIVIL,ZESTRIL) 10 MG tablet Take 1 tablet (10 mg total) by mouth daily. 30 tablet 6  . methocarbamol (ROBAXIN) 500 MG tablet Take 1 tablet by mouth as needed.  1   No current facility-administered medications for this visit.    Allergies  Allergen Reactions  . Lipitor [Atorvastatin] Other (See Comments)    Increased HR is currently tolerating --   . Morphine And Related Nausea And Vomiting  . Vicodin [Hydrocodone-Acetaminophen] Other (See Comments)    Feel crazy    History   Social History  . Marital Status: Married    Spouse Name: N/A    Number of Children: N/A  . Years of Education: N/A   Occupational History  . Not on file.   Social History Main Topics  . Smoking status: Never Smoker   . Smokeless tobacco: Never Used  . Alcohol Use: 1.2 oz/week    2 Cans of beer per week     Comment: rare  .  Drug Use: No  . Sexual Activity: Not Currently   Other Topics Concern  . Not on file   Social History Narrative   Charles Robinson is a father of 2, grandfather of 27.  His daughter was classmates with Dr. Ellyn Hack in college.   He and his wife are currently in the process of moving houses, further down the street in order to downsize to a single-story house.  He has been working out at [employer].           Review of Systems: General: negative for chills, fever, night sweats or weight changes.  Cardiovascular: negative for chest pain, dyspnea on exertion, edema, orthopnea, palpitations, paroxysmal nocturnal dyspnea or shortness of breath Dermatological: negative for rash Respiratory: negative for cough or wheezing Urologic: negative for hematuria Abdominal: negative for nausea, vomiting, diarrhea, bright red blood per rectum, melena,  or hematemesis Neurologic: negative for visual changes, syncope, or dizziness All other systems reviewed and are otherwise negative except as noted above.    Blood pressure 162/94, pulse 67, height 6\' 3"  (1.905 m), weight 266 lb (120.657 kg).  General appearance: alert, cooperative, no distress and moderately obese Neck: no carotid bruit and no JVD Lungs: clear to auscultation bilaterally Heart: regular rate and rhythm  EKG NSR  ASSESSMENT AND PLAN:   Encounter for pre-operative cardiovascular clearance Pt scheduled to have back surgery 12/08/14 by Dr Ellene Route  CAD- last PCI was April 2013- OM/CFX  PCI for ISR s/p PCI to LAD, to RCA, then extensive PCI to LCx-OM1 .Most recent Myoview: January 2014: Low risk. No recent angina.  PAF- NSR on Amiodarone Holding NSR on Amiodaorne  Essential hypertension Not well controlled. Repeat B/P 145/92.  Dyslipidemia Due for check  Long term current use of anticoagulant therapy Coumadin and Plavix   PLAN  I added Lisinopril 10 mg for HTN. He will get labs in a week or so- CMET, TSH, and fasting lipids. I reviewed his case with Dr Ellyn Hack and he is an acceptable risk for back surgery without further testing. He can hold his Plavix and Coumadin as Dr Ellene Route sees fit.Both should be resumed post op as soon as appropriate. He will see Dr Ellyn Hack in 6 months. He'll track his B/P and let us know if remains high.   [facility] 11/13/2014 8:37 AM

## 2014-11-13 NOTE — Assessment & Plan Note (Signed)
Not well controlled. Repeat B/P 145/92.

## 2014-11-13 NOTE — Assessment & Plan Note (Signed)
Holding NSR on Amiodaorne

## 2014-11-13 NOTE — Assessment & Plan Note (Signed)
Pt scheduled to have back surgery 12/08/14 by Dr Ellene Route

## 2014-11-13 NOTE — Assessment & Plan Note (Addendum)
s/p PCI to LAD, to RCA, then extensive PCI to LCx-OM1 .Most recent Myoview: January 2014: Low risk. No recent angina.

## 2014-11-13 NOTE — Assessment & Plan Note (Signed)
Coumadin and Plavix

## 2014-11-13 NOTE — Assessment & Plan Note (Signed)
Due for check

## 2014-11-13 NOTE — Patient Instructions (Addendum)
Your physician recommends that you return for lab work in 1-2 weeks. The orders sheets have been given to you today. You also will not need a appointment.  Your physician wants you to follow-up in: 6 months or sooner if needed with Dr. Melody Haver will receive a reminder letter in the mail two months in advance. If you don't receive a letter, please call our office to schedule the follow-up appointment.  Your physician has recommended you make the following change in your medication: start new prescription for lisinopril. This has already been sent to your [facility].

## 2014-11-14 ENCOUNTER — Telehealth: Payer: Self-pay | Admitting: *Deleted

## 2014-11-14 NOTE — Telephone Encounter (Signed)
Faxed --signed cardiac clearance and last office note 11/13/14.  clearance for BILATERAL L3-4 LAMINECTOMY schedule for 12/11/14 Per Dr Ellyn Hack-  Hold PLAVIX Kary Kos 5 DAYS PRE-OP RESTART 1-2 D POST-OP

## 2014-11-22 LAB — COMPREHENSIVE METABOLIC PANEL
ALT: 26 U/L (ref 0–53)
AST: 29 U/L (ref 0–37)
Albumin: 4.3 g/dL (ref 3.5–5.2)
Alkaline Phosphatase: 64 U/L (ref 39–117)
BUN: 17 mg/dL (ref 6–23)
CO2: 28 mEq/L (ref 19–32)
Calcium: 9.2 mg/dL (ref 8.4–10.5)
Chloride: 102 mEq/L (ref 96–112)
Creat: 1.42 mg/dL — ABNORMAL HIGH (ref 0.50–1.35)
Glucose, Bld: 97 mg/dL (ref 70–99)
Potassium: 4.3 mEq/L (ref 3.5–5.3)
Sodium: 140 mEq/L (ref 135–145)
Total Bilirubin: 1.9 mg/dL — ABNORMAL HIGH (ref 0.2–1.2)
Total Protein: 6.9 g/dL (ref 6.0–8.3)

## 2014-11-22 LAB — LIPID PANEL
Cholesterol: 184 mg/dL (ref 0–200)
HDL: 64 mg/dL (ref >39)
LDL Cholesterol: 100 mg/dL — ABNORMAL HIGH (ref 0–99)
Total CHOL/HDL Ratio: 2.9 Ratio
Triglycerides: 100 mg/dL (ref <150)
VLDL: 20 mg/dL (ref 0–40)

## 2014-11-22 LAB — TSH: TSH: 3.688 u[IU]/mL (ref 0.350–4.500)

## 2014-12-04 NOTE — Pre-Procedure Instructions (Signed)
KYLEN SCHLIEP  12/04/2014   Your procedure is scheduled on:  Tuesday, January 19th  Report to [facility] Admitting at 530 AM.  Call this number if you have problems the morning of surgery: [phone]   Remember:   Do not eat food or drink liquids after midnight.   Take these medicines the morning of surgery with A SIP OF WATER: amiodarone, proscar, toprol  Stop taking aspirin, OTC vitamins/herbal medications, NSAIDS (ibuprofen, advil, motrin) 7 days prior to surgery.  Stop coumadin/plavix per MD instructions    Do not wear jewelry  Do not wear lotions, powders, or perfume, deodorant.  Do not shave 48 hours prior to surgery. Men may shave face and neck.  Do not bring valuables to the hospital.  [facility] is not responsible for any belongings or valuables.               Contacts, dentures or bridgework may not be worn into surgery.  Leave suitcase in the car. After surgery it may be brought to your room.  For patients admitted to the hospital, discharge time is determined by your   treatment team.               Patients discharged the day of surgery will not be allowed to drive home.  Please read over the following fact sheets that you were given: Pain Booklet, Coughing and Deep Breathing, MRSA Information and Surgical Site Infection Prevention  [place] - Preparing for Surgery  Before surgery, you can play an important role.  Because skin is not sterile, your skin needs to be as free of germs as possible.  You can reduce the number of germs on you skin by washing with CHG (chlorahexidine gluconate) soap before surgery.  CHG is an antiseptic cleaner which kills germs and bonds with the skin to continue killing germs even after washing.  Please DO NOT use if you have an allergy to CHG or antibacterial soaps.  If your skin becomes reddened/irritated stop using the CHG and inform your nurse when you arrive at Short Stay.  Do not shave (including legs and underarms) for  at least 48 hours prior to the first CHG shower.  You may shave your face.  Please follow these instructions carefully:   1.  Shower with CHG Soap the night before surgery and the morning of Surgery.  2.  If you choose to wash your hair, wash your hair first as usual with your normal shampoo.  3.  After you shampoo, rinse your hair and body thoroughly to remove the shampoo.  4.  Use CHG as you would any other liquid soap.  You can apply CHG directly to the skin and wash gently with scrungie or a clean washcloth.  5.  Apply the CHG Soap to your body ONLY FROM THE NECK DOWN.  Do not use on open wounds or open sores.  Avoid contact with your eyes, ears, mouth and genitals (private parts).  Wash genitals (private parts) with your normal soap.  6.  Wash thoroughly, paying special attention to the area where your surgery will be performed.  7.  Thoroughly rinse your body with warm water from the neck down.  8.  DO NOT shower/wash with your normal soap after using and rinsing off the CHG Soap.  9.  Pat yourself dry with a clean towel.            10.  Wear clean pajamas.  11.  Place clean sheets on your bed the night of your first shower and do not sleep with pets.  Day of Surgery  Do not apply any lotions/deoderants the morning of surgery.  Please wear clean clothes to the hospital/surgery center.

## 2014-12-05 ENCOUNTER — Encounter (HOSPITAL_COMMUNITY): Payer: Self-pay

## 2014-12-05 ENCOUNTER — Encounter (HOSPITAL_COMMUNITY)
Admission: RE | Admit: 2014-12-05 | Discharge: 2014-12-05 | Disposition: A | Discharge status: Home / Self Care | Payer: Medicare Other | Source: Ambulatory Visit | Attending: Neurological Surgery | Admitting: Neurological Surgery

## 2014-12-05 DIAGNOSIS — Z01818 Encounter for other preprocedural examination: Secondary | ICD-10-CM | POA: Insufficient documentation

## 2014-12-05 DIAGNOSIS — Z01812 Encounter for preprocedural laboratory examination: Secondary | ICD-10-CM | POA: Insufficient documentation

## 2014-12-05 DIAGNOSIS — M4806 Spinal stenosis, lumbar region: Secondary | ICD-10-CM | POA: Diagnosis not present

## 2014-12-05 HISTORY — DX: Gastro-esophageal reflux disease without esophagitis: K21.9

## 2014-12-05 HISTORY — DX: Unspecified osteoarthritis, unspecified site: M19.90

## 2014-12-05 LAB — CBC
HCT: 44.7 % (ref 39.0–52.0)
Hemoglobin: 15.8 g/dL (ref 13.0–17.0)
MCH: 33.1 pg (ref 26.0–34.0)
MCHC: 35.3 g/dL (ref 30.0–36.0)
MCV: 93.5 fL (ref 78.0–100.0)
Platelets: 119 10*3/uL — ABNORMAL LOW (ref 150–400)
RBC: 4.78 MIL/uL (ref 4.22–5.81)
RDW: 13.5 % (ref 11.5–15.5)
WBC: 3.8 10*3/uL — ABNORMAL LOW (ref 4.0–10.5)

## 2014-12-05 LAB — BASIC METABOLIC PANEL
Anion gap: 5 (ref 5–15)
BUN: 19 mg/dL (ref 6–23)
CO2: 29 mmol/L (ref 19–32)
Calcium: 8.9 mg/dL (ref 8.4–10.5)
Chloride: 102 mEq/L (ref 96–112)
Creatinine, Ser: 1.46 mg/dL — ABNORMAL HIGH (ref 0.50–1.35)
GFR calc Af Amer: 54 mL/min — ABNORMAL LOW (ref >90)
GFR calc non Af Amer: 47 mL/min — ABNORMAL LOW (ref >90)
Glucose, Bld: 101 mg/dL — ABNORMAL HIGH (ref 70–99)
Potassium: 4.4 mmol/L (ref 3.5–5.1)
Sodium: 136 mmol/L (ref 135–145)

## 2014-12-05 LAB — SURGICAL PCR SCREEN
MRSA, PCR: NEGATIVE
Staphylococcus aureus: NEGATIVE

## 2014-12-05 NOTE — Progress Notes (Signed)
   12/05/14 0821  OBSTRUCTIVE SLEEP APNEA  Have you ever been diagnosed with sleep apnea through a sleep study? No  Do you snore loudly (loud enough to be heard through closed doors)?  0  Do you often feel tired, fatigued, or sleepy during the daytime? 0  Has anyone observed you stop breathing during your sleep? 0  Do you have, or are you being treated for high blood pressure? 1  BMI more than 35 kg/m2? 0  Age over 71 years old? 1  Neck circumference greater than 40 cm/16 inches? 1  Gender: 1  Obstructive Sleep Apnea Score 4  Score 4 or greater  Results sent to PCP

## 2014-12-10 MED ORDER — DEXTROSE 5 % IV SOLN
3.0000 g | INTRAVENOUS | Status: AC
  Administered 2014-12-11: 3 g via INTRAVENOUS
  Filled 2014-12-10: qty 3000

## 2014-12-11 ENCOUNTER — Ambulatory Visit (HOSPITAL_COMMUNITY): Payer: Medicare Other | Admitting: Anesthesiology

## 2014-12-11 ENCOUNTER — Encounter (HOSPITAL_COMMUNITY): Payer: Self-pay | Admitting: Surgery

## 2014-12-11 ENCOUNTER — Encounter (HOSPITAL_COMMUNITY)
Admission: RE | Disposition: A | Discharge status: Home / Self Care | Payer: Self-pay | Source: Ambulatory Visit | Attending: Neurological Surgery

## 2014-12-11 ENCOUNTER — Ambulatory Visit (HOSPITAL_COMMUNITY): Payer: Medicare Other

## 2014-12-11 ENCOUNTER — Inpatient Hospital Stay (HOSPITAL_COMMUNITY)
Admission: RE | Admit: 2014-12-11 | Discharge: 2014-12-13 | DRG: 519 | Disposition: A | Discharge status: Home / Self Care | Payer: Medicare Other | Source: Ambulatory Visit | Attending: Neurological Surgery | Admitting: Neurological Surgery

## 2014-12-11 DIAGNOSIS — I10 Essential (primary) hypertension: Secondary | ICD-10-CM | POA: Diagnosis present

## 2014-12-11 DIAGNOSIS — Z419 Encounter for procedure for purposes other than remedying health state, unspecified: Secondary | ICD-10-CM

## 2014-12-11 DIAGNOSIS — R350 Frequency of micturition: Secondary | ICD-10-CM | POA: Diagnosis not present

## 2014-12-11 DIAGNOSIS — M4716 Other spondylosis with myelopathy, lumbar region: Principal | ICD-10-CM | POA: Diagnosis present

## 2014-12-11 DIAGNOSIS — Z85828 Personal history of other malignant neoplasm of skin: Secondary | ICD-10-CM

## 2014-12-11 DIAGNOSIS — M5106 Intervertebral disc disorders with myelopathy, lumbar region: Secondary | ICD-10-CM | POA: Diagnosis present

## 2014-12-11 DIAGNOSIS — Z79899 Other long term (current) drug therapy: Secondary | ICD-10-CM

## 2014-12-11 DIAGNOSIS — M4806 Spinal stenosis, lumbar region: Secondary | ICD-10-CM | POA: Diagnosis present

## 2014-12-11 DIAGNOSIS — Z7901 Long term (current) use of anticoagulants: Secondary | ICD-10-CM | POA: Diagnosis not present

## 2014-12-11 DIAGNOSIS — M4726 Other spondylosis with radiculopathy, lumbar region: Secondary | ICD-10-CM | POA: Diagnosis present

## 2014-12-11 DIAGNOSIS — R339 Retention of urine, unspecified: Secondary | ICD-10-CM | POA: Diagnosis present

## 2014-12-11 DIAGNOSIS — M48062 Spinal stenosis, lumbar region with neurogenic claudication: Secondary | ICD-10-CM | POA: Diagnosis present

## 2014-12-11 HISTORY — PX: LUMBAR LAMINECTOMY/DECOMPRESSION MICRODISCECTOMY: SHX5026

## 2014-12-11 LAB — PROTIME-INR
INR: 1.11 (ref 0.00–1.49)
Prothrombin Time: 14.4 seconds (ref 11.6–15.2)

## 2014-12-11 LAB — APTT: aPTT: 28 seconds (ref 24–37)

## 2014-12-11 SURGERY — LUMBAR LAMINECTOMY/DECOMPRESSION MICRODISCECTOMY 1 LEVEL
Anesthesia: General | Site: Spine Lumbar | Laterality: Bilateral

## 2014-12-11 MED ORDER — ARTIFICIAL TEARS OP OINT
TOPICAL_OINTMENT | OPHTHALMIC | Status: DC | PRN
  Administered 2014-12-11: 1 via OPHTHALMIC

## 2014-12-11 MED ORDER — PHENYLEPHRINE HCL 10 MG/ML IJ SOLN
INTRAMUSCULAR | Status: DC | PRN
  Administered 2014-12-11: 60 ug via INTRAVENOUS
  Administered 2014-12-11: 100 ug via INTRAVENOUS

## 2014-12-11 MED ORDER — ONDANSETRON HCL 4 MG/2ML IJ SOLN
4.0000 mg | INTRAMUSCULAR | Status: DC | PRN
  Administered 2014-12-11: 4 mg via INTRAVENOUS
  Filled 2014-12-11: qty 2

## 2014-12-11 MED ORDER — SENNA 8.6 MG PO TABS
1.0000 | ORAL_TABLET | Freq: Two times a day (BID) | ORAL | Status: DC
  Administered 2014-12-12 (×2): 8.6 mg via ORAL
  Filled 2014-12-11 (×5): qty 1

## 2014-12-11 MED ORDER — GLYCOPYRROLATE 0.2 MG/ML IJ SOLN
INTRAMUSCULAR | Status: DC | PRN
  Administered 2014-12-11: .2 mg via INTRAVENOUS
  Administered 2014-12-11: .8 mg via INTRAVENOUS

## 2014-12-11 MED ORDER — LIDOCAINE HCL (CARDIAC) 20 MG/ML IV SOLN
INTRAVENOUS | Status: DC | PRN
  Administered 2014-12-11: 75 mg via INTRAVENOUS

## 2014-12-11 MED ORDER — HYDROMORPHONE HCL 1 MG/ML IJ SOLN
0.2500 mg | INTRAMUSCULAR | Status: DC | PRN
  Administered 2014-12-11 (×2): 0.5 mg via INTRAVENOUS

## 2014-12-11 MED ORDER — FENTANYL CITRATE 0.05 MG/ML IJ SOLN
INTRAMUSCULAR | Status: AC
  Filled 2014-12-11: qty 5

## 2014-12-11 MED ORDER — SODIUM CHLORIDE 0.9 % IJ SOLN
3.0000 mL | Freq: Two times a day (BID) | INTRAMUSCULAR | Status: DC
  Administered 2014-12-11 (×2): 3 mL via INTRAVENOUS

## 2014-12-11 MED ORDER — ROCURONIUM BROMIDE 50 MG/5ML IV SOLN
INTRAVENOUS | Status: AC
  Filled 2014-12-11: qty 2

## 2014-12-11 MED ORDER — METOPROLOL SUCCINATE ER 25 MG PO TB24
25.0000 mg | ORAL_TABLET | Freq: Two times a day (BID) | ORAL | Status: DC
Start: 2014-12-11 — End: 2014-12-13
  Administered 2014-12-11 – 2014-12-12 (×3): 25 mg via ORAL
  Filled 2014-12-11 (×6): qty 1

## 2014-12-11 MED ORDER — FINASTERIDE 5 MG PO TABS
5.0000 mg | ORAL_TABLET | Freq: Every day | ORAL | Status: DC
  Administered 2014-12-11 – 2014-12-12 (×2): 5 mg via ORAL
  Filled 2014-12-11 (×3): qty 1

## 2014-12-11 MED ORDER — POLYETHYLENE GLYCOL 3350 17 G PO PACK
17.0000 g | PACK | Freq: Every day | ORAL | Status: DC | PRN
  Filled 2014-12-11: qty 1

## 2014-12-11 MED ORDER — SUCCINYLCHOLINE CHLORIDE 20 MG/ML IJ SOLN
INTRAMUSCULAR | Status: AC
  Filled 2014-12-11: qty 1

## 2014-12-11 MED ORDER — ONDANSETRON HCL 4 MG/2ML IJ SOLN
INTRAMUSCULAR | Status: DC | PRN
  Administered 2014-12-11: 4 mg via INTRAVENOUS

## 2014-12-11 MED ORDER — BUPIVACAINE HCL (PF) 0.5 % IJ SOLN
INTRAMUSCULAR | Status: DC | PRN
  Administered 2014-12-11: 5 mL
  Administered 2014-12-11: 15 mL

## 2014-12-11 MED ORDER — MENTHOL 3 MG MT LOZG: 1.0000 | LOZENGE | OROMUCOSAL | Status: DC | PRN

## 2014-12-11 MED ORDER — PHENYLEPHRINE HCL 10 MG/ML IJ SOLN
INTRAMUSCULAR | Status: AC
  Filled 2014-12-11: qty 1

## 2014-12-11 MED ORDER — LACTATED RINGERS IV SOLN
INTRAVENOUS | Status: DC | PRN
  Administered 2014-12-11 (×2): via INTRAVENOUS

## 2014-12-11 MED ORDER — DOCUSATE SODIUM 100 MG PO CAPS
100.0000 mg | ORAL_CAPSULE | Freq: Two times a day (BID) | ORAL | Status: DC
  Administered 2014-12-11 – 2014-12-12 (×3): 100 mg via ORAL
  Filled 2014-12-11 (×3): qty 1

## 2014-12-11 MED ORDER — KETOROLAC TROMETHAMINE 15 MG/ML IJ SOLN
15.0000 mg | Freq: Four times a day (QID) | INTRAMUSCULAR | Status: AC
  Administered 2014-12-11 – 2014-12-12 (×3): 15 mg via INTRAVENOUS
  Filled 2014-12-11 (×4): qty 1

## 2014-12-11 MED ORDER — METHOCARBAMOL 1000 MG/10ML IJ SOLN
500.0000 mg | Freq: Four times a day (QID) | INTRAVENOUS | Status: DC | PRN
  Filled 2014-12-11: qty 5

## 2014-12-11 MED ORDER — HEMOSTATIC AGENTS (NO CHARGE) OPTIME
TOPICAL | Status: DC | PRN
  Administered 2014-12-11: 1 via TOPICAL

## 2014-12-11 MED ORDER — SODIUM CHLORIDE 0.9 % IJ SOLN: 3.0000 mL | INTRAMUSCULAR | Status: DC | PRN

## 2014-12-11 MED ORDER — GLYCOPYRROLATE 0.2 MG/ML IJ SOLN
INTRAMUSCULAR | Status: AC
  Filled 2014-12-11: qty 1

## 2014-12-11 MED ORDER — ALBUMIN HUMAN 5 % IV SOLN
INTRAVENOUS | Status: DC | PRN
  Administered 2014-12-11: 09:00:00 via INTRAVENOUS

## 2014-12-11 MED ORDER — PROPOFOL 10 MG/ML IV BOLUS
INTRAVENOUS | Status: DC | PRN
  Administered 2014-12-11: 200 mg via INTRAVENOUS

## 2014-12-11 MED ORDER — DOXAZOSIN MESYLATE 4 MG PO TABS
4.0000 mg | ORAL_TABLET | Freq: Every evening | ORAL | Status: DC
  Administered 2014-12-11 – 2014-12-12 (×2): 4 mg via ORAL
  Filled 2014-12-11 (×3): qty 1

## 2014-12-11 MED ORDER — LIDOCAINE-EPINEPHRINE 1 %-1:100000 IJ SOLN
INTRAMUSCULAR | Status: DC | PRN
  Administered 2014-12-11: 5 mL

## 2014-12-11 MED ORDER — METHOCARBAMOL 500 MG PO TABS
500.0000 mg | ORAL_TABLET | Freq: Four times a day (QID) | ORAL | Status: DC | PRN
  Administered 2014-12-11 – 2014-12-13 (×2): 500 mg via ORAL
  Filled 2014-12-11 (×2): qty 1

## 2014-12-11 MED ORDER — ONDANSETRON HCL 4 MG/2ML IJ SOLN
INTRAMUSCULAR | Status: AC
  Filled 2014-12-11: qty 2

## 2014-12-11 MED ORDER — NEOSTIGMINE METHYLSULFATE 10 MG/10ML IV SOLN
INTRAVENOUS | Status: DC | PRN
  Administered 2014-12-11: 4 mg via INTRAVENOUS

## 2014-12-11 MED ORDER — SODIUM CHLORIDE 0.9 % IR SOLN
Status: DC | PRN
  Administered 2014-12-11: 500 mL

## 2014-12-11 MED ORDER — PROMETHAZINE HCL 25 MG/ML IJ SOLN: 6.2500 mg | INTRAMUSCULAR | Status: DC | PRN

## 2014-12-11 MED ORDER — PHENYLEPHRINE HCL 10 MG/ML IJ SOLN
10.0000 mg | INTRAVENOUS | Status: DC | PRN
  Administered 2014-12-11: 50 ug/min via INTRAVENOUS

## 2014-12-11 MED ORDER — HYDROMORPHONE HCL 1 MG/ML IJ SOLN
INTRAMUSCULAR | Status: AC
  Filled 2014-12-11: qty 1

## 2014-12-11 MED ORDER — ROSUVASTATIN CALCIUM 20 MG PO TABS
20.0000 mg | ORAL_TABLET | Freq: Every day | ORAL | Status: DC
  Administered 2014-12-11 – 2014-12-12 (×2): 20 mg via ORAL
  Filled 2014-12-11 (×3): qty 1

## 2014-12-11 MED ORDER — DEXAMETHASONE SODIUM PHOSPHATE 10 MG/ML IJ SOLN
INTRAMUSCULAR | Status: AC
  Filled 2014-12-11: qty 1

## 2014-12-11 MED ORDER — LISINOPRIL 10 MG PO TABS
10.0000 mg | ORAL_TABLET | Freq: Every day | ORAL | Status: DC
  Administered 2014-12-11 – 2014-12-12 (×2): 10 mg via ORAL
  Filled 2014-12-11 (×3): qty 1

## 2014-12-11 MED ORDER — ARTIFICIAL TEARS OP OINT
TOPICAL_OINTMENT | OPHTHALMIC | Status: AC
  Filled 2014-12-11: qty 3.5

## 2014-12-11 MED ORDER — PHENOL 1.4 % MT LIQD: 1.0000 | OROMUCOSAL | Status: DC | PRN

## 2014-12-11 MED ORDER — GLYCOPYRROLATE 0.2 MG/ML IJ SOLN
INTRAMUSCULAR | Status: AC
  Filled 2014-12-11: qty 5

## 2014-12-11 MED ORDER — HYDRALAZINE HCL 20 MG/ML IJ SOLN
10.0000 mg | Freq: Once | INTRAMUSCULAR | Status: AC
  Administered 2014-12-11: 10 mg via INTRAVENOUS

## 2014-12-11 MED ORDER — OXYCODONE-ACETAMINOPHEN 5-325 MG PO TABS
1.0000 | ORAL_TABLET | ORAL | Status: DC | PRN
  Filled 2014-12-11: qty 1

## 2014-12-11 MED ORDER — SODIUM CHLORIDE 0.9 % IJ SOLN
INTRAMUSCULAR | Status: AC
  Filled 2014-12-11: qty 10

## 2014-12-11 MED ORDER — HYDROMORPHONE HCL 1 MG/ML IJ SOLN: 0.5000 mg | INTRAMUSCULAR | Status: DC | PRN

## 2014-12-11 MED ORDER — DEXAMETHASONE 2 MG PO TABS
2.0000 mg | ORAL_TABLET | Freq: Two times a day (BID) | ORAL | Status: DC
  Administered 2014-12-11 – 2014-12-12 (×4): 2 mg via ORAL
  Filled 2014-12-11 (×6): qty 1

## 2014-12-11 MED ORDER — ACETAMINOPHEN 650 MG RE SUPP: 650.0000 mg | RECTAL | Status: DC | PRN

## 2014-12-11 MED ORDER — ROCURONIUM BROMIDE 100 MG/10ML IV SOLN
INTRAVENOUS | Status: DC | PRN
  Administered 2014-12-11: 60 mg via INTRAVENOUS

## 2014-12-11 MED ORDER — ALUM & MAG HYDROXIDE-SIMETH 200-200-20 MG/5ML PO SUSP
30.0000 mL | Freq: Four times a day (QID) | ORAL | Status: DC | PRN
  Filled 2014-12-11: qty 30

## 2014-12-11 MED ORDER — BISACODYL 10 MG RE SUPP: 10.0000 mg | Freq: Every day | RECTAL | Status: DC | PRN

## 2014-12-11 MED ORDER — PROPOFOL 10 MG/ML IV BOLUS
INTRAVENOUS | Status: AC
  Filled 2014-12-11: qty 20

## 2014-12-11 MED ORDER — EPHEDRINE SULFATE 50 MG/ML IJ SOLN
INTRAMUSCULAR | Status: AC
  Filled 2014-12-11: qty 1

## 2014-12-11 MED ORDER — LIDOCAINE HCL (CARDIAC) 20 MG/ML IV SOLN
INTRAVENOUS | Status: AC
  Filled 2014-12-11: qty 5

## 2014-12-11 MED ORDER — 0.9 % SODIUM CHLORIDE (POUR BTL) OPTIME
TOPICAL | Status: DC | PRN
  Administered 2014-12-11: 1000 mL

## 2014-12-11 MED ORDER — THROMBIN 5000 UNITS EX SOLR
CUTANEOUS | Status: DC | PRN
  Administered 2014-12-11 (×2): 5000 [IU] via TOPICAL

## 2014-12-11 MED ORDER — HYDRALAZINE HCL 20 MG/ML IJ SOLN
INTRAMUSCULAR | Status: AC
  Filled 2014-12-11: qty 1

## 2014-12-11 MED ORDER — AMIODARONE HCL 200 MG PO TABS
200.0000 mg | ORAL_TABLET | Freq: Every day | ORAL | Status: DC
  Administered 2014-12-12: 200 mg via ORAL
  Filled 2014-12-11 (×3): qty 1

## 2014-12-11 MED ORDER — FENTANYL CITRATE 0.05 MG/ML IJ SOLN
INTRAMUSCULAR | Status: DC | PRN
  Administered 2014-12-11: 50 ug via INTRAVENOUS
  Administered 2014-12-11: 100 ug via INTRAVENOUS

## 2014-12-11 MED ORDER — DEXAMETHASONE SODIUM PHOSPHATE 10 MG/ML IJ SOLN
INTRAMUSCULAR | Status: DC | PRN
  Administered 2014-12-11: 10 mg via INTRAVENOUS

## 2014-12-11 MED ORDER — EPHEDRINE SULFATE 50 MG/ML IJ SOLN
INTRAMUSCULAR | Status: DC | PRN
  Administered 2014-12-11: 10 mg via INTRAVENOUS
  Administered 2014-12-11: 15 mg via INTRAVENOUS
  Administered 2014-12-11: 10 mg via INTRAVENOUS

## 2014-12-11 MED ORDER — ACETAMINOPHEN 325 MG PO TABS
650.0000 mg | ORAL_TABLET | ORAL | Status: DC | PRN
  Administered 2014-12-12 – 2014-12-13 (×2): 650 mg via ORAL
  Filled 2014-12-11 (×2): qty 2

## 2014-12-11 MED ORDER — FLEET ENEMA 7-19 GM/118ML RE ENEM
1.0000 | ENEMA | Freq: Once | RECTAL | Status: AC | PRN
  Filled 2014-12-11: qty 1

## 2014-12-11 SURGICAL SUPPLY — 53 items
BAG DECANTER FOR FLEXI CONT (MISCELLANEOUS) ×3 IMPLANT
BLADE CLIPPER SURG (BLADE) IMPLANT
BUR ACORN 6.0 (BURR) ×1 IMPLANT
BUR ACORN 6.0MM (BURR) ×1
BUR MATCHSTICK NEURO 3.0 LAGG (BURR) ×3 IMPLANT
CANISTER SUCT 3000ML (MISCELLANEOUS) ×3 IMPLANT
CONT SPEC 4OZ CLIKSEAL STRL BL (MISCELLANEOUS) ×3 IMPLANT
DECANTER SPIKE VIAL GLASS SM (MISCELLANEOUS) ×3 IMPLANT
DRAPE LAPAROTOMY T 102X78X121 (DRAPES) ×3 IMPLANT
DRAPE MICROSCOPE LEICA (MISCELLANEOUS) ×2 IMPLANT
DRAPE POUCH INSTRU U-SHP 10X18 (DRAPES) ×3 IMPLANT
DRAPE PROXIMA HALF (DRAPES) IMPLANT
DURAPREP 26ML APPLICATOR (WOUND CARE) ×3 IMPLANT
ELECT REM PT RETURN 9FT ADLT (ELECTROSURGICAL) ×3
ELECTRODE REM PT RTRN 9FT ADLT (ELECTROSURGICAL) ×1 IMPLANT
GAUZE SPONGE 4X4 12PLY STRL (GAUZE/BANDAGES/DRESSINGS) ×1 IMPLANT
GAUZE SPONGE 4X4 16PLY XRAY LF (GAUZE/BANDAGES/DRESSINGS) IMPLANT
GLOVE BIOGEL PI IND STRL 7.5 (GLOVE) IMPLANT
GLOVE BIOGEL PI IND STRL 8.5 (GLOVE) ×1 IMPLANT
GLOVE BIOGEL PI INDICATOR 7.5 (GLOVE) ×4
GLOVE BIOGEL PI INDICATOR 8.5 (GLOVE) ×2
GLOVE ECLIPSE 8.0 STRL XLNG CF (GLOVE) ×2 IMPLANT
GLOVE ECLIPSE 8.5 STRL (GLOVE) ×3 IMPLANT
GLOVE EXAM NITRILE LRG STRL (GLOVE) IMPLANT
GLOVE EXAM NITRILE MD LF STRL (GLOVE) IMPLANT
GLOVE EXAM NITRILE XL STR (GLOVE) IMPLANT
GLOVE EXAM NITRILE XS STR PU (GLOVE) IMPLANT
GLOVE SURG SS PI 7.0 STRL IVOR (GLOVE) ×6 IMPLANT
GOWN STRL REUS W/ TWL LRG LVL3 (GOWN DISPOSABLE) IMPLANT
GOWN STRL REUS W/ TWL XL LVL3 (GOWN DISPOSABLE) IMPLANT
GOWN STRL REUS W/TWL 2XL LVL3 (GOWN DISPOSABLE) ×3 IMPLANT
GOWN STRL REUS W/TWL LRG LVL3 (GOWN DISPOSABLE) ×3
GOWN STRL REUS W/TWL XL LVL3 (GOWN DISPOSABLE) ×3
KIT BASIN OR (CUSTOM PROCEDURE TRAY) ×3 IMPLANT
KIT ROOM TURNOVER OR (KITS) ×3 IMPLANT
LIQUID BAND (GAUZE/BANDAGES/DRESSINGS) ×3 IMPLANT
NDL SPNL 20GX3.5 QUINCKE YW (NEEDLE) IMPLANT
NEEDLE HYPO 22GX1.5 SAFETY (NEEDLE) ×3 IMPLANT
NEEDLE SPNL 20GX3.5 QUINCKE YW (NEEDLE) ×3 IMPLANT
NS IRRIG 1000ML POUR BTL (IV SOLUTION) ×3 IMPLANT
PACK LAMINECTOMY NEURO (CUSTOM PROCEDURE TRAY) ×3 IMPLANT
PAD ARMBOARD 7.5X6 YLW CONV (MISCELLANEOUS) ×13 IMPLANT
PATTIES SURGICAL .5 X1 (DISPOSABLE) ×1 IMPLANT
RUBBERBAND STERILE (MISCELLANEOUS) ×4 IMPLANT
SPONGE SURGIFOAM ABS GEL SZ50 (HEMOSTASIS) ×3 IMPLANT
SUT VIC AB 1 CT1 18XBRD ANBCTR (SUTURE) ×1 IMPLANT
SUT VIC AB 1 CT1 8-18 (SUTURE) ×3
SUT VIC AB 2-0 CP2 18 (SUTURE) ×3 IMPLANT
SUT VIC AB 3-0 SH 8-18 (SUTURE) ×3 IMPLANT
SYR 20ML ECCENTRIC (SYRINGE) ×3 IMPLANT
TOWEL OR 17X24 6PK STRL BLUE (TOWEL DISPOSABLE) ×3 IMPLANT
TOWEL OR 17X26 10 PK STRL BLUE (TOWEL DISPOSABLE) ×3 IMPLANT
WATER STERILE IRR 1000ML POUR (IV SOLUTION) ×3 IMPLANT

## 2014-12-11 NOTE — Anesthesia Procedure Notes (Signed)
Procedure Name: Intubation Date/Time: 12/11/2014 7:56 AM Performed by: Neldon Newport Pre-anesthesia Checklist: Patient being monitored, Suction available, Emergency Drugs available, Patient identified and Timeout performed Patient Re-evaluated:Patient Re-evaluated prior to inductionOxygen Delivery Method: Circle system utilized Preoxygenation: Pre-oxygenation with 100% oxygen Intubation Type: IV induction Ventilation: Mask ventilation without difficulty and Oral airway inserted - appropriate to patient size Laryngoscope Size: Mac and 4 Grade View: Grade II Tube type: Oral Tube size: 8.0 mm Number of attempts: 1 Airway Equipment and Method: Stylet (Anterior anatomy) Placement Confirmation: positive ETCO2,  ETT inserted through vocal cords under direct vision and breath sounds checked- equal and bilateral Secured at: 23 cm Tube secured with: Tape Dental Injury: Teeth and Oropharynx as per pre-operative assessment

## 2014-12-11 NOTE — Plan of Care (Signed)
Problem: Consults Goal: Diagnosis - Spinal Surgery Outcome: Completed/Met Date Met:  12/11/14 Lumbar Laminectomy (Complex)     

## 2014-12-11 NOTE — Anesthesia Postprocedure Evaluation (Signed)
  Anesthesia Post-op Note  Patient: Charles Robinson  Procedure(s) Performed: Procedure(s) with comments: Bilateral Lumbar Three-Four Laminectomy (Bilateral) - bilateral  Patient Location: PACU  Anesthesia Type:General  Level of Consciousness: awake and alert   Airway and Oxygen Therapy: Patient Spontanous Breathing  Post-op Pain: mild  Post-op Assessment: Post-op Vital signs reviewed and Patient's Cardiovascular Status Stable  Post-op Vital Signs: stable  Last Vitals:  Filed Vitals:   12/11/14 1200  BP: 161/68  Pulse:   Temp:   Resp: 18    Complications: No apparent anesthesia complications

## 2014-12-11 NOTE — Op Note (Signed)
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  Surgeon: Kristeen Miss Asst. : Range Kritzer M.D. Anesthesia: Gen. Endotracheal   indications: the patient is a 69 year old individual who's had previous decompression at L4-L5 about 7 years ago. He is developed recurrent neurogenic claudication symptoms and back pain with radicular symptoms a bit worse on the right than on the left an MRI was performed a few months ago and this demonstrated the presence of significant spinal stenosis at L3-L4 patient was advised regarding surgery having failed efforts at conservative management. At the time of surgery was noted on the right side as the area was explored there was a free fragment of disc material that exceeded itself from the disc space at L3-4 this required surgical removal which was not initially planned based on the patient's preoperative films.    procedure: Patient was brought to the operating room supine on a stretcher. After the smooth induction of general endotracheal anesthesia, he was turned prone. The back was prepped with alcohol DuraPrep. A midline incision was created in the bed of the old incision. This was carried down to the lumbar dorsal fascia. A localizing radiograph is identified as having a marker at the L3-L4 laminar arch. The dissection was carried down on the right side first. Self-retaining retractor was placed in the wound. Laminotomy was created removing the inferior margin lamina of L3 L2 and including mesial wall the facet. A facetectomy was performed removing approximately one half of the facet including the superior articular process of L4 and the  inferior to radicular process of L3. The dissection identified the common dural tube as we went over the disc space there is noted to be a significant dorsally displaced mouth dissecting in this area identified a free fragment of disc. When this was removed it was evident that we right over the disc space was a substantial quantity of degenerated disc material that was protruding itself beyond the fragment was removed. After some further dissection were able to remove an empty the disc space of a substantial quantity of severely degenerated and desiccated disc material once this was removed the procedure continued to decompress the path of the L4 nerve root superiorly. This required the use of 2 and 3 and 4 mm curved rongeurs. As the dissection continued as noted that the common dural tube medially and the L4 nerve root inferiorly were well decompressed. Attention was then turned to the opposite side. A subperiosteal dissection was performed on the left side this was carried down to the laminar arch of L3. A laminotomy was created at L3-4 has it had been on the opposite side with a mesial facetectomy also being completed. The yellow ligament was lifted and removed and the common dural tube was explored. The take of the L4 nerve root was decompressed using the same series of straight and curved rongeurs and then the L4 nerve root was decompressed in a similar fashion. Here no evidence of a disc herniation was identified. With this hemostasis was achieved in the soft tissues. The wound was inspected again on the right side and no other fragments of disc or nerve root compromise was identified. When hemostasis was achieved there the retractor was removed the lumbar dorsal fascia was closed with #1 Vicryl  2-0 Vicryl was used in subcutaneous tissues and 3-0 Vicryl to close the subcutaneous take her skin. Prior to closing the subcutaneous tissues 15 mL of half percent Marcaine was injected into the paraspinous fascia.  Total blood loss for the procedure was estimated at 100 mL.

## 2014-12-11 NOTE — Progress Notes (Signed)
Patient ID: Charles Robinson, male   DOB: 01-Aug-1946, 69 y.o.   MRN: [id number] Vital signs are stable. Motor function is been good in lower extremities. Patient notes diminution leg pain particularly on the right side.  We will continue to observe him overnight if all is well will discharge in the morning.  Patient notes that he's had frequency of urination with small volumes of 100-200 mL. We'll scan his bladder to make sure that he is emptying adequately.

## 2014-12-11 NOTE — Transfer of Care (Signed)
Immediate Anesthesia Transfer of Care Note  Patient: Charles Robinson  Procedure(s) Performed: Procedure(s) with comments: Bilateral Lumbar Three-Four Laminectomy (Bilateral) - bilateral  Patient Location: PACU  Anesthesia Type:General  Level of Consciousness: awake, alert  and oriented  Airway & Oxygen Therapy: Patient Spontanous Breathing and Patient connected to face mask oxygen  Post-op Assessment: Report given to PACU RN, Post -op Vital signs reviewed and stable and Patient moving all extremities X 4  Post vital signs: Reviewed and stable  Complications: No apparent anesthesia complications

## 2014-12-11 NOTE — Evaluation (Addendum)
Occupational Therapy Evaluation Patient Details Name: Charles Robinson MRN: [id number] DOB: 1946/05/29 Today's Date: 12/11/2014    History of Present Illness 69 y.o. s/p Bilateral Lumbar Three-Four Laminectomy   Clinical Impression   Pt s/p above. Pt moving well. Education provided to pt and his spouse. Feel pt is safe to d/c home, from OT standpoint, with wife available to assist. OT signing off.     Follow Up Recommendations  No OT follow up;Supervision - Intermittent    Equipment Recommendations  None recommended by OT    Recommendations for Other Services       Precautions / Restrictions Precautions Precautions: Fall;Back Precaution Booklet Issued: Yes (comment) Precaution Comments: educated on back precautions Restrictions Weight Bearing Restrictions: No      Mobility Bed Mobility Overal bed mobility: Needs Assistance Bed Mobility: Rolling;Sidelying to Sit;Sit to Sidelying Rolling: Supervision Sidelying to sit: Supervision     Sit to sidelying: Supervision General bed mobility comments: cues for log roll technique.  Transfers Overall transfer level: Needs assistance   Transfers: Sit to/from Stand Sit to Stand: Min guard                   ADL Overall ADL's : Needs assistance/impaired     Grooming: Wash/dry face;Wash/dry hands;Set up;Supervision/safety;Standing               Lower Body Dressing: Set up;Supervision/safety;Sit to/from stand   Toilet Transfer: Supervision/safety;Min guard;Ambulation;Comfort height toilet   Toileting- Clothing Manipulation and Hygiene: Supervision/safety;Sit to/from stand       Functional mobility during ADLs: Supervision/safety;Min guard General ADL Comments: Educated on technique for LB ADLs and pt able to cross legs over knees. Educated that AE is available if this becomes an issue as well as explained toilet aide for hygiene if needed and explained what pt could use for toilet aide. Educated on use of cup  for oral care as well as placement of grooming items to avoid breaking precautions. Educated on safety such as safe shoewear. Educated on positioning of pillows. Recommended spouse be with him for shower transfer. Discussed incorporating back precautions into functional activities.  Explained uses of reacher. No LOB in session. Suggested holding onto stable part of door jam and/or wife when stepping in shower and wife be with him.     Vision                     Perception     Praxis      Pertinent Vitals/Pain Pain Assessment: 0-10 Pain Score: 1  Pain Location: back Pain Intervention(s): Monitored during session;Repositioned     Hand Dominance Right   Extremity/Trunk Assessment Upper Extremity Assessment Upper Extremity Assessment: Overall WFL for tasks assessed   Lower Extremity Assessment Lower Extremity Assessment: Overall WFL for tasks assessed       Communication Communication Communication: No difficulties   Cognition Arousal/Alertness: Awake/alert Behavior During Therapy: WFL for tasks assessed/performed Overall Cognitive Status: Within Functional Limits for tasks assessed                     General Comments       Exercises       Shoulder Instructions      Home Living Family/patient expects to be discharged to:: Private residence Living Arrangements: Spouse/significant other Available Help at Discharge: Family;Available 24 hours/day Type of Home: Other(Comment) (condo) Home Access: Stairs to enter Entrance Stairs-Number of Steps: 2 Entrance Stairs-Rails: Right Home Layout: Two level;Able to live on  main level with bedroom/bathroom     Bathroom Shower/Tub: Occupational psychologist: Standard     Home Equipment: Cane - single point;Bedside commode;Adaptive equipment;Shower Theme park manager: Reacher        Prior Functioning/Environment Level of Independence: Independent             OT Diagnosis: Acute pain    OT Problem List:     OT Treatment/Interventions:      OT Goals(Current goals can be found in the care plan section)    OT Frequency:     Barriers to D/C:            Co-evaluation              End of Session Equipment Utilized During Treatment: Gait belt Nurse Communication: Other (comment);Mobility status; pt urinated  Activity Tolerance: Patient tolerated treatment well Patient left: in bed;with call bell/phone within reach;with family/visitor present   Time: 0370-4888 OT Time Calculation (min): 31 min Charges:  OT General Charges $OT Visit: 1 Procedure OT Evaluation $Initial OT Evaluation Tier I: 1 Procedure OT Treatments $Self Care/Home Management : 8-22 mins G-CodesBenito Mccreedy OTR/L [phone] 12/11/2014, 6:10 PM

## 2014-12-11 NOTE — Anesthesia Preprocedure Evaluation (Addendum)
Anesthesia Evaluation  Patient identified by MRN, date of birth, ID band Patient awake    Reviewed: Allergy & Precautions, H&P , NPO status , Patient's Chart, lab work & pertinent test results  Airway Mallampati: II       Dental  (+) Teeth Intact, Dental Advidsory Given   Pulmonary  breath sounds clear to auscultation        Cardiovascular hypertension, + CAD Atrial Fibrillation Rhythm:Regular Rate:Normal + Systolic murmurs    Neuro/Psych    GI/Hepatic GERD-  Medicated and Controlled,  Endo/Other    Renal/GU      Musculoskeletal   Abdominal (+) + obese,   Peds  Hematology  (+) anemia ,   Anesthesia Other Findings   Reproductive/Obstetrics                            Anesthesia Physical Anesthesia Plan  ASA: III  Anesthesia Plan: General   Post-op Pain Management:    Induction: Intravenous  Airway Management Planned: Oral ETT  Additional Equipment:   Intra-op Plan:   Post-operative Plan: Extubation in OR  Informed Consent: I have reviewed the patients History and Physical, chart, labs and discussed the procedure including the risks, benefits and alternatives for the proposed anesthesia with the patient or authorized representative who has indicated his/her understanding and acceptance.   Dental Advisory Given and Dental advisory given  Plan Discussed with: Anesthesiologist, CRNA and Surgeon  Anesthesia Plan Comments:        Anesthesia Quick Evaluation

## 2014-12-11 NOTE — Progress Notes (Signed)
Utilization Review Completed.Donne Anon T1/19/2016

## 2014-12-11 NOTE — H&P (Signed)
CHIEF COMPLAINT:                                          Back pain, bilateral lower extremity pain for six months.  HISTORY OF PRESENT ILLNESS:                     Charles Robinson is a 69 year old, right-handed individual whom I had seen and treated years ago.  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  He recovered nicely.  He notes that here for the last six months he has had increasing back pain and bilateral lower extremity pain with significant limitation of his ability to walk any distance.  Despite efforts at some conservative management, he has not had any good response.  Feels that the stamina on his feet is decreasing significantly.  The pain has been getting considerably worse.  REVIEW OF SYSTEMS:                                    His system review s notable for the back pain as his only complaint on a 14-point review sheet in the office today.  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  . Current Medical Conditions:  He has some hypertension.  . Prior Operations:  Notes that he has not had any other surgery other than the back surgery.  . Medications:  Current medications include Coumadin 5 mg four times a week and 2.5 mg three times a week, finasteride 5 mg, doxazosin 4 mg, Crestor 20 mg, Niaspan, clopidogrel, amiodarone, metoprolol, fish oil, CoQ10, and multivitamin supplements.  SOCIAL HISTORY:                                            Personal history reveals that he does not smoke.  He does not use alcohol.   PHYSICAL EXAMINATION:                                He has had some height and weight that have been stable at 6 feet 3 inches, 260 pounds.  On physical examinaiton, I note that the patient stands straight and erect.  His motor function appears intact in the major groups including the iliopsoas, quad, tibialis anterior and the gastrocs.  His reflexes are absent in the patellae both  and absent in the Achilles both.  Sensation appears intact distally in the lower extremities.  IMAGING STUDIES:                                          Review of the studies that the patient had include an MRI of the lumbar spine.  This study demonstrates that he has significant stenosis at the level of L3-L4.  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  In addition, at L4-5 he had an extraforaminal disc protrusion on the left side.  L2-3 appears to have healed nicely with collapse of the disc space but maintenance of the central canal that is quite adequate.  L3-4 now has severe spondylitic stenosis secondary to a combination of facet hypertrophy and bulging disc.  The L4-5 level appears to be fairly stable.  I demonstrated the findings on the films.  I believe ultimately the degree of stenosis at L3-4 is what is aggravating the symptoms of back and leg pain for Mr. Dobratz.  IMPRESSION/PLAN:                                         We discussed consideration of conservative management, even possibly considering an epidural steroid injection given that his symptoms have been going on for nearly six months at least.  He has not had any response with the passage of time or any other conservative effort.  His limitations on using a nonsteroidal anti-inflammatory are quite severe.  I advised that he may be best served with bilateral decompression of the L3-4 level.  I believe that this surgery stands the most reasonable chance of giving him some relief of the worst of the symptoms.  Though will not fix all the arthritis in his back, it should allow his nerves in his legs to function better thereby giving him better stamina and hopefully less back pain and leg pain in the long run.  After some discussion of his options, he would like to proceed with surgical intervention.

## 2014-12-12 ENCOUNTER — Encounter (HOSPITAL_COMMUNITY): Payer: Self-pay | Admitting: Neurological Surgery

## 2014-12-12 MED ORDER — DEXAMETHASONE 1 MG PO TABS: ORAL_TABLET | ORAL | Status: DC

## 2014-12-12 MED ORDER — HYDROCODONE-ACETAMINOPHEN 5-325 MG PO TABS: 1.0000 | ORAL_TABLET | Freq: Four times a day (QID) | ORAL | Status: DC | PRN

## 2014-12-12 NOTE — Progress Notes (Signed)
PT Cancellation Note  Patient Details Name: Charles Robinson MRN: [id number] DOB: November 16, 1946   Cancelled Treatment:    Reason Eval/Treat Not Completed: PT screened, no needs identified, will sign off. OT saw pt and covered all education yesterday.  Reviewed back precaution handout.  No other needs.  Thanks.   [name] 12/12/2014, 9:18 AM [name] [phone] (pager)

## 2014-12-12 NOTE — Discharge Summary (Signed)
Physician Discharge Summary  Patient ID: Charles Robinson MRN: [id number] DOB/AGE: 1946/07/13 69 y.o.  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 Herniated nucleus pulposus L3-L4 right, urinary retention Active Problems:   Lumbar stenosis with neurogenic claudication   Discharged Condition: good  Hospital Course: Patient was admitted to undergo surgery which he tolerated well. He had good relief of his back and leg pain area he did have urinary retention which required placement of a Foley catheter overnight. He is voiding now.  Consults: None  Significant Diagnostic Studies: None  Treatments: surgery: Bilateral laminotomies and foraminotomies L3-L4 with discectomy L3-4 right  Discharge Exam: Blood pressure 127/60, pulse 72, temperature 98.4 F (36.9 C), temperature source Oral, resp. rate 18, height 6\' 3"  (1.905 m), weight 122.783 kg (270 lb 11 oz), SpO2 98 %. Incision is clean and dry motor function is intact in lower extremities station and gait are intact.  Disposition: 06-Home-Health Care Svc  Discharge Instructions    Call MD for:  redness, tenderness, or signs of infection (pain, swelling, redness, odor or green/yellow discharge around incision site)    Complete by:  As directed      Call MD for:  severe uncontrolled pain    Complete by:  As directed      Call MD for:  temperature >100.4    Complete by:  As directed      Diet - low sodium heart healthy    Complete by:  As directed      Increase activity slowly    Complete by:  As directed             Medication List    TAKE these medications        amiodarone 200 MG tablet  Commonly known as:  PACERONE  TAKE 1 TABLET EVERY DAY     clopidogrel 75 MG tablet  Commonly known as:  PLAVIX  TAKE 1  TABLET (75 MG TOTAL) BY MOUTH DAILY.     Coenzyme Q10 200 MG capsule  Take 200 mg by mouth daily.     doxazosin 8 MG tablet  Commonly known as:  CARDURA  Take 4 mg by mouth every evening.     finasteride 5 MG tablet  Commonly known as:  PROSCAR  Take 5 mg by mouth daily.     Fish Oil 1000 MG Caps  Take 1,000 capsules by mouth 2 (two) times daily.     FLUZONE HIGH-DOSE 0.5 ML Susy  Generic drug:  Influenza Vac Split High-Dose     HYDROcodone-acetaminophen 5-325 MG per tablet  Commonly known as:  NORCO/VICODIN  Take 1-2 tablets by mouth every 6 (six) hours as needed for moderate pain.     lisinopril 10 MG tablet  Commonly known as:  PRINIVIL,ZESTRIL  Take 1 tablet (10 mg total) by mouth daily.     methocarbamol 500 MG tablet  Commonly known as:  ROBAXIN  Take 1 tablet by mouth as needed for muscle spasms.     metoprolol succinate 25 MG 24 hr tablet  Commonly known as:  TOPROL-XL  Take 1 tablet (25 mg total) by mouth 2 (two) times daily.     multivitamin with minerals Tabs tablet  Take 1 tablet by mouth daily.     niacin 1000 MG CR tablet  Commonly known as:  NIASPAN  TAKE 2 TABLETS BY  MOUTH EVERY NIGHT AT BEDTIME     nitroGLYCERIN 0.4 MG SL tablet  Commonly known as:  NITROSTAT  Place 1 tablet (0.4 mg total) under the tongue every 5 (five) minutes as needed. For chest pain     rosuvastatin 20 MG tablet  Commonly known as:  CRESTOR  Take 1 tablet (20 mg total) by mouth daily.     warfarin 5 MG tablet  Commonly known as:  COUMADIN  Take 2.5-5 mg by mouth daily. Take 2.5mg  (1/2 a tablet) on MWF and 5mg  (whole tablet) on all every other days     warfarin 5 MG tablet  Commonly known as:  COUMADIN  TAKE 1 TABLET BY MOUTH DAILY OR AS DIRECTED BY COUMADIN CLINIC         Signed: Earleen Newport 12/12/2014, 9:20 AM

## 2014-12-13 NOTE — Progress Notes (Signed)
Pt and wife given D/C instructions with Rx's, verbal understanding was provided. Pt's incision is clean and dry, it is open to air. Pt's IV was removed prior to D/C. Pt D/C'd home via wheelchair @ 1045 per MD order. Pt is stable @ D/C and has no other needs at this time. Holli Humbles, RN

## 2014-12-24 ENCOUNTER — Ambulatory Visit: Payer: Medicare Other | Admitting: Pharmacist Clinician (PhC)/ Clinical Pharmacy Specialist

## 2015-01-02 ENCOUNTER — Ambulatory Visit (INDEPENDENT_AMBULATORY_CARE_PROVIDER_SITE_OTHER): Payer: Medicare Other | Admitting: Pharmacist Clinician (PhC)/ Clinical Pharmacy Specialist

## 2015-01-02 DIAGNOSIS — Z7901 Long term (current) use of anticoagulants: Secondary | ICD-10-CM

## 2015-01-02 DIAGNOSIS — I4891 Unspecified atrial fibrillation: Secondary | ICD-10-CM

## 2015-01-02 LAB — POCT INR: INR: 2.2

## 2015-01-08 ENCOUNTER — Telehealth: Payer: Self-pay | Admitting: Cardiology

## 2015-01-08 DIAGNOSIS — Z79899 Other long term (current) drug therapy: Secondary | ICD-10-CM

## 2015-01-08 DIAGNOSIS — I10 Essential (primary) hypertension: Secondary | ICD-10-CM

## 2015-01-08 NOTE — Telephone Encounter (Signed)
Charles Robinson is calling because he is having some bp issues and need to speak to someone . Please call  Thanks

## 2015-01-08 NOTE — Telephone Encounter (Signed)
Lets increase Lisinopril to 20 mg (as long as he is not taking NSAIDS for pain control).  Will need BMP early next week to reassess renal Fxn.  [name], MD

## 2015-01-08 NOTE — Telephone Encounter (Signed)
Returned call to patient he stated for the past week his B/P has been elevated.B/P ranging 160/110.Pulse 60.Stated he had back surgery 2 weeks ago and has been having back pain.Stated back pain better this week,but B/P has been elevated.Message sent to [place] for advice.

## 2015-01-09 MED ORDER — LISINOPRIL 20 MG PO TABS: 20.0000 mg | ORAL_TABLET | Freq: Every day | ORAL | Status: DC

## 2015-01-09 NOTE — Telephone Encounter (Signed)
Spoke to patient Information given Patient states blood pressure last evening was 140 /84. Patient states he is not taking any type NSAIDS ,ONLY ACETAMINOPHEN occasionally  patient is aware to increase lisinopril to 20mg  daily and labs next Thursday or Friday. E sent to new RX and lab to [facility]

## 2015-01-18 LAB — BASIC METABOLIC PANEL
BUN: 18 mg/dL (ref 6–23)
CO2: 29 mEq/L (ref 19–32)
Calcium: 9.1 mg/dL (ref 8.4–10.5)
Chloride: 103 mEq/L (ref 96–112)
Creat: 1.37 mg/dL — ABNORMAL HIGH (ref 0.50–1.35)
Glucose, Bld: 101 mg/dL — ABNORMAL HIGH (ref 70–99)
Potassium: 4.3 mEq/L (ref 3.5–5.3)
Sodium: 141 mEq/L (ref 135–145)

## 2015-01-24 ENCOUNTER — Telehealth: Payer: Self-pay | Admitting: *Deleted

## 2015-01-24 NOTE — Telephone Encounter (Signed)
-----   Message from [name], MD sent at 01/21/2015  7:05 PM EST ----- Kidney function looks stable to improved  [facility]

## 2015-01-24 NOTE — Telephone Encounter (Signed)
Spoke to patient. Result given . Verbalized understanding  

## 2015-01-24 NOTE — Telephone Encounter (Signed)
lmtcb- voicemail

## 2015-02-13 ENCOUNTER — Ambulatory Visit (INDEPENDENT_AMBULATORY_CARE_PROVIDER_SITE_OTHER): Payer: Medicare Other | Admitting: Pharmacist Clinician (PhC)/ Clinical Pharmacy Specialist

## 2015-02-13 DIAGNOSIS — Z7901 Long term (current) use of anticoagulants: Secondary | ICD-10-CM | POA: Diagnosis not present

## 2015-02-13 DIAGNOSIS — I4891 Unspecified atrial fibrillation: Secondary | ICD-10-CM

## 2015-02-13 LAB — POCT INR: INR: 2.1

## 2015-03-20 ENCOUNTER — Other Ambulatory Visit: Payer: Self-pay | Admitting: Cardiology

## 2015-03-20 NOTE — Telephone Encounter (Signed)
Rx(s) sent to pharmacy electronically.  

## 2015-03-22 ENCOUNTER — Other Ambulatory Visit (HOSPITAL_COMMUNITY): Payer: Self-pay | Admitting: Urology

## 2015-03-22 DIAGNOSIS — R972 Elevated prostate specific antigen [PSA]: Secondary | ICD-10-CM

## 2015-03-25 ENCOUNTER — Telehealth: Payer: Self-pay | Admitting: Cardiology

## 2015-03-25 MED ORDER — ROSUVASTATIN CALCIUM 20 MG PO TABS: 20.0000 mg | ORAL_TABLET | Freq: Every day | ORAL | Status: DC

## 2015-03-25 NOTE — Telephone Encounter (Signed)
Refill submitted to patient's preferred pharmacy.  

## 2015-03-25 NOTE — Telephone Encounter (Signed)
°  1. Which medications need to be refilled? Crestor 2. Which pharmacy is medication to be sent to?[facility] [phone] 3. Do they need a 30 day or 90 day supply? 90 and refills  4. Would they like a call back once the medication has been sent to the pharmacy? no

## 2015-03-27 ENCOUNTER — Ambulatory Visit (INDEPENDENT_AMBULATORY_CARE_PROVIDER_SITE_OTHER): Payer: Medicare Other | Admitting: Pharmacist Clinician (PhC)/ Clinical Pharmacy Specialist

## 2015-03-27 DIAGNOSIS — Z7901 Long term (current) use of anticoagulants: Secondary | ICD-10-CM | POA: Diagnosis not present

## 2015-03-27 DIAGNOSIS — I4891 Unspecified atrial fibrillation: Secondary | ICD-10-CM | POA: Diagnosis not present

## 2015-03-27 LAB — POCT INR: INR: 1.9

## 2015-04-04 ENCOUNTER — Encounter: Payer: Self-pay | Admitting: Cardiology

## 2015-04-09 ENCOUNTER — Ambulatory Visit (HOSPITAL_COMMUNITY)
Admission: RE | Admit: 2015-04-09 | Discharge: 2015-04-09 | Disposition: A | Discharge status: Home / Self Care | Payer: Medicare Other | Source: Ambulatory Visit | Attending: Urology | Admitting: Urology

## 2015-04-09 DIAGNOSIS — R972 Elevated prostate specific antigen [PSA]: Secondary | ICD-10-CM | POA: Diagnosis present

## 2015-04-09 LAB — POCT I-STAT CREATININE: Creatinine, Ser: 1.5 mg/dL — ABNORMAL HIGH (ref 0.61–1.24)

## 2015-04-09 MED ORDER — GADOBENATE DIMEGLUMINE 529 MG/ML IV SOLN
20.0000 mL | Freq: Once | INTRAVENOUS | Status: AC | PRN
  Administered 2015-04-09: 20 mL via INTRAVENOUS

## 2015-04-15 ENCOUNTER — Telehealth: Payer: Self-pay | Admitting: Cardiology

## 2015-04-15 NOTE — Telephone Encounter (Signed)
[facility] calling - Question r/t pt's prescribed meds clarified.

## 2015-05-06 ENCOUNTER — Other Ambulatory Visit: Payer: Self-pay | Admitting: Cardiology

## 2015-05-06 ENCOUNTER — Other Ambulatory Visit: Payer: Self-pay | Admitting: *Deleted

## 2015-05-06 MED ORDER — METOPROLOL SUCCINATE ER 25 MG PO TB24: 25.0000 mg | ORAL_TABLET | Freq: Two times a day (BID) | ORAL | Status: DC

## 2015-05-06 NOTE — Telephone Encounter (Signed)
REFILL 

## 2015-05-08 ENCOUNTER — Ambulatory Visit (INDEPENDENT_AMBULATORY_CARE_PROVIDER_SITE_OTHER): Payer: Medicare Other | Admitting: Pharmacist Clinician (PhC)/ Clinical Pharmacy Specialist

## 2015-05-08 DIAGNOSIS — Z7901 Long term (current) use of anticoagulants: Secondary | ICD-10-CM | POA: Diagnosis not present

## 2015-05-08 DIAGNOSIS — I4891 Unspecified atrial fibrillation: Secondary | ICD-10-CM | POA: Diagnosis not present

## 2015-05-08 LAB — POCT INR: INR: 1.8

## 2015-05-21 ENCOUNTER — Encounter: Payer: Self-pay | Admitting: Cardiology

## 2015-05-21 ENCOUNTER — Ambulatory Visit (INDEPENDENT_AMBULATORY_CARE_PROVIDER_SITE_OTHER): Payer: Medicare Other | Admitting: Cardiology

## 2015-05-21 ENCOUNTER — Ambulatory Visit (INDEPENDENT_AMBULATORY_CARE_PROVIDER_SITE_OTHER): Payer: Medicare Other | Admitting: Pharmacist Clinician (PhC)/ Clinical Pharmacy Specialist

## 2015-05-21 VITALS — BP 146/92 | HR 53 | Ht 75.0 in | Wt 262.9 lb

## 2015-05-21 DIAGNOSIS — I2510 Atherosclerotic heart disease of native coronary artery without angina pectoris: Secondary | ICD-10-CM

## 2015-05-21 DIAGNOSIS — Z79899 Other long term (current) drug therapy: Secondary | ICD-10-CM

## 2015-05-21 DIAGNOSIS — I10 Essential (primary) hypertension: Secondary | ICD-10-CM | POA: Diagnosis not present

## 2015-05-21 DIAGNOSIS — Z7901 Long term (current) use of anticoagulants: Secondary | ICD-10-CM

## 2015-05-21 DIAGNOSIS — E669 Obesity, unspecified: Secondary | ICD-10-CM

## 2015-05-21 DIAGNOSIS — I4891 Unspecified atrial fibrillation: Secondary | ICD-10-CM | POA: Diagnosis not present

## 2015-05-21 DIAGNOSIS — E785 Hyperlipidemia, unspecified: Secondary | ICD-10-CM

## 2015-05-21 DIAGNOSIS — I480 Paroxysmal atrial fibrillation: Secondary | ICD-10-CM

## 2015-05-21 LAB — POCT INR: INR: 2.3

## 2015-05-21 MED ORDER — ROSUVASTATIN CALCIUM 40 MG PO TABS: 40.0000 mg | ORAL_TABLET | Freq: Every day | ORAL | Status: DC

## 2015-05-21 NOTE — Progress Notes (Signed)
PCP: Charles Redwood, MD  Clinic Note: Chief Complaint  Patient presents with  . Follow-up    [id number]; no chest pain, no shortness of breath, no edema, no pain in legs, no cramping in legs, no lightheadedness, no dizziness  . Coronary Artery Disease    Extensive PCI to LAD and circumflex-OM  . Atrial Fibrillation    HPI: Charles NICKSONis a 69y.o. male with a PMH below who presents today for 6 months f/u - CAD/PAF. Last seen by Charles Robinson December. No issues.  Was Pre-op for Back Sgx (Dr. EEllene Robinson.   He is a very pleasant gentleman who was a former patient of Dr. AAldona Robinson   His CAD history dates back to 2003. He has had PCI to LAD.   I met him in September 2012 after an abnormal Myoview showed ischemia in the OM distribution which was the site of a recently placed stent by Dr. AAldona Robinson    I performed wheat ended up being a very complicated procedure with extensive dissection of the OM and circumflex requiring multiple stents;  He returned about a year later again with positive stress test in the same distribution was found to have H. short gap between stent to the OM that was stented.   He had a Myoview done last January of 2014 that was negative for ischemia.   He has had paroxysmal A. Fib (first noted at the time of his complex PCI thousand 12), it was quite symptomatic and was started on amiodarone. As far as I can tell he has not had any recurrences.   Past Medical History  Diagnosis Date  . History of colon polyps   . Hyperlipidemia   . Hypertension   . BPH (benign prostatic hypertrophy)   . Cancer     SKIN CA  . Paroxysmal atrial fibrillation     followed by Dr. DGlenetta Robinson s/p DCCV 2011, 07/2011 post PCI with Type 4a MI  . Rupture quadriceps tendon   . Coronary artery disease 03/15/2012    s/p PCI to LAD, to RCA, then extensive PCI to LCx-OM1  (enitre prox-AVG Circ for dissection in 07/2011)  . Chest pain 08/16/2011    Echo - EF 60-65%;  moderate LV concentric hypertrophy; abdnormal LV relaxation (grade 1 diastolic dysfunction; ascending aorta mildly dilated; LA moderately dilated;   . Atrial flutter 08/20/2011    TEE- atrial septum - no defect identified; RA normal in size, no evidence of thrombus; ascending aorta normal  . PAF (paroxysmal atrial fibrillation) 12/20/2012    R/Charles CL - EF 61%; normal EKG, no motion artifact, no evidence of ischemia, normal BP response, low risk stress nuclear study  . Bruit 08/01/2009    doppler - no suggestion of significant diameter reduction, dissection or aneurysmal dilation.  normal evaluation  . GERD (gastroesophageal reflux disease)   . Arthritis     back    Prior Cardiac Evaluation and Past Surgical History: Past Surgical History  Procedure Laterality Date  . Coronary stent placement  2003 - 2011 - 2012 - 2013    x 8 stents (per pt)  . Coronary angioplasty with stent placement  03/15/2012    [place]    . Cardiac catheterization  03/15/2012    patent LAD stents, patent LCx stents w/80% focal ISR just distal to OM; OM proximal stent open; 2 site PTCA w/ PTCA and stenting of  OM2 ang PTCA of distal stent ISR followed by PTCA of mid LCx ISR  . Quadriceps tendon repair  09/19/2012    Procedure: REPAIR QUADRICEP TENDON;  Surgeon: [name], MD;  Location: [facility];  Service: Orthopedics;  Laterality: Left;  . Nm myoview ltd  11/2012    No ischemia or infarction, normal EF & WM  . Doppler echocardiography  07/2011    EF ~60-65%, Grade 1 D Dysfxn; mod Conc LVH  . Left heart catheterization with coronary angiogram N/A 03/15/2012    Procedure: LEFT HEART CATHETERIZATION WITH CORONARY ANGIOGRAM;  Surgeon: Charles Man, MD;  Location: [facility] CATH LAB;  Service: Cardiovascular;  Laterality: N/A;  . Lumbar laminectomy/decompression microdiscectomy Bilateral 12/11/2014    Procedure: Bilateral Lumbar Three-Four Laminectomy;  Surgeon: Charles Miss, MD;   Location: [facility]URO ORS;  Service: Neurosurgery;  Laterality: Bilateral;  bilateral    Interval History: Charles Robinson is doing very well today. He is walking 2-3 miles a day, and has adjusted his dietary intake. He essentially has been eating smaller helpings without snacks. He is able to go up steps and heels without any difficulty. He denies any anginal chest tightness or pressure with rest or exertion. No sensation of palpitations or irregular/rapid heartbeats. His only mild "flip-flopping" .  Not had any bleeding complications on anticoagulation. No myalgias on statins.   he recovered from his back surgery really well. He said the surgery itself went outstandingly well as did the recovery. He is now able to get back into his walking routine.  Cardiac review of systems:  No chest pain or shortness of breath with rest or exertion. No PND, orthopnea or edema. No palpitations, lightheadedness, dizziness, weakness or syncope/near syncope. No TIA/amaurosis fugax symptoms. No melena, hematochezia, hematuria, or epstaxis. No claudication.  ROS: A comprehensive was performed. Review of Systems  Constitutional: Positive for weight loss (He is deathly cut down on his helping sizes, and increase his exercise level). Negative for malaise/fatigue.  Musculoskeletal:       Usual age-related arthritis pains  Neurological: Negative for dizziness and headaches.  Endo/Heme/Allergies: Bruises/bleeds easily.  Psychiatric/Behavioral: Negative for depression.  All other systems reviewed and are negative.   Current Outpatient Prescriptions on File Prior to Visit  Medication Sig Dispense Refill  . amiodarone (PACERONE) 200 MG tablet TAKE 1 TABLET EVERY DAY (Patient taking differently: Take 200 mg by mouth daily. ) 90 tablet 2  . clopidogrel (PLAVIX) 75 MG tablet TAKE 1 TABLET (75 MG TOTAL) BY MOUTH DAILY. (Patient taking differently: Take 75 mg by mouth daily. TAKE 1 TABLET (75 MG TOTAL) BY MOUTH DAILY.) 90 tablet 2  .  Coenzyme Q10 200 MG capsule Take 200 mg by mouth daily.    [name] doxazosin (CARDURA) 8 MG tablet Take 4 mg by mouth every evening.     . finasteride (PROSCAR) 5 MG tablet Take 5 mg by mouth daily.    [name] lisinopril (PRINIVIL,ZESTRIL) 20 MG tablet Take 1 tablet (20 mg total) by mouth daily. 30 tablet 11  . metoprolol succinate (TOPROL-XL) 25 MG 24 hr tablet Take 1 tablet (25 mg total) by mouth 2 (two) times daily. 180 tablet 3  . Multiple Vitamin (MULTIVITAMIN WITH MINERALS) TABS tablet Take 1 tablet by mouth daily.    . niacin (NIASPAN) 1000 MG CR tablet TAKE 2 TABLETS BY MOUTH EVERY NIGHT AT BEDTIME 60 tablet 0  . nitroGLYCERIN (NITROSTAT) 0.4 MG SL tablet Place 1 tablet (0.4 mg total) under the tongue every 5 (  five) minutes as needed. For chest pain (Patient taking differently: Place 0.4 mg under the tongue every 5 (five) minutes as needed for chest pain. ) 25 tablet 2  . Omega-3 Fatty Acids (FISH OIL) 1000 MG CAPS Take 1,000 capsules by mouth 2 (two) times daily.     [name] warfarin (COUMADIN) 5 MG tablet Take 2.5-5 mg by mouth daily. Take 2.42m (1/2 a tablet) on MWF and 555m(whole tablet) on all every other days     No current facility-administered medications on file prior to visit.   Allergies  Allergen Reactions  . Lipitor [Atorvastatin] Other (See Comments)    Increased HR is currently tolerating --   . Morphine And Related Nausea And Vomiting  . Vicodin [Hydrocodone-Acetaminophen] Other (See Comments)    Feel crazy     History  Substance Use Topics  . Smoking status: Never Smoker   . Smokeless tobacco: Never Used  . Alcohol Use: 1.2 oz/week    2 Cans of beer per week     Comment: rare   family history is not on file. no reported premature cardiac history in his family.   Wt Readings from Last 3 Encounters:  05/21/15 119.251 kg (262 lb 14.4 oz)  12/11/14 122.783 kg (270 lb 11 oz)  12/05/14 122.789 kg (270 lb 11.2 oz)  -- cut down on how much & what he eats   PHYSICAL EXAM BP  146/92 mmHg  Pulse 53  Ht '6\' 3"'  (1.905 m)  Wt 119.251 kg (262 lb 14.4 oz)  BMI 32.86 kg/m2 General appearance: alert, cooperative, appears stated age, no distress, mildly obese and Very pleasant mood and affect. His weight is down 15 pounds from his last visit weight of 261 pounds with a BMI of 30.75 today  HEENT: [place]/AP, EOMI, MMM, anicteric sclera Neck: no adenopathy, no carotid bruit, no JVD, supple, symmetrical, trachea midline and thyroid not enlarged, symmetric, no tenderness/mass/nodules  Lungs: CTAB normal percussion bilaterally and Nonlabored. Normal expansion  Heart: RRR, S1, S2 normal, no murmur, click, rub or gallop and normal apical impulse  Abdomen: soft, non-tender; bowel sounds normal; no masses, no organomegaly and Mildly obese  Extremities: extremities normal, atraumatic, no cyanosis or edema; Pulses: 2+ and symmetric  Neurologic: A&oX 3, normal strength and tone. Normal symmetric reflexes. Normal coordination and gait    Adult ECG Report  Rate: 53 ;  Rhythm: sinus bradycardia and Otherwise normal EKG with normal axis, intervals and durations. PR interval is 202  Narrative Interpretation: Stable, normal EKG besides sinus bradycardia  Other studies Reviewed: Additional studies/ records that were reviewed today include:  n/a Review of the above records demonstrates:  Recent Labs: 03[facility]22/2015 [facility]  Na+ 141, K+ 4.3, Cl- 107, HCO3- 28 , BUN 19, Cr 1.4, Glu 94, Ca2+ 19.0; AST 28, ALT 21; AlkP 66, Alb 3.8, TP 6.4, T Bili 1.6  CBC: W 4.2, H/H 14.9/42.8, Plt 140  TC 156, TG 73, HDL 49, LDL 92 (not quite at goal) - relatively stable   ASSESSMENT / PLAN: Problem List Items Addressed This Visit    CAD- last PCI was April 2013- OM/CFX  PCI for ISR (Chronic)    Extensive PCI in the past of the LAD and RCA but then most notably to the OM/circumflex. Based on the amount of stent that he has, I think he needs to be on Plavix essentially  lifelong. Not on aspirin because he is on anticoagulation for A. fib. He is  on ACE inhibitor and statin along with a beta blocker and amiodarone. No active anginal symptoms with negative Myoview stress test in January 2014. The extent of his stent load and the fact that he sometimes does not have symptoms despite having severe CAD would warrant intermittent screening stress test. Would probably wait until 2018.      Relevant Medications   rosuvastatin (CRESTOR) 40 MG tablet   Other Relevant Orders   EKG 12-Lead (Completed)   Comprehensive metabolic panel   Lipid panel   Protime-INR   Dyslipidemia - Primary (Chronic)    Overall total cholesterol and HDL looks good. His LDL is at 92 and stable, but not quite as good as I like it to be.  Plan: Increase Crestor to 40 mg daily. Continue Niaspan and fish oil, but may need to consider Zetia to achieve goal LDL less than 70       Relevant Medications   rosuvastatin (CRESTOR) 40 MG tablet   Other Relevant Orders   EKG 12-Lead (Completed)   Comprehensive metabolic panel   Lipid panel   Protime-INR   Essential hypertension (Chronic)    He continues to be borderline controlled. Cannot increase his beta blocker dose. Not really any benefit from increasing his lisinopril. Next up would probably be to consider adding a diuretic of some sort such as chlorthalidone or HCTZ. That should probably allow for enough reduction. He says his pressures are usually better at home, so we will simply continue to monitor and perhaps check his blood pressures when he comes for his INR checks.      Relevant Medications   rosuvastatin (CRESTOR) 40 MG tablet   Other Relevant Orders   EKG 12-Lead (Completed)   Comprehensive metabolic panel   Lipid panel   Protime-INR   Long term current use of anticoagulant therapy (Chronic)   Relevant Medications   rosuvastatin (CRESTOR) 40 MG tablet   Other Relevant Orders   EKG 12-Lead (Completed)   Comprehensive metabolic  panel   Lipid panel   Protime-INR   Obesity (BMI 30-39.9) (Chronic)    He's done well with an 8 pound weight loss in the last 5 months. I congratulated his efforts area he continues to work on his diet and increasing his exercise.      On amiodarone therapy (Chronic)   Relevant Medications   rosuvastatin (CRESTOR) 40 MG tablet   Other Relevant Orders   EKG 12-Lead (Completed)   Comprehensive metabolic panel   Lipid panel   Protime-INR   PAF- NSR on Amiodarone (Chronic)    Likely this had to do with his post-PCI related MI. As far as I can tell he has not had any further recurrences. Simply because of his severe symptoms, he is on amiodarone. He should have his eyes checked annually which we discussed. His thyroid panel was normal by recent check of TSH of 2.951 with normal LFTs. Next year he should have a follow-up PFT level.      Relevant Medications   rosuvastatin (CRESTOR) 40 MG tablet    Other Visit Diagnoses    Encounter for long-term (current) use of medications        Relevant Medications    rosuvastatin (CRESTOR) 40 MG tablet    Other Relevant Orders    EKG 12-Lead (Completed)    Comprehensive metabolic panel    Lipid panel    Protime-INR       Current medicines are reviewed at length with the patient today. (+/- concerns)  duration of treatment for Plavix -- see above. Likely lifelong The following changes have been made: Increase Crestor from 20 mg 40 mg. Recheck lipid panel in 3 months. labs/ tests ordered today include:   Orders Placed This Encounter  Procedures  . Comprehensive metabolic panel    Standing Status: Future     Number of Occurrences:      Standing Expiration Date: 05/20/2016    Order Specific Question:  Has the patient fasted?    Answer:  Yes  . Lipid panel    Standing Status: Future     Number of Occurrences:      Standing Expiration Date: 05/20/2016    Order Specific Question:  Has the patient fasted?    Answer:  Yes  . Protime-INR  .  EKG 12-Lead   Meds ordered this encounter  Medications  . rosuvastatin (CRESTOR) 40 MG tablet    Sig: Take 1 tablet (40 mg total) by mouth daily.    Dispense:  90 tablet    Refill:  3     Followup: Six-month follow-up with Kerin Ransom, PA and then one year follow-up with myself.    Charles Robinson, M.D., M.S. Interventional Cardiologist   Pager # [phone]

## 2015-05-21 NOTE — Patient Instructions (Addendum)
INCREASE CRESTOR 40 MG  FROM 20 MG.     IN 3 MONTHS HAVE LABS DONE -CMP , LIPIDS-DO NOT EAT OR DRINK THE MORNING OF THE TEST- WILL MAIL YOU LAB SLIPS IN 2 AND 1/2 MONTHS TO YOU  Your physician recommends that you schedule a follow-up appointment in 6 month Kerin Ransom PA   Your physician wants you to follow-up in [place]. ---30 MIN APPOINTMENT.  You will receive a reminder letter in the mail two months in advance. If you don't receive a letter, please call our office to schedule the follow-up appointment.

## 2015-05-22 ENCOUNTER — Encounter: Payer: Self-pay | Admitting: Cardiology

## 2015-05-22 NOTE — Assessment & Plan Note (Signed)
Extensive PCI in the past of the LAD and RCA but then most notably to the OM/circumflex. Based on the amount of stent that he has, I think he needs to be on Plavix essentially lifelong. Not on aspirin because he is on anticoagulation for A. fib. He is on ACE inhibitor and statin along with a beta blocker and amiodarone. No active anginal symptoms with negative Myoview stress test in January 2014. The extent of his stent load and the fact that he sometimes does not have symptoms despite having severe CAD would warrant intermittent screening stress test. Would probably wait until 2018.

## 2015-05-22 NOTE — Assessment & Plan Note (Signed)
Likely this had to do with his post-PCI related MI. As far as I can tell he has not had any further recurrences. Simply because of his severe symptoms, he is on amiodarone. He should have his eyes checked annually which we discussed. His thyroid panel was normal by recent check of TSH of 2.951 with normal LFTs. Next year he should have a follow-up PFT level.

## 2015-05-22 NOTE — Assessment & Plan Note (Signed)
He continues to be borderline controlled. Cannot increase his beta blocker dose. Not really any benefit from increasing his lisinopril. Next up would probably be to consider adding a diuretic of some sort such as chlorthalidone or HCTZ. That should probably allow for enough reduction. He says his pressures are usually better at home, so we will simply continue to monitor and perhaps check his blood pressures when he comes for his INR checks.

## 2015-05-22 NOTE — Assessment & Plan Note (Signed)
Overall total cholesterol and HDL looks good. His LDL is at 92 and stable, but not quite as good as I like it to be.  Plan: Increase Crestor to 40 mg daily. Continue Niaspan and fish oil, but may need to consider Zetia to achieve goal LDL less than 70

## 2015-05-22 NOTE — Assessment & Plan Note (Signed)
He's done well with an 8 pound weight loss in the last 5 months. I congratulated his efforts area he continues to work on his diet and increasing his exercise.

## 2015-06-07 ENCOUNTER — Other Ambulatory Visit: Payer: Self-pay | Admitting: Cardiology

## 2015-06-10 ENCOUNTER — Other Ambulatory Visit: Payer: Self-pay | Admitting: Cardiology

## 2015-06-10 ENCOUNTER — Ambulatory Visit: Payer: BLUE CROSS/BLUE SHIELD | Admitting: Pharmacist Clinician (PhC)/ Clinical Pharmacy Specialist

## 2015-06-10 MED ORDER — METOPROLOL SUCCINATE ER 25 MG PO TB24: 25.0000 mg | ORAL_TABLET | Freq: Two times a day (BID) | ORAL | Status: DC

## 2015-06-10 MED ORDER — NIACIN ER (ANTIHYPERLIPIDEMIC) 1000 MG PO TBCR: 2000.0000 mg | EXTENDED_RELEASE_TABLET | Freq: Every day | ORAL | Status: DC

## 2015-06-10 NOTE — Telephone Encounter (Signed)
Refill submitted to patient's preferred pharmacy. Informed patient. Pt voiced understanding, no other stated concerns at this time.  

## 2015-06-10 NOTE — Telephone Encounter (Signed)
°  1. Which medications need to be refilled? Niacin and Metoprolol  2. Which pharmacy [phone] 3. Do they need a 30 day or 90 day supply? 30 for Niacin and 90 on Metoprolol and refills 4.Would they like a call back once the medication has been sent to the pharmacy? yes

## 2015-06-11 LAB — PROTIME-INR: INR: 2.7 — AB (ref <=1.1)

## 2015-06-13 ENCOUNTER — Ambulatory Visit (INDEPENDENT_AMBULATORY_CARE_PROVIDER_SITE_OTHER): Payer: BLUE CROSS/BLUE SHIELD | Admitting: Pharmacist Clinician (PhC)/ Clinical Pharmacy Specialist

## 2015-06-13 DIAGNOSIS — Z7901 Long term (current) use of anticoagulants: Secondary | ICD-10-CM

## 2015-06-13 DIAGNOSIS — I480 Paroxysmal atrial fibrillation: Secondary | ICD-10-CM

## 2015-07-02 ENCOUNTER — Other Ambulatory Visit: Payer: Self-pay | Admitting: Cardiology

## 2015-07-02 NOTE — Telephone Encounter (Signed)
Rx(s) sent to pharmacy electronically.  

## 2015-07-08 ENCOUNTER — Ambulatory Visit (INDEPENDENT_AMBULATORY_CARE_PROVIDER_SITE_OTHER): Payer: Medicare Other | Admitting: Pharmacist Clinician (PhC)/ Clinical Pharmacy Specialist

## 2015-07-08 ENCOUNTER — Ambulatory Visit: Payer: BLUE CROSS/BLUE SHIELD | Admitting: Pharmacist Clinician (PhC)/ Clinical Pharmacy Specialist

## 2015-07-08 DIAGNOSIS — I4891 Unspecified atrial fibrillation: Secondary | ICD-10-CM | POA: Diagnosis not present

## 2015-07-08 DIAGNOSIS — Z7901 Long term (current) use of anticoagulants: Secondary | ICD-10-CM

## 2015-07-08 LAB — POCT INR: INR: 2.4

## 2015-07-13 ENCOUNTER — Other Ambulatory Visit: Payer: Self-pay | Admitting: Cardiology

## 2015-07-15 ENCOUNTER — Telehealth: Payer: Self-pay | Admitting: Cardiology

## 2015-07-15 NOTE — Telephone Encounter (Signed)
Lattie Haw from [facility] [phone]  I calling in a medication interaction  Has a prescription for coumadin for patient  A prescription for Plavix as filled earlier in the month  Please advise

## 2015-07-15 NOTE — Telephone Encounter (Signed)
Called pharmacy and spoke with Lattie Haw, informed her okay for patient to be on both medications.

## 2015-07-15 NOTE — Telephone Encounter (Signed)
Lattie Haw is calling in to report a drug interaction between the pt's Coumadin and Plavix. Please call  Thanks

## 2015-07-16 ENCOUNTER — Telehealth: Payer: Self-pay | Admitting: Cardiology

## 2015-07-16 MED ORDER — WARFARIN SODIUM 5 MG PO TABS: ORAL_TABLET | ORAL | Status: DC

## 2015-07-16 NOTE — Telephone Encounter (Signed)
°  1. Which medications need to be refilled? Warfarin  2. Which [phone]  3. Do they need a 30 day or 90 day supply? 90 and refills  4. Would they like a call back once the medication has been sent to the pharmacy? yes

## 2015-07-16 NOTE — Telephone Encounter (Signed)
He has been on these meds for a long time.   Warfarin for Afib & Plavix for lots of stents.  Interaction = increased bleed risk.  Unfortunately, we sort of need to weigh risk/benefits.  Charles Robinson

## 2015-07-18 ENCOUNTER — Telehealth: Payer: Self-pay | Admitting: *Deleted

## 2015-07-18 DIAGNOSIS — Z7901 Long term (current) use of anticoagulants: Secondary | ICD-10-CM

## 2015-07-18 DIAGNOSIS — E785 Hyperlipidemia, unspecified: Secondary | ICD-10-CM

## 2015-07-18 DIAGNOSIS — I10 Essential (primary) hypertension: Secondary | ICD-10-CM

## 2015-07-18 DIAGNOSIS — Z79899 Other long term (current) drug therapy: Secondary | ICD-10-CM

## 2015-07-18 DIAGNOSIS — I2510 Atherosclerotic heart disease of native coronary artery without angina pectoris: Secondary | ICD-10-CM

## 2015-07-18 NOTE — Telephone Encounter (Signed)
-----   Message from Raiford Simmonds, RN sent at 05/21/2015 10:12 AM EDT ----- LABS BY SEPT 28 20I6  MAIL IN AUG 2016---LIPID ,CMP

## 2015-08-03 LAB — COMPREHENSIVE METABOLIC PANEL
ALT: 27 U/L (ref 9–46)
AST: 29 U/L (ref 10–35)
Albumin: 4 g/dL (ref 3.6–5.1)
Alkaline Phosphatase: 62 U/L (ref 40–115)
BUN: 21 mg/dL (ref 7–25)
CO2: 26 mmol/L (ref 20–31)
Calcium: 8.9 mg/dL (ref 8.6–10.3)
Chloride: 105 mmol/L (ref 98–110)
Creat: 1.39 mg/dL — ABNORMAL HIGH (ref 0.70–1.18)
Glucose, Bld: 86 mg/dL (ref 65–99)
Potassium: 4.4 mmol/L (ref 3.5–5.3)
Sodium: 139 mmol/L (ref 135–146)
Total Bilirubin: 1 mg/dL (ref 0.2–1.2)
Total Protein: 6.5 g/dL (ref 6.1–8.1)

## 2015-08-03 LAB — LIPID PANEL
Cholesterol: 145 mg/dL (ref 125–200)
HDL: 62 mg/dL (ref >=40)
LDL Cholesterol: 72 mg/dL (ref <130)
Total CHOL/HDL Ratio: 2.3 Ratio (ref <=5.0)
Triglycerides: 53 mg/dL (ref <150)
VLDL: 11 mg/dL (ref <30)

## 2015-08-08 ENCOUNTER — Telehealth: Payer: Self-pay | Admitting: *Deleted

## 2015-08-08 ENCOUNTER — Other Ambulatory Visit: Payer: Self-pay | Admitting: Cardiology

## 2015-08-08 NOTE — Telephone Encounter (Signed)
-----   Message from [name], MD sent at 08/07/2015 10:56 AM EDT ----- Overall labs look improved. Creatinine is more stable (more like it was 6-8 months ago). Otherwise liver function and chemistries look good Lipid panel continues to look better. I think 8 months ago was an anomaly. Currently it is much more like it was 2 years ago, essentially pretty much at goal. For our next check we would probably want to consider an advanced lipid panel to confirm that are calculated LDL correlates with a measured LDL particle number.  [name], MD  Reason for the patient's PCP is Dr. [name] [facility] [place]) Brigitte Pulse

## 2015-08-08 NOTE — Telephone Encounter (Signed)
Left message to call back  

## 2015-08-08 NOTE — Telephone Encounter (Signed)
Routed report to Dr Brigitte Pulse

## 2015-08-09 ENCOUNTER — Telehealth: Payer: Self-pay | Admitting: Cardiology

## 2015-08-09 NOTE — Telephone Encounter (Signed)
°  1. Which medications need to be refilled? Amiodarone   2. Which pharmacy [facility] [address]   3. Do they need a 30 day or 90 day supply? 90  4. Would they like a call back once the medication has been sent to the pharmacy? Yes

## 2015-08-09 NOTE — Telephone Encounter (Signed)
Returning your call. °

## 2015-08-09 NOTE — Telephone Encounter (Signed)
Spoke to patient. Result given . Verbalized understanding  

## 2015-08-09 NOTE — Telephone Encounter (Signed)
REFILL 

## 2015-08-19 ENCOUNTER — Ambulatory Visit: Payer: BLUE CROSS/BLUE SHIELD | Admitting: Pharmacist Clinician (PhC)/ Clinical Pharmacy Specialist

## 2015-08-23 ENCOUNTER — Ambulatory Visit (INDEPENDENT_AMBULATORY_CARE_PROVIDER_SITE_OTHER): Payer: Medicare Other | Admitting: Pharmacist Clinician (PhC)/ Clinical Pharmacy Specialist

## 2015-08-23 DIAGNOSIS — Z7901 Long term (current) use of anticoagulants: Secondary | ICD-10-CM

## 2015-08-23 DIAGNOSIS — I4891 Unspecified atrial fibrillation: Secondary | ICD-10-CM | POA: Diagnosis not present

## 2015-08-23 LAB — POCT INR: INR: 2.2

## 2015-10-07 ENCOUNTER — Ambulatory Visit: Payer: BLUE CROSS/BLUE SHIELD | Admitting: Pharmacist Clinician (PhC)/ Clinical Pharmacy Specialist

## 2015-10-09 ENCOUNTER — Ambulatory Visit (INDEPENDENT_AMBULATORY_CARE_PROVIDER_SITE_OTHER): Payer: Medicare Other | Admitting: Pharmacist Clinician (PhC)/ Clinical Pharmacy Specialist

## 2015-10-09 DIAGNOSIS — I4891 Unspecified atrial fibrillation: Secondary | ICD-10-CM

## 2015-10-09 DIAGNOSIS — Z7901 Long term (current) use of anticoagulants: Secondary | ICD-10-CM

## 2015-10-09 LAB — POCT INR: INR: 2.4

## 2015-11-07 ENCOUNTER — Other Ambulatory Visit: Payer: Self-pay | Admitting: Cardiology

## 2015-11-07 NOTE — Telephone Encounter (Signed)
SPOKE WITH PHARMACIST 90 TABS ADMINISTERED IN November 2016. RX REFILL NOT DUE

## 2015-11-19 ENCOUNTER — Ambulatory Visit (INDEPENDENT_AMBULATORY_CARE_PROVIDER_SITE_OTHER): Payer: Medicare Other | Admitting: Pharmacist Clinician (PhC)/ Clinical Pharmacy Specialist

## 2015-11-19 ENCOUNTER — Encounter: Payer: Self-pay | Admitting: Cardiology

## 2015-11-19 ENCOUNTER — Ambulatory Visit (INDEPENDENT_AMBULATORY_CARE_PROVIDER_SITE_OTHER): Payer: Medicare Other | Admitting: Cardiology

## 2015-11-19 VITALS — BP 140/86 | HR 53 | Ht 75.0 in | Wt 266.1 lb

## 2015-11-19 DIAGNOSIS — E785 Hyperlipidemia, unspecified: Secondary | ICD-10-CM | POA: Diagnosis not present

## 2015-11-19 DIAGNOSIS — Z7901 Long term (current) use of anticoagulants: Secondary | ICD-10-CM

## 2015-11-19 DIAGNOSIS — N183 Chronic kidney disease, stage 3 unspecified: Secondary | ICD-10-CM

## 2015-11-19 DIAGNOSIS — I10 Essential (primary) hypertension: Secondary | ICD-10-CM | POA: Diagnosis not present

## 2015-11-19 DIAGNOSIS — I4891 Unspecified atrial fibrillation: Secondary | ICD-10-CM

## 2015-11-19 DIAGNOSIS — R0989 Other specified symptoms and signs involving the circulatory and respiratory systems: Secondary | ICD-10-CM

## 2015-11-19 DIAGNOSIS — N189 Chronic kidney disease, unspecified: Secondary | ICD-10-CM

## 2015-11-19 DIAGNOSIS — I2510 Atherosclerotic heart disease of native coronary artery without angina pectoris: Secondary | ICD-10-CM

## 2015-11-19 DIAGNOSIS — I480 Paroxysmal atrial fibrillation: Secondary | ICD-10-CM

## 2015-11-19 DIAGNOSIS — Z9861 Coronary angioplasty status: Secondary | ICD-10-CM

## 2015-11-19 LAB — POCT INR: INR: 2.7

## 2015-11-19 NOTE — Assessment & Plan Note (Signed)
s/p PCI to LAD, to RCA, then extensive PCI to LCx-OM1  (enitre prox-AVG Circ for dissection in 07/2011) Most recent Myoview: January 2014 low risk

## 2015-11-19 NOTE — Assessment & Plan Note (Signed)
GFR 47

## 2015-11-19 NOTE — Patient Instructions (Signed)
Medication Instructions:  Please continue your current medications  Labwork: NONE  Testing/Procedures: 1. Carotid Doppler - Your physician has requested that you have a carotid duplex. This test is an ultrasound of the carotid arteries in your neck. It looks at blood flow through these arteries that supply the brain with blood. Allow one hour for this exam. There are no restrictions or special instructions.  Follow-Up: Kerin Ransom, [place], recommends that you schedule a follow-up appointment in 6 months with Dr Ellyn Hack. You will receive a reminder letter in the mail two months in advance. If you don't receive a letter, please call our office to schedule the follow-up appointment.  If you need a refill on your cardiac medications before your next appointment, please call your pharmacy.

## 2015-11-19 NOTE — Assessment & Plan Note (Addendum)
B/P a little elevated today. He will monitor this.

## 2015-11-19 NOTE — Assessment & Plan Note (Signed)
Excellent control- LDL 72 Sept 2016

## 2015-11-19 NOTE — Assessment & Plan Note (Signed)
Asymptomatic. 

## 2015-11-19 NOTE — Progress Notes (Signed)
11/19/2015 Charles Robinson   [birth year]  [id number]  Primary Physician [name], MD Primary Cardiologist: Dr Ellyn Hack  HPI:  69 y/o previously followed by Dr Rex Kras, now followed by Dr Ellyn Hack. He has a history of CAD, he had an LAD stent in 2004, and a CFX/OM PCI June 2012.  In September 2012 he was restudied for angina and had OM/CFX disease treated with PCI that was complicated by dissection. Dr Ellyn Hack ultimately got a good result. He was seen again in April 2013 after an abnormal Myoview suggested ISR. He was studied and had a stent placed to the OM and a PCI as well. He essentially has a full metal jacket OM.CFX. He did have a follow up Myoview in Jan 2014 and this was low risk. He has done well since his last PCI. He denies chest pain. He has had PAF since Sept 2012 but has been holding NSR on Amiodarone. He is on Coumadin and Plavix. He has spinal stenosis and had surgery in Jan 2016 which he tolerated well and had a good result. He is in the office for a 6 month check up. He denies chest pain or palpations.    Current Outpatient Prescriptions  Medication Sig Dispense Refill  . amiodarone (PACERONE) 200 MG tablet TAKE 1 TABLET EVERY DAY 90 tablet 2  . clopidogrel (PLAVIX) 75 MG tablet TAKE 1 TABLET (75 MG TOTAL) BY MOUTH DAILY. 90 tablet 3  . Coenzyme Q10 200 MG capsule Take 200 mg by mouth daily.    [name] doxazosin (CARDURA) 8 MG tablet Take 4 mg by mouth every evening.     . finasteride (PROSCAR) 5 MG tablet Take 5 mg by mouth daily.    [name] lisinopril (PRINIVIL,ZESTRIL) 20 MG tablet Take 1 tablet (20 mg total) by mouth daily. 30 tablet 11  . metoprolol succinate (TOPROL-XL) 25 MG 24 hr tablet Take 1 tablet (25 mg total) by mouth 2 (two) times daily. 180 tablet 3  . Multiple Vitamin (MULTIVITAMIN WITH MINERALS) TABS tablet Take 1 tablet by mouth daily.    . niacin (NIASPAN) 1000 MG CR tablet Take 2 tablets (2,000 mg total) by mouth at bedtime. 60 tablet 11  . nitroGLYCERIN (NITROSTAT)  0.4 MG SL tablet Place 1 tablet (0.4 mg total) under the tongue every 5 (five) minutes as needed. For chest pain (Patient taking differently: Place 0.4 mg under the tongue every 5 (five) minutes as needed for chest pain. ) 25 tablet 2  . Omega-3 Fatty Acids (FISH OIL) 1000 MG CAPS Take 1,000 capsules by mouth 2 (two) times daily.     . rosuvastatin (CRESTOR) 40 MG tablet Take 1 tablet (40 mg total) by mouth daily. 90 tablet 3  . warfarin (COUMADIN) 5 MG tablet Take 1 tablet by mouth daily or as directed by coumadin clinic 90 tablet 1   No current facility-administered medications for this visit.    Allergies  Allergen Reactions  . Lipitor [Atorvastatin] Other (See Comments)    Increased HR is currently tolerating --   . Morphine And Related Nausea And Vomiting  . Vicodin [Hydrocodone-Acetaminophen] Other (See Comments)    Feel crazy    Social History   Social History  . Marital Status: Married    Spouse Name: N/A  . Number of Children: N/A  . Years of Education: N/A   Occupational History  . Not on file.   Social History Main Topics  . Smoking status: Never Smoker   . Smokeless tobacco:  Never Used  . Alcohol Use: 1.2 oz/week    2 Cans of beer per week     Comment: rare  . Drug Use: No  . Sexual Activity: Not Currently   Other Topics Concern  . Not on file   Social History Narrative   Charles Robinson is a father of 2, grandfather of 22.  His daughter was classmates with Dr. Ellyn Hack in college.   He and his wife are currently in the process of moving houses, further down the street in order to downsize to a single-story house.  He has been working out at [employer].           Review of Systems: General: negative for chills, fever, night sweats or weight changes.  Cardiovascular: negative for chest pain, dyspnea on exertion, edema, orthopnea, palpitations, paroxysmal nocturnal dyspnea or shortness of breath Dermatological: negative for rash Respiratory: negative for cough or  wheezing Urologic: negative for hematuria Abdominal: negative for nausea, vomiting, diarrhea, bright red blood per rectum, melena, or hematemesis Neurologic: negative for visual changes, syncope, or dizziness All other systems reviewed and are otherwise negative except as noted above.    Blood pressure 140/86, pulse 53, height '6\' 3"'$  (1.905 m), weight 266 lb 1.6 oz (120.702 kg).  General appearance: alert, cooperative, no distress and mildly obese Neck: no JVD and Lt carotid bruit Lungs: clear to auscultation bilaterally Heart: regular rate and rhythm Abdomen: soft, non-tender; bowel sounds normal; no masses,  no organomegaly Extremities: extremities normal, atraumatic, no cyanosis or edema Pulses: 2+ and symmetric Skin: Skin color, texture, turgor normal. No rashes or lesions Neurologic: Grossly normal  EKG NSR, SB-53  ASSESSMENT AND PLAN:   CAD, s/p multiple PCIs s/p PCI to LAD, to RCA, then extensive PCI to LCx-OM1  (enitre prox-AVG Circ for dissection in 07/2011) Most recent Myoview: January 2014 low risk  PAF- NSR on Amiodarone NSR on Amiodarone, Coumadin (CHADs VASC=2)  Essential hypertension B/P a little elevated today. He will monitor this.  Dyslipidemia Excellent control- LDL 72 Sept 2016  Long term current use of anticoagulant therapy Coumadin Rx  Left carotid bruit Asymptomatic  Chronic renal insufficiency, stage III (moderate) GFR 47    PLAN  I have asked him to monitor his B/P at home. If his systolic consistently runs > 140 I would consider adding Norvasc. He has CRI-3, I would not push ACE or add a diuretic. He will report his B/P findings to [place] in Feb when he comes for a Coumadin check. I also ordered carotid dopplers, he has a Lt carotid bruit.   Kerin Ransom K PA-C 11/19/2015 8:28 AM

## 2015-11-19 NOTE — Assessment & Plan Note (Signed)
Coumadin Rx 

## 2015-11-19 NOTE — Assessment & Plan Note (Signed)
NSR on Amiodarone, Coumadin (CHADs VASC=2)

## 2015-12-06 ENCOUNTER — Telehealth: Payer: Self-pay | Admitting: Cardiology

## 2015-12-06 ENCOUNTER — Other Ambulatory Visit: Payer: Self-pay | Admitting: Cardiology

## 2015-12-06 NOTE — Telephone Encounter (Signed)
°*  STAT* If patient is at the pharmacy, call can be transferred to refill team.  1. Which medications need to be refilled? (please list name of each medication and dose if known) Nitroglycerin  2. Which pharmacy/location (including street and city if local pharmacy) is medication to be sent to?[phone]  3. Do they need a 30 day or 90 day supply? 1 bottle and refills

## 2015-12-09 MED ORDER — NITROGLYCERIN 0.4 MG SL SUBL: 0.4000 mg | SUBLINGUAL_TABLET | SUBLINGUAL | Status: DC | PRN

## 2015-12-30 ENCOUNTER — Ambulatory Visit (HOSPITAL_COMMUNITY)
Admission: RE | Admit: 2015-12-30 | Discharge: 2015-12-30 | Disposition: A | Discharge status: Home / Self Care | Payer: Medicare Other | Source: Ambulatory Visit | Attending: Cardiovascular Disease | Admitting: Cardiovascular Disease

## 2015-12-30 ENCOUNTER — Ambulatory Visit (INDEPENDENT_AMBULATORY_CARE_PROVIDER_SITE_OTHER): Payer: Medicare Other | Admitting: Pharmacist Clinician (PhC)/ Clinical Pharmacy Specialist

## 2015-12-30 DIAGNOSIS — I10 Essential (primary) hypertension: Secondary | ICD-10-CM | POA: Diagnosis not present

## 2015-12-30 DIAGNOSIS — I6523 Occlusion and stenosis of bilateral carotid arteries: Secondary | ICD-10-CM | POA: Insufficient documentation

## 2015-12-30 DIAGNOSIS — E785 Hyperlipidemia, unspecified: Secondary | ICD-10-CM | POA: Insufficient documentation

## 2015-12-30 DIAGNOSIS — I4891 Unspecified atrial fibrillation: Secondary | ICD-10-CM

## 2015-12-30 DIAGNOSIS — R0989 Other specified symptoms and signs involving the circulatory and respiratory systems: Secondary | ICD-10-CM

## 2015-12-30 DIAGNOSIS — Z7901 Long term (current) use of anticoagulants: Secondary | ICD-10-CM

## 2015-12-30 LAB — POCT INR: INR: 2.5

## 2016-01-12 ENCOUNTER — Other Ambulatory Visit: Payer: Self-pay | Admitting: Cardiology

## 2016-01-13 NOTE — Telephone Encounter (Signed)
Rx(s) sent to pharmacy electronically.  

## 2016-01-14 ENCOUNTER — Ambulatory Visit (INDEPENDENT_AMBULATORY_CARE_PROVIDER_SITE_OTHER): Payer: Medicare Other | Admitting: Cardiovascular Disease

## 2016-01-14 ENCOUNTER — Encounter: Payer: Self-pay | Admitting: Cardiovascular Disease

## 2016-01-14 VITALS — BP 158/90 | HR 60 | Ht 75.0 in | Wt 269.0 lb

## 2016-01-14 DIAGNOSIS — R0989 Other specified symptoms and signs involving the circulatory and respiratory systems: Secondary | ICD-10-CM

## 2016-01-14 NOTE — Progress Notes (Signed)
01/14/2016 Charles Robinson   11-27-1945  [id number]  Primary Physician Charles Redwood, MD Primary Cardiologist: Charles Harp MD Charles Robinson   HPI:  Charles Robinson is a 70 year old married Caucasian male father of 2, grandfather and 4 grandchildren who is a retired Optometrist and is referred to me that by Charles Robinson for high-grade asymptomatic left internal carotid artery stenosis. He has a history of treated hypertension and hyperlipidemia. He does have a history of ischemic heart disease status post LAD stenting in 2004, circumflex and obtuse marginal branch intervention in 2012. He has proximal A. Fib maintaining sinus rhythm on Coumadin anticoagulation and amiodarone. He does take Plavix in addition. He has never had a heart attack or stroke. He denies chest pain or shortness of breath. Recent carotid Dopplers before performed because of auscultated bruit on 12/30/15 revealed high-grade left ICA stenosis.   Current Outpatient Prescriptions  Medication Sig Dispense Refill  . amiodarone (PACERONE) 200 MG tablet TAKE 1 TABLET EVERY DAY 90 tablet 2  . clopidogrel (PLAVIX) 75 MG tablet TAKE 1 TABLET (75 MG TOTAL) BY MOUTH DAILY. 90 tablet 3  . Coenzyme Q10 200 MG capsule Take 200 mg by mouth daily.    [name] doxazosin (CARDURA) 8 MG tablet Take 4 mg by mouth every evening.     . finasteride (PROSCAR) 5 MG tablet Take 5 mg by mouth daily.    [name] lisinopril (PRINIVIL,ZESTRIL) 20 MG tablet TAKE 1 TABLET (20 MG TOTAL) BY MOUTH DAILY. 30 tablet 11  . metoprolol succinate (TOPROL-XL) 25 MG 24 hr tablet Take 1 tablet (25 mg total) by mouth 2 (two) times daily. 180 tablet 3  . Multiple Vitamin (MULTIVITAMIN WITH MINERALS) TABS tablet Take 1 tablet by mouth daily.    . niacin (NIASPAN) 1000 MG CR tablet Take 2 tablets (2,000 mg total) by mouth at bedtime. 60 tablet 11  . nitroGLYCERIN (NITROSTAT) 0.4 MG SL tablet Place 1 tablet (0.4 mg total) under the tongue every 5 (five) minutes as needed for  chest pain. 25 tablet 1  . Omega-3 Fatty Acids (FISH OIL) 1000 MG CAPS Take 1,000 capsules by mouth 2 (two) times daily.     . rosuvastatin (CRESTOR) 40 MG tablet Take 1 tablet (40 mg total) by mouth daily. 90 tablet 3  . warfarin (COUMADIN) 5 MG tablet Take 1 tablet by mouth daily or as directed by coumadin clinic 90 tablet 1   No current facility-administered medications for this visit.    Allergies  Allergen Reactions  . Lipitor [Atorvastatin] Other (See Comments)    Increased HR is currently tolerating --   . Morphine And Related Nausea And Vomiting  . Vicodin [Hydrocodone-Acetaminophen] Other (See Comments)    Feel crazy    Social History   Social History  . Marital Status: Married    Spouse Name: N/A  . Number of Children: N/A  . Years of Education: N/A   Occupational History  . Not on file.   Social History Main Topics  . Smoking status: Never Smoker   . Smokeless tobacco: Never Used  . Alcohol Use: 1.2 oz/week    2 Cans of beer per week     Comment: rare  . Drug Use: No  . Sexual Activity: Not Currently   Other Topics Concern  . Not on file   Social History Narrative   Charles Robinson is a father of 2, grandfather of 72.  His daughter was classmates with Charles Robinson in college.  He and his wife are currently in the process of moving houses, further down the street in order to downsize to a single-story house.  He has been working out at [employer].           Review of Systems: General: negative for chills, fever, night sweats or weight changes.  Cardiovascular: negative for chest pain, dyspnea on exertion, edema, orthopnea, palpitations, paroxysmal nocturnal dyspnea or shortness of breath Dermatological: negative for rash Respiratory: negative for cough or wheezing Urologic: negative for hematuria Abdominal: negative for nausea, vomiting, diarrhea, bright red blood per rectum, melena, or hematemesis Neurologic: negative for visual changes, syncope, or dizziness All  other systems reviewed and are otherwise negative except as noted above.    Blood pressure 158/90, pulse 60, height '6\' 3"'$  (1.905 m), weight 269 lb (122.018 kg).  General appearance: alert and no distress Neck: no adenopathy, no JVD, supple, symmetrical, trachea midline, thyroid not enlarged, symmetric, no tenderness/mass/nodules and soft left carotid bruit Lungs: clear to auscultation bilaterally Heart: regular rate and rhythm, S1, S2 normal, no murmur, click, rub or gallop Extremities: extremities normal, atraumatic, no cyanosis or edema  EKG not performed today  ASSESSMENT AND PLAN:   Left carotid bruit Charles Robinson is a 70 year old moderately overweight married Caucasian male patient of Charles Robinson referred for asymptomatic high-grade left internal carotid artery stenosis. He has a history of ischemic heart disease with multiple stents in the past beginning back in 2000 for to his LAD circumflex and obtuse marginal branches. In addition Charles Robinson has PAF maintaining sinus rhythm on Coumadin anticoagulation and amiodarone. He is on chronic Plavix. He's had multiple surgeries in the past during which he interrupted his antibiotic therapy without adverse outcome. Recent Dopplers performed because it auscultated bruit on 12/30/15 showed severe left ICA stenosis. He is neurologically asymptomatic. Based on this, I think he isn't excellent candidate for endarterectomy. I'm referring him to Charles Robinson for consideration of this.      Charles Harp MD FACP,FACC,FAHA, [facility] 01/14/2016 9:24 AM

## 2016-01-14 NOTE — Assessment & Plan Note (Signed)
Mr. Charles Robinson is a 71 year old moderately overweight married Caucasian male patient of Dr. Allison Quarry referred for asymptomatic high-grade left internal carotid artery stenosis. He has a history of ischemic heart disease with multiple stents in the past beginning back in 2000 for to his LAD circumflex and obtuse marginal branches. In addition [name] has PAF maintaining sinus rhythm on Coumadin anticoagulation and amiodarone. He is on chronic Plavix. He's had multiple surgeries in the past during which he interrupted his antibiotic therapy without adverse outcome. Recent Dopplers performed because it auscultated bruit on 12/30/15 showed severe left ICA stenosis. He is neurologically asymptomatic. Based on this, I think he isn't excellent candidate for endarterectomy. I'm referring him to [name] for consideration of this.

## 2016-01-14 NOTE — Patient Instructions (Addendum)
Medication Instructions:  Your physician recommends that you continue on your current medications as directed. Please refer to the Current Medication list given to you today.   Labwork: none  Testing/Procedures: none  Follow-Up: You have been referred to Dr. Russella Dar - Carotid Endarterectomy    Any Other Special Instructions Will Be Listed Below (If Applicable).     If you need a refill on your cardiac medications before your next appointment, please call your pharmacy.

## 2016-01-20 ENCOUNTER — Other Ambulatory Visit: Payer: Self-pay | Admitting: *Deleted

## 2016-01-20 DIAGNOSIS — I6522 Occlusion and stenosis of left carotid artery: Secondary | ICD-10-CM

## 2016-02-05 ENCOUNTER — Encounter: Payer: Self-pay | Admitting: Surgery

## 2016-02-10 ENCOUNTER — Encounter: Payer: BLUE CROSS/BLUE SHIELD | Admitting: Pharmacist Clinician (PhC)/ Clinical Pharmacy Specialist

## 2016-02-10 ENCOUNTER — Ambulatory Visit (INDEPENDENT_AMBULATORY_CARE_PROVIDER_SITE_OTHER): Payer: Medicare Other | Admitting: Surgery

## 2016-02-10 ENCOUNTER — Ambulatory Visit (HOSPITAL_COMMUNITY)
Admission: RE | Admit: 2016-02-10 | Discharge: 2016-02-10 | Disposition: A | Discharge status: Home / Self Care | Payer: Medicare Other | Source: Ambulatory Visit | Attending: Surgery | Admitting: Surgery

## 2016-02-10 ENCOUNTER — Encounter: Payer: Self-pay | Admitting: Surgery

## 2016-02-10 ENCOUNTER — Ambulatory Visit (INDEPENDENT_AMBULATORY_CARE_PROVIDER_SITE_OTHER): Payer: Medicare Other | Admitting: Pharmacist Clinician (PhC)/ Clinical Pharmacy Specialist

## 2016-02-10 VITALS — BP 133/71 | HR 66 | Temp 97.6°F | Resp 20 | Ht 72.2 in | Wt 275.0 lb

## 2016-02-10 DIAGNOSIS — I10 Essential (primary) hypertension: Secondary | ICD-10-CM | POA: Insufficient documentation

## 2016-02-10 DIAGNOSIS — E785 Hyperlipidemia, unspecified: Secondary | ICD-10-CM | POA: Insufficient documentation

## 2016-02-10 DIAGNOSIS — I6522 Occlusion and stenosis of left carotid artery: Secondary | ICD-10-CM | POA: Insufficient documentation

## 2016-02-10 DIAGNOSIS — I4891 Unspecified atrial fibrillation: Secondary | ICD-10-CM | POA: Diagnosis not present

## 2016-02-10 DIAGNOSIS — K219 Gastro-esophageal reflux disease without esophagitis: Secondary | ICD-10-CM | POA: Diagnosis not present

## 2016-02-10 DIAGNOSIS — Z7901 Long term (current) use of anticoagulants: Secondary | ICD-10-CM | POA: Diagnosis not present

## 2016-02-10 LAB — POCT INR: INR: 2.4

## 2016-02-10 NOTE — Progress Notes (Signed)
Filed Vitals:   02/10/16 1312 02/10/16 1314 02/10/16 1318  BP: 162/79 151/68 133/71  Pulse: 66    Temp: 97.6 F (36.4 C)    TempSrc: Oral    Resp: 20    Height: 6' 0.2" (1.834 m)    Weight: 275 lb (124.739 kg)

## 2016-02-10 NOTE — Progress Notes (Signed)
Patient name: Charles Robinson MRN: [id number] DOB: 11/25/1945 Sex: male   Referred by: Dr. Gwenlyn Found  Reason for referral:  Chief Complaint  Patient presents with  . New Evaluation    referred by Dr. Gwenlyn Found for Left ICA Stenosis    HISTORY OF PRESENT ILLNESS: This is a 70 year old gentleman that is referred today for evaluation of left carotid stenosis.  This was detected with a bruit.  He is asymptomatic.  Specifically, he denies numbness or weakness in either extremity.  He denies slurred speech.  He denies amaurosis fugax.  He has a history of ischemic heart disease, status post LAD stent placement in 2004.  He had circumflex and obtuse marginal branch interventions in 2012.  He also suffers from paroxysmal atrial fibrillation, maintained in sinus rhythm on Coumadin and amiodarone.  He suffers from hypertension which is managed with an ACE inhibitor.  His hypercholesterolemia is treated with a statin.  He is a nonsmoker  Past Medical History  Diagnosis Date  . History of colon polyps   . Hyperlipidemia   . Hypertension   . BPH (benign prostatic hypertrophy)   . Cancer ([place])     SKIN CA  . Paroxysmal atrial fibrillation [facility])     followed by Dr. Glenetta Hew; s/p DCCV 2011, 07/2011 post PCI with Type 4a MI  . Rupture quadriceps tendon   . Coronary artery disease 03/15/2012    s/p PCI to LAD, to RCA, then extensive PCI to LCx-OM1  (enitre prox-AVG Circ for dissection in 07/2011)  . Chest pain 08/16/2011    Echo - EF 60-65%; moderate LV concentric hypertrophy; abdnormal LV relaxation (grade 1 diastolic dysfunction; ascending aorta mildly dilated; LA moderately dilated;   . Atrial flutter ([place]) 08/20/2011    TEE- atrial septum - no defect identified; RA normal in size, no evidence of thrombus; ascending aorta normal  . PAF (paroxysmal atrial fibrillation) ([place]) 12/20/2012    R/L CL - EF 61%; normal EKG, no motion artifact, no evidence of ischemia, normal BP response, low risk stress  nuclear study  . Bruit 08/01/2009    doppler - no suggestion of significant diameter reduction, dissection or aneurysmal dilation.  normal evaluation  . GERD (gastroesophageal reflux disease)   . Arthritis     back    Past Surgical History  Procedure Laterality Date  . Coronary stent placement  2003 - 2011 - 2012 - 2013    x 8 stents (per pt)  . Coronary angioplasty with stent placement  03/15/2012    multiple stens in [place]  . Back surgery    . Cardiac catheterization  03/15/2012    patent LAD stents, patent LCx stents w/80% focal ISR just distal to OM; OM proximal stent open; 2 site PTCA w/ PTCA and stenting of OM2 ang PTCA of distal stent ISR followed by PTCA of mid LCx ISR  . Quadriceps tendon repair  09/19/2012    Procedure: REPAIR QUADRICEP TENDON;  Surgeon: [name], MD;  Location: [facility] ORS;  Service: Orthopedics;  Laterality: Left;  . Nm myoview ltd  11/2012    No ischemia or infarction, normal EF & WM  . Doppler echocardiography  07/2011    EF ~60-65%, Grade 1 D Dysfxn; mod Conc LVH  . Left heart catheterization with coronary angiogram N/A 03/15/2012    Procedure: LEFT HEART CATHETERIZATION WITH CORONARY ANGIOGRAM;  Surgeon: [name], MD;  Location: [facility] CATH LAB;  Service: Cardiovascular;  Laterality:  N/A;  . Lumbar laminectomy/decompression microdiscectomy Bilateral 12/11/2014    Procedure: Bilateral Lumbar Three-Four Laminectomy;  Surgeon: Kristeen Miss, MD;  Location: [place] NEURO ORS;  Service: Neurosurgery;  Laterality: Bilateral;  bilateral    Social History   Social History  . Marital Status: Married    Spouse Name: N/A  . Number of Children: N/A  . Years of Education: N/A   Occupational History  . Not on file.   Social History Main Topics  . Smoking status: Never Smoker   . Smokeless tobacco: Never Used  . Alcohol Use: 1.2 oz/week    2 Cans of beer per week     Comment: rare  . Drug Use: No  . Sexual Activity: Not Currently   Other Topics  Concern  . Not on file   Social History Narrative   Charles Robinson is a father of 2, grandfather of 66.  His daughter was classmates with Dr. Ellyn Hack in college.   He and his wife are currently in the process of moving houses, further down the street in order to downsize to a single-story house.  He has been working out at [employer].          Family History  Problem Relation Age of Onset  . Heart disease Father     Allergies as of 02/10/2016 - Review Complete 02/10/2016  Allergen Reaction Noted  . Lipitor [atorvastatin] Other (See Comments) 11/08/2012  . Morphine and related Nausea And Vomiting 09/16/2012  . Vicodin [hydrocodone-acetaminophen] Other (See Comments) 09/16/2012    Current Outpatient Prescriptions on File Prior to Visit  Medication Sig Dispense Refill  . amiodarone (PACERONE) 200 MG tablet TAKE 1 TABLET EVERY DAY 90 tablet 2  . clopidogrel (PLAVIX) 75 MG tablet TAKE 1 TABLET (75 MG TOTAL) BY MOUTH DAILY. 90 tablet 3  . Coenzyme Q10 200 MG capsule Take 200 mg by mouth daily.    [name] doxazosin (CARDURA) 8 MG tablet Take 4 mg by mouth every evening.     . finasteride (PROSCAR) 5 MG tablet Take 5 mg by mouth daily.    [name] lisinopril (PRINIVIL,ZESTRIL) 20 MG tablet TAKE 1 TABLET (20 MG TOTAL) BY MOUTH DAILY. 30 tablet 11  . metoprolol succinate (TOPROL-XL) 25 MG 24 hr tablet Take 1 tablet (25 mg total) by mouth 2 (two) times daily. 180 tablet 3  . Multiple Vitamin (MULTIVITAMIN WITH MINERALS) TABS tablet Take 1 tablet by mouth daily.    . niacin (NIASPAN) 1000 MG CR tablet Take 2 tablets (2,000 mg total) by mouth at bedtime. 60 tablet 11  . nitroGLYCERIN (NITROSTAT) 0.4 MG SL tablet Place 1 tablet (0.4 mg total) under the tongue every 5 (five) minutes as needed for chest pain. 25 tablet 1  . Omega-3 Fatty Acids (FISH OIL) 1000 MG CAPS Take 1,000 capsules by mouth 2 (two) times daily.     . rosuvastatin (CRESTOR) 40 MG tablet Take 1 tablet (40 mg total) by mouth daily. 90 tablet 3  .  warfarin (COUMADIN) 5 MG tablet Take 1 tablet by mouth daily or as directed by coumadin clinic 90 tablet 1   No current facility-administered medications on file prior to visit.     REVIEW OF SYSTEMS: Cardiovascular: No chest pain, chest pressure, palpitations, orthopnea, or dyspnea on exertion. No claudication or rest pain,  No history of DVT or phlebitis. Pulmonary: No productive cough, asthma or wheezing. Neurologic: No weakness, paresthesias, aphasia, or amaurosis. No dizziness. Hematologic: No bleeding problems or clotting disorders. Musculoskeletal: No  joint pain or joint swelling. Gastrointestinal: No blood in stool or hematemesis Genitourinary: No dysuria or hematuria. Psychiatric:: No history of major depression. Integumentary: No rashes or ulcers. Constitutional: No fever or chills.  PHYSICAL EXAMINATION:  Filed Vitals:   02/10/16 1312 02/10/16 1314 02/10/16 1318  BP: 162/79 151/68 133/71  Pulse: 66    Temp: 97.6 F (36.4 C)    TempSrc: Oral    Resp: 20    Height: 6' 0.2" (1.834 m)    Weight: 275 lb (124.739 kg)     Body mass index is 37.09 kg/(m^2). General: The patient appears their stated age.   HEENT:  No gross abnormalities Pulmonary: Respirations are non-labored Abdomen: Soft and non-tender  Musculoskeletal: There are no major deformities.   Neurologic: No focal weakness or paresthesias are detected, Skin: There are no ulcer or rashes noted. Psychiatric: The patient has normal affect. Cardiovascular: There is a regular rate and rhythm without significant murmur appreciated.Positive carotid bruit  Diagnostic Studies: I have reviewed the ultrasound studies which show 80-99 percent left carotid stenosis and 0-39 percent right carotid stenosis.  The bifurcation is in the mid neck and the artery is normal past the stenosis.   Assessment:  Asymptomatic left carotid stenosis Plan: We discussed proceeding with left carotid endarterectomy.  I discussed the  risks and benefits of the operation including but not limited to the risk of cardiopulmonary complications, stroke, nerve injury, and bleeding.  I will ask Dr. Ellyn Hack regarding discontinuation of Plavix and Coumadin 5 days prior to his operation.  I will start him on a baby aspirin at that time and resume Coumadin and Plavix once he has recovered from his operation.  This is been scheduled for Thursday, April 6     V. Leia Alf, M.D. [facility] Office: [phone] Pager:  [phone]

## 2016-02-12 ENCOUNTER — Other Ambulatory Visit: Payer: Self-pay

## 2016-02-12 ENCOUNTER — Telehealth: Payer: Self-pay | Admitting: Cardiovascular Disease

## 2016-02-12 NOTE — Telephone Encounter (Signed)
Request for surgical clearance:  1. What type of surgery is being performed? Endarterectomy Carotid surgery   2. When is this surgery scheduled? 4/6   3. Are there any medications that need to be held prior to surgery and how long? Plavix and Coumadin 5 days prior   4. Name of physician performing surgery? Vance Brabham  5. What is your office phone and fax number? [phone]) 6.

## 2016-02-13 NOTE — Telephone Encounter (Signed)
Per our office protocol CHADS2 score is 1 (hypertension), CHADS2-VASc is 3.  Ok to hold warfarin x 5 days prior to procedure.

## 2016-02-13 NOTE — Telephone Encounter (Signed)
Will forward to Dr. Ellyn Hack for clearance to hold clopidogrel x 5 days.

## 2016-02-15 NOTE — Telephone Encounter (Signed)
OK to hold Plavix for procedure.

## 2016-02-17 NOTE — Telephone Encounter (Signed)
FORWARD TO [facility]- Colletta Maryland RN

## 2016-02-20 ENCOUNTER — Other Ambulatory Visit (HOSPITAL_COMMUNITY): Payer: Self-pay | Admitting: *Deleted

## 2016-02-20 NOTE — Pre-Procedure Instructions (Signed)
Charles Robinson  02/20/2016     Your procedure is scheduled on Thursday, February 27, 2016 at 7:30 AM.   Report to [facility] Entrance "A" Admitting Office at 5:30 AM.   Call this number if you have problems the morning of surgery: [phone]   Any questions prior to day of surgery, please call [phone] between 8 & 4 PM.   Remember:  Do not eat food or drink liquids after midnight Wednesday, 02/26/16.   Take these medicines the morning of surgery with A SIP OF WATER: Amiodarone (Pacerone), Metoprolol (Toprol XL)  Stop Plavix and Warfarin (Coumadin) as instructed by physician. Stop Fish Oil and Multivitamins as of today.    Do not wear jewelry.  Do not wear lotions, powders, or cologne.  You may wear deodorant.  Men may shave face and neck.  Do not bring valuables to the hospital.  [facility] is not responsible for any belongings or valuables.  Contacts, dentures or bridgework may not be worn into surgery.  Leave your suitcase in the car.  After surgery it may be brought to your room.  For patients admitted to the hospital, discharge time will be determined by your treatment team.  Special instructions:   - Preparing for Surgery  Before surgery, you can play an important role.  Because skin is not sterile, your skin needs to be as free of germs as possible.  You can reduce the number of germs on you skin by washing with CHG (chlorahexidine gluconate) soap before surgery.  CHG is an antiseptic cleaner which kills germs and bonds with the skin to continue killing germs even after washing.  Please DO NOT use if you have an allergy to CHG or antibacterial soaps.  If your skin becomes reddened/irritated stop using the CHG and inform your nurse when you arrive at Short Stay.  Do not shave (including legs and underarms) for at least 48 hours prior to the first CHG shower.  You may shave your face.  Please follow these instructions carefully:   1.  Shower with CHG  Soap the night before surgery and the                                morning of Surgery.  2.  If you choose to wash your hair, wash your hair first as usual with your       normal shampoo.  3.  After you shampoo, rinse your hair and body thoroughly to remove the                      Shampoo.  4.  Use CHG as you would any other liquid soap.  You can apply chg directly       to the skin and wash gently with scrungie or a clean washcloth.  5.  Apply the CHG Soap to your body ONLY FROM THE NECK DOWN.        Do not use on open wounds or open sores.  Avoid contact with your eyes, ears, mouth and genitals (private parts).  Wash genitals (private parts) with your normal soap.  6.  Wash thoroughly, paying special attention to the area where your surgery        will be performed.  7.  Thoroughly rinse your body with warm water from the neck down.  8.  DO NOT shower/wash with your normal  soap after using and rinsing off       the CHG Soap.  9.  Pat yourself dry with a clean towel.            10.  Wear clean pajamas.            11.  Place clean sheets on your bed the night of your first shower and do not        sleep with pets.  Day of Surgery  Do not apply any lotions the morning of surgery.  Please wear clean clothes to the hospital.   Please read over the following fact sheets that you were given. Pain Booklet, Coughing and Deep Breathing, Blood Transfusion Information, MRSA Information and Surgical Site Infection Prevention

## 2016-02-21 ENCOUNTER — Encounter (HOSPITAL_COMMUNITY)
Admission: RE | Admit: 2016-02-21 | Discharge: 2016-02-21 | Disposition: A | Discharge status: Home / Self Care | Payer: Medicare Other | Source: Ambulatory Visit | Attending: Surgery | Admitting: Surgery

## 2016-02-21 ENCOUNTER — Encounter (HOSPITAL_COMMUNITY): Payer: Self-pay

## 2016-02-21 DIAGNOSIS — Z01812 Encounter for preprocedural laboratory examination: Secondary | ICD-10-CM | POA: Insufficient documentation

## 2016-02-21 DIAGNOSIS — Z0183 Encounter for blood typing: Secondary | ICD-10-CM | POA: Insufficient documentation

## 2016-02-21 DIAGNOSIS — I6522 Occlusion and stenosis of left carotid artery: Secondary | ICD-10-CM | POA: Diagnosis not present

## 2016-02-21 LAB — TYPE AND SCREEN
ABO/RH(D): B POS
Antibody Screen: NEGATIVE

## 2016-02-21 LAB — URINALYSIS, ROUTINE W REFLEX MICROSCOPIC
Glucose, UA: NEGATIVE mg/dL
Ketones, ur: 15 mg/dL — AB
Leukocytes, UA: NEGATIVE
Nitrite: NEGATIVE
Protein, ur: NEGATIVE mg/dL
Specific Gravity, Urine: 1.025 (ref 1.005–1.030)
pH: 5 (ref 5.0–8.0)

## 2016-02-21 LAB — COMPREHENSIVE METABOLIC PANEL
ALT: 26 U/L (ref 17–63)
AST: 35 U/L (ref 15–41)
Albumin: 4 g/dL (ref 3.5–5.0)
Alkaline Phosphatase: 55 U/L (ref 38–126)
Anion gap: 9 (ref 5–15)
BUN: 22 mg/dL — ABNORMAL HIGH (ref 6–20)
CO2: 26 mmol/L (ref 22–32)
Calcium: 9.2 mg/dL (ref 8.9–10.3)
Chloride: 108 mmol/L (ref 101–111)
Creatinine, Ser: 1.48 mg/dL — ABNORMAL HIGH (ref 0.61–1.24)
GFR calc Af Amer: 53 mL/min — ABNORMAL LOW (ref >60)
GFR calc non Af Amer: 46 mL/min — ABNORMAL LOW (ref >60)
Glucose, Bld: 106 mg/dL — ABNORMAL HIGH (ref 65–99)
Potassium: 3.8 mmol/L (ref 3.5–5.1)
Sodium: 143 mmol/L (ref 135–145)
Total Bilirubin: 2 mg/dL — ABNORMAL HIGH (ref 0.3–1.2)
Total Protein: 6.5 g/dL (ref 6.5–8.1)

## 2016-02-21 LAB — CBC
HCT: 44.4 % (ref 39.0–52.0)
Hemoglobin: 15 g/dL (ref 13.0–17.0)
MCH: 31.6 pg (ref 26.0–34.0)
MCHC: 33.8 g/dL (ref 30.0–36.0)
MCV: 93.5 fL (ref 78.0–100.0)
Platelets: 109 10*3/uL — ABNORMAL LOW (ref 150–400)
RBC: 4.75 MIL/uL (ref 4.22–5.81)
RDW: 13.6 % (ref 11.5–15.5)
WBC: 4.6 10*3/uL (ref 4.0–10.5)

## 2016-02-21 LAB — URINE MICROSCOPIC-ADD ON
Bacteria, UA: NONE SEEN
Squamous Epithelial / LPF: NONE SEEN
WBC, UA: NONE SEEN WBC/hpf (ref 0–5)

## 2016-02-21 LAB — ABO/RH: ABO/RH(D): B POS

## 2016-02-21 LAB — APTT: aPTT: 32 seconds (ref 24–37)

## 2016-02-21 LAB — PROTIME-INR
INR: 1.92 — ABNORMAL HIGH (ref 0.00–1.49)
Prothrombin Time: 21.9 seconds — ABNORMAL HIGH (ref 11.6–15.2)

## 2016-02-21 LAB — SURGICAL PCR SCREEN
MRSA, PCR: NEGATIVE
Staphylococcus aureus: NEGATIVE

## 2016-02-21 NOTE — Progress Notes (Addendum)
Pt has a cardiac history- has stents. Denies any recent chest pain or sob. Dr. Gwenlyn Found is his cardiologist.   EKG - 11/19/15 - in [place] - 12/20/12 - in [place] [facility] - 03/05/12 - in [facility]  Pt states today, 02/21/16 will be the last dose of Coumadin and Plavix prior to surgery.

## 2016-02-26 ENCOUNTER — Encounter (HOSPITAL_COMMUNITY): Payer: Self-pay | Admitting: Anesthesiology

## 2016-02-27 ENCOUNTER — Encounter (HOSPITAL_COMMUNITY)
Admission: RE | Disposition: A | Discharge status: Home / Self Care | Payer: Self-pay | Source: Ambulatory Visit | Attending: Surgery

## 2016-02-27 ENCOUNTER — Inpatient Hospital Stay (HOSPITAL_COMMUNITY)
Admission: RE | Admit: 2016-02-27 | Discharge: 2016-02-28 | DRG: 039 | Disposition: A | Discharge status: Home / Self Care | Payer: Medicare Other | Source: Ambulatory Visit | Attending: Surgery | Admitting: Surgery

## 2016-02-27 ENCOUNTER — Inpatient Hospital Stay (HOSPITAL_COMMUNITY): Payer: Medicare Other | Admitting: Anesthesiology

## 2016-02-27 ENCOUNTER — Encounter (HOSPITAL_COMMUNITY): Payer: Self-pay | Admitting: *Deleted

## 2016-02-27 DIAGNOSIS — Z955 Presence of coronary angioplasty implant and graft: Secondary | ICD-10-CM

## 2016-02-27 DIAGNOSIS — I6522 Occlusion and stenosis of left carotid artery: Principal | ICD-10-CM | POA: Diagnosis present

## 2016-02-27 DIAGNOSIS — I252 Old myocardial infarction: Secondary | ICD-10-CM

## 2016-02-27 DIAGNOSIS — N400 Enlarged prostate without lower urinary tract symptoms: Secondary | ICD-10-CM | POA: Diagnosis present

## 2016-02-27 DIAGNOSIS — Z7902 Long term (current) use of antithrombotics/antiplatelets: Secondary | ICD-10-CM

## 2016-02-27 DIAGNOSIS — Z7901 Long term (current) use of anticoagulants: Secondary | ICD-10-CM | POA: Diagnosis not present

## 2016-02-27 DIAGNOSIS — E785 Hyperlipidemia, unspecified: Secondary | ICD-10-CM | POA: Diagnosis present

## 2016-02-27 DIAGNOSIS — I480 Paroxysmal atrial fibrillation: Secondary | ICD-10-CM | POA: Diagnosis present

## 2016-02-27 DIAGNOSIS — M1990 Unspecified osteoarthritis, unspecified site: Secondary | ICD-10-CM | POA: Diagnosis present

## 2016-02-27 DIAGNOSIS — K219 Gastro-esophageal reflux disease without esophagitis: Secondary | ICD-10-CM | POA: Diagnosis present

## 2016-02-27 DIAGNOSIS — I2510 Atherosclerotic heart disease of native coronary artery without angina pectoris: Secondary | ICD-10-CM | POA: Diagnosis present

## 2016-02-27 DIAGNOSIS — I10 Essential (primary) hypertension: Secondary | ICD-10-CM | POA: Diagnosis present

## 2016-02-27 HISTORY — PX: ENDARTERECTOMY: SHX5162

## 2016-02-27 HISTORY — PX: PATCH ANGIOPLASTY: SHX6230

## 2016-02-27 LAB — PROTIME-INR
INR: 1.11 (ref 0.00–1.49)
Prothrombin Time: 14.5 seconds (ref 11.6–15.2)

## 2016-02-27 SURGERY — ENDARTERECTOMY, CAROTID
Anesthesia: General | Site: Neck | Laterality: Left

## 2016-02-27 MED ORDER — ACETAMINOPHEN 650 MG RE SUPP: 325.0000 mg | RECTAL | Status: DC | PRN

## 2016-02-27 MED ORDER — METOPROLOL SUCCINATE ER 25 MG PO TB24
25.0000 mg | ORAL_TABLET | Freq: Two times a day (BID) | ORAL | Status: DC
  Administered 2016-02-28: 25 mg via ORAL
  Filled 2016-02-27 (×2): qty 1

## 2016-02-27 MED ORDER — DOXAZOSIN MESYLATE 4 MG PO TABS
4.0000 mg | ORAL_TABLET | Freq: Every evening | ORAL | Status: DC
  Administered 2016-02-27: 4 mg via ORAL
  Filled 2016-02-27: qty 1

## 2016-02-27 MED ORDER — SODIUM CHLORIDE 0.9 % IV SOLN
INTRAVENOUS | Status: DC | PRN
  Administered 2016-02-27: 500 mL

## 2016-02-27 MED ORDER — LIDOCAINE HCL (PF) 1 % IJ SOLN
INTRAMUSCULAR | Status: AC
  Filled 2016-02-27: qty 30

## 2016-02-27 MED ORDER — HEPARIN SODIUM (PORCINE) 1000 UNIT/ML IJ SOLN
INTRAMUSCULAR | Status: DC | PRN
  Administered 2016-02-27: 8000 [IU] via INTRAVENOUS
  Administered 2016-02-27: 8 [IU] via INTRAVENOUS
  Administered 2016-02-27: 2000 [IU] via INTRAVENOUS
  Administered 2016-02-27: 3000 [IU] via INTRAVENOUS

## 2016-02-27 MED ORDER — DEXTROSE 5 % IV SOLN
1.5000 g | Freq: Two times a day (BID) | INTRAVENOUS | Status: AC
  Administered 2016-02-27 – 2016-02-28 (×2): 1.5 g via INTRAVENOUS
  Filled 2016-02-27 (×3): qty 1.5

## 2016-02-27 MED ORDER — DEXTROSE 5 % IV SOLN
INTRAVENOUS | Status: AC
  Filled 2016-02-27: qty 1.5

## 2016-02-27 MED ORDER — GLYCOPYRROLATE 0.2 MG/ML IJ SOLN
INTRAMUSCULAR | Status: AC
  Filled 2016-02-27: qty 1

## 2016-02-27 MED ORDER — HYDROMORPHONE HCL 1 MG/ML IJ SOLN: 0.5000 mg | INTRAMUSCULAR | Status: DC | PRN

## 2016-02-27 MED ORDER — CHLORHEXIDINE GLUCONATE CLOTH 2 % EX PADS: 6.0000 | MEDICATED_PAD | Freq: Once | CUTANEOUS | Status: DC

## 2016-02-27 MED ORDER — ACETAMINOPHEN 325 MG PO TABS
325.0000 mg | ORAL_TABLET | ORAL | Status: DC | PRN
  Administered 2016-02-28: 650 mg via ORAL
  Filled 2016-02-27: qty 2

## 2016-02-27 MED ORDER — LACTATED RINGERS IV SOLN
INTRAVENOUS | Status: DC | PRN
  Administered 2016-02-27: 11:00:00 via INTRAVENOUS

## 2016-02-27 MED ORDER — SODIUM CHLORIDE 0.9 % IV SOLN: 500.0000 mL | Freq: Once | INTRAVENOUS | Status: DC | PRN

## 2016-02-27 MED ORDER — DOCUSATE SODIUM 100 MG PO CAPS
100.0000 mg | ORAL_CAPSULE | Freq: Every day | ORAL | Status: DC
  Administered 2016-02-28: 100 mg via ORAL
  Filled 2016-02-27: qty 1

## 2016-02-27 MED ORDER — PROPOFOL 10 MG/ML IV BOLUS
INTRAVENOUS | Status: DC | PRN
  Administered 2016-02-27: 30 mg via INTRAVENOUS
  Administered 2016-02-27: 130 mg via INTRAVENOUS

## 2016-02-27 MED ORDER — PROTAMINE SULFATE 10 MG/ML IV SOLN
INTRAVENOUS | Status: AC
  Filled 2016-02-27: qty 5

## 2016-02-27 MED ORDER — LACTATED RINGERS IV SOLN
INTRAVENOUS | Status: DC | PRN
  Administered 2016-02-27 (×2): via INTRAVENOUS

## 2016-02-27 MED ORDER — MAGNESIUM HYDROXIDE 400 MG/5ML PO SUSP: 30.0000 mL | Freq: Every day | ORAL | Status: DC | PRN

## 2016-02-27 MED ORDER — SODIUM CHLORIDE 0.9 % IV SOLN
0.0125 ug/kg/min | INTRAVENOUS | Status: AC
  Administered 2016-02-27: .1 ug/kg/min via INTRAVENOUS
  Filled 2016-02-27: qty 2000

## 2016-02-27 MED ORDER — FENTANYL CITRATE (PF) 250 MCG/5ML IJ SOLN
INTRAMUSCULAR | Status: AC
  Filled 2016-02-27: qty 5

## 2016-02-27 MED ORDER — EPHEDRINE SULFATE 50 MG/ML IJ SOLN
INTRAMUSCULAR | Status: AC
  Filled 2016-02-27: qty 1

## 2016-02-27 MED ORDER — ROCURONIUM BROMIDE 50 MG/5ML IV SOLN
INTRAVENOUS | Status: AC
  Filled 2016-02-27: qty 1

## 2016-02-27 MED ORDER — ROSUVASTATIN CALCIUM 40 MG PO TABS
40.0000 mg | ORAL_TABLET | Freq: Every evening | ORAL | Status: DC
  Administered 2016-02-27: 40 mg via ORAL
  Filled 2016-02-27: qty 1

## 2016-02-27 MED ORDER — EPHEDRINE SULFATE 50 MG/ML IJ SOLN
INTRAMUSCULAR | Status: DC | PRN
  Administered 2016-02-27 (×5): 10 mg via INTRAVENOUS

## 2016-02-27 MED ORDER — ONDANSETRON HCL 4 MG/2ML IJ SOLN
INTRAMUSCULAR | Status: AC
  Filled 2016-02-27: qty 2

## 2016-02-27 MED ORDER — ONDANSETRON HCL 4 MG/2ML IJ SOLN: 4.0000 mg | Freq: Four times a day (QID) | INTRAMUSCULAR | Status: DC | PRN

## 2016-02-27 MED ORDER — OXYCODONE HCL 5 MG PO TABS
5.0000 mg | ORAL_TABLET | Freq: Four times a day (QID) | ORAL | Status: DC | PRN
  Filled 2016-02-27: qty 1

## 2016-02-27 MED ORDER — POTASSIUM CHLORIDE CRYS ER 20 MEQ PO TBCR: 20.0000 meq | EXTENDED_RELEASE_TABLET | Freq: Every day | ORAL | Status: DC | PRN

## 2016-02-27 MED ORDER — METOPROLOL TARTRATE 1 MG/ML IV SOLN: 2.0000 mg | INTRAVENOUS | Status: DC | PRN

## 2016-02-27 MED ORDER — SUCCINYLCHOLINE CHLORIDE 20 MG/ML IJ SOLN
INTRAMUSCULAR | Status: AC
  Filled 2016-02-27: qty 1

## 2016-02-27 MED ORDER — SODIUM CHLORIDE 0.9 % IJ SOLN
INTRAMUSCULAR | Status: AC
  Filled 2016-02-27: qty 10

## 2016-02-27 MED ORDER — PANTOPRAZOLE SODIUM 40 MG PO TBEC
40.0000 mg | DELAYED_RELEASE_TABLET | Freq: Every day | ORAL | Status: DC
  Administered 2016-02-27 – 2016-02-28 (×2): 40 mg via ORAL
  Filled 2016-02-27 (×2): qty 1

## 2016-02-27 MED ORDER — GUAIFENESIN-DM 100-10 MG/5ML PO SYRP: 15.0000 mL | ORAL_SOLUTION | ORAL | Status: DC | PRN

## 2016-02-27 MED ORDER — BISACODYL 10 MG RE SUPP: 10.0000 mg | Freq: Every day | RECTAL | Status: DC | PRN

## 2016-02-27 MED ORDER — ACETAMINOPHEN 10 MG/ML IV SOLN
1000.0000 mg | Freq: Once | INTRAVENOUS | Status: AC
  Administered 2016-02-27: 1000 mg via INTRAVENOUS

## 2016-02-27 MED ORDER — HEPARIN SODIUM (PORCINE) 1000 UNIT/ML IJ SOLN
INTRAMUSCULAR | Status: AC
  Filled 2016-02-27: qty 1

## 2016-02-27 MED ORDER — MAGNESIUM SULFATE 2 GM/50ML IV SOLN: 2.0000 g | Freq: Every day | INTRAVENOUS | Status: DC | PRN

## 2016-02-27 MED ORDER — GLYCOPYRROLATE 0.2 MG/ML IJ SOLN
INTRAMUSCULAR | Status: DC | PRN
Start: 2016-02-27 — End: 2016-02-27
  Administered 2016-02-27 (×2): 0.2 mg via INTRAVENOUS

## 2016-02-27 MED ORDER — ROCURONIUM BROMIDE 100 MG/10ML IV SOLN
INTRAVENOUS | Status: DC | PRN
  Administered 2016-02-27 (×2): 10 mg via INTRAVENOUS
  Administered 2016-02-27: 50 mg via INTRAVENOUS

## 2016-02-27 MED ORDER — LIDOCAINE HCL (CARDIAC) 20 MG/ML IV SOLN
INTRAVENOUS | Status: DC | PRN
  Administered 2016-02-27: 60 mg via INTRAVENOUS

## 2016-02-27 MED ORDER — NITROGLYCERIN 0.4 MG SL SUBL: 0.4000 mg | SUBLINGUAL_TABLET | SUBLINGUAL | Status: DC | PRN

## 2016-02-27 MED ORDER — PROPOFOL 10 MG/ML IV BOLUS
INTRAVENOUS | Status: AC
  Filled 2016-02-27: qty 20

## 2016-02-27 MED ORDER — LISINOPRIL 20 MG PO TABS
20.0000 mg | ORAL_TABLET | Freq: Every day | ORAL | Status: DC
  Administered 2016-02-28: 20 mg via ORAL
  Filled 2016-02-27: qty 1

## 2016-02-27 MED ORDER — HYDRALAZINE HCL 20 MG/ML IJ SOLN: 5.0000 mg | INTRAMUSCULAR | Status: DC | PRN

## 2016-02-27 MED ORDER — NIACIN ER (ANTIHYPERLIPIDEMIC) 500 MG PO TBCR
2000.0000 mg | EXTENDED_RELEASE_TABLET | Freq: Every day | ORAL | Status: DC
Start: 2016-02-27 — End: 2016-02-28
  Administered 2016-02-27: 2000 mg via ORAL
  Filled 2016-02-27: qty 4

## 2016-02-27 MED ORDER — PHENYLEPHRINE HCL 10 MG/ML IJ SOLN
10.0000 mg | INTRAVENOUS | Status: DC | PRN
  Administered 2016-02-27: 20 ug/min via INTRAVENOUS

## 2016-02-27 MED ORDER — SODIUM CHLORIDE 0.9 % IV SOLN
INTRAVENOUS | Status: DC
  Administered 2016-02-27: 15:00:00 via INTRAVENOUS

## 2016-02-27 MED ORDER — HEMOSTATIC AGENTS (NO CHARGE) OPTIME
TOPICAL | Status: DC | PRN
  Administered 2016-02-27: 1 via TOPICAL

## 2016-02-27 MED ORDER — MIDAZOLAM HCL 2 MG/2ML IJ SOLN
INTRAMUSCULAR | Status: AC
  Filled 2016-02-27: qty 2

## 2016-02-27 MED ORDER — PROTAMINE SULFATE 10 MG/ML IV SOLN
INTRAVENOUS | Status: DC | PRN
  Administered 2016-02-27 (×5): 10 mg via INTRAVENOUS

## 2016-02-27 MED ORDER — PHENYLEPHRINE 40 MCG/ML (10ML) SYRINGE FOR IV PUSH (FOR BLOOD PRESSURE SUPPORT)
PREFILLED_SYRINGE | INTRAVENOUS | Status: AC
  Filled 2016-02-27: qty 10

## 2016-02-27 MED ORDER — ADULT MULTIVITAMIN W/MINERALS CH
1.0000 | ORAL_TABLET | ORAL | Status: DC
  Administered 2016-02-28: 1 via ORAL
  Filled 2016-02-27: qty 1

## 2016-02-27 MED ORDER — LIDOCAINE HCL (CARDIAC) 20 MG/ML IV SOLN
INTRAVENOUS | Status: AC
  Filled 2016-02-27: qty 5

## 2016-02-27 MED ORDER — ACETAMINOPHEN 10 MG/ML IV SOLN
INTRAVENOUS | Status: AC
  Filled 2016-02-27: qty 100

## 2016-02-27 MED ORDER — FINASTERIDE 5 MG PO TABS
5.0000 mg | ORAL_TABLET | Freq: Every evening | ORAL | Status: DC
  Administered 2016-02-27: 5 mg via ORAL
  Filled 2016-02-27: qty 1

## 2016-02-27 MED ORDER — AMIODARONE HCL 200 MG PO TABS
200.0000 mg | ORAL_TABLET | Freq: Every morning | ORAL | Status: DC
  Administered 2016-02-28: 200 mg via ORAL
  Filled 2016-02-27: qty 1

## 2016-02-27 MED ORDER — 0.9 % SODIUM CHLORIDE (POUR BTL) OPTIME
TOPICAL | Status: DC | PRN
  Administered 2016-02-27: 3000 mL

## 2016-02-27 MED ORDER — LABETALOL HCL 5 MG/ML IV SOLN
INTRAVENOUS | Status: DC | PRN
  Administered 2016-02-27: 5 mg via INTRAVENOUS

## 2016-02-27 MED ORDER — SODIUM CHLORIDE 0.9 % IV SOLN: INTRAVENOUS | Status: DC

## 2016-02-27 MED ORDER — NEOSTIGMINE METHYLSULFATE 10 MG/10ML IV SOLN
INTRAVENOUS | Status: DC | PRN
  Administered 2016-02-27: 1.5 mg via INTRAVENOUS

## 2016-02-27 MED ORDER — LABETALOL HCL 5 MG/ML IV SOLN: 10.0000 mg | INTRAVENOUS | Status: DC | PRN

## 2016-02-27 MED ORDER — DEXTROSE 5 % IV SOLN
1.5000 g | INTRAVENOUS | Status: AC
  Administered 2016-02-27: 1.5 g via INTRAVENOUS

## 2016-02-27 MED ORDER — ONDANSETRON HCL 4 MG/2ML IJ SOLN
INTRAMUSCULAR | Status: DC | PRN
  Administered 2016-02-27: 4 mg via INTRAVENOUS

## 2016-02-27 MED ORDER — HYDROMORPHONE HCL 1 MG/ML IJ SOLN: 0.2500 mg | INTRAMUSCULAR | Status: DC | PRN

## 2016-02-27 MED ORDER — LABETALOL HCL 5 MG/ML IV SOLN
INTRAVENOUS | Status: AC
  Filled 2016-02-27: qty 4

## 2016-02-27 MED ORDER — ALUM & MAG HYDROXIDE-SIMETH 200-200-20 MG/5ML PO SUSP: 15.0000 mL | ORAL | Status: DC | PRN

## 2016-02-27 MED ORDER — PHENOL 1.4 % MT LIQD: 1.0000 | OROMUCOSAL | Status: DC | PRN

## 2016-02-27 SURGICAL SUPPLY — 54 items
CANISTER SUCTION 2500CC (MISCELLANEOUS) ×3 IMPLANT
CATH ROBINSON RED A/P 18FR (CATHETERS) ×3 IMPLANT
CATH SUCT 10FR WHISTLE TIP (CATHETERS) ×3 IMPLANT
CLIP TI MEDIUM 6 (CLIP) ×3 IMPLANT
CLIP TI WIDE RED SMALL 24 (CLIP) ×2 IMPLANT
CLIP TI WIDE RED SMALL 6 (CLIP) ×3 IMPLANT
CRADLE DONUT ADULT HEAD (MISCELLANEOUS) ×3 IMPLANT
DRAIN CHANNEL 15F RND FF W/TCR (WOUND CARE) IMPLANT
ELECT REM PT RETURN 9FT ADLT (ELECTROSURGICAL) ×3
ELECTRODE REM PT RTRN 9FT ADLT (ELECTROSURGICAL) ×1 IMPLANT
EVACUATOR SILICONE 100CC (DRAIN) IMPLANT
GAUZE SPONGE 4X4 12PLY STRL (GAUZE/BANDAGES/DRESSINGS) ×3 IMPLANT
GLOVE BIO SURGEON STRL SZ 6.5 (GLOVE) ×2 IMPLANT
GLOVE BIO SURGEONS STRL SZ 6.5 (GLOVE) ×2
GLOVE BIOGEL PI IND STRL 6.5 (GLOVE) IMPLANT
GLOVE BIOGEL PI IND STRL 7.5 (GLOVE) ×1 IMPLANT
GLOVE BIOGEL PI INDICATOR 6.5 (GLOVE) ×6
GLOVE BIOGEL PI INDICATOR 7.5 (GLOVE) ×2
GLOVE SS BIOGEL STRL SZ 7 (GLOVE) IMPLANT
GLOVE SUPERSENSE BIOGEL SZ 7 (GLOVE) ×2
GLOVE SURG SS PI 7.0 STRL IVOR (GLOVE) ×2 IMPLANT
GLOVE SURG SS PI 7.5 STRL IVOR (GLOVE) ×5 IMPLANT
GOWN STRL REUS W/ TWL LRG LVL3 (GOWN DISPOSABLE) ×2 IMPLANT
GOWN STRL REUS W/ TWL XL LVL3 (GOWN DISPOSABLE) ×1 IMPLANT
GOWN STRL REUS W/TWL LRG LVL3 (GOWN DISPOSABLE) ×12
GOWN STRL REUS W/TWL XL LVL3 (GOWN DISPOSABLE) ×6
HEMOSTAT SNOW SURGICEL 2X4 (HEMOSTASIS) ×2 IMPLANT
INSERT FOGARTY SM (MISCELLANEOUS) IMPLANT
KIT BASIN OR (CUSTOM PROCEDURE TRAY) ×3 IMPLANT
KIT ROOM TURNOVER OR (KITS) ×3 IMPLANT
LIQUID BAND (GAUZE/BANDAGES/DRESSINGS) ×3 IMPLANT
NDL HYPO 25GX1X1/2 BEV (NEEDLE) IMPLANT
NEEDLE HYPO 25GX1X1/2 BEV (NEEDLE) IMPLANT
NS IRRIG 1000ML POUR BTL (IV SOLUTION) ×9 IMPLANT
PACK CAROTID (CUSTOM PROCEDURE TRAY) ×3 IMPLANT
PAD ARMBOARD 7.5X6 YLW CONV (MISCELLANEOUS) ×6 IMPLANT
PATCH HEMASHIELD 8X150 (Vascular Products) ×2 IMPLANT
PATCH VASC XENOSURE 1CMX6CM (Vascular Products) IMPLANT
PATCH VASC XENOSURE 1X6 (Vascular Products) IMPLANT
SHUNT CAROTID BYPASS 10 (VASCULAR PRODUCTS) IMPLANT
SHUNT CAROTID BYPASS 12FRX15.5 (VASCULAR PRODUCTS) IMPLANT
SPONGE INTESTINAL PEANUT (DISPOSABLE) ×3 IMPLANT
SUT ETHILON 3 0 PS 1 (SUTURE) IMPLANT
SUT PROLENE 5 0 C 1 24 (SUTURE) ×12 IMPLANT
SUT PROLENE 6 0 BV (SUTURE) ×19 IMPLANT
SUT PROLENE 7 0 BV 1 (SUTURE) ×2 IMPLANT
SUT PROLENE 7 0 BV1 MDA (SUTURE) ×4 IMPLANT
SUT SILK 3 0 TIES 17X18 (SUTURE)
SUT SILK 3-0 18XBRD TIE BLK (SUTURE) IMPLANT
SUT VIC AB 3-0 SH 27 (SUTURE) ×6
SUT VIC AB 3-0 SH 27X BRD (SUTURE) ×2 IMPLANT
SUT VICRYL 4-0 PS2 18IN ABS (SUTURE) ×3 IMPLANT
SYR CONTROL 10ML LL (SYRINGE) IMPLANT
WATER STERILE IRR 1000ML POUR (IV SOLUTION) ×3 IMPLANT

## 2016-02-27 NOTE — Op Note (Signed)
Patient name: ZACARIAS KRAUTER MRN: [id number] DOB: 12-Jan-1946 Sex: male  02/27/2016 Pre-operative Diagnosis: Asymptomatic left carotid stenosis Post-operative diagnosis:  Same Surgeon:  Annamarie Major Assistants:  Jerilynn Som Procedure:   Left carotid endarterectomy with dacryon patch angioplasty Anesthesia:  Gen. Blood Loss:  See anesthesia record Specimens:  none  Findings:  990% stenosis within the internal carotid artery.  The lesion and plaque extended up to the skull base. There was a significant inflammatory reaction around the internal carotid artery Once I was able to get beyond the stenosis I tacked the remaining plaque posteriorly with 7-0 proline sutures.  The hypoglossal nerve had to be fully mobilized.  A shunt could not be placed because of how high the lesion was  Indications:  The patient has been followed for progressive carotid stenosis which is been asymptomatic.  Stenosis is now greater than 80%.  He is here today for endarterectomy.  Procedure:  The patient was identified in the holding area and taken to [facility]  The patient was then placed supine on the table. general anesthesia was administered.  The patient was prepped and draped in the usual sterile fashion.  A time out was called and antibiotics were administered.  An incision was made along the medial border of the sternocleidomastoid.  The platysma muscle was divided with cautery.  The carotid sheath was identified and opened sharply.  The vagus nerve was coursing on the anterior lateral side of the carotid artery this was identified and protected.  I ligated the facial vein between 2-0 silk ties.  The patient had a high bifurcation I continue with distal dissection.  I identified the hypoglossal nerve.  This had to be fully mobilized so that I could get to the distal internal carotid artery.  Upon palpation there was plaque which extended up to the skull base.  There was a significant amount of  inflammatory tissue around the internal carotid artery which made the dissection very difficult.  I created a rent within the internal carotid artery which  Caused me to have to occlude the common internal and external carotid artery prematurely so that I can get hemostasis.  The patient was fully heparinized.  I continue with the dissection of the vessels.  I did not identify the glossopharyngeal nerve but the dissection on the distal internal carotid artery was get this close to the artery as possible.  The artery was dissected out as far as I could possibly get to.  At this point it was occluded with a baby Belenda Cruise.  I also occluded the external carotid with a baby Belenda Cruise and a peripheral DeBakey on the common carotid artery.  A #11 blade was used to make an arteriotomy which was extended longitudinally with Potts scissors.  Approximately one centimeters above the bifurcation there was a 90% stenosis within the internal carotid artery.  The plaque continued posteriorly but there is no significant stenosis beyond this.because of how high the lesion was I could not place a shunt.  In addition the patient did have adequate backbleeding from the internal carotid artery. A Marlene Bast elevator was used to to perform endarterectomy.  I found an adequate endpoint beyond the stenosis in the internal carotid artery.  I did not feel that I could get all of the posterior plaque out because of how high it went therefore once I found a good end point I tacked the remaining plaque down with 7-0 Prolene.  The endarterectomized  bed was copiously irrigated and all potential embolic debris was removed.  I selected a dacryon patch and performed patch angioplasty with running 6-0 Prolene.  Prior to completion the appropriate flushing maneuvers were performed and the anastomosis was completed.  Several repair stitches were required for hemostasis.  Sequential blood flow was reestablished first by removing the clamp to the external  carotid artery, followed by the common carotid artery, and lastly internal carotid artery.  Hand-held Doppler was used to evaluate the signals in all 3 arteries which were appropriate.  50 mg of protamine was then given.  Once hemostasis was satisfactory, the carotid sheath was reapproximated with 3-0 Vicryl, the platysma was closed with 3-0 Vicryl, and the skin was closed with 4-0 Vicryl followed by Dermabond.  The patient was successfully extubated and was taken to the recovery room in stable condition.  He was neurologically intact.   Disposition:  To PACU in stable condition.   Theotis Burrow, M.D. [facility] [place] [facility] Office: [phone] Pager:  [phone]

## 2016-02-27 NOTE — Progress Notes (Addendum)
  Day of Surgery Note    Subjective:  "I feel terrific"    Filed Vitals:   02/27/16 1131 02/27/16 1140  BP: 189/95   Pulse: 71   Temp:  99.4 F (37.4 C)  Resp: 18     Incisions:   Clean and dry with mild fullness Extremities:  Moving all extremities equally Cardiac:  regular Lungs:  Non labored Neuro:  In tact; tongue midline; mild asymmetry of smile   Assessment/Plan:  This is a 70 y.o. male who is s/p left carotid endarterectomy  -pt is neurologically intact -continue blood pressure control-not requiring any gtts at this time.  PTA hypertensive medication re-ordered -hold Plavix/coumadin at this time -to [facility] when bed available   Leontine Locket, PA-C 02/27/2016 12:03 PM

## 2016-02-27 NOTE — Transfer of Care (Signed)
Immediate Anesthesia Transfer of Care Note  Patient: Charles Robinson  Procedure(s) Performed: Procedure(s): LEFT  CAROTID ARTERY ENDARTERECTOMY  (Left) WITH HEMASHIELD DACRON  PATCH ANGIOPLASTY (Left)  Patient Location: PACU  Anesthesia Type:General  Level of Consciousness: awake, alert , oriented and patient cooperative  Airway & Oxygen Therapy: Patient Spontanous Breathing and Patient connected to nasal cannula oxygen  Post-op Assessment: Report given to RN and Post -op Vital signs reviewed and stable  Post vital signs: Reviewed  Last Vitals:  Filed Vitals:   02/27/16 0613  BP: 164/75  Pulse: 52  Temp: 36.8 C  Resp: 20    Complications: No apparent anesthesia complications

## 2016-02-27 NOTE — Progress Notes (Signed)
Pharmacy Consult: Antibiotics renal dose adjustment  61 YOM s/p L carotid artery endarterectomy, getting zinacef for post-op prophylaxis. Pharmacy is consulted for antibiotics renal dose adjustment. Scr 1.48 on 3/31, est. crcl 60 ml/min. He received pre-op dose at 0750.  Plan: Zinacef 1.5 g IV Q 12 hrs x 2 doses as ordered, next dose at 2000. no adjustment needed. Pharmacy sign off.   Thanks.  Maryanna Shape, PharmD, BCPS  Clinical Pharmacist  Pager: [phone]

## 2016-02-27 NOTE — Anesthesia Preprocedure Evaluation (Addendum)
Anesthesia Evaluation  Patient identified by MRN, date of birth, ID band Patient awake    Reviewed: Allergy & Precautions, NPO status , Patient's Chart, lab work & pertinent test results  History of Anesthesia Complications Negative for: history of anesthetic complications  Airway Mallampati: II  TM Distance: >3 FB Neck ROM: Full    Dental  (+) Teeth Intact, Dental Advisory Given   Pulmonary neg pulmonary ROS,    breath sounds clear to auscultation       Cardiovascular hypertension, Pt. on medications + CAD, + Cardiac Stents and + Peripheral Vascular Disease   Rhythm:Regular Rate:Normal     Neuro/Psych negative neurological ROS  negative psych ROS   GI/Hepatic GERD  ,  Endo/Other  Morbid obesity  Renal/GU Renal InsufficiencyRenal disease     Musculoskeletal  (+) Arthritis ,   Abdominal (+) + obese,   Peds  Hematology   Anesthesia Other Findings   Reproductive/Obstetrics                           Anesthesia Physical Anesthesia Plan  ASA: III  Anesthesia Plan: General   Post-op Pain Management:    Induction: Intravenous  Airway Management Planned: Oral ETT  Additional Equipment: Arterial line  Intra-op Plan:   Post-operative Plan: Extubation in OR  Informed Consent: I have reviewed the patients History and Physical, chart, labs and discussed the procedure including the risks, benefits and alternatives for the proposed anesthesia with the patient or authorized representative who has indicated his/her understanding and acceptance.   Dental advisory given  Plan Discussed with: CRNA and Surgeon  Anesthesia Plan Comments:         Anesthesia Quick Evaluation

## 2016-02-27 NOTE — Anesthesia Procedure Notes (Signed)
Procedure Name: Intubation Date/Time: 02/27/2016 7:50 AM Performed by: Jenne Campus Pre-anesthesia Checklist: Patient identified, Emergency Drugs available, Suction available, Patient being monitored and Timeout performed Patient Re-evaluated:Patient Re-evaluated prior to inductionOxygen Delivery Method: Circle system utilized Preoxygenation: Pre-oxygenation with 100% oxygen Intubation Type: IV induction Ventilation: Mask ventilation without difficulty, Oral airway inserted - appropriate to patient size and Two handed mask ventilation required Laryngoscope Size: Miller and 3 Grade View: Grade II Tube type: Oral Tube size: 7.5 mm Number of attempts: 1 Airway Equipment and Method: Stylet Placement Confirmation: ETT inserted through vocal cords under direct vision,  CO2 detector,  positive ETCO2 and breath sounds checked- equal and bilateral Secured at: 23 cm Tube secured with: Tape Dental Injury: Teeth and Oropharynx as per pre-operative assessment

## 2016-02-27 NOTE — H&P (View-Only) (Signed)
Patient name: Charles Robinson MRN: [id number] DOB: 05-10-1946 Sex: male   Referred by: Dr. Gwenlyn Found  Reason for referral:  Chief Complaint  Patient presents with  . New Evaluation    referred by Dr. Gwenlyn Found for Left ICA Stenosis    HISTORY OF PRESENT ILLNESS: This is a 70 year old gentleman that is referred today for evaluation of left carotid stenosis.  This was detected with a bruit.  He is asymptomatic.  Specifically, he denies numbness or weakness in either extremity.  He denies slurred speech.  He denies amaurosis fugax.  He has a history of ischemic heart disease, status post LAD stent placement in 2004.  He had circumflex and obtuse marginal branch interventions in 2012.  He also suffers from paroxysmal atrial fibrillation, maintained in sinus rhythm on Coumadin and amiodarone.  He suffers from hypertension which is managed with an ACE inhibitor.  His hypercholesterolemia is treated with a statin.  He is a nonsmoker  Past Medical History  Diagnosis Date  . History of colon polyps   . Hyperlipidemia   . Hypertension   . BPH (benign prostatic hypertrophy)   . Cancer ([place])     SKIN CA  . Paroxysmal atrial fibrillation [facility])     followed by Dr. Glenetta Hew; s/p DCCV 2011, 07/2011 post PCI with Type 4a MI  . Rupture quadriceps tendon   . Coronary artery disease 03/15/2012    s/p PCI to LAD, to RCA, then extensive PCI to LCx-OM1  (enitre prox-AVG Circ for dissection in 07/2011)  . Chest pain 08/16/2011    Echo - EF 60-65%; moderate LV concentric hypertrophy; abdnormal LV relaxation (grade 1 diastolic dysfunction; ascending aorta mildly dilated; LA moderately dilated;   . Atrial flutter ([place]) 08/20/2011    TEE- atrial septum - no defect identified; RA normal in size, no evidence of thrombus; ascending aorta normal  . PAF (paroxysmal atrial fibrillation) ([place]) 12/20/2012    R/L CL - EF 61%; normal EKG, no motion artifact, no evidence of ischemia, normal BP response, low risk stress  nuclear study  . Bruit 08/01/2009    doppler - no suggestion of significant diameter reduction, dissection or aneurysmal dilation.  normal evaluation  . GERD (gastroesophageal reflux disease)   . Arthritis     back    Past Surgical History  Procedure Laterality Date  . Coronary stent placement  2003 - 2011 - 2012 - 2013    x 8 stents (per pt)  . Coronary angioplasty with stent placement  03/15/2012    multiple stens in [place]  . Back surgery    . Cardiac catheterization  03/15/2012    patent LAD stents, patent LCx stents w/80% focal ISR just distal to OM; OM proximal stent open; 2 site PTCA w/ PTCA and stenting of OM2 ang PTCA of distal stent ISR followed by PTCA of mid LCx ISR  . Quadriceps tendon repair  09/19/2012    Procedure: REPAIR QUADRICEP TENDON;  Surgeon: [name], MD;  Location: [facility] ORS;  Service: Orthopedics;  Laterality: Left;  . Nm myoview ltd  11/2012    No ischemia or infarction, normal EF & WM  . Doppler echocardiography  07/2011    EF ~60-65%, Grade 1 D Dysfxn; mod Conc LVH  . Left heart catheterization with coronary angiogram N/A 03/15/2012    Procedure: LEFT HEART CATHETERIZATION WITH CORONARY ANGIOGRAM;  Surgeon: [name], MD;  Location: [facility] CATH LAB;  Service: Cardiovascular;  Laterality:  N/A;  . Lumbar laminectomy/decompression microdiscectomy Bilateral 12/11/2014    Procedure: Bilateral Lumbar Three-Four Laminectomy;  Surgeon: Kristeen Miss, MD;  Location: [place] NEURO ORS;  Service: Neurosurgery;  Laterality: Bilateral;  bilateral    Social History   Social History  . Marital Status: Married    Spouse Name: N/A  . Number of Children: N/A  . Years of Education: N/A   Occupational History  . Not on file.   Social History Main Topics  . Smoking status: Never Smoker   . Smokeless tobacco: Never Used  . Alcohol Use: 1.2 oz/week    2 Cans of beer per week     Comment: rare  . Drug Use: No  . Sexual Activity: Not Currently   Other Topics  Concern  . Not on file   Social History Narrative   Charles Robinson is a father of 2, grandfather of 77.  His daughter was classmates with Dr. Ellyn Hack in college.   He and his wife are currently in the process of moving houses, further down the street in order to downsize to a single-story house.  He has been working out at [employer].          Family History  Problem Relation Age of Onset  . Heart disease Father     Allergies as of 02/10/2016 - Review Complete 02/10/2016  Allergen Reaction Noted  . Lipitor [atorvastatin] Other (See Comments) 11/08/2012  . Morphine and related Nausea And Vomiting 09/16/2012  . Vicodin [hydrocodone-acetaminophen] Other (See Comments) 09/16/2012    Current Outpatient Prescriptions on File Prior to Visit  Medication Sig Dispense Refill  . amiodarone (PACERONE) 200 MG tablet TAKE 1 TABLET EVERY DAY 90 tablet 2  . clopidogrel (PLAVIX) 75 MG tablet TAKE 1 TABLET (75 MG TOTAL) BY MOUTH DAILY. 90 tablet 3  . Coenzyme Q10 200 MG capsule Take 200 mg by mouth daily.    [name] doxazosin (CARDURA) 8 MG tablet Take 4 mg by mouth every evening.     . finasteride (PROSCAR) 5 MG tablet Take 5 mg by mouth daily.    [name] lisinopril (PRINIVIL,ZESTRIL) 20 MG tablet TAKE 1 TABLET (20 MG TOTAL) BY MOUTH DAILY. 30 tablet 11  . metoprolol succinate (TOPROL-XL) 25 MG 24 hr tablet Take 1 tablet (25 mg total) by mouth 2 (two) times daily. 180 tablet 3  . Multiple Vitamin (MULTIVITAMIN WITH MINERALS) TABS tablet Take 1 tablet by mouth daily.    . niacin (NIASPAN) 1000 MG CR tablet Take 2 tablets (2,000 mg total) by mouth at bedtime. 60 tablet 11  . nitroGLYCERIN (NITROSTAT) 0.4 MG SL tablet Place 1 tablet (0.4 mg total) under the tongue every 5 (five) minutes as needed for chest pain. 25 tablet 1  . Omega-3 Fatty Acids (FISH OIL) 1000 MG CAPS Take 1,000 capsules by mouth 2 (two) times daily.     . rosuvastatin (CRESTOR) 40 MG tablet Take 1 tablet (40 mg total) by mouth daily. 90 tablet 3  .  warfarin (COUMADIN) 5 MG tablet Take 1 tablet by mouth daily or as directed by coumadin clinic 90 tablet 1   No current facility-administered medications on file prior to visit.     REVIEW OF SYSTEMS: Cardiovascular: No chest pain, chest pressure, palpitations, orthopnea, or dyspnea on exertion. No claudication or rest pain,  No history of DVT or phlebitis. Pulmonary: No productive cough, asthma or wheezing. Neurologic: No weakness, paresthesias, aphasia, or amaurosis. No dizziness. Hematologic: No bleeding problems or clotting disorders. Musculoskeletal: No  joint pain or joint swelling. Gastrointestinal: No blood in stool or hematemesis Genitourinary: No dysuria or hematuria. Psychiatric:: No history of major depression. Integumentary: No rashes or ulcers. Constitutional: No fever or chills.  PHYSICAL EXAMINATION:  Filed Vitals:   02/10/16 1312 02/10/16 1314 02/10/16 1318  BP: 162/79 151/68 133/71  Pulse: 66    Temp: 97.6 F (36.4 C)    TempSrc: Oral    Resp: 20    Height: 6' 0.2" (1.834 m)    Weight: 275 lb (124.739 kg)     Body mass index is 37.09 kg/(m^2). General: The patient appears their stated age.   HEENT:  No gross abnormalities Pulmonary: Respirations are non-labored Abdomen: Soft and non-tender  Musculoskeletal: There are no major deformities.   Neurologic: No focal weakness or paresthesias are detected, Skin: There are no ulcer or rashes noted. Psychiatric: The patient has normal affect. Cardiovascular: There is a regular rate and rhythm without significant murmur appreciated.Positive carotid bruit  Diagnostic Studies: I have reviewed the ultrasound studies which show 80-99 percent left carotid stenosis and 0-39 percent right carotid stenosis.  The bifurcation is in the mid neck and the artery is normal past the stenosis.   Assessment:  Asymptomatic left carotid stenosis Plan: We discussed proceeding with left carotid endarterectomy.  I discussed the  risks and benefits of the operation including but not limited to the risk of cardiopulmonary complications, stroke, nerve injury, and bleeding.  I will ask Dr. Ellyn Hack regarding discontinuation of Plavix and Coumadin 5 days prior to his operation.  I will start him on a baby aspirin at that time and resume Coumadin and Plavix once he has recovered from his operation.  This is been scheduled for Thursday, April 6     V. Leia Alf, M.D. [facility] [place] Office: [phone] Pager:  [phone]

## 2016-02-27 NOTE — Interval H&P Note (Signed)
History and Physical Interval Note:  02/27/2016 7:28 AM  Charles Robinson  has presented today for surgery, with the diagnosis of Left carotid artery stenosis I65.22  The various methods of treatment have been discussed with the patient and family. After consideration of risks, benefits and other options for treatment, the patient has consented to  Procedure(s): ENDARTERECTOMY CAROTID (Left) as a surgical intervention .  The patient's history has been reviewed, patient examined, no change in status, stable for surgery.  I have reviewed the patient's chart and labs.  Questions were answered to the patient's satisfaction.     Annamarie Major

## 2016-02-28 ENCOUNTER — Encounter (HOSPITAL_COMMUNITY): Payer: Self-pay | Admitting: Surgery

## 2016-02-28 LAB — CBC
HCT: 36.9 % — ABNORMAL LOW (ref 39.0–52.0)
Hemoglobin: 12.1 g/dL — ABNORMAL LOW (ref 13.0–17.0)
MCH: 30.3 pg (ref 26.0–34.0)
MCHC: 32.8 g/dL (ref 30.0–36.0)
MCV: 92.5 fL (ref 78.0–100.0)
Platelets: 126 10*3/uL — ABNORMAL LOW (ref 150–400)
RBC: 3.99 MIL/uL — ABNORMAL LOW (ref 4.22–5.81)
RDW: 13.2 % (ref 11.5–15.5)
WBC: 5.7 10*3/uL (ref 4.0–10.5)

## 2016-02-28 LAB — BASIC METABOLIC PANEL
Anion gap: 8 (ref 5–15)
BUN: 12 mg/dL (ref 6–20)
CO2: 24 mmol/L (ref 22–32)
Calcium: 8.1 mg/dL — ABNORMAL LOW (ref 8.9–10.3)
Chloride: 105 mmol/L (ref 101–111)
Creatinine, Ser: 1.35 mg/dL — ABNORMAL HIGH (ref 0.61–1.24)
GFR calc Af Amer: 59 mL/min — ABNORMAL LOW (ref >60)
GFR calc non Af Amer: 51 mL/min — ABNORMAL LOW (ref >60)
Glucose, Bld: 128 mg/dL — ABNORMAL HIGH (ref 65–99)
Potassium: 4.1 mmol/L (ref 3.5–5.1)
Sodium: 137 mmol/L (ref 135–145)

## 2016-02-28 MED ORDER — TRAMADOL HCL 50 MG PO TABS: 50.0000 mg | ORAL_TABLET | Freq: Four times a day (QID) | ORAL | Status: DC | PRN

## 2016-02-28 NOTE — Progress Notes (Signed)
Explained and discussed discharge instructions to patient and wife. Prescription, and follow up visit given. Patient going home with wife and with belongings. No complaints at this time.

## 2016-02-28 NOTE — Progress Notes (Signed)
Pt ambulated approximately 300 feet around unit.  Pt's HR, RR and oxygen saturation remained WNL.  Pt denied pain or dizziness while ambulating.  Pt is now sitting in the chair. Will continue to monitor.

## 2016-02-28 NOTE — Progress Notes (Addendum)
  Progress Note    02/28/2016 7:23 AM 1 Day Post-Op  Subjective:  "I don't have any pain".  States his swallowing is better today.  Doesn't feel as if he is choking, but sore throat.  Has not eaten any solid foods yet.  Did have a mild headache, but resolved with Tylenol.  Feels like he has a fat lip on the left lower lip.  Tm 99.3 now afebrile HR 70's-80's NSR 530'Y-511'M systolic 21% RA  Gtts:  none  Filed Vitals:   02/28/16 0500 02/28/16 0706  BP: 137/66   Pulse: 73   Temp:  98.2 F (36.8 C)  Resp: 14      Physical Exam: Neuro:  In tact; tongue midline; mild marginal mandibular palsy; moving all extremities equally Lungs:  Non labored Incision:  Clean and dry with mild fullness  CBC    Component Value Date/Time   WBC 5.7 02/28/2016 0335   RBC 3.99* 02/28/2016 0335   HGB 12.1* 02/28/2016 0335   HCT 36.9* 02/28/2016 0335   PLT 126* 02/28/2016 0335   MCV 92.5 02/28/2016 0335   MCH 30.3 02/28/2016 0335   MCHC 32.8 02/28/2016 0335   RDW 13.2 02/28/2016 0335   LYMPHSABS 1.4 01/28/2012 1111   MONOABS 0.4 01/28/2012 1111   EOSABS 0.0 01/28/2012 1111   BASOSABS 0.0 01/28/2012 1111    BMET    Component Value Date/Time   NA 137 02/28/2016 0335   K 4.1 02/28/2016 0335   CL 105 02/28/2016 0335   CO2 24 02/28/2016 0335   GLUCOSE 128* 02/28/2016 0335   GLUCOSE 103* 09/06/2006 0902   BUN 12 02/28/2016 0335   CREATININE 1.35* 02/28/2016 0335   CREATININE 1.39* 08/02/2015 1125   CALCIUM 8.1* 02/28/2016 0335   GFRNONAA 51* 02/28/2016 0335   GFRAA 59* 02/28/2016 0335     Intake/Output Summary (Last 24 hours) at 02/28/16 0723 Last data filed at 02/28/16 1173  Gross per 24 hour  Intake 3242.5 ml  Output   2075 ml  Net 1167.5 ml     Assessment/Plan:  This is a 70 y.o. male who is s/p left CEA 1 Day Post-Op  -pt is doing well this am. -pt neuro exam is in tact; he states he had some difficulty swallowing yesterday and it sounds as if it is from a sore throat and  is improved this morning.  He has not had solid foods yet-we will see how he does with breakfast. -he did have a mild headache-this did improve with Tylenol -marginal mandibular palsy, which should improve over time -possibly home later today if pt is able to eat okay. -pt has ambulated -pt has voided -f/u with [name] in 2 weeks.   Leontine Locket, PA-C [facility] [phone]

## 2016-02-28 NOTE — Discharge Summary (Signed)
Discharge Summary     Charles Robinson 1946-03-31 70 y.o. male  [id number]  Admission Date: 02/27/2016  Discharge Date: 02/28/16  Physician: Serafina Mitchell, MD  Admission Diagnosis: Left carotid artery stenosis I65.22   HPI:   This is a 70 y.o. male that is referred today for evaluation of left carotid stenosis. This was detected with a bruit. He is asymptomatic. Specifically, he denies numbness or weakness in either extremity. He denies slurred speech. He denies amaurosis fugax.  He has a history of ischemic heart disease, status post LAD stent placement in 2004. He had circumflex and obtuse marginal branch interventions in 2012. He also suffers from paroxysmal atrial fibrillation, maintained in sinus rhythm on Coumadin and amiodarone.  He suffers from hypertension which is managed with an ACE inhibitor. His hypercholesterolemia is treated with a statin. He is a nonsmoker  Hospital Course:  The patient was admitted to the hospital and taken to the operating room on 02/27/2016 and underwent left carotid endarterectomy.  Intraoperative findings included 99% stenosis within the internal carotid artery. The lesion and plaque extended up to the skull base. There was a significant inflammatory reaction around the internal carotid artery Once I was able to get beyond the stenosis I tacked the remaining plaque posteriorly with 7-0 proline sutures. The hypoglossal nerve had to be fully mobilized. A shunt could not be placed because of how high the lesion was.  The pt tolerated the procedure well and was transported to the PACU in good condition.   By POD 1, the pt neuro status his neuro status was in tact.He did have a sore throat, but no difficulty swallowing.  He had a mild HA post operatively, but this resolved with Tylenol.   Coumadin and Plavix were restarted on POD 1.   The remainder of the hospital course consisted of increasing mobilization and increasing intake of solids  without difficulty.    Recent Labs  02/28/16 0335  NA 137  K 4.1  CL 105  CO2 24  GLUCOSE 128*  BUN 12  CALCIUM 8.1*    Recent Labs  02/28/16 0335  WBC 5.7  HGB 12.1*  HCT 36.9*  PLT 126*    Recent Labs  02/27/16 0604  INR 1.11     Discharge Instructions    CAROTID Sugery: Call MD for difficulty swallowing or speaking; weakness in arms or legs that is a new symtom; severe headache.  If you have increased swelling in the neck and/or  are having difficulty breathing, CALL 911    Complete by:  As directed      Call MD for:  redness, tenderness, or signs of infection (pain, swelling, bleeding, redness, odor or green/yellow discharge around incision site)    Complete by:  As directed      Call MD for:  severe or increased pain, loss or decreased feeling  in affected limb(s)    Complete by:  As directed      Call MD for:  temperature >100.5    Complete by:  As directed      Discharge wound care:    Complete by:  As directed   Shower daily with soap and water starting 02/29/16     Driving Restrictions    Complete by:  As directed   No driving for 2 weeks     Lifting restrictions    Complete by:  As directed   No lifting for 2 weeks     Resume previous diet  Complete by:  As directed            Discharge Diagnosis:  Left carotid artery stenosis I65.22  Secondary Diagnosis: Patient Active Problem List   Diagnosis Date Noted  . Left carotid artery stenosis 02/10/2016  . Left carotid bruit 11/19/2015  . Chronic renal insufficiency, stage III (moderate) 11/19/2015  . 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 Encounter for pre-operative cardiovascular clearance 11/13/2014  . Obesity (BMI 30-39.9) 05/03/2014  . On amiodarone therapy 05/03/2014  . Long term current use of anticoagulant therapy 04/21/2013  . Hyponatremia 09/21/2012  . Expected blood loss anemia 09/20/2012  . Obese 09/20/2012  . S/P left quad tendon repair 09/19/2012  . CAD,  s/p multiple PCIs     Class: Chronic  . PAF- NSR on Amiodarone 01/13/2011    Class: History of  . EUSTACHIAN TUBE DYSFUNCTION, LEFT 01/30/2008  . BENIGN PROSTATIC HYPERTROPHY, WITH URINARY OBSTRUCTION 10/24/2007  . Dyslipidemia 06/24/2007    Class: Chronic  . Essential hypertension 06/24/2007    Class: Diagnosis of  . COLONIC POLYPS, HX OF 06/24/2007   Past Medical History  Diagnosis Date  . History of colon polyps   . Hyperlipidemia   . Hypertension   . BPH (benign prostatic hypertrophy)   . Cancer ([place])     SKIN CA  . Paroxysmal atrial fibrillation [facility])     followed by Dr. Glenetta Hew; s/p DCCV 2011, 07/2011 post PCI with Type 4a MI  . Rupture quadriceps tendon   . Coronary artery disease 03/15/2012    s/p PCI to LAD, to RCA, then extensive PCI to LCx-OM1  (enitre prox-AVG Circ for dissection in 07/2011)  . Chest pain 08/16/2011    Echo - EF 60-65%; moderate LV concentric hypertrophy; abdnormal LV relaxation (grade 1 diastolic dysfunction; ascending aorta mildly dilated; LA moderately dilated;   . Atrial flutter ([place]) 08/20/2011    TEE- atrial septum - no defect identified; RA normal in size, no evidence of thrombus; ascending aorta normal  . PAF (paroxysmal atrial fibrillation) ([place]) 12/20/2012    R/L CL - EF 61%; normal EKG, no motion artifact, no evidence of ischemia, normal BP response, low risk stress nuclear study  . Bruit 08/01/2009    doppler - no suggestion of significant diameter reduction, dissection or aneurysmal dilation.  normal evaluation  . GERD (gastroesophageal reflux disease)   . Arthritis     back      Medication List    TAKE these medications        amiodarone 200 MG tablet  Commonly known as:  PACERONE  TAKE 1 TABLET EVERY DAY     clopidogrel 75 MG tablet  Commonly known as:  PLAVIX  TAKE 1 TABLET (75 MG TOTAL) BY MOUTH DAILY.     Coenzyme Q10 200 MG capsule  Take 200 mg by mouth every morning.     doxazosin 8 MG tablet  Commonly known as:   CARDURA  Take 4 mg by mouth every evening.     finasteride 5 MG tablet  Commonly known as:  PROSCAR  Take 5 mg by mouth every evening.     Fish Oil 1000 MG Caps  Take 1,000 capsules by mouth 2 (two) times daily.     lisinopril 20 MG tablet  Commonly known as:  PRINIVIL,ZESTRIL  TAKE 1 TABLET (20 MG TOTAL) BY MOUTH DAILY.     metoprolol succinate 25 MG 24 hr tablet  Commonly known as:  TOPROL-XL  Take 1  tablet (25 mg total) by mouth 2 (two) times daily.     multivitamin with minerals Tabs tablet  Take 1 tablet by mouth every morning.     niacin 1000 MG CR tablet  Commonly known as:  NIASPAN  Take 2 tablets (2,000 mg total) by mouth at bedtime.     nitroGLYCERIN 0.4 MG SL tablet  Commonly known as:  NITROSTAT  Place 1 tablet (0.4 mg total) under the tongue every 5 (five) minutes as needed for chest pain.     rosuvastatin 40 MG tablet  Commonly known as:  CRESTOR  Take 1 tablet (40 mg total) by mouth daily.     traMADol 50 MG tablet  Commonly known as:  ULTRAM  Take 1 tablet (50 mg total) by mouth every 6 (six) hours as needed.     warfarin 5 MG tablet  Commonly known as:  COUMADIN  Take 1 tablet by mouth daily or as directed by coumadin clinic        Prescriptions given: Tramadol #20 No Refill  Instructions: 1.  Shower daily with soap and water starting 02/29/16  Disposition: home  Patient's condition: is Good  Follow up: 1. [name] in 2 weeks. 2. Coumadin check 03/03/16  Leontine Locket, PA-C [facility] [phone]  --- For [facility] use --- Instructions: Press F2 to tab through selections.  Delete question if not applicable.   Modified Rankin score at D/C (0-6): 0  IV medication needed for:  1. Hypertension: No 2. Hypotension: No  Post-op Complications: No  1. Post-op CVA or TIA: No  If yes: Event classification (right eye, left eye, right cortical, left cortical, verterobasilar, other): n/a  If yes: Timing of event  (intra-op, <6 hrs post-op, >=6 hrs post-op, unknown): n/a  2. CN injury: No  If yes: CN n/a injuried   3. Myocardial infarction: No  If yes: Dx by (EKG or clinical, Troponin): n/a  4.  CHF: No  5.  Dysrhythmia (new): No  6. Wound infection: No  7. Reperfusion symptoms: No  8. Return to OR: No  If yes: return to OR for (bleeding, neurologic, other CEA incision, other): n/a  Discharge medications: Statin use:  Yes If No: '[ ]'$  For Medical reasons, '[ ]'$  Non-compliant, '[ ]'$  Not-indicated ASA use:  No  If No: '[ ]'$  For Medical reasons, '[ ]'$  Non-compliant, '[ ]'$  Not-indicated Beta blocker use:  Yes If No: '[ ]'$  For Medical reasons, '[ ]'$  Non-compliant, '[ ]'$  Not-indicated ACE-Inhibitor use:  Yes If No: '[ ]'$  For Medical reasons, '[ ]'$  Non-compliant, '[ ]'$  Not-indicated ARB use:  No P2Y12 Antagonist use: Yes, [x ] Plavix, '[ ]'$  Plasugrel, '[ ]'$  Ticlopinine, '[ ]'$  Ticagrelor, '[ ]'$  Other, '[ ]'$  No for medical reason, '[ ]'$  Non-compliant, '[ ]'$  Not-indicated Anti-coagulant use:  Yes, [x ] Warfarin, '[ ]'$  Rivaroxaban, '[ ]'$  Dabigatran, '[ ]'$  Other, '[ ]'$  No for medical reason, '[ ]'$  Non-compliant, '[ ]'$  Not-indicated

## 2016-02-29 ENCOUNTER — Telehealth: Payer: Self-pay | Admitting: Surgery

## 2016-02-29 NOTE — Telephone Encounter (Signed)
sched appt 4/24 at 3:30. Lm on hm# to inform pt of appt.

## 2016-02-29 NOTE — Telephone Encounter (Signed)
-----   Message from Mena Goes, RN sent at 02/27/2016 12:33 PM EDT ----- Regarding: schedule   ----- Message -----    From: Gabriel Earing, PA-C    Sent: 02/27/2016  12:09 PM      To: [facility] Charge Pool  S/p left CEA 02/27/16.  F/u with [name] in 2 weeks.  Thanks, Aldona Bar

## 2016-03-04 ENCOUNTER — Encounter: Payer: Self-pay | Admitting: Surgery

## 2016-03-06 NOTE — Anesthesia Postprocedure Evaluation (Signed)
Anesthesia Post Note  Patient: Charles Robinson  Procedure(s) Performed: Procedure(s) (LRB): LEFT  CAROTID ARTERY ENDARTERECTOMY  (Left) WITH HEMASHIELD DACRON  PATCH ANGIOPLASTY (Left)  Patient location during evaluation: PACU Anesthesia Type: General Level of consciousness: awake and alert Pain management: pain level controlled Vital Signs Assessment: post-procedure vital signs reviewed and stable Respiratory status: spontaneous breathing, nonlabored ventilation, respiratory function stable and patient connected to nasal cannula oxygen Cardiovascular status: blood pressure returned to baseline and stable Postop Assessment: no signs of nausea or vomiting Anesthetic complications: no    Last Vitals:  Filed Vitals:   02/28/16 0500 02/28/16 0706  BP: 137/66 135/72  Pulse: 73   Temp:  36.8 C  Resp: 14 19    Last Pain:  Filed Vitals:   02/28/16 0758  PainSc: 0-No pain                 Sabrena Gavitt,JAMES TERRILL

## 2016-03-16 ENCOUNTER — Telehealth: Payer: Self-pay | Admitting: Pharmacist

## 2016-03-16 ENCOUNTER — Encounter: Payer: Self-pay | Admitting: Surgery

## 2016-03-16 ENCOUNTER — Ambulatory Visit (INDEPENDENT_AMBULATORY_CARE_PROVIDER_SITE_OTHER): Payer: Medicare Other | Admitting: Pharmacist

## 2016-03-16 ENCOUNTER — Ambulatory Visit (INDEPENDENT_AMBULATORY_CARE_PROVIDER_SITE_OTHER): Payer: Medicare Other | Admitting: Surgery

## 2016-03-16 VITALS — BP 113/68 | HR 69 | Temp 98.6°F | Ht 75.0 in | Wt 264.0 lb

## 2016-03-16 VITALS — BP 122/80

## 2016-03-16 DIAGNOSIS — I4891 Unspecified atrial fibrillation: Secondary | ICD-10-CM | POA: Diagnosis not present

## 2016-03-16 DIAGNOSIS — Z7901 Long term (current) use of anticoagulants: Secondary | ICD-10-CM

## 2016-03-16 DIAGNOSIS — I10 Essential (primary) hypertension: Secondary | ICD-10-CM | POA: Diagnosis not present

## 2016-03-16 DIAGNOSIS — I6522 Occlusion and stenosis of left carotid artery: Secondary | ICD-10-CM

## 2016-03-16 LAB — POCT INR: INR: 2.9

## 2016-03-16 MED ORDER — LISINOPRIL 10 MG PO TABS: 10.0000 mg | ORAL_TABLET | Freq: Every day | ORAL | Status: DC

## 2016-03-16 NOTE — Progress Notes (Signed)
Patient name: Charles Robinson MRN: [id number] DOB: [birth year] Sex: male  REASON FOR VISIT: Postoperative follow-up  HPI: Charles Robinson is a 70 y.o. male who is status post left carotid endarterectomy with dacryon patch angioplasty on 02/27/2016.  This was done for asymptomatic stenosis.  Intraoperatively, the patient had a 90% stenosis within the internal carotid artery.  The lesion and plaque extended up to the skull base.  There was also a significant inflammatory reaction around the internal carotid artery I could not place a shunt because of how high the lesion was.  The patient did well following surgery and was discharged home on postoperative day 1.  He does have some mild swallowing difficulty but this is getting better.  No other neurologic deficits  Current Outpatient Prescriptions  Medication Sig Dispense Refill  . amiodarone (PACERONE) 200 MG tablet TAKE 1 TABLET EVERY DAY (Patient taking differently: TAKE 1 TABLET EVERY morning) 90 tablet 2  . clopidogrel (PLAVIX) 75 MG tablet TAKE 1 TABLET (75 MG TOTAL) BY MOUTH DAILY. (Patient taking differently: TAKE 1 TABLET (75 MG TOTAL) BY MOUTH every morning) 90 tablet 3  . Coenzyme Q10 200 MG capsule Take 200 mg by mouth every morning.     [name] doxazosin (CARDURA) 8 MG tablet Take 4 mg by mouth every evening.     . finasteride (PROSCAR) 5 MG tablet Take 5 mg by mouth every evening.     [name] lisinopril (PRINIVIL,ZESTRIL) 10 MG tablet Take 1 tablet (10 mg total) by mouth daily. 90 tablet 3  . metoprolol succinate (TOPROL-XL) 25 MG 24 hr tablet Take 1 tablet (25 mg total) by mouth 2 (two) times daily. 180 tablet 3  . Multiple Vitamin (MULTIVITAMIN WITH MINERALS) TABS tablet Take 1 tablet by mouth every morning.     . niacin (NIASPAN) 1000 MG CR tablet Take 2 tablets (2,000 mg total) by mouth at bedtime. 60 tablet 11  . nitroGLYCERIN (NITROSTAT) 0.4 MG SL tablet Place 1 tablet (0.4 mg total) under the tongue every 5 (five) minutes as needed for chest  pain. 25 tablet 1  . Omega-3 Fatty Acids (FISH OIL) 1000 MG CAPS Take 1,000 capsules by mouth 2 (two) times daily.     . rosuvastatin (CRESTOR) 40 MG tablet Take 1 tablet (40 mg total) by mouth daily. (Patient taking differently: Take 40 mg by mouth every evening. ) 90 tablet 3  . traMADol (ULTRAM) 50 MG tablet Take 1 tablet (50 mg total) by mouth every 6 (six) hours as needed. 20 tablet 0  . warfarin (COUMADIN) 5 MG tablet Take 1 tablet by mouth daily or as directed by coumadin clinic (Patient taking differently: Take 5 mg by mouth daily at 6 PM. 2.5 mg on Monday and Friday, 5 mg all other days) 90 tablet 1   No current facility-administered medications for this visit.    REVIEW OF SYSTEMS:  '[X]'$  denotes positive finding, '[ ]'$  denotes negative finding Cardiac  Comments:  Chest pain or chest pressure:    Shortness of breath upon exertion:    Short of breath when lying flat:    Irregular heart rhythm:    Constitutional    Fever or chills:      PHYSICAL EXAM: Filed Vitals:   03/16/16 1554 03/16/16 1555  BP: 122/71 113/68  Pulse: 69   Temp: 98.6 F (37 C)   TempSrc: Oral   Height: '6\' 3"'$  (1.905 m)   Weight: 264 lb (119.75 kg)   SpO2: 99%  GENERAL: The patient is a well-nourished male, in no acute distress. The vital signs are documented above. CARDIOVASCULAR: There is a regular rate and rhythm. PULMONARY: There is good air exchange bilaterally without wheezing or rales. Incision is healing nicely.  He is neurologically intact  MEDICAL ISSUES: Patient will follow up in 6 months with a repeat carotid duplex.  Annamarie Major [facility] [employer]: [phone]

## 2016-03-16 NOTE — Telephone Encounter (Signed)
Patient came in for Coumadin check and stated that his BP has been running low in the mornings and he feels dizzy. He reports that BP has been 100s/80s in the mornings. Today his BP was 122/80 in office. Decreased lisinopril to '10mg'$  and sent new RX as patient stated he was almost out of medication.

## 2016-03-23 ENCOUNTER — Encounter: Payer: Medicare Other | Admitting: Pharmacist Clinician (PhC)/ Clinical Pharmacy Specialist

## 2016-03-26 LAB — LAB REPORT - SCANNED
Cholesterol, Total: 153
Creatine, Serum: 1.5
HDL: 41
LDL (calc): 89
Potassium, serum: 4.6
Triglycerides: 75

## 2016-03-27 ENCOUNTER — Telehealth: Payer: Self-pay | Admitting: Pharmacist Clinician (PhC)/ Clinical Pharmacy Specialist

## 2016-03-27 ENCOUNTER — Telehealth: Payer: Self-pay | Admitting: Cardiology

## 2016-03-27 NOTE — Telephone Encounter (Signed)
Did not need encounter °

## 2016-03-27 NOTE — Telephone Encounter (Signed)
Patient called, reports BP still low, this am was only 898 systolic, 421 yesterday.  Lisinopril was decreased from 20 to 10 mg daily last week, but patient still feeling lightheaded.  Will have him hold lisinopril for now, call next week with pressures.

## 2016-04-06 ENCOUNTER — Telehealth: Payer: Self-pay | Admitting: Cardiovascular Disease

## 2016-04-06 NOTE — Telephone Encounter (Signed)
New message      STAT* If patient is at the pharmacy, call can be transferred to refill team.   1. Which medications need to be refilled? (please list name of each medication and dose if known) New Medication Zetia 10 mg po once a day  2. Which pharmacy/location (including street and city if local pharmacy) is medication to be sent to? [facility] [address]   3. Do they need a 30 day or 90 day supply? 3 day    Per Pt the pt had a wellness check done last week at PCP it was suggested to start Zetia 10 mg po one daily to help the pt cholesterol.

## 2016-04-06 NOTE — Telephone Encounter (Signed)
Pt was informed by his PCP, Dr. Brigitte Pulse, that is LDL was elevated and he should start Zetia 10 mg by mouth once daily. Pt stated that Dr. Ellyn Hack prescribes all of his medication and he would like his opinion and for him to send in the Rx if needed. Pt informed we have not received a copy of his recent lab work, he stated it was sent over recently, so it must not be scanned into our system. Pt informed what the information is viewable by the MD he can make a decision and we will get back with him with his decision. Pt would like [place] to send Rx, provide MD agrees, to [facility] [address]

## 2016-04-07 NOTE — Telephone Encounter (Signed)
I don't see scanned labs as of yet. If & when they are scanned,I will see if I agree with Dr. Brigitte Pulse -if so, will write Rx.  Glenetta Hew, MD

## 2016-04-14 ENCOUNTER — Telehealth: Payer: Self-pay | Admitting: Cardiology

## 2016-04-14 NOTE — Telephone Encounter (Signed)
Received lab work from Dr Raul Del office. Placed in bin to be scanned in.

## 2016-04-14 NOTE — Telephone Encounter (Signed)
Called Dr Raul Del office and Linus Orn is to fax over lab work this morning.  Called pt and let him know that we are waiting for the lab results, then they will be scanned into system for Dr Ellyn Hack to review. Advised pt it may take a few days for this process to happen. Pt will call back to check on status.

## 2016-04-14 NOTE — Telephone Encounter (Signed)
New message       Pt is calling to see if Dr Ellyn Hack received his most recent lab results and recommendations to start zetia from his PCP, Dr Brigitte Pulse.  Please call.

## 2016-04-21 ENCOUNTER — Other Ambulatory Visit: Payer: Self-pay | Admitting: *Deleted

## 2016-04-21 DIAGNOSIS — I6523 Occlusion and stenosis of bilateral carotid arteries: Secondary | ICD-10-CM

## 2016-04-28 ENCOUNTER — Ambulatory Visit: Payer: Medicare Other

## 2016-04-30 ENCOUNTER — Ambulatory Visit (INDEPENDENT_AMBULATORY_CARE_PROVIDER_SITE_OTHER): Payer: Medicare Other | Admitting: Pharmacist Clinician (PhC)/ Clinical Pharmacy Specialist

## 2016-04-30 DIAGNOSIS — Z7901 Long term (current) use of anticoagulants: Secondary | ICD-10-CM

## 2016-04-30 DIAGNOSIS — I4891 Unspecified atrial fibrillation: Secondary | ICD-10-CM

## 2016-04-30 LAB — POCT INR: INR: 2.9

## 2016-04-30 MED ORDER — EZETIMIBE 10 MG PO TABS: 10.0000 mg | ORAL_TABLET | Freq: Every day | ORAL | Status: DC

## 2016-04-30 NOTE — Telephone Encounter (Signed)
FORWARD TO DR HARDING LOOK UNDER LAB TAB- RESULTS NOT SCANNED INTO "RESULT BASKET"

## 2016-05-03 NOTE — Telephone Encounter (Signed)
Labs look OK. LFTs, chemistries & renal fxn normal. LDL still not @ goal, but OK. For now, continue current Rx.  Glenetta Hew, MD

## 2016-05-04 NOTE — Telephone Encounter (Signed)
Spoke to patient.  LAB Result given . Verbalized understanding FOLLOW INSTRUCTION OF WHAT KRISTIN  THE PHARMACIST SUGGESTED

## 2016-05-05 ENCOUNTER — Telehealth: Payer: Self-pay | Admitting: Cardiology

## 2016-05-05 NOTE — Telephone Encounter (Signed)
Returned call to pharmacy staff. Patient is taking both plavix and warfarin and MD is aware. Has been for some time.

## 2016-05-05 NOTE — Telephone Encounter (Signed)
New message    Pt c/o medication issue:  1. Name of Medication: Plavix and Coumadin  2. How are you currently taking this medication (dosage and times per day)? 75 mg po and 5 mg po daily  3. Are you having a reaction (difficulty breathing--STAT)? no  4. What is your medication issue? The pharmacy wants to make sure they can fill both medications or does one the medications need to be discontinued

## 2016-05-06 ENCOUNTER — Telehealth: Payer: Self-pay | Admitting: *Deleted

## 2016-05-06 NOTE — Telephone Encounter (Signed)
Request for surgical clearance:  1. What type of surgery is being performed? PROSTATE  BIOSPY ULTRASOUND   2. When is this surgery scheduled?  06/17/2016  3. Are there any medications that need to be held prior to surgery and how long? PLAVIX AND WARFARIN 5 DAYS PRIOR PROCEDURE  4. Name of physician performing surgery? DR ESKRIDGE  5. What is your office phone and fax number? FAX [phone], PHONE [phone] 6.

## 2016-05-07 NOTE — Telephone Encounter (Signed)
Ok to hold Plavix x 5 days pre-op.  Glenetta Hew, MD

## 2016-05-08 NOTE — Telephone Encounter (Signed)
Routed INFORMATION to Dr [facility] OFFICE

## 2016-05-08 NOTE — Telephone Encounter (Signed)
Defer to Dr Ellyn Hack  Can patient also hold warfarin for prostate biopsy ultraound?

## 2016-05-08 NOTE — Telephone Encounter (Signed)
Yes - OK to hold warfarin. Glenetta Hew, MD

## 2016-05-08 NOTE — Telephone Encounter (Signed)
OK to hold warfarin 5 days prior to procedure per protocol for AF with CHADS2 <4. Resume warfarin evening of procedure or as directed by dr. Edmonia Lynch INR 7-10 days after procedure.

## 2016-05-26 ENCOUNTER — Other Ambulatory Visit: Payer: Self-pay | Admitting: Cardiology

## 2016-05-29 ENCOUNTER — Other Ambulatory Visit: Payer: Self-pay | Admitting: Cardiology

## 2016-05-29 NOTE — Telephone Encounter (Signed)
Rx(s) sent to pharmacy electronically.  

## 2016-06-25 ENCOUNTER — Encounter: Payer: Self-pay | Admitting: Cardiology

## 2016-06-25 ENCOUNTER — Ambulatory Visit (INDEPENDENT_AMBULATORY_CARE_PROVIDER_SITE_OTHER): Payer: Medicare Other | Admitting: Pharmacist

## 2016-06-25 ENCOUNTER — Ambulatory Visit (INDEPENDENT_AMBULATORY_CARE_PROVIDER_SITE_OTHER): Payer: Medicare Other | Admitting: Cardiology

## 2016-06-25 VITALS — BP 151/88 | HR 53 | Ht 75.0 in | Wt 270.2 lb

## 2016-06-25 DIAGNOSIS — I2510 Atherosclerotic heart disease of native coronary artery without angina pectoris: Secondary | ICD-10-CM | POA: Diagnosis not present

## 2016-06-25 DIAGNOSIS — E669 Obesity, unspecified: Secondary | ICD-10-CM

## 2016-06-25 DIAGNOSIS — Z7901 Long term (current) use of anticoagulants: Secondary | ICD-10-CM

## 2016-06-25 DIAGNOSIS — I6522 Occlusion and stenosis of left carotid artery: Secondary | ICD-10-CM

## 2016-06-25 DIAGNOSIS — I480 Paroxysmal atrial fibrillation: Secondary | ICD-10-CM | POA: Diagnosis not present

## 2016-06-25 DIAGNOSIS — I4891 Unspecified atrial fibrillation: Secondary | ICD-10-CM

## 2016-06-25 DIAGNOSIS — I6523 Occlusion and stenosis of bilateral carotid arteries: Secondary | ICD-10-CM | POA: Diagnosis not present

## 2016-06-25 DIAGNOSIS — Z9861 Coronary angioplasty status: Secondary | ICD-10-CM

## 2016-06-25 DIAGNOSIS — I10 Essential (primary) hypertension: Secondary | ICD-10-CM | POA: Diagnosis not present

## 2016-06-25 DIAGNOSIS — E785 Hyperlipidemia, unspecified: Secondary | ICD-10-CM

## 2016-06-25 DIAGNOSIS — Z79899 Other long term (current) drug therapy: Secondary | ICD-10-CM

## 2016-06-25 LAB — POCT INR: INR: 1.5

## 2016-06-25 MED ORDER — LISINOPRIL 10 MG PO TABS: 10.0000 mg | ORAL_TABLET | Freq: Every day | ORAL | 3 refills | Status: DC

## 2016-06-25 NOTE — Progress Notes (Signed)
PCP: [name], MD  Clinic Note: Chief Complaint  Patient presents with  . Annual Exam    Pt staes no Sx.   . Coronary Artery Disease  . Atrial Fibrillation    PAF, paroxysmal A.  flutter    HPI: Charles Robinson is a 70 y.o. male with a PMH below who presents today for follow-up of CAD with extensive PCI to his circumflex system - . He also has paroxysmal atrial fibrillation on amiodarone and warfarin followed by Tommy Medal in our Coumadin clinic  Charles Robinson was last seen by me in June of last year, but he saw Kerin Ransom, [place] in December 2016 - who identified worsening left carotid bruit. Was followed up with carotid Dopplers demonstrating significant stenosis. He was then forwarded to first Dr. Gwenlyn Found, then [name] form vascular surgery.  Recent Hospitalizations:  He had left carotid endarterectomy done in April 2017  Studies Reviewed: No new studies beyond carotid Dopplers  Interval History: "Charles Robinson" presents today really doing well without any major complications. He tolerated his carotid endarterectomy very well. He has not really been as active as he would like to be in his having a hard time to keep his blood pressure off. He stated that his blood pressure medications were held secondary to hypotension after his carotid endarterectomy. He denies any sensation of rapid irregular heartbeats or palpitations to suggest recurrence of A. fib. No resting or exertional dyspnea. No coughing or wheezing. No resting or exertional anginal chest pain. No PND, orthopnea or edema.  No lightheadedness, dizziness, weakness or syncope/near syncope. No TIA/amaurosis fugax symptoms. No melena, hematochezia, hematuria, or epstaxis. No claudication.  ROS: A comprehensive was performed. Review of Systems  Constitutional: Negative.   HENT: Negative.   Eyes: Negative.   Respiratory: Negative.   Cardiovascular: Negative.        Per history of present illness  Gastrointestinal:  Negative for abdominal pain and heartburn.  Musculoskeletal: Positive for joint pain (Arthritis pains, stable).  Skin: Negative.   Neurological: Negative for dizziness and loss of consciousness.  Endo/Heme/Allergies: Bruises/bleeds easily.  Psychiatric/Behavioral: Negative.   All other systems reviewed and are negative.  Past Medical History:  Diagnosis Date  . Arthritis    back  . BPH (benign prostatic hypertrophy)   . Bruit 08/01/2009   doppler - no suggestion of significant diameter reduction, dissection or aneurysmal dilation.  normal evaluation  . CAD S/P multivessel PCI: LAD, RCA and extensive LCX-OM1 03/15/2012   s/p PCI to LAD, to RCA (now occluded), then extensive PCI to Dominant LCx-OM1  (enitre prox-AVG Circ for dissection in 07/2011)  . Cancer ([place])    SKIN CA  . GERD (gastroesophageal reflux disease)   . History of colon polyps   . History of echocardiogram 08/16/2011   Echo - EF 60-65%; moderate LV concentric hypertrophy; abdnormal LV relaxation (grade 1 diastolic dysfunction; ascending aorta mildly dilated; LA moderately dilated;   . Hyperlipidemia   . Hypertension   . PAF (paroxysmal atrial fibrillation) ([place]) 12/20/2012   followed by Dr. Glenetta Hew; s/p DCCV 2011, 07/2011 post PCI with Type 4a MI; CHA2DS2Vasc = 3, on Warfarin  . Paroxysmal atrial flutter ([place]) 08/20/2011   TEE- atrial septum - no defect identified; RA normal in size, no evidence of thrombus; ascending aorta normal  . Rupture quadriceps tendon     Past Surgical History:  Procedure Laterality Date  . BACK SURGERY    . COLONOSCOPY    . CORONARY  ANGIOPLASTY WITH STENT PLACEMENT  03/15/2012   multiple stens in AVGroove Circ & OM1;; 2 site PTCA w/ PTCA and stenting of OM2 ang PTCA of distal stent ISR followed by PTCA of mid LCx ISR  . CORONARY STENT PLACEMENT  2003 - 2011 - 2012 - 2013   Pre-2012 - BMS in RCA now known occluded, BMS in proximal LAD; 07/2011 - 2.25 x 23 BMS in OM 1 with significant  disease on either side noted shortly after -->  2 additional overlapping DES 2.5 mm x 30 mm and 2.25 mm x 26 mm in OM1,; 3 overlapping Resolute DES in AV groove Cx crossing OM1: (Tapered from 3.8-2.6 mm)- Resolute DES 3.5 x 22, 3.0 x 38, 2.5 x 14  . DOPPLER ECHOCARDIOGRAPHY  07/2011   EF ~60-65%, Grade 1 D Dysfxn; mod Conc LVH  . ENDARTERECTOMY Left 02/27/2016   Procedure: LEFT  CAROTID ARTERY ENDARTERECTOMY ;  Surgeon: Serafina Mitchell, MD;  Location: [facility];  Service: Vascular;  Laterality: Left;  . LEFT HEART CATHETERIZATION WITH CORONARY ANGIOGRAM N/A 03/15/2012   Procedure: LEFT HEART CATHETERIZATION WITH CORONARY ANGIOGRAM;  Surgeon: [name], MD;  Location: [facility] CATH LAB: patent LAD stents, patent LCx stents w/80% focal ISR just distal to OM; OM proximal stent open; -- 2 site PTCA-PCI  . LUMBAR LAMINECTOMY/DECOMPRESSION MICRODISCECTOMY Bilateral 12/11/2014   Procedure: Bilateral Lumbar Three-Four Laminectomy;  Surgeon: Kristeen Miss, MD;  Location: [facility] NEURO ORS;  Service: Neurosurgery;  Laterality: Bilateral;  bilateral  . NM MYOVIEW LTD  11/2012   No ischemia or infarction, normal EF & WM  . PATCH ANGIOPLASTY Left 02/27/2016   Procedure: WITH HEMASHIELD DACRON  PATCH ANGIOPLASTY;  Surgeon: Serafina Mitchell, MD;  Location: [facility];  Service: Vascular;  Laterality: Left;  [name] QUADRICEPS TENDON REPAIR  09/19/2012   Procedure: REPAIR QUADRICEP TENDON;  Surgeon: [name], MD;  Location: [facility];  Service: Orthopedics;  Laterality: Left;  [name] VASECTOMY      Prior to Admission medications   Medication Sig Start Date End Date Taking? Authorizing Provider  amiodarone (PACERONE) 200 MG tablet TAKE 1 TABLET EVERY DAY 05/27/16  Yes [name], MD  clopidogrel (PLAVIX) 75 MG tablet TAKE 1 TABLET (75 MG TOTAL) BY MOUTH DAILY. Patient taking differently: TAKE 1 TABLET (75 MG TOTAL) BY MOUTH every morning 07/02/15  Yes [name], MD  Coenzyme Q10 200 MG capsule Take 200 mg by mouth every morning.     Yes Historical Provider, MD  CRESTOR 40 MG tablet (Rosuvastatin Generic) TAKE 1 TABLET BY MOUTH DAILY 05/27/16  Yes [name], MD  doxazosin (CARDURA) 8 MG tablet Take 4 mg by mouth every evening.    Yes Dorena Cookey, MD  ezetimibe (ZETIA) 10 MG tablet Take 1 tablet (10 mg total) by mouth daily. 04/30/16  Yes [name], MD - New since his last visit   finasteride (PROSCAR) 5 MG tablet Take 5 mg by mouth every evening.    Yes Historical Provider, MD  metoprolol succinate (TOPROL-XL) 25 MG 24 hr tablet TAKE 1 TABLET BY MOUTH TWICE A DAY 05/29/16  Yes Lorretta Harp, MD  Multiple Vitamin (MULTIVITAMIN WITH MINERALS) TABS tablet Take 1 tablet by mouth every morning.    Yes Historical Provider, MD  nitroGLYCERIN (NITROSTAT) 0.4 MG SL tablet Place 1 tablet (0.4 mg total) under the tongue every 5 (five) minutes as needed for chest pain. 12/09/15  Yes [name], MD  Omega-3 Fatty Acids (FISH OIL) 1000 MG CAPS Take 1,000 capsules by mouth 2 (two) times daily.    Yes Historical Provider, MD  warfarin (COUMADIN) 5 MG tablet Take 1 tablet by mouth daily or as directed by coumadin clinic Patient taking differently: Take 5 mg by mouth daily at 6 PM. 2.5 mg on Monday and Friday, 5 mg all other days 07/16/15  Yes [name], MD  lisinopril (PRINIVIL,ZESTRIL) 10 MG tablet Take 1 tablet (10 mg total) by mouth daily.    Was Stopped post-CEA     Allergies  Allergen Reactions  . Lipitor [Atorvastatin] Other (See Comments)    Increased HR is currently tolerating --   . Morphine And Related Nausea And Vomiting  . Vicodin [Hydrocodone-Acetaminophen] Other (See Comments)    Feel crazy    Social History   Social History  . Marital status: Married    Spouse name: N/A  . Number of children: N/A  . Years of education: N/A   Social History Main Topics  . Smoking status: Never Smoker  . Smokeless tobacco: Never Used  . Alcohol use 1.2 oz/week    2 Cans of beer per week     Comment: rare    . Drug use: No  . Sexual activity: Not Currently   Other Topics Concern  . None   Social History Narrative   Charles Robinson is a father of 2, grandfather of 50.  His daughter was classmates with Dr. Ellyn Hack in college.   He and his wife are currently in the process of moving houses, further down the street in order to downsize to a single-story house.  He has been working out at [employer].          Family History  Problem Relation Age of Onset  . Heart disease Father     Wt Readings from Last 3 Encounters:  06/25/16 270 lb 3.2 oz (122.6 kg)  03/16/16 264 lb (119.7 kg)  02/27/16 274 lb 0.5 oz (124.3 kg)  -- weight 262 pounds June 2016  PHYSICAL EXAM BP (!) 151/88   Pulse (!) 53   Ht '6\' 3"'  (1.905 m)   Wt 270 lb 3.2 oz (122.6 kg)   BMI 33.77 kg/m  General appearance: alert, cooperative, appears stated age, no distress, mildly obese and Very pleasant mood and affect. His weight is down 15 pounds from his last visit weight of 261 pounds with a BMI of 30.75 today  HEENT: [place]/AP, EOMI, MMM, anicteric sclera Neck: no adenopathy, soft left carotid bruit, no JVD, supple, symmetrical, trachea midline and thyroid not enlarged, symmetric, no tenderness/mass/nodules  Lungs: CTAB normal  percussion bilaterally and Nonlabored. Normal expansion  Heart: RRR, S1, S2 normal, no murmur, click, rub or gallop and normal apical impulse  Abdomen: soft, non-tender; bowel sounds normal; no masses, no organomegaly and Mildly obese  Extremities: extremities normal, atraumatic, no cyanosis or edema; Pulses: 2+ and symmetric  Neurologic: A&oX 3, normal strength and tone. Normal symmetric reflexes. Normal coordination and gait    Adult ECG Report  Rate: 53 ;  Rhythm: sinus bradycardia and With 1 AVB (PR 216). Rightward axis of 94. Otherwise stable EKG.;   Narrative Interpretation: No notable change from last EKG.   Other studies Reviewed: Additional studies/ records that were reviewed today include:   Recent Labs:    Lab Results  Component Value Date   CHOL 145 08/02/2015   HDL 62 08/02/2015   LDLCALC 89 03/26/2016   TRIG 75  03/26/2016   CHOLHDL 2.3 08/02/2015    ASSESSMENT / PLAN: Problem List Items Addressed This Visit    PAF (paroxysmal atrial fibrillation) (HCC) - on Amiodarone to maintain NSR. This patients CHA2DS2-VASc Score is 3 - Primary (Chronic)    He has a history of PAF as well as atrial flutter. He did not do very well from a symptom standpoint in either rhythm. Therefore will maintaining sinus rhythm with amiodarone. Additional rate control with beta blocker.  This patients CHA2DS2-VASc Score and unadjusted Ischemic Stroke Rate (% per year) is equal to 3.2 % stroke rate/year from a score of 3:  Above score calculated as 1 point each if present [CHF, HTN, DM, Vascular=MI/PAD/Aortic Plaque, Age if 65-74, or Male] ---> currently on warfarin. Awaiting trials with DOAC's to determine safety of using DOAC plus Plavix long-term. No bleeding  complications, but he does have bruising. I told him that if he is to hold one medication for significant bruising or bleeding he should hold Plavix instead of his [place].      Relevant Medications   lisinopril (PRINIVIL,ZESTRIL) 10 MG tablet   On amiodarone therapy (Chronic)    Since were checking his lipid panels in September, will go ahead and check LFTs as well. TSH has been normal and followed by PCP. To evaluate for pulmonary toxicity which would be inflammatory, we will recheck PFTs along with ESR and CRP. I would hope to not have to stop amiodarone, as this is kept him in sinus rhythm.      Obesity (BMI 30-39.9) (Chronic)    He's gotten a little bit lax on his weight loss. I admonished him to get back into it exercise routine and try to lose weight. He remains active, but needs to focus on his diet and actually getting true exercise.      Long term current use of anticoagulant therapy (Chronic)   Left carotid artery stenosis  (Chronic)    Status post left carotid endarterectomy. Following up with vascular surgery. Due to have repeat Doppler soon.      Relevant Medications   lisinopril (PRINIVIL,ZESTRIL) 10 MG tablet   Essential hypertension (Chronic)    Blood pressure not quite adequately controlled. Restart his ACE inhibitor titrating back up to 10 mg a day.      Relevant Medications   lisinopril (PRINIVIL,ZESTRIL) 10 MG tablet   Dyslipidemia, goal LDL below 70 (Chronic)    Last labs showed relatively good control, but LDL still not to goal as of May. We started him on Zetia in addition to Crestor. He should be due for repeat check in roughly September timeframe. We'll get liver function tests as well as chemistries and lipid panel.      Relevant Medications   lisinopril (PRINIVIL,ZESTRIL) 10 MG tablet   CAD, s/p multiple PCIs (Chronic)    He is not having any further anginal symptoms. Most recent Myoview in January 2014 was negative showing resolution of the extensive amount of ischemia in the circumflex distribution. He has multiple stents in his coronary arteries including the occluded RCA stent he has 1 stent in his LAD, 3 DES and 1 BMS in his OM1 and 3 DES in the AV groove circumflex -- as a result, I think he needs to be on lifelong Plavix. No aspirin, since he is already on warfarin.  He is on stable dose of beta blocker which because of amiodarone we can titrate up further. He is back on his statin now on Zetia. Restarting ACE  inhibitor.  So far no further requirement for nitroglycerin, however we will go ahead and refill      Relevant Medications   lisinopril (PRINIVIL,ZESTRIL) 10 MG tablet    Other Visit Diagnoses    Hyperlipidemia       Relevant Medications   lisinopril (PRINIVIL,ZESTRIL) 10 MG tablet   Other Relevant Orders   Lipid panel      Current medicines are reviewed at length with the patient today. (+/- concerns) None The following changes have been made:   Medication  Instructions:   START LISINOPRIL 10 MG ONCE DAILY= TAKE 1/2 TABLET X 1 WEEK THEN INCREASE TO WHOLE TAQBLET ONCE DAILY  Labwork:  Your physician recommends that you return for lab work in: September= DO NOT EAT PRIOR TO LAB WORK   Studies Ordered:   Orders Placed This Encounter  Procedures  . Lipid panel  . Comprehensive Metabolic Panel (CMET)  . Sed Rate (ESR)  . C-reactive protein  . EKG 12-Lead  . Pulmonary function test   Follow-Up: 6 months with Kerin Ransom, PA, one year with Dr. Sanjuana Kava, M.D., M.S. Interventional Cardiologist   Pager # [phone] Phone # [phone] [address] [place], [place] [address]

## 2016-06-25 NOTE — Patient Instructions (Signed)
Medication Instructions:   START LISINOPRIL 10 MG ONCE DAILY= TAKE 1/2 TABLET X 1 WEEK THEN INCREASE TO WHOLE TAQBLET ONCE DAILY  Labwork:  Your physician recommends that you return for lab work in: September= DO NOT EAT PRIOR TO LAB WORK  Testing/Procedures:  Your physician has recommended that you have a pulmonary function test. Pulmonary Function Tests are a group of tests that measure how well air moves in and out of your lungs.SCHEDULE IN SEPTEMBER    Follow-Up:  Your physician wants you to follow-up in: [place] will receive a reminder letter in the mail two months in advance. If you don't receive a letter, please call our office to schedule the follow-up appointment.   Your physician wants you to follow-up in: [place] will receive a reminder letter in the mail two months in advance. If you don't receive a letter, please call our office to schedule the follow-up appointment.   If you need a refill on your cardiac medications before your next appointment, please call your pharmacy.

## 2016-06-27 ENCOUNTER — Encounter: Payer: Self-pay | Admitting: Cardiology

## 2016-06-27 NOTE — Assessment & Plan Note (Signed)
He's gotten a little bit lax on his weight loss. I admonished him to get back into it exercise routine and try to lose weight. He remains active, but needs to focus on his diet and actually getting true exercise.

## 2016-06-27 NOTE — Assessment & Plan Note (Signed)
Last labs showed relatively good control, but LDL still not to goal as of May. We started him on Zetia in addition to Crestor. He should be due for repeat check in roughly September timeframe. We'll get liver function tests as well as chemistries and lipid panel.

## 2016-06-27 NOTE — Assessment & Plan Note (Signed)
Blood pressure not quite adequately controlled. Restart his ACE inhibitor titrating back up to 10 mg a day.

## 2016-06-27 NOTE — Assessment & Plan Note (Addendum)
He has a history of PAF as well as atrial flutter. He did not do very well from a symptom standpoint in either rhythm. Therefore will maintaining sinus rhythm with amiodarone. Additional rate control with beta blocker.  This patients CHA2DS2-VASc Score and unadjusted Ischemic Stroke Rate (% per year) is equal to 3.2 % stroke rate/year from a score of 3:  Above score calculated as 1 point each if present [CHF, HTN, DM, Vascular=MI/PAD/Aortic Plaque, Age if 65-74, or Male] ---> currently on warfarin. Awaiting trials with DOAC's to determine safety of using DOAC plus Plavix long-term. No bleeding  complications, but he does have bruising. I told him that if he is to hold one medication for significant bruising or bleeding he should hold Plavix instead of his [place].

## 2016-06-27 NOTE — Assessment & Plan Note (Signed)
Status post left carotid endarterectomy. Following up with vascular surgery. Due to have repeat Doppler soon.

## 2016-06-27 NOTE — Assessment & Plan Note (Signed)
He is not having any further anginal symptoms. Most recent Myoview in January 2014 was negative showing resolution of the extensive amount of ischemia in the circumflex distribution. He has multiple stents in his coronary arteries including the occluded RCA stent he has 1 stent in his LAD, 3 DES and 1 BMS in his OM1 and 3 DES in the AV groove circumflex -- as a result, I think he needs to be on lifelong Plavix. No aspirin, since he is already on warfarin.  He is on stable dose of beta blocker which because of amiodarone we can titrate up further. He is back on his statin now on Zetia. Restarting ACE inhibitor.  So far no further requirement for nitroglycerin, however we will go ahead and refill

## 2016-06-27 NOTE — Assessment & Plan Note (Signed)
Since were checking his lipid panels in September, will go ahead and check LFTs as well. TSH has been normal and followed by PCP. To evaluate for pulmonary toxicity which would be inflammatory, we will recheck PFTs along with ESR and CRP. I would hope to not have to stop amiodarone, as this is kept him in sinus rhythm.

## 2016-07-10 ENCOUNTER — Other Ambulatory Visit: Payer: Self-pay | Admitting: Cardiology

## 2016-07-29 ENCOUNTER — Ambulatory Visit (INDEPENDENT_AMBULATORY_CARE_PROVIDER_SITE_OTHER): Payer: Medicare Other | Admitting: Pharmacist

## 2016-07-29 DIAGNOSIS — I480 Paroxysmal atrial fibrillation: Secondary | ICD-10-CM | POA: Diagnosis not present

## 2016-07-29 DIAGNOSIS — Z7901 Long term (current) use of anticoagulants: Secondary | ICD-10-CM

## 2016-07-29 DIAGNOSIS — I4891 Unspecified atrial fibrillation: Secondary | ICD-10-CM

## 2016-07-29 LAB — COMPREHENSIVE METABOLIC PANEL
ALT: 32 U/L (ref 9–46)
AST: 36 U/L — ABNORMAL HIGH (ref 10–35)
Albumin: 3.6 g/dL (ref 3.6–5.1)
Alkaline Phosphatase: 55 U/L (ref 40–115)
BUN: 19 mg/dL (ref 7–25)
CO2: 28 mmol/L (ref 20–31)
Calcium: 8.5 mg/dL — ABNORMAL LOW (ref 8.6–10.3)
Chloride: 105 mmol/L (ref 98–110)
Creat: 1.43 mg/dL — ABNORMAL HIGH (ref 0.70–1.18)
Glucose, Bld: 85 mg/dL (ref 65–99)
Potassium: 4.4 mmol/L (ref 3.5–5.3)
Sodium: 141 mmol/L (ref 135–146)
Total Bilirubin: 1.3 mg/dL — ABNORMAL HIGH (ref 0.2–1.2)
Total Protein: 6.1 g/dL (ref 6.1–8.1)

## 2016-07-29 LAB — SEDIMENTATION RATE: Sed Rate: 4 mm/hr (ref 0–20)

## 2016-07-29 LAB — LIPID PANEL
Cholesterol: 120 mg/dL — ABNORMAL LOW (ref 125–200)
HDL: 45 mg/dL (ref >=40)
LDL Cholesterol: 63 mg/dL (ref <130)
Total CHOL/HDL Ratio: 2.7 Ratio (ref <=5.0)
Triglycerides: 62 mg/dL (ref <150)
VLDL: 12 mg/dL (ref <30)

## 2016-07-29 LAB — C-REACTIVE PROTEIN: CRP: <0.5 mg/dL (ref <0.60)

## 2016-07-29 LAB — POCT INR: INR: 3.2

## 2016-07-31 ENCOUNTER — Other Ambulatory Visit: Payer: Self-pay | Admitting: Cardiology

## 2016-08-03 ENCOUNTER — Encounter (INDEPENDENT_AMBULATORY_CARE_PROVIDER_SITE_OTHER): Payer: Medicare Other | Admitting: Internal Medicine

## 2016-08-03 DIAGNOSIS — I4891 Unspecified atrial fibrillation: Secondary | ICD-10-CM | POA: Diagnosis not present

## 2016-08-03 LAB — PULMONARY FUNCTION TEST
DL/VA % pred: 94 %
DL/VA: 4.56 ml/min/mmHg/L
DLCO cor % pred: 81 %
DLCO cor: 30.58 ml/min/mmHg
DLCO unc % pred: 77 %
DLCO unc: 29.3 ml/min/mmHg
FEF 25-75 Post: 3.06 L/sec
FEF 25-75 Pre: 2.82 L/sec
FEF2575-%Change-Post: 8 %
FEF2575-%Pred-Post: 112 %
FEF2575-%Pred-Pre: 103 %
FEV1-%Change-Post: 3 %
FEV1-%Pred-Post: 95 %
FEV1-%Pred-Pre: 92 %
FEV1-Post: 3.51 L
FEV1-Pre: 3.41 L
FEV1FVC-%Change-Post: 0 %
FEV1FVC-%Pred-Pre: 104 %
FEV6-%Change-Post: 4 %
FEV6-%Pred-Post: 96 %
FEV6-%Pred-Pre: 92 %
FEV6-Post: 4.6 L
FEV6-Pre: 4.39 L
FEV6FVC-%Change-Post: 0 %
FEV6FVC-%Pred-Post: 105 %
FEV6FVC-%Pred-Pre: 104 %
FVC-%Change-Post: 2 %
FVC-%Pred-Post: 91 %
FVC-%Pred-Pre: 89 %
FVC-Post: 4.61 L
FVC-Pre: 4.49 L
Post FEV1/FVC ratio: 76 %
Post FEV6/FVC ratio: 100 %
Pre FEV1/FVC ratio: 76 %
Pre FEV6/FVC Ratio: 99 %
RV % pred: 99 %
RV: 2.72 L
TLC % pred: 95 %
TLC: 7.53 L

## 2016-08-05 ENCOUNTER — Telehealth: Payer: Self-pay | Admitting: *Deleted

## 2016-08-05 NOTE — Telephone Encounter (Signed)
-----   Message from [name], MD sent at 07/31/2016 10:40 AM EDT ----- Lipid panel looks excellent.. Other labs also look pretty good. CRP and sedimentation rate were negative -- that suggests that there is no pulmonary toxicity from amiodarone.  Glenetta Hew, MD

## 2016-08-05 NOTE — Telephone Encounter (Signed)
Spoke to patient. Result given . Verbalized understanding  

## 2016-09-08 ENCOUNTER — Ambulatory Visit (INDEPENDENT_AMBULATORY_CARE_PROVIDER_SITE_OTHER): Payer: Medicare Other | Admitting: Pharmacist

## 2016-09-08 DIAGNOSIS — I480 Paroxysmal atrial fibrillation: Secondary | ICD-10-CM | POA: Diagnosis not present

## 2016-09-08 DIAGNOSIS — I4891 Unspecified atrial fibrillation: Secondary | ICD-10-CM | POA: Diagnosis not present

## 2016-09-08 DIAGNOSIS — Z7901 Long term (current) use of anticoagulants: Secondary | ICD-10-CM | POA: Diagnosis not present

## 2016-09-08 LAB — POCT INR: INR: 2.6

## 2016-09-23 ENCOUNTER — Encounter: Payer: Self-pay | Admitting: Surgery

## 2016-09-28 ENCOUNTER — Encounter: Payer: Self-pay | Admitting: Surgery

## 2016-09-28 ENCOUNTER — Ambulatory Visit (HOSPITAL_COMMUNITY)
Admission: RE | Admit: 2016-09-28 | Discharge: 2016-09-28 | Disposition: A | Discharge status: Home / Self Care | Payer: Medicare Other | Source: Ambulatory Visit | Attending: Surgery | Admitting: Surgery

## 2016-09-28 ENCOUNTER — Ambulatory Visit (INDEPENDENT_AMBULATORY_CARE_PROVIDER_SITE_OTHER): Payer: Medicare Other | Admitting: Surgery

## 2016-09-28 VITALS — BP 134/78 | HR 60 | Temp 98.7°F | Resp 20 | Ht 75.0 in | Wt 275.0 lb

## 2016-09-28 DIAGNOSIS — I6523 Occlusion and stenosis of bilateral carotid arteries: Secondary | ICD-10-CM | POA: Diagnosis not present

## 2016-09-28 DIAGNOSIS — I6522 Occlusion and stenosis of left carotid artery: Secondary | ICD-10-CM | POA: Diagnosis not present

## 2016-09-28 LAB — VAS US CAROTID
LEFT ECA DIAS: -14 cm/s
LEFT VERTEBRAL DIAS: -19 cm/s
Left CCA dist dias: -15 cm/s
Left CCA dist sys: -83 cm/s
Left CCA prox dias: 13 cm/s
Left CCA prox sys: 104 cm/s
Left ICA dist dias: -19 cm/s
Left ICA dist sys: -77 cm/s
Left ICA prox dias: 18 cm/s
Left ICA prox sys: 76 cm/s
RIGHT CCA MID DIAS: -22 cm/s
RIGHT ECA DIAS: -5 cm/s
RIGHT VERTEBRAL DIAS: 32 cm/s
Right CCA prox dias: 16 cm/s
Right CCA prox sys: 127 cm/s
Right cca dist sys: -99 cm/s

## 2016-09-28 NOTE — Progress Notes (Signed)
This is a 70 year old gentleman that is referred today for evaluation of left carotid stenosis.  This was detected with a bruit.  He is asymptomatic.  Specifically, he denies numbness or weakness in either extremity.  He denies slurred speech.  He denies amaurosis fugax.  He has a history of ischemic heart disease, status post LAD stent placement in 2004.  He had circumflex and obtuse marginal branch interventions in 2012.  He also suffers from paroxysmal atrial fibrillation, maintained in sinus rhythm on Coumadin and amiodarone.  He suffers from hypertension which is managed with an ACE inhibitor.  His hypercholesterolemia is treated with a statin.  He is a nonsmoker  He underwent left CEA on 02/27/2016 by [name].  He does have some mild swallowing difficulty and loss of vocal loudness at times.  No other neurologic deficits.   He is here for a follow up carotid duplex.  He has had no change in his medical history since his last visit.  He continues to take Plavix and Coumadin daily.     Past Medical History:  Diagnosis Date  . Arthritis    back  . BPH (benign prostatic hypertrophy)   . Bruit 08/01/2009   doppler - no suggestion of significant diameter reduction, dissection or aneurysmal dilation.  normal evaluation  . CAD S/P multivessel PCI: LAD, RCA and extensive LCX-OM1 03/15/2012   s/p PCI to LAD, to RCA (now occluded), then extensive PCI to Dominant LCx-OM1  (enitre prox-AVG Circ for dissection in 07/2011)  . Cancer ([place])    SKIN CA  . GERD (gastroesophageal reflux disease)   . History of colon polyps   . History of echocardiogram 08/16/2011   Echo - EF 60-65%; moderate LV concentric hypertrophy; abdnormal LV relaxation (grade 1 diastolic dysfunction; ascending aorta mildly dilated; LA moderately dilated;   . Hyperlipidemia   . Hypertension   . PAF (paroxysmal atrial fibrillation) ([place]) 12/20/2012   followed by Dr. Glenetta Hew; s/p DCCV 2011, 07/2011 post PCI with  Type 4a MI; CHA2DS2Vasc = 3, on Warfarin  . Paroxysmal atrial flutter ([place]) 08/20/2011   TEE- atrial septum - no defect identified; RA normal in size, no evidence of thrombus; ascending aorta normal  . Rupture quadriceps tendon     Past Surgical History:  Procedure Laterality Date  . BACK SURGERY    . COLONOSCOPY    . CORONARY ANGIOPLASTY WITH STENT PLACEMENT  03/15/2012   multiple stens in AVGroove Circ & OM1;; 2 site PTCA w/ PTCA and stenting of OM2 ang PTCA of distal stent ISR followed by PTCA of mid LCx ISR  . CORONARY STENT PLACEMENT  2003 - 2011 - 2012 - 2013   Pre-2012 - BMS in RCA now known occluded, BMS in proximal LAD; 07/2011 - 2.25 x 23 BMS in OM 1 with significant disease on either side noted shortly after -->  2 additional overlapping DES 2.5 mm x 30 mm and 2.25 mm x 26 mm in OM1,; 3 overlapping Resolute DES in AV groove Cx crossing OM1: (Tapered from 3.8-2.6 mm)- Resolute DES 3.5 x 22, 3.0 x 38, 2.5 x 14  . DOPPLER ECHOCARDIOGRAPHY  07/2011   EF ~60-65%, Grade 1 D Dysfxn; mod Conc LVH  . ENDARTERECTOMY Left 02/27/2016   Procedure: LEFT  CAROTID ARTERY ENDARTERECTOMY ;  Surgeon: Serafina Mitchell, MD;  Location: [facility];  Service: Vascular;  Laterality: Left;  . LEFT HEART CATHETERIZATION WITH CORONARY ANGIOGRAM N/A 03/15/2012   Procedure: LEFT HEART  CATHETERIZATION WITH CORONARY ANGIOGRAM;  Surgeon: [name], MD;  Location: [facility] CATH LAB: patent LAD stents, patent LCx stents w/80% focal ISR just distal to OM; OM proximal stent open; -- 2 site PTCA-PCI  . LUMBAR LAMINECTOMY/DECOMPRESSION MICRODISCECTOMY Bilateral 12/11/2014   Procedure: Bilateral Lumbar Three-Four Laminectomy;  Surgeon: Kristeen Miss, MD;  Location: [facility] NEURO ORS;  Service: Neurosurgery;  Laterality: Bilateral;  bilateral  . NM MYOVIEW LTD  11/2012   No ischemia or infarction, normal EF & WM  . PATCH ANGIOPLASTY Left 02/27/2016   Procedure: WITH HEMASHIELD DACRON  PATCH ANGIOPLASTY;  Surgeon: Serafina Mitchell, MD;   Location: [facility];  Service: Vascular;  Laterality: Left;  [name] QUADRICEPS TENDON REPAIR  09/19/2012   Procedure: REPAIR QUADRICEP TENDON;  Surgeon: [name], MD;  Location: [facility];  Service: Orthopedics;  Laterality: Left;  [name] VASECTOMY       Social History Social History  Substance Use Topics  . Smoking status: Never Smoker  . Smokeless tobacco: Never Used  . Alcohol use 1.2 oz/week    2 Cans of beer per week     Comment: rare    Family History Family History  Problem Relation Age of Onset  . Heart disease Father     Allergies  Allergies  Allergen Reactions  . Lipitor [Atorvastatin] Other (See Comments)    Increased HR is currently tolerating --   . Morphine And Related Nausea And Vomiting  . Vicodin [Hydrocodone-Acetaminophen] Other (See Comments)    Feel crazy     Current Outpatient Prescriptions  Medication Sig Dispense Refill  . amiodarone (PACERONE) 200 MG tablet TAKE 1 TABLET EVERY DAY 90 tablet 2  . clopidogrel (PLAVIX) 75 MG tablet TAKE 1 TABLET BY MOUTH EVERY DAY 90 tablet 3  . Coenzyme Q10 200 MG capsule Take 200 mg by mouth every morning.     [name] CRESTOR 40 MG tablet TAKE 1 TABLET BY MOUTH DAILY 90 tablet 3  . doxazosin (CARDURA) 8 MG tablet Take 4 mg by mouth every evening.     . ezetimibe (ZETIA) 10 MG tablet Take 1 tablet (10 mg total) by mouth daily. 90 tablet 3  . finasteride (PROSCAR) 5 MG tablet Take 5 mg by mouth every evening.     [name] lisinopril (PRINIVIL,ZESTRIL) 10 MG tablet Take 1 tablet (10 mg total) by mouth daily. 90 tablet 3  . metoprolol succinate (TOPROL-XL) 25 MG 24 hr tablet TAKE 1 TABLET BY MOUTH TWICE A DAY 180 tablet 2  . Multiple Vitamin (MULTIVITAMIN WITH MINERALS) TABS tablet Take 1 tablet by mouth every morning.     . nitroGLYCERIN (NITROSTAT) 0.4 MG SL tablet Place 1 tablet (0.4 mg total) under the tongue every 5 (five) minutes as needed for chest pain. 25 tablet 1  . Omega-3 Fatty Acids (FISH OIL) 1000 MG CAPS Take 1,000 capsules  by mouth 2 (two) times daily.     [name] warfarin (COUMADIN) 5 MG tablet TAKE 1 TABLET BY MOUTH DAILY OR AS DIRECTED BY COUMADIN CLINIC 90 tablet 1   No current facility-administered medications for this visit.     ROS:   General:  No weight loss, Fever, chills  HEENT: No recent headaches, no nasal bleeding, no visual changes, no sore throat. Mild swallowing difficulties  Neurologic: No dizziness, blackouts, seizures. No recent symptoms of stroke or mini- stroke. No recent episodes of slurred speech, or temporary blindness.  Cardiac: No recent episodes of chest pain/pressure, no shortness of breath at rest.  No shortness of breath with exertion.  Denies history of atrial fibrillation or irregular heartbeat  Vascular: No history of rest pain in feet.  No history of claudication.  No history of non-healing ulcer, No history of DVT   Pulmonary: No home oxygen, no productive cough, no hemoptysis,  No asthma or wheezing  Musculoskeletal:  '[ ]'$  Arthritis, '[ ]'$  Low back pain,  '[ ]'$  Joint pain  Hematologic:No history of hypercoagulable state.  No history of easy bleeding.  No history of anemia  Gastrointestinal: No hematochezia or melena,  No gastroesophageal reflux, no trouble swallowing  Urinary: '[ ]'$  chronic Kidney disease, '[ ]'$  on HD - '[ ]'$  MWF or '[ ]'$  TTHS, '[ ]'$  Burning with urination, '[ ]'$  Frequent urination, '[ ]'$  Difficulty urinating;   Skin: No rashes  Psychological: No history of anxiety,  No history of depression   Physical Examination BP 134/78 Resp 20 HR 60 bpm T 98.7  General:  Alert and oriented, no acute distress HEENT: Normal Neck: No bruit or JVD Pulmonary: Clear to auscultation bilaterally Cardiac: Regular Rate and Rhythm without murmur Gastrointestinal: Soft, non-tender, non-distended, no mass, no scars Skin: No rash, well healed left neck incision Extremity Pulses:  2+ radial, brachial Musculoskeletal: No deformity or edema  Neurologic: Upper and lower extremity motor 5/5  and symmetric  DATA:  Diagnostic Studies: Pre-op I have reviewed the ultrasound studies which show 80-99 percent left carotid stenosis and 0-39 percent right carotid stenosis.  The bifurcation is in the mid neck and the artery is normal past the stenosis.  Carotid duplex 1-39% bilaterally Left CEA patent   ASSESSMENT:  Asymptomatic carotid stenosis S/P left CEA   PLAN: Plan to return in 1 year for repeat carotid duplex.  Symptoms of TIA and Stroke were reviewed.  If he has symptoms he will call our office or 911.    Theda Sers, EMMA MAUREEN PA-C [facility]  He was seen in conjunction with [name]    I agree with the above.  I have seen and evaluated the patient.  He is status post left carotid endarterectomy on 02/27/2016.  This was done for asymptomatic stenosis.  The lesion and plaque extended up to the skull base.  There was also a significant inflammatory reaction around the internal carotid artery I could not place a shunt because of how high the lesion was.  He has no significant complaints today.  His duplex shows a widely patent endarterectomy site.  He will follow up in one year with surveillance ultrasound.  Annamarie Major

## 2016-09-30 ENCOUNTER — Encounter: Payer: Self-pay | Admitting: Gastroenterology

## 2016-10-14 NOTE — Addendum Note (Signed)
Addended by: Lianne Cure A on: 10/14/2016 09:39 AM   Modules accepted: Orders

## 2016-10-20 ENCOUNTER — Ambulatory Visit (INDEPENDENT_AMBULATORY_CARE_PROVIDER_SITE_OTHER): Payer: Medicare Other | Admitting: Pharmacist Clinician (PhC)/ Clinical Pharmacy Specialist

## 2016-10-20 DIAGNOSIS — I480 Paroxysmal atrial fibrillation: Secondary | ICD-10-CM | POA: Diagnosis not present

## 2016-10-20 DIAGNOSIS — Z7901 Long term (current) use of anticoagulants: Secondary | ICD-10-CM

## 2016-10-20 DIAGNOSIS — I4891 Unspecified atrial fibrillation: Secondary | ICD-10-CM

## 2016-10-20 DIAGNOSIS — I6523 Occlusion and stenosis of bilateral carotid arteries: Secondary | ICD-10-CM

## 2016-10-20 LAB — POCT INR: INR: 2.5

## 2016-12-02 ENCOUNTER — Ambulatory Visit (INDEPENDENT_AMBULATORY_CARE_PROVIDER_SITE_OTHER): Payer: Medicare Other | Admitting: Pharmacist Clinician (PhC)/ Clinical Pharmacy Specialist

## 2016-12-02 ENCOUNTER — Encounter: Payer: Self-pay | Admitting: Cardiology

## 2016-12-02 ENCOUNTER — Ambulatory Visit (INDEPENDENT_AMBULATORY_CARE_PROVIDER_SITE_OTHER): Payer: Medicare Other | Admitting: Cardiology

## 2016-12-02 VITALS — BP 128/82 | HR 54 | Ht 75.0 in | Wt 275.4 lb

## 2016-12-02 DIAGNOSIS — I480 Paroxysmal atrial fibrillation: Secondary | ICD-10-CM

## 2016-12-02 DIAGNOSIS — I4891 Unspecified atrial fibrillation: Secondary | ICD-10-CM

## 2016-12-02 DIAGNOSIS — I2510 Atherosclerotic heart disease of native coronary artery without angina pectoris: Secondary | ICD-10-CM

## 2016-12-02 DIAGNOSIS — N183 Chronic kidney disease, stage 3 unspecified: Secondary | ICD-10-CM

## 2016-12-02 DIAGNOSIS — Z7901 Long term (current) use of anticoagulants: Secondary | ICD-10-CM

## 2016-12-02 DIAGNOSIS — I10 Essential (primary) hypertension: Secondary | ICD-10-CM

## 2016-12-02 DIAGNOSIS — Z9861 Coronary angioplasty status: Secondary | ICD-10-CM

## 2016-12-02 DIAGNOSIS — I6522 Occlusion and stenosis of left carotid artery: Secondary | ICD-10-CM

## 2016-12-02 DIAGNOSIS — E785 Hyperlipidemia, unspecified: Secondary | ICD-10-CM

## 2016-12-02 LAB — POCT INR: INR: 2.4

## 2016-12-02 NOTE — Assessment & Plan Note (Signed)
Controlled.  

## 2016-12-02 NOTE — Assessment & Plan Note (Signed)
CRI-stage 3

## 2016-12-02 NOTE — Assessment & Plan Note (Signed)
Coumadin Rx 

## 2016-12-02 NOTE — Assessment & Plan Note (Signed)
S/P LCE- Dr Trula Slade.

## 2016-12-02 NOTE — Assessment & Plan Note (Signed)
on Amiodarone maintaining NSR. This patients CHA2 VASc=3

## 2016-12-02 NOTE — Progress Notes (Signed)
12/02/2016 Charles Robinson   01-Apr-1946  [id number]  Primary Physician [name], MD Primary Cardiologist: Dr Ellyn Hack  HPI:  71 y/o male previously followed by Dr Rex Kras, now followed by Dr Ellyn Hack. He has a history of CAD, he had an LAD stent in 2004, and a CFX/OM PCI June 2012.  In September 2012 he was restudied for angina and had OM/CFX disease treated with PCI that was complicated by dissection. Dr Ellyn Hack ultimately got a good result. He was seen again in April 2013 after an abnormal Myoview suggested ISR. He was studied and had a stent placed to the OM and a PCI as well. He essentially has a full metal jacket OM.CFX. He did have a follow up Myoview in Jan 2014 and this was low risk. He has done well since his last PCI. He denies chest pain. He has had PAF since Sept 2012 but has been holding NSR on Amiodarone. He is on Coumadin and Plavix. He has spinal stenosis and had surgery in Jan 2016 which he tolerated well and had a good result. He is in the office for a 6 month check up. He denies chest pain or palpations. He stays phisically active, he goes to senior exercise classes at the [facility] and goes fly fishing in [place].    Current Outpatient Prescriptions  Medication Sig Dispense Refill  . amiodarone (PACERONE) 200 MG tablet TAKE 1 TABLET EVERY DAY 90 tablet 2  . clopidogrel (PLAVIX) 75 MG tablet TAKE 1 TABLET BY MOUTH EVERY DAY 90 tablet 3  . Coenzyme Q10 200 MG capsule Take 200 mg by mouth every morning.     [name] CRESTOR 40 MG tablet TAKE 1 TABLET BY MOUTH DAILY 90 tablet 3  . doxazosin (CARDURA) 8 MG tablet Take 4 mg by mouth every evening.     . ezetimibe (ZETIA) 10 MG tablet Take 1 tablet (10 mg total) by mouth daily. 90 tablet 3  . finasteride (PROSCAR) 5 MG tablet Take 5 mg by mouth every evening.     . metoprolol succinate (TOPROL-XL) 25 MG 24 hr tablet TAKE 1 TABLET BY MOUTH TWICE A DAY 180 tablet 2  . Multiple Vitamin (MULTIVITAMIN WITH MINERALS) TABS tablet Take 1  tablet by mouth every morning.     . nitroGLYCERIN (NITROSTAT) 0.4 MG SL tablet Place 0.4 mg under the tongue every 5 (five) minutes as needed for chest pain. X 3 doses    . Omega-3 Fatty Acids (FISH OIL) 1000 MG CAPS Take 1,000 capsules by mouth 2 (two) times daily.     [name] warfarin (COUMADIN) 5 MG tablet TAKE 1 TABLET BY MOUTH DAILY OR AS DIRECTED BY COUMADIN CLINIC 90 tablet 1  . lisinopril (PRINIVIL,ZESTRIL) 10 MG tablet Take 1 tablet (10 mg total) by mouth daily. 90 tablet 3   No current facility-administered medications for this visit.     Allergies  Allergen Reactions  . Lipitor [Atorvastatin] Other (See Comments)    Increased HR is currently tolerating --   . Morphine And Related Nausea And Vomiting  . Vicodin [Hydrocodone-Acetaminophen] Other (See Comments)    Feel crazy    Social History   Social History  . Marital status: Married    Spouse name: N/A  . Number of children: N/A  . Years of education: N/A   Occupational History  . Not on file.   Social History Main Topics  . Smoking status: Never Smoker  . Smokeless tobacco: Never Used  . Alcohol  use 1.2 oz/week    2 Cans of beer per week     Comment: rare  . Drug use: No  . Sexual activity: Not Currently   Other Topics Concern  . Not on file   Social History Narrative   Charles Robinson is a father of 2, grandfather of 52.  His daughter was classmates with Dr. Ellyn Hack in college.   He and his wife are currently in the process of moving houses, further down the street in order to downsize to a single-story house.  He has been working out at [employer].           Review of Systems: General: negative for chills, fever, night sweats or weight changes.  Cardiovascular: negative for chest pain, dyspnea on exertion, edema, orthopnea, palpitations, paroxysmal nocturnal dyspnea or shortness of breath Dermatological: negative for rash Respiratory: negative for cough or wheezing Urologic: negative for hematuria Abdominal: negative  for nausea, vomiting, diarrhea, bright red blood per rectum, melena, or hematemesis Neurologic: negative for visual changes, syncope, or dizziness All other systems reviewed and are otherwise negative except as noted above.    Blood pressure 128/82, pulse (!) 54, height '6\' 3"'$  (1.905 m), weight 275 lb 6.4 oz (124.9 kg).  General appearance: alert, cooperative, no distress and mildly obese Neck: no JVD and LCE scar, soft RCA bruit Lungs: clear to auscultation bilaterally Heart: regular rate and rhythm Extremities: no edema Skin: Skin color, texture, turgor normal. No rashes or lesions Neurologic: Grossly normal  EKG NSR, SB-54  ASSESSMENT AND PLAN:   CAD, s/p multiple PCIs s/p PCI to LAD and RCA in 2004. The RCA is known to be occluded. He has had multiple interventions to the OM/ CFX- June 2012, Sept 2012, and April 2013. Basically a CFX full metal jacket. Myoview: January 2014: Complete resolution of ischemia previously noted, normal ejection fraction, no infarction.  PAF (paroxysmal atrial fibrillation) ([facility])  on Amiodarone maintaining NSR. This patients CHA2 VASc=3  Essential hypertension Controlled  Long term current use of anticoagulant therapy Coumadin Rx  Left carotid artery stenosis S/P LCE- Dr Trula Slade.  Chronic renal insufficiency, stage III (moderate) CRI-stage 3  Dyslipidemia, goal LDL below 70 LDL was 63 Sept 2017   PLAN  Charles Robinson is doing well. I did suggest he cut his Toprol back to 25 mg daily if he noted any fatigue or noted his HR was running 45-50. He asked if he should continue Plavix and Coumadin and I told him I would ask Dr Ellyn Hack about that, he has not had bleeding or bruising issues.   Kerin Ransom PA-C 12/02/2016 9:23 AM

## 2016-12-02 NOTE — Patient Instructions (Addendum)
   IF HEART RATE DROPS 45 TO 50, CONTINUALLY YOU SHOULD CUT BACK METOPROLOL TO 25 MG ( ONCE )  DAILY.   NO OTHER CHANGES WITH CURRENT MEDICATIONS    Your physician wants you to follow-up in [place] [address] [place]. You will receive a reminder letter in the mail two months in advance. If you don't receive a letter, please call our office to schedule the follow-up appointment.  If you need a refill on your cardiac medications before your next appointment, please call your pharmacy.

## 2016-12-02 NOTE — Assessment & Plan Note (Signed)
LDL was 63 Sept 2017

## 2016-12-02 NOTE — Assessment & Plan Note (Signed)
s/p PCI to LAD and RCA in 2004. The RCA is known to be occluded. He has had multiple interventions to the OM/ CFX- June 2012, Sept 2012, and April 2013. Basically a CFX full metal jacket. Myoview: January 2014: Complete resolution of ischemia previously noted, normal ejection fraction, no infarction.

## 2016-12-08 ENCOUNTER — Telehealth: Payer: Self-pay | Admitting: *Deleted

## 2016-12-08 NOTE — Telephone Encounter (Signed)
Spoke to patient who verbalized his thanks, and understanding of recommendations.

## 2016-12-08 NOTE — Telephone Encounter (Signed)
-----   Message from Erlene Quan, [place] sent at 12/08/2016  4:02 PM EST ----- Can you let [name] know I spoke with Dr Ellyn Hack and he wants the pt to continue Plavix and Coumadin.  Kerin Ransom PA-C 12/08/2016 4:03 PM

## 2017-01-18 ENCOUNTER — Other Ambulatory Visit: Payer: Self-pay | Admitting: Cardiology

## 2017-01-18 ENCOUNTER — Ambulatory Visit (INDEPENDENT_AMBULATORY_CARE_PROVIDER_SITE_OTHER): Payer: Medicare Other | Admitting: Pharmacist Clinician (PhC)/ Clinical Pharmacy Specialist

## 2017-01-18 DIAGNOSIS — I6522 Occlusion and stenosis of left carotid artery: Secondary | ICD-10-CM

## 2017-01-18 DIAGNOSIS — I4891 Unspecified atrial fibrillation: Secondary | ICD-10-CM | POA: Diagnosis not present

## 2017-01-18 DIAGNOSIS — Z7901 Long term (current) use of anticoagulants: Secondary | ICD-10-CM | POA: Diagnosis not present

## 2017-01-18 DIAGNOSIS — I480 Paroxysmal atrial fibrillation: Secondary | ICD-10-CM

## 2017-01-18 LAB — POCT INR: INR: 3.4

## 2017-01-24 ENCOUNTER — Other Ambulatory Visit: Payer: Self-pay | Admitting: Cardiology

## 2017-01-24 DIAGNOSIS — I10 Essential (primary) hypertension: Secondary | ICD-10-CM

## 2017-01-25 NOTE — Telephone Encounter (Signed)
Rx has been sent to the pharmacy electronically. ° °

## 2017-02-23 ENCOUNTER — Other Ambulatory Visit: Payer: Self-pay | Admitting: Cardiology

## 2017-02-24 NOTE — Telephone Encounter (Signed)
Rx(s) sent to pharmacy electronically.  

## 2017-03-02 ENCOUNTER — Ambulatory Visit (INDEPENDENT_AMBULATORY_CARE_PROVIDER_SITE_OTHER): Payer: Medicare Other | Admitting: Pharmacist Clinician (PhC)/ Clinical Pharmacy Specialist

## 2017-03-02 DIAGNOSIS — Z7901 Long term (current) use of anticoagulants: Secondary | ICD-10-CM | POA: Diagnosis not present

## 2017-03-02 DIAGNOSIS — I4891 Unspecified atrial fibrillation: Secondary | ICD-10-CM

## 2017-03-02 DIAGNOSIS — I480 Paroxysmal atrial fibrillation: Secondary | ICD-10-CM

## 2017-03-02 DIAGNOSIS — I6522 Occlusion and stenosis of left carotid artery: Secondary | ICD-10-CM

## 2017-03-02 LAB — POCT INR: INR: 2.5

## 2017-03-08 ENCOUNTER — Other Ambulatory Visit: Payer: Self-pay | Admitting: Cardiovascular Disease

## 2017-04-08 ENCOUNTER — Ambulatory Visit (INDEPENDENT_AMBULATORY_CARE_PROVIDER_SITE_OTHER): Payer: Medicare Other | Admitting: Pharmacist

## 2017-04-08 DIAGNOSIS — I480 Paroxysmal atrial fibrillation: Secondary | ICD-10-CM

## 2017-04-08 DIAGNOSIS — I6522 Occlusion and stenosis of left carotid artery: Secondary | ICD-10-CM

## 2017-04-08 DIAGNOSIS — I4891 Unspecified atrial fibrillation: Secondary | ICD-10-CM

## 2017-04-08 DIAGNOSIS — Z7901 Long term (current) use of anticoagulants: Secondary | ICD-10-CM

## 2017-04-08 LAB — POCT INR: INR: 4.7

## 2017-04-20 ENCOUNTER — Ambulatory Visit (INDEPENDENT_AMBULATORY_CARE_PROVIDER_SITE_OTHER): Payer: Medicare Other | Admitting: Pharmacist

## 2017-04-20 DIAGNOSIS — I6522 Occlusion and stenosis of left carotid artery: Secondary | ICD-10-CM | POA: Diagnosis not present

## 2017-04-20 DIAGNOSIS — I480 Paroxysmal atrial fibrillation: Secondary | ICD-10-CM

## 2017-04-20 DIAGNOSIS — Z7901 Long term (current) use of anticoagulants: Secondary | ICD-10-CM | POA: Diagnosis not present

## 2017-04-20 DIAGNOSIS — I4891 Unspecified atrial fibrillation: Secondary | ICD-10-CM

## 2017-04-20 LAB — POCT INR: INR: 2.6

## 2017-05-19 ENCOUNTER — Other Ambulatory Visit: Payer: Self-pay | Admitting: Cardiology

## 2017-05-19 NOTE — Telephone Encounter (Signed)
E SENT TO PHARMACY

## 2017-05-21 ENCOUNTER — Ambulatory Visit (INDEPENDENT_AMBULATORY_CARE_PROVIDER_SITE_OTHER): Payer: Medicare Other | Admitting: Pharmacist

## 2017-05-21 DIAGNOSIS — I480 Paroxysmal atrial fibrillation: Secondary | ICD-10-CM

## 2017-05-21 DIAGNOSIS — Z7901 Long term (current) use of anticoagulants: Secondary | ICD-10-CM

## 2017-05-21 DIAGNOSIS — I6522 Occlusion and stenosis of left carotid artery: Secondary | ICD-10-CM

## 2017-05-21 DIAGNOSIS — I4891 Unspecified atrial fibrillation: Secondary | ICD-10-CM | POA: Diagnosis not present

## 2017-05-21 LAB — POCT INR: INR: 3.3

## 2017-06-14 ENCOUNTER — Other Ambulatory Visit: Payer: Self-pay | Admitting: Cardiology

## 2017-06-21 ENCOUNTER — Ambulatory Visit (INDEPENDENT_AMBULATORY_CARE_PROVIDER_SITE_OTHER): Payer: Medicare Other | Admitting: Pharmacist

## 2017-06-21 DIAGNOSIS — I4891 Unspecified atrial fibrillation: Secondary | ICD-10-CM

## 2017-06-21 DIAGNOSIS — Z7901 Long term (current) use of anticoagulants: Secondary | ICD-10-CM

## 2017-06-21 DIAGNOSIS — I6522 Occlusion and stenosis of left carotid artery: Secondary | ICD-10-CM

## 2017-06-21 DIAGNOSIS — I480 Paroxysmal atrial fibrillation: Secondary | ICD-10-CM | POA: Diagnosis not present

## 2017-06-21 LAB — POCT INR: INR: 3.9

## 2017-06-30 ENCOUNTER — Encounter: Payer: Self-pay | Admitting: Cardiology

## 2017-06-30 ENCOUNTER — Ambulatory Visit (INDEPENDENT_AMBULATORY_CARE_PROVIDER_SITE_OTHER): Payer: Medicare Other | Admitting: Cardiology

## 2017-06-30 ENCOUNTER — Ambulatory Visit (INDEPENDENT_AMBULATORY_CARE_PROVIDER_SITE_OTHER): Payer: Medicare Other | Admitting: Pharmacist Clinician (PhC)/ Clinical Pharmacy Specialist

## 2017-06-30 VITALS — BP 136/77 | HR 46 | Ht 75.0 in | Wt 256.2 lb

## 2017-06-30 DIAGNOSIS — Z9861 Coronary angioplasty status: Secondary | ICD-10-CM

## 2017-06-30 DIAGNOSIS — I4891 Unspecified atrial fibrillation: Secondary | ICD-10-CM

## 2017-06-30 DIAGNOSIS — Z79899 Other long term (current) drug therapy: Secondary | ICD-10-CM

## 2017-06-30 DIAGNOSIS — I6522 Occlusion and stenosis of left carotid artery: Secondary | ICD-10-CM

## 2017-06-30 DIAGNOSIS — I2510 Atherosclerotic heart disease of native coronary artery without angina pectoris: Secondary | ICD-10-CM

## 2017-06-30 DIAGNOSIS — I480 Paroxysmal atrial fibrillation: Secondary | ICD-10-CM

## 2017-06-30 DIAGNOSIS — E785 Hyperlipidemia, unspecified: Secondary | ICD-10-CM

## 2017-06-30 DIAGNOSIS — E669 Obesity, unspecified: Secondary | ICD-10-CM | POA: Diagnosis not present

## 2017-06-30 DIAGNOSIS — I10 Essential (primary) hypertension: Secondary | ICD-10-CM | POA: Diagnosis not present

## 2017-06-30 DIAGNOSIS — Z7901 Long term (current) use of anticoagulants: Secondary | ICD-10-CM | POA: Diagnosis not present

## 2017-06-30 LAB — POCT INR: INR: 2.7

## 2017-06-30 MED ORDER — METOPROLOL SUCCINATE ER 25 MG PO TB24: 25.0000 mg | ORAL_TABLET | Freq: Every day | ORAL | 3 refills | Status: DC

## 2017-06-30 MED ORDER — APIXABAN 5 MG PO TABS: 5.0000 mg | ORAL_TABLET | Freq: Two times a day (BID) | ORAL | 2 refills | Status: DC

## 2017-06-30 NOTE — Patient Instructions (Addendum)
MEDICATION CHANGE  STOP WARFARIN  START ELIQUIS TOMORROW EVENING -- TAKE 1 TABLET TWICE A DAY.  TAKE TOPROL XL 25 MG AT BEDTIME ONLY ( CHANGE FROM TWICE A DAY)      Your physician recommends that you schedule a follow-up appointment in [place]    Your physician wants you to follow-up in [place].You will receive a reminder letter in the mail two months in advance. If you don't receive a letter, please call our office to schedule the follow-up appointment.  If you need a refill on your cardiac medications before your next appointment, please call your pharmacy.

## 2017-06-30 NOTE — Progress Notes (Signed)
PCP: Charles Redwood, MD  Clinic Note: Chief Complaint  Patient presents with  . Follow-up    12 MONTHS; Pt states no Sx.   . Coronary Artery Disease    Extensive PCI  . Atrial Fibrillation    HPI: Charles Robinson is a 71 y.o. male with a PMH below who presents today for A six-month follow-up with myself and Charles Robinson, [place].  Charles Robinson was last seen in Jan 2018 by Mr. Charles Robinson, [place] - was doing well at that time. No changes made. Was not noticing any, occasions with atrial fibrillation. No anginal pain. No heart failure. Very active. No symptoms of chronotropic incompetence. -- Had just returned from fly fishing in [place]: None  Studies Personally Reviewed - (if available, images/films reviewed: From Epic Chart or Care Everywhere)  None  Interval History: "Charles Robinson: presents today overall doing quite well with no major complaints. He remains active going to the [facility]. He does yoga, treadmill as well as exercise classes. Always been on the go. He still goes fishing. He denies any sensation of irregular heartbeats or palpitations. Normal edema but no PND or orthopnea. He denies any anginal chest discomfort or dyspnea with rest or exertion. No syncope/near-syncope or TIA/amaurosis fugax. He is tolerating his anticoagulation without any significant bleeding issues. He does bruise somewhat easily, but not uncontrollable.  No melena, hematochezia, hematuria, or epstaxis. No claudication.  ROS: A comprehensive was performed. Review of Systems  Constitutional: Negative for fever and malaise/fatigue.  HENT: Negative for nosebleeds.   Gastrointestinal: Negative for blood in stool and melena.  Genitourinary: Negative for hematuria.  Musculoskeletal: Positive for back pain (occasionally). Negative for joint pain.  Neurological: Negative for dizziness and loss of consciousness.  Endo/Heme/Allergies: Bruises/bleeds easily.  Psychiatric/Behavioral: Negative for  memory loss. The patient does not have insomnia.   All other systems reviewed and are negative.   I have reviewed and (if needed) personally updated the patient's problem list, medications, allergies, past medical and surgical history, social and family history.   Past Medical History:  Diagnosis Date  . Arthritis    back  . BPH (benign prostatic hypertrophy)   . Bruit 08/01/2009   doppler - no suggestion of significant diameter reduction, dissection or aneurysmal dilation.  normal evaluation  . CAD S/P multivessel PCI: LAD, RCA and extensive LCX-OM1 03/15/2012   s/p PCI to LAD, to RCA (now occluded), then extensive PCI to Dominant LCx-OM1  (enitre prox-AVG Circ for dissection in 07/2011)  . Cancer ([place])    SKIN CA  . GERD (gastroesophageal reflux disease)   . History of colon polyps   . History of echocardiogram 08/16/2011   Echo - EF 60-65%; moderate LV concentric hypertrophy; abdnormal LV relaxation (grade 1 diastolic dysfunction; ascending aorta mildly dilated; LA moderately dilated;   . Hyperlipidemia   . Hypertension   . PAF (paroxysmal atrial fibrillation) ([place]) 12/20/2012   followed by Dr. Glenetta Hew; s/p DCCV 2011, 07/2011 post PCI with Type 4a MI; CHA2DS2Vasc = 3, on Warfarin  . Paroxysmal atrial flutter ([place]) 08/20/2011   TEE- atrial septum - no defect identified; RA normal in size, no evidence of thrombus; ascending aorta normal  . Rupture quadriceps tendon     Past Surgical History:  Procedure Laterality Date  . BACK SURGERY    . COLONOSCOPY    . CORONARY ANGIOPLASTY WITH STENT PLACEMENT  03/15/2012   multiple stens in AVGroove Circ & OM1;; 2 site PTCA w/ PTCA  and stenting of OM2 ang PTCA of distal stent ISR followed by PTCA of mid LCx ISR  . CORONARY STENT PLACEMENT  2003 - 2011 - 2012 - 2013   Pre-2012 - BMS in RCA now known occluded, BMS in proximal LAD; 07/2011 - 2.25 x 23 BMS in OM 1 with significant disease on either side noted shortly after -->  2 additional  overlapping DES 2.5 mm x 30 mm and 2.25 mm x 26 mm in OM1,; 3 overlapping Resolute DES in AV groove Cx crossing OM1: (Tapered from 3.8-2.6 mm)- Resolute DES 3.5 x 22, 3.0 x 38, 2.5 x 14  . DOPPLER ECHOCARDIOGRAPHY  07/2011   EF ~60-65%, Grade 1 D Dysfxn; mod Conc LVH  . ENDARTERECTOMY Left 02/27/2016   Procedure: LEFT  CAROTID ARTERY ENDARTERECTOMY ;  Surgeon: Serafina [place], MD;  Location: [place];  Service: Vascular;  Laterality: Left;  . LEFT HEART CATHETERIZATION WITH CORONARY ANGIOGRAM N/A 03/15/2012   Procedure: LEFT HEART CATHETERIZATION WITH CORONARY ANGIOGRAM;  Surgeon: [name], MD;  Location: [facility] CATH LAB: patent LAD stents, patent LCx stents w/80% focal ISR just distal to OM; OM proximal stent open; -- 2 site PTCA-PCI  . LUMBAR LAMINECTOMY/DECOMPRESSION MICRODISCECTOMY Bilateral 12/11/2014   Procedure: Bilateral Lumbar Three-Four Laminectomy;  Surgeon: Kristeen Miss, MD;  Location: [facility]O ORS;  Service: Neurosurgery;  Laterality: Bilateral;  bilateral  . NM MYOVIEW LTD  11/2012   No ischemia or infarction, normal EF & WM  . PATCH ANGIOPLASTY Left 02/27/2016   Procedure: WITH HEMASHIELD DACRON  PATCH ANGIOPLASTY;  Surgeon: Serafina [place], MD;  Location: [place];  Service: Vascular;  Laterality: Left;  [name] QUADRICEPS TENDON REPAIR  09/19/2012   Procedure: REPAIR QUADRICEP TENDON;  Surgeon: [name], MD;  Location: [facility];  Service: Orthopedics;  Laterality: Left;  [name] VASECTOMY      Current Meds  Medication Sig  . amiodarone (PACERONE) 200 MG tablet TAKE 1 TABLET EVERY DAY  . clopidogrel (PLAVIX) 75 MG tablet TAKE 1 TABLET BY MOUTH EVERY DAY  . Coenzyme Q10 200 MG capsule Take 200 mg by mouth every morning.   [name] doxazosin (CARDURA) 8 MG tablet Take 4 mg by mouth every evening.   . ezetimibe (ZETIA) 10 MG tablet TAKE 1 TABLET (10 MG TOTAL) BY MOUTH DAILY.  . finasteride (PROSCAR) 5 MG tablet Take 5 mg by mouth every evening.   [name] lisinopril (PRINIVIL,ZESTRIL) 10 MG tablet Take 1  tablet (10 mg total) by mouth daily.  [name] lisinopril (PRINIVIL,ZESTRIL) 10 MG tablet TAKE 1 TABLET BY MOUTH DAILY  . Multiple Vitamin (MULTIVITAMIN WITH MINERALS) TABS tablet Take 1 tablet by mouth every morning.   . nitroGLYCERIN (NITROSTAT) 0.4 MG SL tablet PLACE 1 TABLET (0.4 MG TOTAL) UNDER THE TONGUE EVERY 5 (FIVE) MINUTES AS NEEDED. FOR CHEST PAIN  . Omega-3 Fatty Acids (FISH OIL) 1000 MG CAPS Take 1,000 capsules by mouth 2 (two) times daily.   . rosuvastatin (CRESTOR) 40 MG tablet TAKE 1 TABLET BY MOUTH DAILY  . [DISCONTINUED] metoprolol succinate (TOPROL-XL) 25 MG 24 hr tablet TAKE 1 TABLET TWICE A DAY  . [DISCONTINUED] warfarin (COUMADIN) 5 MG tablet TAKE 1 TABLET BY MOUTH DAILY OR AS DIRECTED BY COUMADIN CLINIC    Allergies  Allergen Reactions  . Lipitor [Atorvastatin] Other (See Comments)    Increased HR is currently tolerating --   . Morphine And Related Nausea And Vomiting  . Vicodin [Hydrocodone-Acetaminophen] Other (See Comments)    Feel  crazy    Social History   Social History  . Marital status: Married    Spouse name: N/A  . Number of children: N/A  . Years of education: N/A   Social History Main Topics  . Smoking status: Never Smoker  . Smokeless tobacco: Never Used  . Alcohol use 1.2 oz/week    2 Cans of beer per week     Comment: rare  . Drug use: No  . Sexual activity: Not Currently   Other Topics Concern  . None   Social History Narrative   [name] is a father of 2, grandfather of 85.  His daughter was classmates with Dr. Ellyn Hack in college.   He and his wife are currently in the process of moving houses, further down the street in order to downsize to a single-story house.  He has been working out at [employer].          family history includes Heart disease in his father.  Wt Readings from Last 3 Encounters:  06/30/17 256 lb 3.2 oz (116.2 kg)  12/02/16 275 lb 6.4 oz (124.9 kg)  09/28/16 275 lb (124.7 kg)    PHYSICAL EXAM BP 136/77   Pulse (!)  46   Ht 6\' 3"  (1.905 m)   Wt 256 lb 3.2 oz (116.2 kg)   BMI 32.02 kg/m  Physical Exam  Constitutional: He is oriented to person, place, and time. He appears well-developed and well-nourished. No distress.  Mildly obese; seems to be down post 20 pounds from January.  HENT:  Head: Normocephalic and atraumatic.  Nose: Nose normal.  Mouth/Throat: Oropharynx is clear and moist. No oropharyngeal exudate.  Eyes: Conjunctivae and EOM are normal.  Neck: Normal range of motion. Neck supple. No hepatojugular reflux and no JVD present. Carotid bruit is not present.  Cardiovascular: Regular rhythm and intact distal pulses.   No extrasystoles are present. Bradycardia present.  PMI is not displaced.  Exam reveals distant heart sounds. Exam reveals no gallop, no friction rub, no midsystolic click and no opening snap.   No murmur heard. Pulmonary/Chest: Effort normal and breath sounds normal. No respiratory distress. He has no wheezes. He has no rales.  Abdominal: Soft. Bowel sounds are normal. He exhibits no distension.  Musculoskeletal: Normal range of motion. He exhibits no edema.  Neurological: He is alert and oriented to person, place, and time. No cranial nerve deficit.  Skin: Skin is warm and dry. No rash noted. He is not diaphoretic. No erythema.  Psychiatric: He has a normal mood and affect. His behavior is normal. Judgment and thought content normal.  Nursing note and vitals reviewed.  Adult ECG Report  Rate: 46 ;  Rhythm: sinus bradycardia and 1st Deg AVB;   Narrative Interpretation: otherwise normal.   Other studies Reviewed: Additional studies/ records that were reviewed today include:  Recent Labs:  Not available.  PCP checked labs in May.    ASSESSMENT / PLAN: Problem List Items Addressed This Visit    CAD, s/p multiple PCIs - Primary (Chronic)    Distant history of occluded RCA. Patent stent in the LAD. Extensive stenting of the circumflex-OM1 most recently with ISR PTCA in the  circumflex and OM as well as a stent placed in the segment that was not covered originally by stent. Most recent Myoview in 2014 was negative for ischemia. He is not having any anginal symptoms. Continues to be active. He is on Plavix along with warfarin. He is on stable dose of  rosuvastatin plus Zetia along with Toprol an ACE inhibitor.  - Only change today will be to reduce dosing of Toprol to once daily      Relevant Medications   apixaban (ELIQUIS) 5 MG TABS tablet   metoprolol succinate (TOPROL XL) 25 MG 24 hr tablet   Other Relevant Orders   EKG 12-Lead (Completed)   Dyslipidemia, goal LDL below 70 (Chronic)    I have not seen labs recently, but as of September last year his LDL was 63. He is on Crestor 40 mg daily plus Zetia. We are trying to get labs from his PCP.      Relevant Medications   apixaban (ELIQUIS) 5 MG TABS tablet   metoprolol succinate (TOPROL XL) 25 MG 24 hr tablet   Essential hypertension (Chronic)    Controlled with beta blocker and metoprolol. I think we will lose much blood pressure control by reducing Toprol to once daily. If so, we can increase his ACE inhibitor.      Relevant Medications   apixaban (ELIQUIS) 5 MG TABS tablet   metoprolol succinate (TOPROL XL) 25 MG 24 hr tablet   Left carotid artery stenosis (Chronic)    Status post carotid endarterectomy - followed by [name]      Relevant Medications   apixaban (ELIQUIS) 5 MG TABS tablet   metoprolol succinate (TOPROL XL) 25 MG 24 hr tablet   Long term current use of anticoagulant therapy (Chronic)    Converted from Coumadin to [facility] today.      Obesity (BMI 30-39.9) (Chronic)    His weights have been up and down, but he is now back down to a more stable weight. Still has more week ago. He is exercising now much more than he was before. So watching his diet but having difficulty avoiding his "vices "      On amiodarone therapy (Chronic)    Will need annual follow-up of LFTs and TSH as  well as CRP Last PFTs were checked in 2017, we can probably check every other year. Would recommend CRP added to routine labs checked by PCP as a sign of potential inflammation from amiodarone.  Needs annual eye checks       PAF (paroxysmal atrial fibrillation) (HCC) : CHA2DS2-VASc Score - converting to DOAC (Chronic)    Maintaining sinus rhythm on amiodarone. He is bradycardic today, I will reduce his Toprol to once daily. No significant bleeding issues on Coumadin --> after discussing with our pharmacy team today, the plan is to convert him from warfarin to [facility]. I did tell him that if he has a significant bruise, he can hold Plavix for a couple days.      Relevant Medications   apixaban (ELIQUIS) 5 MG TABS tablet   metoprolol succinate (TOPROL XL) 25 MG 24 hr tablet   Other Relevant Orders   EKG 12-Lead (Completed)      Current medicines are reviewed at length with the patient today. (+/- concerns) No complaints The following changes have been made: Reduced dose of Toprol to once daily  Patient Instructions  MEDICATION CHANGE  STOP WARFARIN  START ELIQUIS TOMORROW EVENING -- TAKE 1 TABLET TWICE A DAY.  TAKE TOPROL XL 25 MG AT BEDTIME ONLY ( CHANGE FROM TWICE A DAY)      Your physician recommends that you schedule a follow-up appointment in [place]    Your physician wants you to follow-up in [place].You will receive a  reminder letter in the mail two months in advance. If you don't receive a letter, please call our office to schedule the follow-up appointment.  If you need a refill on your cardiac medications before your next appointment, please call your pharmacy.    Studies Ordered:   Orders Placed This Encounter  Procedures  . EKG 12-Lead      Glenetta Hew, M.D., M.S. Interventional Cardiologist   Pager # [phone] Phone # [phone] [address] [place], [place] [address]

## 2017-07-02 ENCOUNTER — Encounter: Payer: Self-pay | Admitting: Cardiology

## 2017-07-02 NOTE — Assessment & Plan Note (Signed)
I have not seen labs recently, but as of September last year his LDL was 63. He is on Crestor 40 mg daily plus Zetia. We are trying to get labs from his PCP.

## 2017-07-02 NOTE — Assessment & Plan Note (Signed)
Controlled with beta blocker and metoprolol. I think we will lose much blood pressure control by reducing Toprol to once daily. If so, we can increase his ACE inhibitor.

## 2017-07-02 NOTE — Assessment & Plan Note (Signed)
His weights have been up and down, but he is now back down to a more stable weight. Still has more week ago. He is exercising now much more than he was before. So watching his diet but having difficulty avoiding his "vices "

## 2017-07-02 NOTE — Assessment & Plan Note (Signed)
Converted from Coumadin to [facility] today.

## 2017-07-02 NOTE — Assessment & Plan Note (Signed)
Will need annual follow-up of LFTs and TSH as well as CRP Last PFTs were checked in 2017, we can probably check every other year. Would recommend CRP added to routine labs checked by PCP as a sign of potential inflammation from amiodarone.  Needs annual eye checks

## 2017-07-02 NOTE — Assessment & Plan Note (Signed)
Distant history of occluded RCA. Patent stent in the LAD. Extensive stenting of the circumflex-OM1 most recently with ISR PTCA in the circumflex and OM as well as a stent placed in the segment that was not covered originally by stent. Most recent Myoview in 2014 was negative for ischemia. He is not having any anginal symptoms. Continues to be active. He is on Plavix along with warfarin. He is on stable dose of rosuvastatin plus Zetia along with Toprol an ACE inhibitor.  - Only change today will be to reduce dosing of Toprol to once daily

## 2017-07-02 NOTE — Assessment & Plan Note (Addendum)
Maintaining sinus rhythm on amiodarone. He is bradycardic today, I will reduce his Toprol to once daily. No significant bleeding issues on Coumadin --> after discussing with our pharmacy team today, the plan is to convert him from warfarin to [facility]. I did tell him that if he has a significant bruise, he can hold Plavix for a couple days.

## 2017-07-02 NOTE — Assessment & Plan Note (Signed)
Status post carotid endarterectomy - followed by [name]

## 2017-07-10 ENCOUNTER — Other Ambulatory Visit: Payer: Self-pay | Admitting: Cardiology

## 2017-07-12 NOTE — Telephone Encounter (Signed)
Rx(s) sent to pharmacy electronically.  

## 2017-08-12 ENCOUNTER — Other Ambulatory Visit: Payer: Self-pay | Admitting: Cardiology

## 2017-10-04 ENCOUNTER — Ambulatory Visit: Payer: Medicare Other | Admitting: Surgery

## 2017-10-04 ENCOUNTER — Encounter (HOSPITAL_COMMUNITY): Payer: Medicare Other

## 2017-10-18 ENCOUNTER — Encounter: Payer: Self-pay | Admitting: Surgery

## 2017-10-18 ENCOUNTER — Ambulatory Visit (INDEPENDENT_AMBULATORY_CARE_PROVIDER_SITE_OTHER): Payer: Medicare Other | Admitting: Surgery

## 2017-10-18 ENCOUNTER — Ambulatory Visit (HOSPITAL_COMMUNITY)
Admission: RE | Admit: 2017-10-18 | Discharge: 2017-10-18 | Disposition: A | Discharge status: Home / Self Care | Payer: Medicare Other | Source: Ambulatory Visit | Attending: Surgery | Admitting: Surgery

## 2017-10-18 VITALS — BP 129/71 | HR 61 | Temp 98.5°F | Resp 16 | Ht 75.0 in | Wt 267.0 lb

## 2017-10-18 DIAGNOSIS — I6522 Occlusion and stenosis of left carotid artery: Secondary | ICD-10-CM | POA: Diagnosis not present

## 2017-10-18 LAB — VAS US CAROTID
LEFT ECA DIAS: -10 cm/s
Left CCA dist dias: -14 cm/s
Left CCA dist sys: -76 cm/s
Left CCA prox dias: 17 cm/s
Left CCA prox sys: 103 cm/s
Left ICA dist dias: -13 cm/s
Left ICA dist sys: -76 cm/s
Left ICA prox dias: -10 cm/s
Left ICA prox sys: -40 cm/s
RIGHT CCA MID DIAS: -20 cm/s
RIGHT ECA DIAS: -13 cm/s
Right CCA prox dias: 27 cm/s
Right CCA prox sys: 109 cm/s
Right cca dist sys: -47 cm/s

## 2017-10-18 NOTE — Progress Notes (Signed)
Vascular and Vein Specialist of [facility]  Patient name: Charles Robinson MRN: [id number] DOB: October 18, 1946 Sex: male   REASON FOR VISIT:    Follow up  [place] ILLNESS:    Charles Robinson is a 71 y.o. male who returns today for follow-up.  He is status post left carotid endarterectomy on 02/27/2016.  This was done for asymptomatic stenosis.  The lesion and plaque extended up to the skull base.  There is also a significant inflammatory reaction around the internal carotid artery.  I could not place a shunt because of how distal the lesion was.  He tolerated his operation without any significant difficulties.  He is back today for follow-up  He reports that occasionally he will have an issue with swallowing very briefly.  He will cough and it will go away.  This does not bother him.  He denies any other neurologic symptoms  PAST MEDICAL HISTORY:   Past Medical History:  Diagnosis Date  . Arthritis    back  . BPH (benign prostatic hypertrophy)   . Bruit 08/01/2009   doppler - no suggestion of significant diameter reduction, dissection or aneurysmal dilation.  normal evaluation  . CAD S/P multivessel PCI: LAD, RCA and extensive LCX-OM1 03/15/2012   s/p PCI to LAD, to RCA (now occluded), then extensive PCI to Dominant LCx-OM1  (enitre prox-AVG Circ for dissection in 07/2011)  . Cancer ([place])    SKIN CA  . GERD (gastroesophageal reflux disease)   . History of colon polyps   . History of echocardiogram 08/16/2011   Echo - EF 60-65%; moderate LV concentric hypertrophy; abdnormal LV relaxation (grade 1 diastolic dysfunction; ascending aorta mildly dilated; LA moderately dilated;   . Hyperlipidemia   . Hypertension   . PAF (paroxysmal atrial fibrillation) ([place]) 12/20/2012   followed by Dr. Glenetta Hew; s/p DCCV 2011, 07/2011 post PCI with Type 4a MI; CHA2DS2Vasc = 3, on Warfarin  . Paroxysmal atrial flutter ([place]) 08/20/2011   TEE- atrial septum - no  defect identified; RA normal in size, no evidence of thrombus; ascending aorta normal  . Rupture quadriceps tendon      FAMILY HISTORY:   Family History  Problem Relation Age of Onset  . Heart disease Father     SOCIAL HISTORY:   Social History   Tobacco Use  . Smoking status: Never Smoker  . Smokeless tobacco: Never Used  Substance Use Topics  . Alcohol use: Yes    Alcohol/week: 1.2 oz    Types: 2 Cans of beer per week    Comment: rare     ALLERGIES:   Allergies  Allergen Reactions  . Lipitor [Atorvastatin] Other (See Comments)    Increased HR is currently tolerating --   . Morphine And Related Nausea And Vomiting  . Vicodin [Hydrocodone-Acetaminophen] Other (See Comments)    Feel crazy     CURRENT MEDICATIONS:   Current Outpatient Medications  Medication Sig Dispense Refill  . amiodarone (PACERONE) 200 MG tablet TAKE 1 TABLET EVERY DAY 90 tablet 2  . apixaban (ELIQUIS) 5 MG TABS tablet Take 1 tablet (5 mg total) by mouth 2 (two) times daily. 180 tablet 2  . clopidogrel (PLAVIX) 75 MG tablet TAKE 1 TABLET BY MOUTH EVERY DAY 90 tablet 2  . Coenzyme Q10 200 MG capsule Take 200 mg by mouth every morning.     [name] doxazosin (CARDURA) 8 MG tablet Take 4 mg by mouth every evening.     . ezetimibe (ZETIA)  10 MG tablet TAKE 1 TABLET (10 MG TOTAL) BY MOUTH DAILY. 90 tablet 3  . finasteride (PROSCAR) 5 MG tablet Take 5 mg by mouth every evening.     . metoprolol succinate (TOPROL XL) 25 MG 24 hr tablet Take 1 tablet (25 mg total) by mouth at bedtime. 90 tablet 3  . Multiple Vitamin (MULTIVITAMIN WITH MINERALS) TABS tablet Take 1 tablet by mouth every morning.     . nitroGLYCERIN (NITROSTAT) 0.4 MG SL tablet PLACE 1 TABLET (0.4 MG TOTAL) UNDER THE TONGUE EVERY 5 (FIVE) MINUTES AS NEEDED. FOR CHEST PAIN 25 tablet 4  . Omega-3 Fatty Acids (FISH OIL) 1000 MG CAPS Take 1,000 capsules by mouth 2 (two) times daily.     . rosuvastatin (CRESTOR) 40 MG tablet TAKE 1 TABLET BY MOUTH  DAILY 90 tablet 2  . lisinopril (PRINIVIL,ZESTRIL) 10 MG tablet Take 1 tablet (10 mg total) by mouth daily. 90 tablet 3  . lisinopril (PRINIVIL,ZESTRIL) 10 MG tablet TAKE 1 TABLET BY MOUTH EVERY DAY 30 tablet 6   No current facility-administered medications for this visit.     REVIEW OF SYSTEMS:   [X]  denotes positive finding, [ ]  denotes negative finding Cardiac  Comments:  Chest pain or chest pressure:    Shortness of breath upon exertion:    Short of breath when lying flat:    Irregular heart rhythm:        Vascular    Pain in calf, thigh, or hip brought on by ambulation:    Pain in feet at night that wakes you up from your sleep:     Blood clot in your veins:    Leg swelling:         Pulmonary    Oxygen at home:    Productive cough:     Wheezing:         Neurologic    Sudden weakness in arms or legs:     Sudden numbness in arms or legs:     Sudden onset of difficulty speaking or slurred speech:    Temporary loss of vision in one eye:     Problems with dizziness:         Gastrointestinal    Blood in stool:     Vomited blood:         Genitourinary    Burning when urinating:     Blood in urine:        Psychiatric    Major depression:         Hematologic    Bleeding problems:    Problems with blood clotting too easily:        Skin    Rashes or ulcers:        Constitutional    Fever or chills:      PHYSICAL EXAM:   Vitals:   10/18/17 1455 10/18/17 1457  BP: 132/77 129/71  Pulse: 61   Resp: 16   Temp: 98.5 F (36.9 C)   TempSrc: Oral   SpO2: 97%   Weight: 267 lb (121.1 kg)   Height: 6\' 3"  (1.905 m)     GENERAL: The patient is a well-nourished male, in no acute distress. The vital signs are documented above. CARDIAC: There is a regular rate and rhythm.  VASCULAR: No carotid bruits.  Palpable posterior tibial pulse PULMONARY: Non-labored respirations MUSCULOSKELETAL: There are no major deformities or cyanosis. NEUROLOGIC: No focal weakness or  paresthesias are detected. SKIN: There are no ulcers or rashes noted.  PSYCHIATRIC: The patient has a normal affect.  STUDIES:   I have ordered and reviewed his carotid duplex.  This shows a widely patent left carotid endarterectomy site.  There is 1-39% stenosis on the right  MEDICAL ISSUES:   Status post left carotid endarterectomy: The patient is doing well without evidence of restenosis.  He will occasionally had issues with swallowing.  I offered him a referral to ear nose and throat, however he does not think that this is a significant problem and does not bother him.  He will contact me if he gets worse.  Otherwise, he will follow-up in 1 year with a repeat carotid duplex.    Annamarie Major, MD [facility]ascular and Vein Specialists of [facility] [phone] Pager [phone]

## 2017-10-20 NOTE — Addendum Note (Signed)
Addended by: Lianne Cure A on: 10/20/2017 09:37 AM   Modules accepted: Orders

## 2017-11-01 ENCOUNTER — Other Ambulatory Visit: Payer: Self-pay | Admitting: Cardiology

## 2017-11-15 ENCOUNTER — Other Ambulatory Visit: Payer: Self-pay | Admitting: Cardiology

## 2017-11-15 NOTE — Telephone Encounter (Signed)
REFILL 

## 2018-01-06 ENCOUNTER — Other Ambulatory Visit: Payer: Self-pay | Admitting: *Deleted

## 2018-01-06 MED ORDER — EZETIMIBE 10 MG PO TABS: 10.0000 mg | ORAL_TABLET | Freq: Every day | ORAL | 0 refills | Status: DC

## 2018-01-11 ENCOUNTER — Ambulatory Visit (INDEPENDENT_AMBULATORY_CARE_PROVIDER_SITE_OTHER): Payer: Medicare Other | Admitting: Cardiology

## 2018-01-11 ENCOUNTER — Encounter: Payer: Self-pay | Admitting: Cardiology

## 2018-01-11 ENCOUNTER — Ambulatory Visit: Payer: Medicare Other | Admitting: Cardiology

## 2018-01-11 VITALS — BP 148/86 | HR 51 | Ht 75.0 in | Wt 266.4 lb

## 2018-01-11 DIAGNOSIS — I480 Paroxysmal atrial fibrillation: Secondary | ICD-10-CM

## 2018-01-11 NOTE — Progress Notes (Signed)
01/11/2018 Charles Robinson   1946/06/26  [id number]  Primary Physician [name], MD Primary Cardiologist: Dr Ellyn Hack   HPI:  72 y/o male previously followed by Dr Rex Kras, now followed by Dr Ellyn Hack. He has a history of CAD, he had an LAD stent in 2004, and a CFX/OM PCI June 2012. In September 2012 he was restudied for angina and had OM/CFX disease treated with PCI that was complicated by dissection. Dr Ellyn Hack ultimately got a good result. He was seen again in April 2013 after an abnormal Myoview suggested ISR. He was studied and had a stent placed to the OM. He essentially has a full metal jacket OM.CFX. Follow up Myoview in Jan 2014 was low risk. He has done well since his last PCI. He denies chest pain.   He has had PAF since Sept 2012 but has been holding NSR on Amiodarone. He has bradycardia but has been asymptomatic. His Toprol was cut back at his LOV in Aug 2018. He is now on Eliquis and Plavix. He is in the office for a 6 month check up. He denies chest pain or palpations. He denies unusual DOE or fatigue.    Current Outpatient Medications  Medication Sig Dispense Refill  . amiodarone (PACERONE) 200 MG tablet TAKE 1 TABLET EVERY DAY 90 tablet 2  . apixaban (ELIQUIS) 5 MG TABS tablet Take 1 tablet (5 mg total) by mouth 2 (two) times daily. 180 tablet 2  . clopidogrel (PLAVIX) 75 MG tablet TAKE 1 TABLET BY MOUTH EVERY DAY 90 tablet 2  . Coenzyme Q10 200 MG capsule Take 200 mg by mouth every morning.     [name] doxazosin (CARDURA) 8 MG tablet Take 4 mg by mouth every evening.     . ezetimibe (ZETIA) 10 MG tablet Take 1 tablet (10 mg total) by mouth daily. 90 tablet 0  . finasteride (PROSCAR) 5 MG tablet Take 5 mg by mouth every evening.     [name] lisinopril (PRINIVIL,ZESTRIL) 10 MG tablet TAKE 1 TABLET BY MOUTH EVERY DAY 30 tablet 6  . metoprolol succinate (TOPROL XL) 25 MG 24 hr tablet Take 1 tablet (25 mg total) by mouth at bedtime. 90 tablet 3  . Multiple Vitamin (MULTIVITAMIN WITH  MINERALS) TABS tablet Take 1 tablet by mouth every morning.     . nitroGLYCERIN (NITROSTAT) 0.4 MG SL tablet PLACE 1 TABLET (0.4 MG TOTAL) UNDER THE TONGUE EVERY 5 (FIVE) MINUTES AS NEEDED. FOR CHEST PAIN 25 tablet 4  . Omega-3 Fatty Acids (FISH OIL) 1000 MG CAPS Take 1,000 capsules by mouth 2 (two) times daily.     . rosuvastatin (CRESTOR) 40 MG tablet TAKE 1 TABLET BY MOUTH DAILY 90 tablet 2  . lisinopril (PRINIVIL,ZESTRIL) 10 MG tablet Take 1 tablet (10 mg total) by mouth daily. 90 tablet 3   No current facility-administered medications for this visit.     Allergies  Allergen Reactions  . Lipitor [Atorvastatin] Other (See Comments)    Increased HR is currently tolerating --   . Morphine And Related Nausea And Vomiting  . Vicodin [Hydrocodone-Acetaminophen] Other (See Comments)    Feel crazy    Past Medical History:  Diagnosis Date  . Arthritis    back  . BPH (benign prostatic hypertrophy)   . Bruit 08/01/2009   doppler - no suggestion of significant diameter reduction, dissection or aneurysmal dilation.  normal evaluation  . CAD S/P multivessel PCI: LAD, RCA and extensive LCX-OM1 03/15/2012   s/p PCI to LAD,  to RCA (now occluded), then extensive PCI to Dominant LCx-OM1  (enitre prox-AVG Circ for dissection in 07/2011)  . Cancer ([place])    SKIN CA  . GERD (gastroesophageal reflux disease)   . History of colon polyps   . History of echocardiogram 08/16/2011   Echo - EF 60-65%; moderate LV concentric hypertrophy; abdnormal LV relaxation (grade 1 diastolic dysfunction; ascending aorta mildly dilated; LA moderately dilated;   . Hyperlipidemia   . Hypertension   . PAF (paroxysmal atrial fibrillation) ([place]) 12/20/2012   followed by Dr. Glenetta Hew; s/p DCCV 2011, 07/2011 post PCI with Type 4a MI; CHA2DS2Vasc = 3, on Warfarin  . Paroxysmal atrial flutter ([facility]) 08/20/2011   TEE- atrial septum - no defect identified; RA normal in size, no evidence of thrombus; ascending aorta normal  .  Rupture quadriceps tendon     Social History   Socioeconomic History  . Marital status: Married    Spouse name: Not on file  . Number of children: Not on file  . Years of education: Not on file  . Highest education level: Not on file  Social Needs  . Financial resource strain: Not on file  . Food insecurity - worry: Not on file  . Food insecurity - inability: Not on file  . Transportation needs - medical: Not on file  . Transportation needs - non-medical: Not on file  Occupational History  . Not on file  Tobacco Use  . Smoking status: Never Smoker  . Smokeless tobacco: Never Used  Substance and Sexual Activity  . Alcohol use: Yes    Alcohol/week: 1.2 oz    Types: 2 Cans of beer per week    Comment: rare  . Drug use: No  . Sexual activity: Not Currently  Other Topics Concern  . Not on file  Social History Narrative   Charles Robinson is a father of 2, grandfather of 58.  His daughter was classmates with Dr. Ellyn Hack in college.   He and his wife are currently in the process of moving houses, further down the street in order to downsize to a single-story house.  He has been working out at [employer].           Family History  Problem Relation Age of Onset  . Heart disease Father      Review of Systems: General: negative for chills, fever, night sweats or weight changes.  Cardiovascular: negative for chest pain, dyspnea on exertion, edema, orthopnea, palpitations, paroxysmal nocturnal dyspnea or shortness of breath Dermatological: negative for rash Respiratory: negative for cough or wheezing Urologic: negative for hematuria Abdominal: negative for nausea, vomiting, diarrhea, bright red blood per rectum, melena, or hematemesis Neurologic: negative for visual changes, syncope, or dizziness All other systems reviewed and are otherwise negative except as noted above.    Blood pressure (!) 148/86, pulse (!) 51, height 6\' 3"  (1.905 m), weight 266 lb 6.4 oz (120.8 kg).  General  appearance: alert, cooperative, no distress and mildly obese Neck: no JVD and LCE scar, RCA bruit Lungs: clear to auscultation bilaterally Heart: regular rate and rhythm and short systolic murmur LSB Extremities: extremities normal, atraumatic, no cyanosis or edema Skin: Skin color, texture, turgor normal. No rashes or lesions Neurologic: Grossly normal  EKG NSR, SB-52, QTc 442  ASSESSMENT AND PLAN:   CAD, s/p multiple PCIs s/p PCI to LAD and RCA in 2004. The RCA is known to be occluded. He has had multiple interventions to the OM/ CFX- June 2012, Sept 2012,  and April 2013. Basically a CFX full metal jacket. Myoview: January 2014: Complete resolution of ischemia previously noted, normal ejection fraction, no infarction.  PAF (paroxysmal atrial fibrillation) (HCC)  on Amiodarone maintaining NSR. LFTS and TSH WNL May 2018 (Dr Forde Dandy). He is bradycardic but asymptomatic.   Essential hypertension Controlled  Long term current use of anticoagulant therapy The patients CHA2 VASc=3, now on Eliquis and Plavix.   Left carotid artery stenosis S/P LCE- Dr Trula Slade. CA dopplers in NOv- patent LCE site, 1-39% RICA  Chronic renal insufficiency, stage III (moderate) CRI-stage 3- SCr was 1.8 in May 2018 (Dr Forde Dandy follows)  Dyslipidemia, goal LDL below 70 LDL was 76 May 2018 (Dr Forde Dandy)  PLAN  Same cardiac Rx. I told him if he developed any exertional fatigue that was new I would stop his Toprol. I reviewed labs from Dr [facility] office. Pt can f/u with Dr Ellyn Hack in Aug.   Kerin Ransom PA-C 01/11/2018 10:59 AM

## 2018-01-11 NOTE — Patient Instructions (Signed)
Charles Robinson, [place] wants you to follow-up in: AUGUST 2019 with Dr. Ellyn Hack.  You will receive a reminder notification in MyChart two months in advance.  If you don't receive a letter, please call our office to schedule the follow-up appointment.

## 2018-01-27 ENCOUNTER — Ambulatory Visit: Payer: Medicare Other | Admitting: Cardiology

## 2018-02-05 ENCOUNTER — Other Ambulatory Visit: Payer: Self-pay | Admitting: Cardiology

## 2018-02-07 NOTE — Telephone Encounter (Signed)
REFILL 

## 2018-02-14 ENCOUNTER — Other Ambulatory Visit: Payer: Self-pay

## 2018-02-14 MED ORDER — METOPROLOL SUCCINATE ER 25 MG PO TB24: 25.0000 mg | ORAL_TABLET | Freq: Every day | ORAL | 3 refills | Status: DC

## 2018-02-23 ENCOUNTER — Other Ambulatory Visit: Payer: Self-pay | Admitting: *Deleted

## 2018-02-23 MED ORDER — METOPROLOL SUCCINATE ER 25 MG PO TB24: 25.0000 mg | ORAL_TABLET | Freq: Every day | ORAL | 1 refills | Status: DC

## 2018-03-26 ENCOUNTER — Other Ambulatory Visit: Payer: Self-pay | Admitting: Cardiology

## 2018-04-14 ENCOUNTER — Other Ambulatory Visit: Payer: Self-pay | Admitting: Cardiology

## 2018-04-22 ENCOUNTER — Telehealth: Payer: Self-pay | Admitting: Cardiology

## 2018-04-22 NOTE — Telephone Encounter (Signed)
Received records from [facility] , [place]. On 04/22/18, Appt 06/15/18 @ 8:20AM. [place]

## 2018-06-08 ENCOUNTER — Ambulatory Visit: Payer: Medicare Other | Admitting: Cardiology

## 2018-06-15 ENCOUNTER — Encounter: Payer: Self-pay | Admitting: Cardiology

## 2018-06-15 ENCOUNTER — Ambulatory Visit (INDEPENDENT_AMBULATORY_CARE_PROVIDER_SITE_OTHER): Payer: Medicare Other | Admitting: Cardiology

## 2018-06-15 VITALS — BP 148/84 | HR 52 | Ht 75.0 in | Wt 263.8 lb

## 2018-06-15 DIAGNOSIS — I480 Paroxysmal atrial fibrillation: Secondary | ICD-10-CM | POA: Diagnosis not present

## 2018-06-15 DIAGNOSIS — I2510 Atherosclerotic heart disease of native coronary artery without angina pectoris: Secondary | ICD-10-CM | POA: Diagnosis not present

## 2018-06-15 DIAGNOSIS — Z9861 Coronary angioplasty status: Secondary | ICD-10-CM

## 2018-06-15 DIAGNOSIS — Z7901 Long term (current) use of anticoagulants: Secondary | ICD-10-CM

## 2018-06-15 DIAGNOSIS — I10 Essential (primary) hypertension: Secondary | ICD-10-CM

## 2018-06-15 DIAGNOSIS — E785 Hyperlipidemia, unspecified: Secondary | ICD-10-CM

## 2018-06-15 DIAGNOSIS — E669 Obesity, unspecified: Secondary | ICD-10-CM

## 2018-06-15 DIAGNOSIS — Z79899 Other long term (current) drug therapy: Secondary | ICD-10-CM

## 2018-06-15 MED ORDER — NITROGLYCERIN 0.4 MG SL SUBL: SUBLINGUAL_TABLET | SUBLINGUAL | 4 refills | Status: DC

## 2018-06-15 NOTE — Progress Notes (Signed)
PCP: [name], MD  Clinic Note: Chief Complaint  Patient presents with  . Follow-up    NO complaints  . Coronary Artery Disease  . Atrial Fibrillation    HPI: Charles Robinson is a 73 y.o. male with a PMH below who presents today for 6 month follow-up for CAD (extensive PCI - on lifelong Plavix), A. Fib (noted back in 2011 - now on Amiodarone, Eliquis).  He also has Carotid artery disease (s/p CEA) follwed by Dr. Gustavus Bryant is a former patient of [name] who I first met in the Cath lab in Sept 2012 - -he has a long h/o CAD:  2004 (Dr. Albertine Patricia) - PCI LAD & RCA  03/2011: routine Myoview -- Inferior Ischemia --> (Dr. Rex Kras) Cath: small RCA 100% CTO @ stent (L-R collaterals). LAD- prox stent patent, D1 ost 50%. OM1 subtotal TO just proximal to bifurcation--> bifurcation PCI/PTCA - 2.25 x 23 (2.5 mm) MinVision BMS into inferior branch.  Recurrent Angina - Abnormal Nuc ST (07/2011)-- PCI of OM1 ISR /thrombosis (recent OM1 PCI by Dr. Rex Kras with  complicated by Guideliner related dissection involving a large portion of the Cx & OM1 -- essentially "full metal jacket" in Cx from prox to distal bifurcation & down the OM1 up to the previous stent.    Hospitalization complicated by Type 4a MI & Afib RVR -- DCCV.  03/15/2012: Abnormal Nuc ST again for CP --> PTCA of OM1 ostium & overlapping DES in OM (uncovered site) with BMS ISR PTCA & mCX PTCA.  I last saw him in Aug 2018 --he was doing very well with no major complaints.  Continues to workout at the [facility] [employer] doing yoga, treadmill and exercise classes.  Also still goes fishing.  No angina or CHF symptoms.  No suggestion of A. fib.  No bleeding issues.  [name] was last seen on Jan 11, 2018  He did not notice any recurrent symptoms of A. fib.  Had a symptomatic bradycardia and beta-blocker and took back.  Was on Eliquis and Plavix without any bleeding.  No fatigue or dyspnea on exertion.  No chest pain or palpitations.  Recent  Hospitalizations: none  Studies Personally Reviewed - (if available, images/films reviewed: From Epic Chart or Care Everywhere)  none   Interval History: Charles Robinson returns today again doing very well.  He is a little bit confused that his blood pressures.  Tells me that at home is 130/70 routinely and his PCPs check was 120/86. He continues to be very active with his [employer] workouts.  He does exercise classes as well as walking in the gym.  He also does walking at home as well.  He does additional gym exercises on days without classes.  He says that he has not had any symptoms whatsoever to suspect recurrent angina with either rest or exertion.  No sensation of rapid irregular heartbeats or palpitations.  He does have occasional heartburn and has had microscopic hematuria on evaluation by his PCP, but no frank hematuria or other bleeding issues on his Plavix plus Eliquis.  No melena, hematochezia or hematuria.  Based on his last lipid evaluation his LDL was up to 79 and so he was put back on 40 mg of Crestor along with Zetia.  He denies any myalgias or arthralgias.  No PND, orthopnea or edema. No palpitations, lightheadedness, dizziness, weakness or syncope/near syncope. No TIA/amaurosis fugax symptoms.  No claudication.  ROS: A comprehensive was performed. Review of Systems  Constitutional: Negative for fever, malaise/fatigue and weight loss.  HENT: Negative for congestion.   Respiratory: Negative for cough.   Gastrointestinal: Negative for abdominal pain, constipation and heartburn.  Musculoskeletal: Negative for falls, joint pain and myalgias.  Neurological: Negative for dizziness, focal weakness and weakness.  Psychiatric/Behavioral: Negative.    I have reviewed and (if needed) personally updated the patient's problem list, medications, allergies, past medical and surgical history, social and family history.   Past Medical History:  Diagnosis Date  . Arthritis    back  . BPH (benign  prostatic hypertrophy)   . Bruit 08/01/2009   doppler - no suggestion of significant diameter reduction, dissection or aneurysmal dilation.  normal evaluation  . CAD S/P multivessel PCI: LAD, RCA and extensive LCX-OM1 03/15/2012   s/p PCI to LAD, to RCA (now occluded), then extensive PCI to Dominant LCx-OM1  (enitre prox-AVG Circ for dissection in 07/2011)  . Cancer ([place])    SKIN CA  . GERD (gastroesophageal reflux disease)   . History of colon polyps   . History of echocardiogram 08/16/2011   Echo - EF 60-65%; moderate LV concentric hypertrophy; abdnormal LV relaxation (grade 1 diastolic dysfunction; ascending aorta mildly dilated; LA moderately dilated;   . Hyperlipidemia   . Hypertension   . PAF (paroxysmal atrial fibrillation) ([place]) 12/20/2012   followed by Dr. Glenetta Hew; s/p DCCV 2011, 07/2011 post PCI with Type 4a MI; CHA2DS2Vasc = 3, on Warfarin  . Paroxysmal atrial flutter ([place]) 08/20/2011   TEE- atrial septum - no defect identified; RA normal in size, no evidence of thrombus; ascending aorta normal  . Rupture quadriceps tendon     Past Surgical History:  Procedure Laterality Date  . BACK SURGERY    . COLONOSCOPY    . CORONARY ANGIOPLASTY WITH STENT PLACEMENT  03/15/2012   multiple stens in AVGroove Circ & OM1;; 2 site PTCA w/ PTCA and stenting of OM2 ang PTCA of distal stent ISR followed by PTCA of mid LCx ISR  . CORONARY STENT PLACEMENT  2003 - 2011 - 2012 - 2013   Pre-2012 - BMS in RCA now known occluded, BMS in proximal LAD; 07/2011 - 2.25 x 23 BMS in OM 1 with significant disease on either side noted shortly after -->  2 additional overlapping DES 2.5 mm x 30 mm and 2.25 mm x 26 mm in OM1,; 3 overlapping Resolute DES in AV groove Cx crossing OM1: (Tapered from 3.8-2.6 mm)- Resolute DES 3.5 x 22, 3.0 x 38, 2.5 x 14  . DOPPLER ECHOCARDIOGRAPHY  07/2011   EF ~60-65%, Grade 1 D Dysfxn; mod Conc LVH  . ENDARTERECTOMY Left 02/27/2016   Procedure: LEFT  CAROTID ARTERY ENDARTERECTOMY  ;  Surgeon: Serafina Mitchell, MD;  Location: [facility];  Service: Vascular;  Laterality: Left;  . LEFT HEART CATHETERIZATION WITH CORONARY ANGIOGRAM N/A 03/15/2012   Procedure: LEFT HEART CATHETERIZATION WITH CORONARY ANGIOGRAM;  Surgeon: [name], MD;  Location: [facility] CATH LAB: patent LAD stents, patent LCx stents w/80% focal ISR just distal to OM; OM proximal stent open; -- 2 site PTCA-PCI  . LUMBAR LAMINECTOMY/DECOMPRESSION MICRODISCECTOMY Bilateral 12/11/2014   Procedure: Bilateral Lumbar Three-Four Laminectomy;  Surgeon: Kristeen Miss, MD;  Location: [facility];  Service: Neurosurgery;  Laterality: Bilateral;  bilateral  . NM MYOVIEW LTD  11/2012   No ischemia or infarction, normal EF & WM  . PATCH ANGIOPLASTY Left 02/27/2016   Procedure: WITH HEMASHIELD DACRON  PATCH ANGIOPLASTY;  Surgeon: Serafina Mitchell, MD;  Location: [facility];  Service: Vascular;  Laterality: Left;  [name] QUADRICEPS TENDON REPAIR  09/19/2012   Procedure: REPAIR QUADRICEP TENDON;  Surgeon: [name], MD;  Location: [facility];  Service: Orthopedics;  Laterality: Left;  [name] VASECTOMY      Current Meds  Medication Sig  . amiodarone (PACERONE) 200 MG tablet TAKE 1 TABLET EVERY DAY  . clopidogrel (PLAVIX) 75 MG tablet TAKE 1 TABLET BY MOUTH EVERY DAY  . Coenzyme Q10 200 MG capsule Take 200 mg by mouth every morning.   [name] doxazosin (CARDURA) 8 MG tablet Take 4 mg by mouth every evening.   [name] ELIQUIS 5 MG TABS tablet TAKE 1 TABLET BY MOUTH TWICE A DAY  . ezetimibe (ZETIA) 10 MG tablet TAKE 1 TABLET BY MOUTH EVERY DAY  . finasteride (PROSCAR) 5 MG tablet Take 5 mg by mouth every evening.   [name] lisinopril (PRINIVIL,ZESTRIL) 10 MG tablet TAKE 1 TABLET BY MOUTH EVERY DAY  . lisinopril (PRINIVIL,ZESTRIL) 10 MG tablet Take 10 mg by mouth daily.  . metoprolol succinate (TOPROL XL) 25 MG 24 hr tablet Take 1 tablet (25 mg total) by mouth at bedtime.  . Multiple Vitamin (MULTIVITAMIN WITH MINERALS) TABS tablet Take 1 tablet by mouth every morning.    . nitroGLYCERIN (NITROSTAT) 0.4 MG SL tablet PLACE 1 TABLET (0.4 MG TOTAL) UNDER THE TONGUE EVERY 5 (FIVE) MINUTES AS NEEDED. FOR CHEST PAIN  . Omega-3 Fatty Acids (FISH OIL) 1000 MG CAPS Take 1,000 capsules by mouth 2 (two) times daily.   . rosuvastatin (CRESTOR) 40 MG tablet TAKE 1 TABLET BY MOUTH DAILY  . [DISCONTINUED] nitroGLYCERIN (NITROSTAT) 0.4 MG SL tablet PLACE 1 TABLET (0.4 MG TOTAL) UNDER THE TONGUE EVERY 5 (FIVE) MINUTES AS NEEDED. FOR CHEST PAIN    Allergies  Allergen Reactions  . Lipitor [Atorvastatin] Other (See Comments)    Increased HR is currently tolerating --   . Morphine And Related Nausea And Vomiting  . Vicodin [Hydrocodone-Acetaminophen] Other (See Comments)    Feel crazy    Social History   Tobacco Use  . Smoking status: Never Smoker  . Smokeless tobacco: Never Used  Substance Use Topics  . Alcohol use: Yes    Alcohol/week: 1.2 oz    Types: 2 Cans of beer per week    Comment: rare  . Drug use: No   Social History   Social History Narrative   Charles Robinson is a father of 2, grandfather of 22.  His daughter was classmates with Dr. Ellyn Hack in college.   He and his wife are currently in the process of moving houses, further down the street in order to downsize to a single-story house.  He has been working out at [employer].         family history includes Heart disease in his father.  He really does not know the details.    Wt Readings from Last 3 Encounters:  06/15/18 263 lb 12.8 oz (119.7 kg)  01/11/18 266 lb 6.4 oz (120.8 kg)  10/18/17 267 lb (121.1 kg)    PHYSICAL EXAM BP (!) 148/84   Pulse (!) 52   Ht '6\' 3"'$  (1.905 m)   Wt 263 lb 12.8 oz (119.7 kg)   SpO2 98%   BMI 32.97 kg/m  Physical Exam  Constitutional: He is oriented to person, place, and time. He appears well-developed. No distress.  Healthy-appearing.  Well-groomed.  Well-nourished.  HENT:  Head: Normocephalic and atraumatic.  Mouth/Throat: No oropharyngeal exudate.  Neck: Normal  range  of motion. Neck supple. No hepatojugular reflux and no JVD present. Carotid bruit is not present.  Well healed CEA scar  Cardiovascular: Regular rhythm, normal heart sounds and intact distal pulses. Bradycardia present. PMI is not displaced. Exam reveals no gallop and no friction rub.  No murmur heard. Pulmonary/Chest: Effort normal and breath sounds normal. No respiratory distress. He has no wheezes. He has no rales.  Abdominal: Soft. Bowel sounds are normal. He exhibits no distension. There is no tenderness. There is no rebound.  No HSM  Musculoskeletal: Normal range of motion. He exhibits no edema.  Neurological: He is alert and oriented to person, place, and time.  Psychiatric: He has a normal mood and affect. His behavior is normal. Judgment and thought content normal.  Vitals reviewed.   Adult ECG Report Not checked.  Other studies Reviewed: Additional studies/ records that were reviewed today include:  Recent Labs: Apr 14, 2018: TC 132, TG 78, LDL 79, HDL 37.  CBC: W4.74.  Hgb 14.6.  Platelet 155.   TSH 2.36.    BUN/CR 26/1.5.  Na+ 140, K+ 4.7. Normal LFTs.   ASSESSMENT / PLAN: Problem List Items Addressed This Visit    PAF (paroxysmal atrial fibrillation) ([place]) : CHA2DS2-VASc Score - converting to DOAC (Chronic)    Still maintaining sinus rhythm sinus bradycardia on amiodarone with Toprol.  Converted from warfarin to Eliquis recently with no bleeding issues.  As discussed, it would be okay to hold Plavix for bruises.  Would be okay to hold both Plavix and Eliquis if necessary for procedures.       Relevant Medications   lisinopril (PRINIVIL,ZESTRIL) 10 MG tablet   nitroGLYCERIN (NITROSTAT) 0.4 MG SL tablet   On amiodarone therapy (Chronic)    May need to consider rechecking PFTs when the next year. Otherwise LFTs and TSH being followed by PCP. Annual eye exams are being performed.      Relevant Orders   TSH   Obesity (BMI 30-39.9) (Chronic)    He is exercising,  -- it is probably more of the dietary devices of the problem.We discussed adjustment.      Long term current use of anticoagulant therapy (Chronic)   Essential hypertension (Chronic)    Blood pressure looks little bit high today, but has been well-controlled normally and  was well controlled at PCP visit. Continue lisinopril and metoprolol at current dose.    Monitor for bradycardia.      Relevant Medications   lisinopril (PRINIVIL,ZESTRIL) 10 MG tablet   nitroGLYCERIN (NITROSTAT) 0.4 MG SL tablet   Dyslipidemia, goal LDL below 70 (Chronic)    Labs in May showed LDL up to 79.  Not quite at goal.  We will recheck in November, if not at goal, may need to consider PCSK9 inhibitor.      Relevant Medications   lisinopril (PRINIVIL,ZESTRIL) 10 MG tablet   nitroGLYCERIN (NITROSTAT) 0.4 MG SL tablet   Other Relevant Orders   Lipid panel   Hepatic function panel   CAD, s/p multiple PCIs - Primary (Chronic)    Extensive CAD history with known occlusion of the RCA, LAD stent widely patent and extensive stents in the circumflex-OM1. Follow-up Myoview in 2014 was negative for ischemia.  He has not had any anginal symptoms.    Plan:   Continue with Plavix lifelong based on significant stents. Would be okay to hold for procedures.  On low-dose Toprol (without any signs of cardiac decompensation would continue for cardiac benefit).  Stable  dose of ACE inhibitor.  On Crestor plus Zetia      Relevant Medications   lisinopril (PRINIVIL,ZESTRIL) 10 MG tablet   nitroGLYCERIN (NITROSTAT) 0.4 MG SL tablet   Other Relevant Orders   Lipid panel   Hepatic function panel       I spent a total of 25 minutes with the patient and chart review. >  50% of the time was spent in direct patient consultation.   Current medicines are reviewed at length with the patient today.  (+/- concerns) ? Lipid control meds The following changes have been made:  see below  Patient Instructions  Medication  Instructions:   NO CHANGE  Labwork:  Your physician recommends that you return for lab work in: November=PRIOR TO EATING  Follow-Up:  Your physician wants you to follow-up in: Kerin Ransom PA You will receive a reminder letter in the mail two months in advance. If you don't receive a letter, please call our office to schedule the follow-up appointment.   Your physician wants you to follow-up in: [place] will receive a reminder letter in the mail two months in advance. If you don't receive a letter, please call our office to schedule the follow-up appointment.   If you need a refill on your cardiac medications before your next appointment, please call your pharmacy.       Studies Ordered:   Orders Placed This Encounter  Procedures  . Lipid panel  . Hepatic function panel  . TSH      Glenetta Hew, M.D., M.S. Interventional Cardiologist   Pager # [phone] Phone # [phone] [address]. [place], [place] [address]   Thank you for choosing [facility] at [facility]!!

## 2018-06-15 NOTE — Patient Instructions (Signed)
Medication Instructions:   NO CHANGE  Labwork:  Your physician recommends that you return for lab work in: November=PRIOR TO EATING  Follow-Up:  Your physician wants you to follow-up in: Kerin Ransom PA You will receive a reminder letter in the mail two months in advance. If you don't receive a letter, please call our office to schedule the follow-up appointment.   Your physician wants you to follow-up in: [place] will receive a reminder letter in the mail two months in advance. If you don't receive a letter, please call our office to schedule the follow-up appointment.   If you need a refill on your cardiac medications before your next appointment, please call your pharmacy.

## 2018-06-17 ENCOUNTER — Encounter: Payer: Self-pay | Admitting: Cardiology

## 2018-06-17 NOTE — Assessment & Plan Note (Signed)
Extensive CAD history with known occlusion of the RCA, LAD stent widely patent and extensive stents in the circumflex-OM1. Follow-up Myoview in 2014 was negative for ischemia.  He has not had any anginal symptoms.    Plan:   Continue with Plavix lifelong based on significant stents. Would be okay to hold for procedures.  On low-dose Toprol (without any signs of cardiac decompensation would continue for cardiac benefit).  Stable dose of ACE inhibitor.  On Crestor plus Zetia

## 2018-06-17 NOTE — Assessment & Plan Note (Signed)
He is exercising, -- it is probably more of the dietary devices of the problem.We discussed adjustment.

## 2018-06-17 NOTE — Assessment & Plan Note (Signed)
May need to consider rechecking PFTs when the next year. Otherwise LFTs and TSH being followed by PCP. Annual eye exams are being performed.

## 2018-06-17 NOTE — Assessment & Plan Note (Signed)
Blood pressure looks little bit high today, but has been well-controlled normally and  was well controlled at PCP visit. Continue lisinopril and metoprolol at current dose.    Monitor for bradycardia.

## 2018-06-17 NOTE — Assessment & Plan Note (Signed)
Labs in May showed LDL up to 79.  Not quite at goal.  We will recheck in November, if not at goal, may need to consider PCSK9 inhibitor.

## 2018-06-17 NOTE — Assessment & Plan Note (Signed)
Still maintaining sinus rhythm sinus bradycardia on amiodarone with Toprol.  Converted from warfarin to Eliquis recently with no bleeding issues.  As discussed, it would be okay to hold Plavix for bruises.  Would be okay to hold both Plavix and Eliquis if necessary for procedures.

## 2018-07-15 ENCOUNTER — Other Ambulatory Visit: Payer: Self-pay | Admitting: Cardiology

## 2018-08-19 ENCOUNTER — Other Ambulatory Visit: Payer: Self-pay | Admitting: Cardiology

## 2018-09-27 ENCOUNTER — Other Ambulatory Visit: Payer: Self-pay | Admitting: Cardiology

## 2018-10-06 LAB — HEPATIC FUNCTION PANEL
ALT: 22 IU/L (ref 0–44)
AST: 30 IU/L (ref 0–40)
Albumin: 4.3 g/dL (ref 3.5–4.8)
Alkaline Phosphatase: 69 IU/L (ref 39–117)
Bilirubin Total: 1.5 mg/dL — ABNORMAL HIGH (ref 0.0–1.2)
Bilirubin, Direct: 0.31 mg/dL (ref 0.00–0.40)
Total Protein: 6.4 g/dL (ref 6.0–8.5)

## 2018-10-06 LAB — TSH: TSH: 2.99 u[IU]/mL (ref 0.450–4.500)

## 2018-10-06 LAB — LIPID PANEL
Chol/HDL Ratio: 2.9 ratio (ref 0.0–5.0)
Cholesterol, Total: 139 mg/dL (ref 100–199)
HDL: 48 mg/dL (ref >39)
LDL Calculated: 75 mg/dL (ref 0–99)
Triglycerides: 80 mg/dL (ref 0–149)
VLDL Cholesterol Cal: 16 mg/dL (ref 5–40)

## 2018-10-09 ENCOUNTER — Other Ambulatory Visit: Payer: Self-pay | Admitting: Cardiology

## 2018-10-11 ENCOUNTER — Other Ambulatory Visit: Payer: Self-pay | Admitting: Urology

## 2018-10-11 DIAGNOSIS — R972 Elevated prostate specific antigen [PSA]: Secondary | ICD-10-CM

## 2018-10-12 NOTE — Telephone Encounter (Signed)
Rx request sent to pharmacy.  

## 2018-10-21 ENCOUNTER — Other Ambulatory Visit: Payer: Self-pay | Admitting: Cardiology

## 2018-10-24 ENCOUNTER — Other Ambulatory Visit: Payer: Self-pay | Admitting: Cardiology

## 2018-10-24 NOTE — Telephone Encounter (Signed)
Rx(s) sent to pharmacy electronically.  

## 2018-10-26 ENCOUNTER — Ambulatory Visit
Admission: RE | Admit: 2018-10-26 | Discharge: 2018-10-26 | Disposition: A | Discharge status: Home / Self Care | Payer: Medicare Other | Source: Ambulatory Visit | Attending: Urology | Admitting: Urology

## 2018-10-26 DIAGNOSIS — R972 Elevated prostate specific antigen [PSA]: Secondary | ICD-10-CM

## 2018-10-26 MED ORDER — GADOBENATE DIMEGLUMINE 529 MG/ML IV SOLN
10.0000 mL | Freq: Once | INTRAVENOUS | Status: AC | PRN
  Administered 2018-10-26: 10 mL via INTRAVENOUS

## 2018-10-31 ENCOUNTER — Encounter (HOSPITAL_COMMUNITY): Payer: Medicare Other

## 2018-10-31 ENCOUNTER — Ambulatory Visit (HOSPITAL_COMMUNITY)
Admission: RE | Admit: 2018-10-31 | Discharge: 2018-10-31 | Disposition: A | Discharge status: Home / Self Care | Payer: Medicare Other | Source: Ambulatory Visit | Attending: Surgery | Admitting: Surgery

## 2018-10-31 ENCOUNTER — Other Ambulatory Visit: Payer: Self-pay

## 2018-10-31 ENCOUNTER — Ambulatory Visit: Payer: Medicare Other | Admitting: Surgery

## 2018-10-31 ENCOUNTER — Encounter: Payer: Self-pay | Admitting: Surgery

## 2018-10-31 ENCOUNTER — Ambulatory Visit (INDEPENDENT_AMBULATORY_CARE_PROVIDER_SITE_OTHER): Payer: Medicare Other | Admitting: Surgery

## 2018-10-31 VITALS — BP 129/73 | HR 56 | Temp 97.2°F | Resp 18 | Ht 75.0 in | Wt 265.0 lb

## 2018-10-31 DIAGNOSIS — I6522 Occlusion and stenosis of left carotid artery: Secondary | ICD-10-CM | POA: Diagnosis present

## 2018-10-31 NOTE — Progress Notes (Signed)
Vascular and Vein Specialist of [facility]  Patient name: Charles Robinson MRN: [id number] DOB: 1946/02/11 Sex: male   REASON FOR VISIT:    Follow up  [place] ILLNESS:    Charles Robinson is a 72 y.o. male who returns today for follow-up.  He is status post left carotid endarterectomy on 02/27/2016.  This was done for asymptomatic stenosis.  The lesion and plaque extended up to the skull base.  There is also a significant inflammatory reaction around the internal carotid artery.  I could not place a shunt because of how distal the lesion was.  He tolerated his operation without any significant difficulties  He is back today for follow-up.  He denies any neurologic issues.  He has not had any episodes of numbness or weakness or slurred speech.  He states that he has some swallowing difficulties however these have been persistent.   PAST MEDICAL HISTORY:   Past Medical History:  Diagnosis Date  . Arthritis    back  . BPH (benign prostatic hypertrophy)   . Bruit 08/01/2009   doppler - no suggestion of significant diameter reduction, dissection or aneurysmal dilation.  normal evaluation  . CAD S/P multivessel PCI: LAD, RCA and extensive LCX-OM1 03/15/2012   s/p PCI to LAD, to RCA (now occluded), then extensive PCI to Dominant LCx-OM1  (enitre prox-AVG Circ for dissection in 07/2011)  . Cancer ([place])    SKIN CA  . GERD (gastroesophageal reflux disease)   . History of colon polyps   . History of echocardiogram 08/16/2011   Echo - EF 60-65%; moderate LV concentric hypertrophy; abdnormal LV relaxation (grade 1 diastolic dysfunction; ascending aorta mildly dilated; LA moderately dilated;   . Hyperlipidemia   . Hypertension   . PAF (paroxysmal atrial fibrillation) ([place]) 12/20/2012   followed by Dr. Glenetta Hew; s/p DCCV 2011, 07/2011 post PCI with Type 4a MI; CHA2DS2Vasc = 3, on Warfarin  . Paroxysmal atrial flutter ([place]) 08/20/2011   TEE- atrial  septum - no defect identified; RA normal in size, no evidence of thrombus; ascending aorta normal  . Rupture quadriceps tendon      FAMILY HISTORY:   Family History  Problem Relation Age of Onset  . Heart disease Father     SOCIAL HISTORY:   Social History   Tobacco Use  . Smoking status: Never Smoker  . Smokeless tobacco: Never Used  Substance Use Topics  . Alcohol use: Yes    Alcohol/week: 2.0 standard drinks    Types: 2 Cans of beer per week    Comment: rare     ALLERGIES:   Allergies  Allergen Reactions  . Lipitor [Atorvastatin] Other (See Comments)    Increased HR is currently tolerating --   . Morphine And Related Nausea And Vomiting  . Vicodin [Hydrocodone-Acetaminophen] Other (See Comments)    Feel crazy     CURRENT MEDICATIONS:   Current Outpatient Medications  Medication Sig Dispense Refill  . amiodarone (PACERONE) 200 MG tablet TAKE 1 TABLET BY MOUTH EVERY DAY 90 tablet 2  . clopidogrel (PLAVIX) 75 MG tablet TAKE 1 TABLET BY MOUTH EVERY DAY 90 tablet 1  . Coenzyme Q10 200 MG capsule Take 200 mg by mouth every morning.     [name] doxazosin (CARDURA) 8 MG tablet Take 4 mg by mouth every evening.     [name] ELIQUIS 5 MG TABS tablet TAKE 1 TABLET BY MOUTH TWICE A DAY 180 tablet 1  . ezetimibe (ZETIA) 10 MG tablet  TAKE 1 TABLET BY MOUTH EVERY DAY 90 tablet 1  . finasteride (PROSCAR) 5 MG tablet Take 5 mg by mouth every evening.     [name] lisinopril (PRINIVIL,ZESTRIL) 10 MG tablet TAKE 1 TABLET BY MOUTH EVERY DAY 90 tablet 2  . metoprolol succinate (TOPROL-XL) 25 MG 24 hr tablet TAKE 1 TABLET BY MOUTH EVERYDAY AT BEDTIME 90 tablet 2  . Multiple Vitamin (MULTIVITAMIN WITH MINERALS) TABS tablet Take 1 tablet by mouth every morning.     . nitroGLYCERIN (NITROSTAT) 0.4 MG SL tablet PLACE 1 TABLET (0.4 MG TOTAL) UNDER THE TONGUE EVERY 5 (FIVE) MINUTES AS NEEDED. FOR CHEST PAIN 25 tablet 4  . Omega-3 Fatty Acids (FISH OIL) 1000 MG CAPS Take 1,000 capsules by mouth 2 (two) times  daily.     . rosuvastatin (CRESTOR) 40 MG tablet TAKE 1 TABLET BY MOUTH DAILY 90 tablet 1   No current facility-administered medications for this visit.     REVIEW OF SYSTEMS:   [X]  denotes positive finding, [ ]  denotes negative finding Cardiac  Comments:  Chest pain or chest pressure:    Shortness of breath upon exertion:    Short of breath when lying flat:    Irregular heart rhythm:        Vascular    Pain in calf, thigh, or hip brought on by ambulation:    Pain in feet at night that wakes you up from your sleep:     Blood clot in your veins:    Leg swelling:         Pulmonary    Oxygen at home:    Productive cough:     Wheezing:         Neurologic    Sudden weakness in arms or legs:     Sudden numbness in arms or legs:     Sudden onset of difficulty speaking or slurred speech:    Temporary loss of vision in one eye:     Problems with dizziness:         Gastrointestinal    Blood in stool:     Vomited blood:         Genitourinary    Burning when urinating:     Blood in urine:        Psychiatric    Major depression:         Hematologic    Bleeding problems:    Problems with blood clotting too easily:        Skin    Rashes or ulcers:        Constitutional    Fever or chills:      PHYSICAL EXAM:   Vitals:   10/31/18 1359 10/31/18 1403  BP: 132/72 129/73  Pulse: (!) 56 (!) 56  Resp: 18   Temp: (!) 97.2 F (36.2 C)   TempSrc: Oral   SpO2: 98%   Weight: 265 lb (120.2 kg)   Height: 6\' 3"  (1.905 m)     GENERAL: The patient is a well-nourished male, in no acute distress. The vital signs are documented above. CARDIAC: There is a regular rate and rhythm.  VASCULAR: No carotid bruits PULMONARY: Non-labored respirations  MUSCULOSKELETAL: There are no major deformities or cyanosis. NEUROLOGIC: No focal weakness or paresthesias are detected. SKIN: There are no ulcers or rashes noted. PSYCHIATRIC: The patient has a normal affect.  STUDIES:   I have  reviewed the vascular studies with the following findings: Right Carotid: Velocities in the right ICA  are consistent with a 1-39% stenosis.  Left Carotid: Evidence consistent with a new finding of a total occlusion of the       left ICA.  Vertebrals: Bilateral vertebral arteries demonstrate antegrade flow. Subclavians: Normal flow hemodynamics were seen in bilateral subclavian       arteries. MEDICAL ISSUES:   Silent occlusion of left carotid artery: The patient remains asymptomatic.  The right side is widely patent.  I discussed the ultrasound findings today with the patient.  No intervention is recommended.  I will have him follow-up in 1 year with a repeat ultrasound.    Annamarie Major, MD [facility] and Vein Specialists of [facility] [phone] Pager [phone]

## 2018-12-29 ENCOUNTER — Other Ambulatory Visit: Payer: Self-pay | Admitting: Neurological Surgery

## 2018-12-29 DIAGNOSIS — M47816 Spondylosis without myelopathy or radiculopathy, lumbar region: Secondary | ICD-10-CM

## 2019-01-11 ENCOUNTER — Ambulatory Visit
Admission: RE | Admit: 2019-01-11 | Discharge: 2019-01-11 | Disposition: A | Discharge status: Home / Self Care | Payer: Medicare Other | Source: Ambulatory Visit | Attending: Neurological Surgery | Admitting: Neurological Surgery

## 2019-01-11 DIAGNOSIS — M47816 Spondylosis without myelopathy or radiculopathy, lumbar region: Secondary | ICD-10-CM

## 2019-01-11 MED ORDER — GADOBENATE DIMEGLUMINE 529 MG/ML IV SOLN
10.0000 mL | Freq: Once | INTRAVENOUS | Status: AC | PRN
  Administered 2019-01-11: 10 mL via INTRAVENOUS

## 2019-02-15 ENCOUNTER — Other Ambulatory Visit: Payer: Self-pay | Admitting: Cardiology

## 2019-03-09 ENCOUNTER — Other Ambulatory Visit: Payer: Self-pay | Admitting: Cardiology

## 2019-03-10 NOTE — Telephone Encounter (Signed)
Eliquis 5mg  refilled.

## 2019-03-27 ENCOUNTER — Other Ambulatory Visit: Payer: Self-pay

## 2019-03-27 MED ORDER — EZETIMIBE 10 MG PO TABS: 10.0000 mg | ORAL_TABLET | Freq: Every day | ORAL | 1 refills | Status: DC

## 2019-04-03 ENCOUNTER — Other Ambulatory Visit: Payer: Self-pay | Admitting: Cardiology

## 2019-04-03 MED ORDER — CLOPIDOGREL BISULFATE 75 MG PO TABS: 75.0000 mg | ORAL_TABLET | Freq: Every day | ORAL | 1 refills | Status: DC

## 2019-04-03 NOTE — Telephone Encounter (Signed)
° ° ° °*  STAT* If patient is at the pharmacy, call can be transferred to refill team.   1. Which medications need to be refilled? (please list name of each medication and dose if known) clopidogrel (PLAVIX) 75 MG tablet  2. Which pharmacy/location (including street and city if local pharmacy) is medication to be sent to? [id number] - [place], [place] - [address] AT [place]  3. Do they need a 30 day or 90 day supply? [place]

## 2019-04-06 ENCOUNTER — Telehealth: Payer: Self-pay | Admitting: Cardiology

## 2019-04-06 NOTE — Telephone Encounter (Signed)
error 

## 2019-04-23 ENCOUNTER — Other Ambulatory Visit: Payer: Self-pay | Admitting: Cardiology

## 2019-06-07 NOTE — Progress Notes (Signed)
Recent Labs: 05/09/2019 Na+ 139, K+ 4.2, Cl- 104, HCO3- 28, BUN 21, Cr 1.6, Glu 89, Ca2+ 9.0; AST 20, ALT 23, AlkP 62 CBC: W 4.3, H/H 15/47.8, Plt 145; TSH 2.92 TC 137, TG 85, HDL 40, LDL 80  Reviewed - will discuss in f/u visit --> LDL still not more wanted to be.  Need to discuss treatment options, may consider trying pravastatin if not already tried.  Glenetta Hew, MD

## 2019-06-21 ENCOUNTER — Other Ambulatory Visit: Payer: Self-pay | Admitting: Internal Medicine

## 2019-06-21 DIAGNOSIS — N6320 Unspecified lump in the left breast, unspecified quadrant: Secondary | ICD-10-CM

## 2019-06-29 ENCOUNTER — Ambulatory Visit
Admission: RE | Admit: 2019-06-29 | Discharge: 2019-06-29 | Disposition: A | Discharge status: Home / Self Care | Payer: Medicare Other | Source: Ambulatory Visit | Attending: Internal Medicine | Admitting: Internal Medicine

## 2019-06-29 ENCOUNTER — Other Ambulatory Visit: Payer: Self-pay

## 2019-06-29 DIAGNOSIS — N6320 Unspecified lump in the left breast, unspecified quadrant: Secondary | ICD-10-CM

## 2019-08-01 ENCOUNTER — Other Ambulatory Visit: Payer: Self-pay | Admitting: Cardiology

## 2019-08-15 ENCOUNTER — Telehealth: Payer: Self-pay | Admitting: *Deleted

## 2019-08-15 ENCOUNTER — Encounter: Payer: Self-pay | Admitting: Cardiology

## 2019-08-15 ENCOUNTER — Other Ambulatory Visit: Payer: Self-pay

## 2019-08-15 ENCOUNTER — Ambulatory Visit (INDEPENDENT_AMBULATORY_CARE_PROVIDER_SITE_OTHER): Payer: Medicare Other | Admitting: Cardiology

## 2019-08-15 VITALS — BP 140/82 | HR 49 | Temp 97.4°F | Ht 75.0 in | Wt 252.0 lb

## 2019-08-15 DIAGNOSIS — E785 Hyperlipidemia, unspecified: Secondary | ICD-10-CM | POA: Diagnosis not present

## 2019-08-15 DIAGNOSIS — Z7901 Long term (current) use of anticoagulants: Secondary | ICD-10-CM

## 2019-08-15 DIAGNOSIS — N2889 Other specified disorders of kidney and ureter: Secondary | ICD-10-CM

## 2019-08-15 DIAGNOSIS — N183 Chronic kidney disease, stage 3 unspecified: Secondary | ICD-10-CM

## 2019-08-15 DIAGNOSIS — I6522 Occlusion and stenosis of left carotid artery: Secondary | ICD-10-CM

## 2019-08-15 DIAGNOSIS — Z79899 Other long term (current) drug therapy: Secondary | ICD-10-CM

## 2019-08-15 DIAGNOSIS — I2510 Atherosclerotic heart disease of native coronary artery without angina pectoris: Secondary | ICD-10-CM

## 2019-08-15 DIAGNOSIS — Z9861 Coronary angioplasty status: Secondary | ICD-10-CM

## 2019-08-15 DIAGNOSIS — I480 Paroxysmal atrial fibrillation: Secondary | ICD-10-CM

## 2019-08-15 DIAGNOSIS — I10 Essential (primary) hypertension: Secondary | ICD-10-CM

## 2019-08-15 DIAGNOSIS — Z01818 Encounter for other preprocedural examination: Secondary | ICD-10-CM | POA: Insufficient documentation

## 2019-08-15 LAB — LIPID PANEL
Chol/HDL Ratio: 2.9 ratio (ref 0.0–5.0)
Cholesterol, Total: 132 mg/dL (ref 100–199)
HDL: 46 mg/dL (ref >39)
LDL Chol Calc (NIH): 71 mg/dL (ref 0–99)
Triglycerides: 78 mg/dL (ref 0–149)
VLDL Cholesterol Cal: 15 mg/dL (ref 5–40)

## 2019-08-15 LAB — COMPREHENSIVE METABOLIC PANEL
ALT: 27 IU/L (ref 0–44)
AST: 36 IU/L (ref 0–40)
Albumin/Globulin Ratio: 1.9 (ref 1.2–2.2)
Albumin: 4.3 g/dL (ref 3.7–4.7)
Alkaline Phosphatase: 70 IU/L (ref 39–117)
BUN/Creatinine Ratio: 17 (ref 10–24)
BUN: 27 mg/dL (ref 8–27)
Bilirubin Total: 1.4 mg/dL — ABNORMAL HIGH (ref 0.0–1.2)
CO2: 25 mmol/L (ref 20–29)
Calcium: 9.2 mg/dL (ref 8.6–10.2)
Chloride: 103 mmol/L (ref 96–106)
Creatinine, Ser: 1.58 mg/dL — ABNORMAL HIGH (ref 0.76–1.27)
GFR calc Af Amer: 49 mL/min/{1.73_m2} — ABNORMAL LOW (ref >59)
GFR calc non Af Amer: 42 mL/min/{1.73_m2} — ABNORMAL LOW (ref >59)
Globulin, Total: 2.3 g/dL (ref 1.5–4.5)
Glucose: 85 mg/dL (ref 65–99)
Potassium: 4.9 mmol/L (ref 3.5–5.2)
Sodium: 140 mmol/L (ref 134–144)
Total Protein: 6.6 g/dL (ref 6.0–8.5)

## 2019-08-15 NOTE — Assessment & Plan Note (Signed)
Patient tells me he will need a prostate biopsy in the next few weeks- Dr Junious Silk.  He will need clearance to hold Plavix and Eliquis

## 2019-08-15 NOTE — Telephone Encounter (Signed)
   [facility] Pre-operative Risk Assessment    Request for surgical clearance:  1. What type of surgery is being performed? 08/29/2019   2. When is this surgery scheduled? PROSTATE BIOPSY    3. What type of clearance is required (medical clearance vs. Pharmacy clearance to hold med vs. Both)? BOTH   4. Are there any medications that need to be held prior to surgery and how long?PLAVIX FOR 5 DAYS AND ELIQUIS FOR 3 DAYS PRIOR    5. Practice name and name of physician performing surgery? [facility]    6. What is your office phone number? [phone]    7.   What is your office fax number? [phone]   8.   Anesthesia type (None, local, MAC, general) ?

## 2019-08-15 NOTE — Assessment & Plan Note (Signed)
Pt stopped his Lisinopril 10 mg two weeks ago secondary to symptomatic hypotension.  He brought in readings of Systolic B/Ps in the low 03'U.

## 2019-08-15 NOTE — Assessment & Plan Note (Signed)
Post PCI 2012 CHA2DS2-VASc Score is 4- no recurrance

## 2019-08-15 NOTE — Progress Notes (Addendum)
Cardiology Office Note:    Date:  08/15/2019   ID:  Charles Robinson, DOB [birth year], MRN [id number]  PCP:  [name], MD  Cardiologist:  Glenetta Hew, MD  Electrophysiologist:  None   Referring MD: [name], MD   Chief Complaint  Patient presents with  . Follow-up  pre op clearance   History of Present Illness:    Charles Robinson is a 73 y.o. male with a hx of CAD, he had an LAD stent in 2004, and a CFX/OM PCI June 2012. In September 2012 he was restudied for angina and had OM/CFX disease treated with PCI that was complicated by dissection and PAF. Dr Ellyn Hack ultimately got a good result. He was seen again in April 2013 after an abnormal Myoview suggested ISR. He was studied and had a stent placed to the OM. He essentially has a full metal jacket OM/CFX.  A follow up Myoview in Jan 2014 was low risk. He has done well since his last PCI. He denies chest pain.  He walks 30-40 minutes a day for exercise.  He is seen in the office today for routine follow up but also tells me he will need a prostate biopsy in the next few weeks- Dr Junious Silk.  He will need to hold his Plavix and Eliquis for this.  He also tells me he had symptomatic hypotension late August and stopped his Lisinopril on his own.  He brought in reading from home that confirmed this.  He feels better off the Lisinopril.  His B/P readings are higher- 262-035 systolic.    Past Medical History:  Diagnosis Date  . Arthritis    back  . BPH (benign prostatic hypertrophy)   . Bruit 08/01/2009   doppler - no suggestion of significant diameter reduction, dissection or aneurysmal dilation.  normal evaluation  . CAD S/P multivessel PCI: LAD, RCA and extensive LCX-OM1 03/15/2012   s/p PCI to LAD, to RCA (now occluded), then extensive PCI to Dominant LCx-OM1  (enitre prox-AVG Circ for dissection in 07/2011)  . Cancer ([place])    SKIN CA  . GERD (gastroesophageal reflux disease)   . History of colon polyps   . History of  echocardiogram 08/16/2011   Echo - EF 60-65%; moderate LV concentric hypertrophy; abdnormal LV relaxation (grade 1 diastolic dysfunction; ascending aorta mildly dilated; LA moderately dilated;   . Hyperlipidemia   . Hypertension   . PAF (paroxysmal atrial fibrillation) ([place]) 12/20/2012   followed by Dr. Glenetta Hew; s/p DCCV 2011, 07/2011 post PCI with Type 4a MI; CHA2DS2Vasc = 3, on Warfarin  . Paroxysmal atrial flutter ([place]) 08/20/2011   TEE- atrial septum - no defect identified; RA normal in size, no evidence of thrombus; ascending aorta normal  . Rupture quadriceps tendon     Past Surgical History:  Procedure Laterality Date  . BACK SURGERY    . COLONOSCOPY    . CORONARY ANGIOPLASTY WITH STENT PLACEMENT  03/15/2012   multiple stens in AVGroove Circ & OM1;; 2 site PTCA w/ PTCA and stenting of OM2 ang PTCA of distal stent ISR followed by PTCA of mid LCx ISR  . CORONARY STENT PLACEMENT  2003 - 2011 - 2012 - 2013   Pre-2012 - BMS in RCA now known occluded, BMS in proximal LAD; 07/2011 - 2.25 x 23 BMS in OM 1 with significant disease on either side noted shortly after -->  2 additional overlapping DES 2.5 mm x 30 mm and 2.25 mm x 26 mm in  OM1,; 3 overlapping Resolute DES in AV groove Cx crossing OM1: (Tapered from 3.8-2.6 mm)- Resolute DES 3.5 x 22, 3.0 x 38, 2.5 x 14  . DOPPLER ECHOCARDIOGRAPHY  07/2011   EF ~60-65%, Grade 1 D Dysfxn; mod Conc LVH  . ENDARTERECTOMY Left 02/27/2016   Procedure: LEFT  CAROTID ARTERY ENDARTERECTOMY ;  Surgeon: Serafina Mitchell, MD;  Location: [place] [facility];  Service: Vascular;  Laterality: Left;  . LEFT HEART CATHETERIZATION WITH CORONARY ANGIOGRAM N/A 03/15/2012   Procedure: LEFT HEART CATHETERIZATION WITH CORONARY ANGIOGRAM;  Surgeon: [name], MD;  Location: [facility] CATH LAB: patent LAD stents, patent LCx stents w/80% focal ISR just distal to OM; OM proximal stent open; -- 2 site PTCA-PCI  . LUMBAR LAMINECTOMY/DECOMPRESSION MICRODISCECTOMY Bilateral 12/11/2014    Procedure: Bilateral Lumbar Three-Four Laminectomy;  Surgeon: Kristeen Miss, MD;  Location: [place];  Service: Neurosurgery;  Laterality: Bilateral;  bilateral  . NM MYOVIEW LTD  11/2012   No ischemia or infarction, normal EF & WM  . PATCH ANGIOPLASTY Left 02/27/2016   Procedure: WITH HEMASHIELD DACRON  PATCH ANGIOPLASTY;  Surgeon: Serafina Mitchell, MD;  Location: [facility];  Service: Vascular;  Laterality: Left;  [name] QUADRICEPS TENDON REPAIR  09/19/2012   Procedure: REPAIR QUADRICEP TENDON;  Surgeon: [name], MD;  Location: [facility];  Service: Orthopedics;  Laterality: Left;  [name] VASECTOMY      Current Medications: Current Meds  Medication Sig  . amiodarone (PACERONE) 200 MG tablet TAKE 1 TABLET BY MOUTH EVERY DAY  . clopidogrel (PLAVIX) 75 MG tablet Take 1 tablet (75 mg total) by mouth daily.  . Coenzyme Q10 200 MG capsule Take 200 mg by mouth every morning.   [name] doxazosin (CARDURA) 8 MG tablet Take 4 mg by mouth every evening.   [name] ELIQUIS 5 MG TABS tablet TAKE 1 TABLET BY MOUTH TWICE A DAY  . ezetimibe (ZETIA) 10 MG tablet Take 1 tablet (10 mg total) by mouth daily.  . finasteride (PROSCAR) 5 MG tablet Take 5 mg by mouth every evening.   . metoprolol succinate (TOPROL-XL) 25 MG 24 hr tablet TAKE 1 TABLET BY MOUTH EVERYDAY AT BEDTIME  . Multiple Vitamin (MULTIVITAMIN WITH MINERALS) TABS tablet Take 1 tablet by mouth every morning.   . nitroGLYCERIN (NITROSTAT) 0.4 MG SL tablet PLACE 1 TABLET (0.4 MG TOTAL) UNDER THE TONGUE EVERY 5 (FIVE) MINUTES AS NEEDED. FOR CHEST PAIN  . Omega-3 Fatty Acids (FISH OIL) 1000 MG CAPS Take 1,000 capsules by mouth 2 (two) times daily.   . rosuvastatin (CRESTOR) 40 MG tablet TAKE 1 TABLET BY MOUTH DAILY     Allergies:   Lipitor [atorvastatin], Morphine and related, and Vicodin [hydrocodone-acetaminophen]   Social History   Socioeconomic History  . Marital status: Married    Spouse name: Not on file  . Number of children: Not on file  . Years of  education: Not on file  . Highest education level: Not on file  Occupational History  . Not on file  Social Needs  . Financial resource strain: Not on file  . Food insecurity    Worry: Not on file    Inability: Not on file  . Transportation needs    Medical: Not on file    Non-medical: Not on file  Tobacco Use  . Smoking status: Never Smoker  . Smokeless tobacco: Never Used  Substance and Sexual Activity  . Alcohol use: Yes    Alcohol/week: 2.0 standard drinks  Types: 2 Cans of beer per week    Comment: rare  . Drug use: No  . Sexual activity: Not Currently  Lifestyle  . Physical activity    Days per week: Not on file    Minutes per session: Not on file  . Stress: Not on file  Relationships  . Social Herbalist on phone: Not on file    Gets together: Not on file    Attends religious service: Not on file    Active member of club or organization: Not on file    Attends meetings of clubs or organizations: Not on file    Relationship status: Not on file  Other Topics Concern  . Not on file  Social History Narrative   [name] is a father of 2, grandfather of 49.  His daughter was classmates with Dr. Ellyn Hack in college.   He and his wife are currently in the process of moving houses, further down the street in order to downsize to a single-story house.  He has been working out at [employer].           Family History: The patient's family history includes Heart disease in his father.  ROS:   Please see the history of present illness.  All other systems reviewed and are negative.  EKGs/Labs/Other Studies Reviewed:    The following studies were reviewed today: Myoview Jan 2014  EKG:  EKG is ordered today.  The ekg ordered today demonstrates NSR, SB 48 (chronic)  Recent Labs: 10/05/2018: ALT 22; TSH 2.990  Recent Lipid Panel    Component Value Date/Time   CHOL 139 10/05/2018 0805   TRIG 80 10/05/2018 0805   TRIG 75 03/26/2016   HDL 48 10/05/2018 0805    CHOLHDL 2.9 10/05/2018 0805   CHOLHDL 2.7 07/29/2016 0814   VLDL 12 07/29/2016 0814   LDLCALC 75 10/05/2018 0805   LDLCALC 89 03/26/2016    Physical Exam:    VS:  BP 140/82   Pulse (!) 49   Temp (!) 97.4 F (36.3 C) (Temporal)   Ht 6\' 3"  (1.905 m)   Wt 252 lb (114.3 kg)   SpO2 96%   BMI 31.50 kg/m     Wt Readings from Last 3 Encounters:  08/15/19 252 lb (114.3 kg)  10/31/18 265 lb (120.2 kg)  06/15/18 263 lb 12.8 oz (119.7 kg)     GEN:  Well nourished, well developed in no acute distress HEENT: Normal NECK: No JVD; No carotid bruits LYMPHATICS: No lymphadenopathy CARDIAC: RRR, no murmurs, rubs, gallops RESPIRATORY:  Clear to auscultation without rales, wheezing or rhonchi  ABDOMEN: Soft, non-tender, non-distended MUSCULOSKELETAL:  No edema; No deformity  SKIN: Warm and dry NEUROLOGIC:  Alert and oriented x 3 PSYCHIATRIC:  Normal affect   ASSESSMENT:    CAD, s/p multiple PCIs s/p PCI to LAD in 2004. The RCA is known to be occluded. He has had multiple interventions to the OM/ CFX- June 2012, Sept 2012, and April 2013. Basically a CFX /OM full metal jacket. Myoview: January 2014: Complete resolution of ischemia previously noted, normal ejection fraction, no infarction. He has had no recent chest pain.  He walks 30-40 minutes daily for exercise.  Pre-operative clearance Patient tells me he will need a prostate biopsy in the next few weeks- Dr Junious Silk.  He will need clearance to hold Plavix and Eliquis  Chronic renal insufficiency, stage III (moderate) (HCC) SCr 1.29 Mar 2019- followed by Dr Brigitte Pulse. I'll check  a BMP today as we are drawing lipids- the patient stopped his Lisinopril 2 weeks ago secondary to symptomatic low B/P.  Essential hypertension Pt stopped his Lisinopril 10 mg two weeks ago secondary to symptomatic hypotension.  He brought in readings of Systolic B/Ps in the low 76'A.   PAF (paroxysmal atrial fibrillation) ([place])  Post PCI 2012 CHA2DS2-VASc Score  is 4- no recurrance  Left carotid artery stenosis S/P LCE April 2017- Dr Trula Slade follows.  On amiodarone therapy Check LFTs with labs today  Dyslipidemia, goal LDL below 70 Check lipids- consider addition of Zetia if LDL remains > 70.   PLAN:     From a cardiac standpoint he is an acceptable risk for the proposed procedure (prostate biopsy) without further cardiac work up. I'll discuss holding his Plavix with Dr Ellyn Hack, and holding Eliquis with our pharmacist. I have asked the patient to request Dr Lyndal Rainbow office send [place] an official pre op clearance request.  I ordered fasting lipids and CMET today.  I have asked the patient to let dr Brigitte Pulse know he has stopped his Lisinopril and I have asked him to monitor his B/P at home.   F/U Dr Ellyn Hack in 6 months.   Addendum: Reviewed with Dr Ellyn Hack, [place] to hold Plavix and Eliquis per pharmacy protocol for surgery.   Medication Adjustments/Labs and Tests Ordered: Current medicines are reviewed at length with the patient today.  Concerns regarding medicines are outlined above.  Orders Placed This Encounter  Procedures  . Comprehensive Metabolic Panel (CMET)  . Lipid panel  . EKG 12-Lead   No orders of the defined types were placed in this encounter.   Patient Instructions  Medication Instructions:  Your physician recommends that you continue on your current medications as directed. Please refer to the Current Medication list given to you today. If you need a refill on your cardiac medications before your next appointment, please call your pharmacy.   Lab work: Your physician recommends that you return for lab work in: TODAY-CMET, LIPID If you have labs (blood work) drawn today and your tests are completely normal, you will receive your results only by: [name] MyChart Message (if you have MyChart) OR . A paper copy in the mail If you have any lab test that is abnormal or we need to change your treatment, we will call you to review the  results.  Testing/Procedures: NONE   Follow-Up: At [facility], you and your health needs are our priority.  As part of our continuing mission to provide you with exceptional heart care, we have created designated Provider Care Teams.  These Care Teams include your primary Cardiologist (physician) and Advanced Practice Providers (APPs -  Physician Assistants and Nurse Practitioners) who all work together to provide you with the care you need, when you need it. You will need a follow up appointment in 6 months.  Please call our office 2 months in advance to schedule this appointment.  You may see Dr Glenetta Hew or one of the following Advanced Practice Providers on your designated Care Team:   Rosaria Ferries, PA-C . Jory Sims, DNP, ANP  Any Other Special Instructions Will Be Listed Below (If Applicable).       Angelena Form, PA-C  08/15/2019 9:37 AM    [facility]

## 2019-08-15 NOTE — Patient Instructions (Signed)
Medication Instructions:  Your physician recommends that you continue on your current medications as directed. Please refer to the Current Medication list given to you today. If you need a refill on your cardiac medications before your next appointment, please call your pharmacy.   Lab work: Your physician recommends that you return for lab work in: TODAY-CMET, LIPID If you have labs (blood work) drawn today and your tests are completely normal, you will receive your results only by: [name] MyChart Message (if you have MyChart) OR . A paper copy in the mail If you have any lab test that is abnormal or we need to change your treatment, we will call you to review the results.  Testing/Procedures: NONE   Follow-Up: At [facility], you and your health needs are our priority.  As part of our continuing mission to provide you with exceptional heart care, we have created designated Provider Care Teams.  These Care Teams include your primary Cardiologist (physician) and Advanced Practice Providers (APPs -  Physician Assistants and Nurse Practitioners) who all work together to provide you with the care you need, when you need it. You will need a follow up appointment in 6 months.  Please call our office 2 months in advance to schedule this appointment.  You may see Dr Glenetta Hew or one of the following Advanced Practice Providers on your designated Care Team:   Rosaria Ferries, PA-C . Jory Sims, DNP, ANP  Any Other Special Instructions Will Be Listed Below (If Applicable).

## 2019-08-15 NOTE — Assessment & Plan Note (Signed)
SCr 1.29 Mar 2019- followed by Dr Brigitte Pulse. I'll check a BMP today as we are drawing lipids- the patient stopped his Lisinopril 2 weeks ago secondary to symptomatic low B/P.

## 2019-08-15 NOTE — Assessment & Plan Note (Addendum)
s/p PCI to LAD in 2004. The RCA is known to be occluded. He has had multiple interventions to the OM/ CFX- June 2012, Sept 2012, and April 2013. Basically a CFX /OM full metal jacket. Myoview: January 2014: Complete resolution of ischemia previously noted, normal ejection fraction, no infarction. He has had no recent chest pain.  He walks 30-40 minutes daily for exercise.

## 2019-08-15 NOTE — Assessment & Plan Note (Signed)
Check lipids- consider addition of Zetia if LDL remains > 70.

## 2019-08-15 NOTE — Assessment & Plan Note (Signed)
S/P LCE April 2017- Dr Trula Slade follows.

## 2019-08-15 NOTE — Assessment & Plan Note (Signed)
Check LFTs with labs today

## 2019-08-16 NOTE — Telephone Encounter (Signed)
Patient was cleared yesterday by Kerin Ransom for the biopsy, will need to followup on the decision regarding plavix and eliquis

## 2019-08-17 NOTE — Telephone Encounter (Signed)
Patient with diagnosis of afib on Eliquis for anticoagulation.    Procedure: PROSTATE BIOPSY Date of procedure: 08/29/2019  CHADS2-VASc score of  4 (HTN, AGE,  CAD, AGE)  CrCl 55 ml/min  Per office protocol, patient can hold Eliquis for 3 days prior to procedure.

## 2019-08-17 NOTE — Telephone Encounter (Signed)
Please provide recommendations for eliquis.

## 2019-08-17 NOTE — Telephone Encounter (Signed)
   Primary Cardiologist: Glenetta Hew, MD  Chart reviewed as part of pre-operative protocol coverage. Patient was contacted 08/17/2019 in reference to pre-operative risk assessment for pending surgery as outlined below.  [name] was last seen on 08/15/19 by Kerin [facility] and was deemed acceptable risk for planned procedure. Per Dr. Ellyn Hack, [place] to hold Plavix.   Per our clinical pharmacist: Patient with diagnosis of afib on Eliquis for anticoagulation.    Procedure: PROSTATE BIOPSY Date of procedure: 08/29/2019  CHADS2-VASc score of  4 (HTN, AGE,  CAD, AGE)  CrCl 55 ml/min  Per office protocol, patient can hold Eliquis for 3 days prior to procedure.    Therefore, based on ACC/AHA guidelines, the patient would be at acceptable risk for the planned procedure without further cardiovascular testing.   I will route this recommendation to the requesting party via Epic fax function and remove from pre-op pool.  Please call with questions.  Mason, PA 08/17/2019, 5:42 PM

## 2019-08-18 NOTE — Telephone Encounter (Signed)
See recommendation below: 1. Patient is cleared to proceed with procedure without further cardiac workup  2. Dr. Ellyn Hack recommended to hold plavix for 7 days prior to the surgery and restart as soon as possible afterward at the surgeon's discretion.  3. Our clinical pharmacist recommended to hold eliquis for 3 days prior to surgery and restart after surgery at the surgeon's discretion  Callback pool to inform patient, I will forward this note to the requesting provider

## 2019-08-18 NOTE — Telephone Encounter (Signed)
Patient was called and informed to hold his Eliquis for 3 days and his Plavix for 7 days and to restart as soon as possible afterwards. Patient verbalized an understanding and all (if any) questions were answered.

## 2019-08-18 NOTE — Telephone Encounter (Signed)
Hold Plavix for 7 days prior to procedure.  Glenetta Hew, MD

## 2019-08-18 NOTE — Telephone Encounter (Signed)
Dr. Ellyn Hack, please comment on how long to hold plavix for 5 days.

## 2019-08-31 ENCOUNTER — Other Ambulatory Visit: Payer: Self-pay | Admitting: Cardiology

## 2019-09-12 ENCOUNTER — Other Ambulatory Visit: Payer: Self-pay | Admitting: Cardiology

## 2019-09-24 DIAGNOSIS — C61 Malignant neoplasm of prostate: Secondary | ICD-10-CM

## 2019-09-24 HISTORY — DX: Malignant neoplasm of prostate: C61

## 2019-10-16 ENCOUNTER — Other Ambulatory Visit (HOSPITAL_COMMUNITY): Payer: Self-pay | Admitting: Urology

## 2019-10-16 DIAGNOSIS — C61 Malignant neoplasm of prostate: Secondary | ICD-10-CM

## 2019-10-26 ENCOUNTER — Other Ambulatory Visit: Payer: Self-pay | Admitting: Cardiology

## 2019-10-30 ENCOUNTER — Other Ambulatory Visit: Payer: Self-pay

## 2019-10-30 ENCOUNTER — Ambulatory Visit (HOSPITAL_COMMUNITY)
Admission: RE | Admit: 2019-10-30 | Discharge: 2019-10-30 | Disposition: A | Discharge status: Home / Self Care | Payer: Medicare Other | Source: Ambulatory Visit | Attending: Urology | Admitting: Urology

## 2019-10-30 ENCOUNTER — Encounter (HOSPITAL_COMMUNITY)
Admission: RE | Admit: 2019-10-30 | Discharge: 2019-10-30 | Disposition: A | Discharge status: Home / Self Care | Payer: Medicare Other | Source: Ambulatory Visit | Attending: Urology | Admitting: Urology

## 2019-10-30 DIAGNOSIS — C61 Malignant neoplasm of prostate: Secondary | ICD-10-CM | POA: Diagnosis not present

## 2019-10-30 MED ORDER — TECHNETIUM TC 99M MEDRONATE IV KIT
20.0000 | PACK | Freq: Once | INTRAVENOUS | Status: AC | PRN
  Administered 2019-10-30: 20 via INTRAVENOUS

## 2019-11-01 ENCOUNTER — Encounter: Payer: Self-pay | Admitting: *Deleted

## 2019-11-02 ENCOUNTER — Encounter: Payer: Self-pay | Admitting: Radiation Oncology

## 2019-11-02 ENCOUNTER — Encounter: Payer: Self-pay | Admitting: *Deleted

## 2019-11-02 NOTE — Progress Notes (Signed)
GU Location of Tumor / Histology: prostatic adenocarcinoma  If Prostate Cancer, Gleason Score is (4 + 5) and PSA is (20.70). Prostate volume: 69 grams      Biopsies of prostate (if applicable) revealed:   Past/Anticipated interventions by urology, if any: prostate biopsy, finasteride, CT (suspicious for extracapsular extension), bone scan (questionable thoracic and lumbar mets)-MRI recommended, ADT ???, referral to [facility]  Past/Anticipated interventions by medical oncology, if any: no  Weight changes, if any: reports intentional weight loss.  Bowel/Bladder complaints, if any: IPSS 16. SHIM 1. Denies dysuria or hematuria. Denies urinary leakage or incontinence.  Nausea/Vomiting, if any: no  Pain issues, if any:  denies  SAFETY ISSUES:  Prior radiation? denies  Pacemaker/ICD? denies  Possible current pregnancy? no, male patient  Is the patient on methotrexate? no  Current Complaints / other details: Preferred name: [age] year old male. Married. Retired.

## 2019-11-03 ENCOUNTER — Other Ambulatory Visit: Payer: Self-pay | Admitting: Cardiology

## 2019-11-03 ENCOUNTER — Ambulatory Visit
Admission: RE | Admit: 2019-11-03 | Discharge: 2019-11-03 | Disposition: A | Discharge status: Home / Self Care | Payer: Medicare Other | Source: Ambulatory Visit | Attending: Radiation Oncology | Admitting: Radiation Oncology

## 2019-11-03 ENCOUNTER — Encounter: Payer: Self-pay | Admitting: Radiation Oncology

## 2019-11-03 ENCOUNTER — Other Ambulatory Visit: Payer: Self-pay

## 2019-11-03 DIAGNOSIS — C61 Malignant neoplasm of prostate: Secondary | ICD-10-CM

## 2019-11-03 NOTE — Progress Notes (Signed)
Radiation Oncology         [phone] ________________________________  Initial outpatient Consultation - Conducted via Telephone due to current COVID-19 concerns for limiting patient exposure  Name: Charles Robinson MRN: [id number]  Date: 11/03/2019  DOB: [birth year]  EX:HBZJ, Charles Saxon, MD  Festus Aloe, MD   REFERRING PHYSICIAN: Festus Aloe, MD  DIAGNOSIS: 73 y.o. 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    ICD-10-CM   1. Malignant neoplasm of prostate ([place])  C61     HISTORY OF PRESENT ILLNESS: Charles Robinson "[name]" is a 73 y.o. male with a diagnosis of prostate cancer. He has a longstanding history of elevated PSA followed closely under the care of [name] for many years. In 11/2007 his PSA was elevated at 5.89 but a prostate biopsy at that time showed no malignancy.  His PSA continued to climb to 8.4 in 2015, and he was started on finasteride at that time. Unfortunately, his PSA continued to rise to 9.53 on the finasteride (19 when adjusted for finasteride), so he underwent repeat biopsy on 09/03/2014. This biopsy was again benign with one core showing a small focus of atypical glands. He continued on PSA surveillance and underwent prostate MRI in 01/2015, which was negative for any evidence of high grade disease or concerning findings, with a PSA of 11 at the time.  PSA continued to rise at 14 in 01/2016 (PSA 28 adjusted or finatsteride) so a repeat biopsy was performed on 06/17/2016 which was again, benign. PSA remained elevated but relatively stable at 14.9 (30 adjusted for finasteride) in 08/2018 so he proceeded with a repeat prostate MRI in 10/2018 which was also benign, .  Most recently, his PSA jumped to 20.7 (40 adjusted for finasteride) in 05/2019, which prompted a repeat prostate biopsy which was performed on 10/10/2019.  The prostate volume measured 69.32 cc.  Out of 12 core biopsies,  6 were positive.  The maximum Gleason score was 4+5, and this was seen in the right apex (small focus), the left apex, and the left mid. Additionally, Gleason 4+4 was seen in the left base, Gleason 4+3 in the right base, and Gleason 3+4 in the right base lateral. No perineural invasion or extraprostatic extension was noted.  He underwent CT A/P for disease staging on 10/23/2019, which showed increased asymmetric enlargement of the prostate gland, left greater than right, with findings highly suspicious for extraprostatic extension but no evidence of lymphadenopathy or distant metastatic disease within the abdomen or pelvis.  A bone scan was also performed on 10/30/2019 for disease staging and this showed a low uptake of radiotracer within the thoracic and lumbar spine, likely degenerative changes; and no other focal bony lesions noted to suggest metastatic disease.  He had a prior MRI lumbar spine in 12/2018 which showed degenerative disc disease only and no evidence of suspicious osseous metastatic lesions.   The patient reviewed the biopsy results with his urologist and he has kindly been referred today for discussion of potential radiation treatment options.   PREVIOUS RADIATION THERAPY: No  PAST MEDICAL HISTORY:  Past Medical History:  Diagnosis Date  . Arthritis    back  . BPH (benign prostatic hypertrophy)   . Bruit 08/01/2009   doppler - no suggestion of significant diameter reduction, dissection or aneurysmal dilation.  normal evaluation  . CAD S/P multivessel PCI: LAD, RCA and extensive LCX-OM1 03/15/2012   s/p PCI to  LAD, to RCA (now occluded), then extensive PCI to Dominant LCx-OM1  (enitre prox-AVG Circ for dissection in 07/2011)  . Cancer ([place])    SKIN CA  . GERD (gastroesophageal reflux disease)   . History of colon polyps   . History of echocardiogram 08/16/2011   Echo - EF 60-65%; moderate LV concentric hypertrophy; abdnormal LV relaxation (grade 1 diastolic dysfunction;  ascending aorta mildly dilated; LA moderately dilated;   . Hyperlipidemia   . Hypertension   . PAF (paroxysmal atrial fibrillation) ([place]) 12/20/2012   followed by Dr. Glenetta Hew; s/p DCCV 2011, 07/2011 post PCI with Type 4a MI; CHA2DS2Vasc = 3, on Warfarin  . Paroxysmal atrial flutter ([place]) 08/20/2011   TEE- atrial septum - no defect identified; RA normal in size, no evidence of thrombus; ascending aorta normal  . Prostate cancer ([place])   . Rupture quadriceps tendon       PAST SURGICAL HISTORY: Past Surgical History:  Procedure Laterality Date  . BACK SURGERY    . COLONOSCOPY    . CORONARY ANGIOPLASTY WITH STENT PLACEMENT  03/15/2012   multiple stens in AVGroove Circ & OM1;; 2 site PTCA w/ PTCA and stenting of OM2 ang PTCA of distal stent ISR followed by PTCA of mid LCx ISR  . CORONARY STENT PLACEMENT  2003 - 2011 - 2012 - 2013   Pre-2012 - BMS in RCA now known occluded, BMS in proximal LAD; 07/2011 - 2.25 x 23 BMS in OM 1 with significant disease on either side noted shortly after -->  2 additional overlapping DES 2.5 mm x 30 mm and 2.25 mm x 26 mm in OM1,; 3 overlapping Resolute DES in AV groove Cx crossing OM1: (Tapered from 3.8-2.6 mm)- Resolute DES 3.5 x 22, 3.0 x 38, 2.5 x 14  . DOPPLER ECHOCARDIOGRAPHY  07/2011   EF ~60-65%, Grade 1 D Dysfxn; mod Conc LVH  . ENDARTERECTOMY Left 02/27/2016   Procedure: LEFT  CAROTID ARTERY ENDARTERECTOMY ;  Surgeon: Serafina Mitchell, MD;  Location: [facility];  Service: Vascular;  Laterality: Left;  . LEFT HEART CATHETERIZATION WITH CORONARY ANGIOGRAM N/A 03/15/2012   Procedure: LEFT HEART CATHETERIZATION WITH CORONARY ANGIOGRAM;  Surgeon: [name], MD;  Location: [facility] CATH LAB: patent LAD stents, patent LCx stents w/80% focal ISR just distal to OM; OM proximal stent open; -- 2 site PTCA-PCI  . LUMBAR LAMINECTOMY/DECOMPRESSION MICRODISCECTOMY Bilateral 12/11/2014   Procedure: Bilateral Lumbar Three-Four Laminectomy;  Surgeon: Kristeen Miss, MD;  Location:  [facility] NEURO ORS;  Service: Neurosurgery;  Laterality: Bilateral;  bilateral  . NM MYOVIEW LTD  11/2012   No ischemia or infarction, normal EF & WM  . PATCH ANGIOPLASTY Left 02/27/2016   Procedure: WITH HEMASHIELD DACRON  PATCH ANGIOPLASTY;  Surgeon: Serafina Mitchell, MD;  Location: [facility];  Service: Vascular;  Laterality: Left;  [name] QUADRICEPS TENDON REPAIR  09/19/2012   Procedure: REPAIR QUADRICEP TENDON;  Surgeon: [name], MD;  Location: [facility];  Service: Orthopedics;  Laterality: Left;  [name] VASECTOMY      FAMILY HISTORY:  Family History  Problem Relation Age of Onset  . Leukemia Mother   . Heart disease Father   . Bladder Cancer Sister   . Prostate cancer Brother   . Breast cancer Maternal Grandmother     SOCIAL HISTORY:  Social History   Socioeconomic History  . Marital status: Married    Spouse name: Not on file  . Number of children: 2  . Years of education: Not  on file  . Highest education level: Not on file  Occupational History  . Not on file  Tobacco Use  . Smoking status: Never Smoker  . Smokeless tobacco: Never Used  Substance and Sexual Activity  . Alcohol use: Yes    Alcohol/week: 2.0 standard drinks    Types: 2 Cans of beer per week    Comment: rare  . Drug use: No  . Sexual activity: Not Currently  Other Topics Concern  . Not on file  Social History Narrative   [name] is a father of 2, grandfather of 82.  His daughter was classmates with Dr. Ellyn Hack in college.   He and his wife are currently in the process of moving houses, further down the street in order to downsize to a single-story house.  He has been working out at [employer].         Social Determinants of Health   Financial Resource Strain:   . Difficulty of Paying Living Expenses: Not on file  Food Insecurity:   . Worried About Charity fundraiser in the Last Year: Not on file  . Ran Out of Food in the Last Year: Not on file  Transportation Needs:   . Lack of Transportation (Medical): Not on  file  . Lack of Transportation (Non-Medical): Not on file  Physical Activity:   . Days of Exercise per Week: Not on file  . Minutes of Exercise per Session: Not on file  Stress:   . Feeling of Stress : Not on file  Social Connections:   . Frequency of Communication with Friends and Family: Not on file  . Frequency of Social Gatherings with Friends and Family: Not on file  . Attends Religious Services: Not on file  . Active Member of Clubs or Organizations: Not on file  . Attends Archivist Meetings: Not on file  . Marital Status: Not on file  Intimate Partner Violence:   . Fear of Current or Ex-Partner: Not on file  . Emotionally Abused: Not on file  . Physically Abused: Not on file  . Sexually Abused: Not on file    ALLERGIES: Lipitor [atorvastatin], Morphine and related, and Vicodin [hydrocodone-acetaminophen]  MEDICATIONS:  Current Outpatient Medications  Medication Sig Dispense Refill  . amiodarone (PACERONE) 200 MG tablet TAKE 1 TABLET BY MOUTH EVERY DAY 90 tablet 2  . clopidogrel (PLAVIX) 75 MG tablet Take 1 tablet (75 mg total) by mouth daily. 90 tablet 1  . Coenzyme Q10 200 MG capsule Take 200 mg by mouth every morning.     [name] doxazosin (CARDURA) 8 MG tablet Take 4 mg by mouth every evening.     [name] ELIQUIS 5 MG TABS tablet TAKE 1 TABLET BY MOUTH TWICE A DAY 180 tablet 2  . ezetimibe (ZETIA) 10 MG tablet TAKE 1 TABLET BY MOUTH EVERY DAY 90 tablet 2  . finasteride (PROSCAR) 5 MG tablet Take 5 mg by mouth every evening.     . metoprolol succinate (TOPROL-XL) 25 MG 24 hr tablet TAKE 1 TABLET BY MOUTH EVERYDAY AT BEDTIME 90 tablet 2  . Multiple Vitamin (MULTIVITAMIN WITH MINERALS) TABS tablet Take 1 tablet by mouth every morning.     . Omega-3 Fatty Acids (FISH OIL) 1000 MG CAPS Take 1,000 capsules by mouth 2 (two) times daily.     . rosuvastatin (CRESTOR) 40 MG tablet TAKE 1 TABLET BY MOUTH EVERY DAY 90 tablet 1  . nitroGLYCERIN (NITROSTAT) 0.4 MG SL tablet PLACE 1  TABLET (0.4 MG TOTAL) UNDER THE TONGUE EVERY 5 (FIVE) MINUTES AS NEEDED. FOR CHEST PAIN (Patient not taking: Reported on 11/03/2019) 25 tablet 4   No current facility-administered medications for this encounter.    REVIEW OF SYSTEMS:  On review of systems, the patient reports that he is doing well overall. He denies any chest pain, shortness of breath, cough, fevers, chills, night sweats, unintended weight changes. He denies any bowel disturbances, and denies abdominal pain, nausea or vomiting. He denies any new musculoskeletal or joint aches or pains. His IPSS was 16, indicating moderate urinary symptoms. His SHIM was 1, indicating he has severe erectile dysfunction. A complete review of systems is obtained and is otherwise negative.    PHYSICAL EXAM:  Wt Readings from Last 3 Encounters:  11/03/19 247 lb (112 kg)  08/15/19 252 lb (114.3 kg)  10/31/18 265 lb (120.2 kg)   Temp Readings from Last 3 Encounters:  08/15/19 (!) 97.4 F (36.3 C) (Temporal)  10/31/18 (!) 97.2 F (36.2 C) (Oral)  10/18/17 98.5 F (36.9 C) (Oral)   BP Readings from Last 3 Encounters:  08/15/19 140/82  10/31/18 129/73  06/15/18 (!) 148/84   Pulse Readings from Last 3 Encounters:  08/15/19 (!) 49  10/31/18 (!) 56  06/15/18 (!) 52   Pain Assessment Pain Score: 0-No pain/10  Physical exam not performed in light of telephone consult visit format.   KPS = 90  100 - Normal; no complaints; no evidence of disease. 90   - Able to carry on normal activity; minor signs or symptoms of disease. 80   - Normal activity with effort; some signs or symptoms of disease. 36   - Cares for self; unable to carry on normal activity or to do active work. 60   - Requires occasional assistance, but is able to care for most of his personal needs. 50   - Requires considerable assistance and frequent medical care. 41   - Disabled; requires special care and assistance. 62   - Severely disabled; hospital admission is indicated  although death not imminent. 51   - Very sick; hospital admission necessary; active supportive treatment necessary. 10   - Moribund; fatal processes progressing rapidly. 0     - Dead  Karnofsky DA, Abelmann [place], Craver LS and Burchenal [facility] [phone]) The use of the nitrogen mustards in the palliative treatment of carcinoma: with particular reference to bronchogenic carcinoma Cancer 1 634-56  LABORATORY DATA:  Lab Results  Component Value Date   WBC 5.7 02/28/2016   HGB 12.1 (L) 02/28/2016   HCT 36.9 (L) 02/28/2016   MCV 92.5 02/28/2016   PLT 126 (L) 02/28/2016   Lab Results  Component Value Date   NA 140 08/15/2019   K 4.9 08/15/2019   CL 103 08/15/2019   CO2 25 08/15/2019   Lab Results  Component Value Date   ALT 27 08/15/2019   AST 36 08/15/2019   ALKPHOS 70 08/15/2019   BILITOT 1.4 (H) 08/15/2019     RADIOGRAPHY: NM Bone Scan Whole Body  Result Date: 10/31/2019 CLINICAL DATA:  Prostate cancer. EXAM: NUCLEAR MEDICINE WHOLE BODY BONE SCAN TECHNIQUE: Whole body anterior and posterior images were obtained approximately 3 hours after intravenous injection of radiopharmaceutical. RADIOPHARMACEUTICALS:  20.0 mCi Technetium-61m MDP IV COMPARISON:  CT 10/23/2019.  MRI lumbar spine 01/11/2019. FINDINGS: Bilateral renal function and excretion. Mild thoracolumbar spine scoliosis. Very mild multifocal areas of increased activity noted throughout the thoracic and lumbar spine. These changes are most  likely degenerative. Metastatic disease cannot be entirely excluded. No other focal bony abnormalities are identified. IMPRESSION: Very mild multifocal areas of increased activity noted throughout the thoracic and lumbar spine. These changes are most likely degenerative. Metastatic disease cannot be entirely excluded. Follow-up MRI imaging of the thoracic and lumbar spine can be obtained as needed. No other focal bony lesions noted to suggest metastatic disease. Electronically Signed   By: Marcello Moores   Register   On: 10/31/2019 05:19      IMPRESSION/PLAN: This visit was conducted via Telephone to spare the patient unnecessary potential exposure in the healthcare setting during the current COVID-19 pandemic. 1. 73 y.o. gentleman with Stage T2c adenocarcinoma of the prostate with Gleason Score of 4+5, and PSA of 20 (40 adjusted for finasteride). We discussed the patient's workup and outlined the nature of prostate cancer in this setting. The patient's T stage, Gleason's score, and PSA put him into the high risk group. Accordingly, he is eligible for a variety of potential treatment options including prostatectomy or LT-ADT in combination with either 8 weeks of external radiation or 5 weeks of external radiation followed by a brachytherapy boost. We discussed the available radiation techniques, and focused on the details and logistics of delivery. He is not felt to be an ideal candidate for brachytherapy boost with moderate LUTS and a prostate volume of 69 gm, despite having been on finasteride for many years. We discussed and outlined the risks, benefits, short and long-term effects associated with radiotherapy and compared and contrasted these with prostatectomy. We discussed the role of SpaceOAR in reducing the rectal toxicity associated with radiotherapy. We also detailed the role of ADT in the treatment of high risk prostate cancer and outlined the associated side effects that could be expected with this therapy.  We discussed the rationale for the intentional delay of starting radiotherapy approximately 2 months after the start of ADT to allow for the radiosensitizing effects of this treatment to take place.  He and his wife were encouraged to ask questions that were answered to their stated satisfaction.  He appears to have a good understanding of his disease and our recommendations which are of curative intent.  At the end of the conversation, the patient is interested in moving forward with 8 weeks of  external beam therapy in combination with LT-ADT. He has not received his first Lupron injection. We will share our discussion with [name] and move forward with coordinating a follow up appointment at [facility] for start of ADT now.  We will also coordinate and fiducial markers and SpaceOAR gel placement prior to simulation in Feb 2021, to reduce rectal toxicity from radiotherapy. We anticipate beginning an 8 week course of daily prostate IMRT in mid February 2021. He knows to call at any time in the interim with any questions or concerns.  We enjoyed meeting him and his wife today and look forward to participating in his care.    Given current concerns for patient exposure during the COVID-19 pandemic, this encounter was conducted via telephone. The patient was notified in advance and was offered a MyChart meeting to allow for face to face communication but unfortunately reported that he did not have the appropriate resources/technology to support such a visit and instead preferred to proceed with telephone consult. The patient has given verbal consent for this type of encounter. The time spent during this encounter was 60 minutes. The attendants for this meeting include Tyler Pita MD, Ashlyn Bruning PA-C, [place],  patient, DEEPAK BLESS and his wife. During the encounter, Tyler Pita MD, Ashlyn Bruning PA-C, and scribe, Wilburn Mylar were located at [place].  Patient, DEIONTAE RABEL and his wife were located at home.    Nicholos Johns, PA-C    Tyler Pita, MD  [facility] Direct Dial: [phone]  Fax: [phone] [email or web address]  Skype  [email or web address]  This document serves as a record of services personally performed by Tyler Pita, MD and Freeman Caldron, PA-C. It was created on their behalf by Wilburn Mylar, a trained medical scribe. The creation of this record is  based on the scribe's personal observations and the provider's statements to them. This document has been checked and approved by the attending provider.

## 2019-11-04 DIAGNOSIS — C61 Malignant neoplasm of prostate: Secondary | ICD-10-CM | POA: Insufficient documentation

## 2019-11-06 ENCOUNTER — Telehealth: Payer: Self-pay | Admitting: *Deleted

## 2019-11-06 NOTE — Telephone Encounter (Signed)
Called patient to inform of new date for ADT - 11/20/19 @ 8:30 am , no answer will call later

## 2019-11-06 NOTE — Telephone Encounter (Signed)
CALLED PATIENT TO INFORM OF ADT INJ. @ DR. Lyndal Rainbow OFFICE ON 12-06-19 - ARRIVAL TIME- 1:15 PM, LVM FOR A RETURN CALL

## 2019-11-13 ENCOUNTER — Telehealth: Payer: Self-pay | Admitting: Medical Oncology

## 2019-11-13 NOTE — Telephone Encounter (Signed)
Spoke with patient to introduce myself as the prostate nurse navigator and discuss my role. I was unable to meet him 12/11, when he consulted with Dr. Tammi Klippel. He states the consult went well and will being starting ADT 12/28 followed by 8 weeks of radiation. I gave him my contact information and asked him to contact me with questions or concerns. I also discussed our prostate support group and asked him to let me know if he is interested and he will added to the group e-mail. He voiced understanding and was very appreciative of my call.

## 2019-11-23 ENCOUNTER — Encounter: Payer: Self-pay | Admitting: Medical Oncology

## 2019-11-23 NOTE — Progress Notes (Signed)
Charles Robinson, called stating he received his ADT 12/25. He is not sure if he needs to make an appointment for the 3 seeds or not. I informed him, he will not start radiation for approximately 8 weeks and he will be scheduled in early February for fiducial markers and [employer]. I explained Enid Derry, will arrange this appointment and call him with date and time. He voiced understanding and thanked me for the return call.

## 2019-12-07 ENCOUNTER — Ambulatory Visit: Payer: Medicare Other | Attending: Internal Medicine

## 2019-12-07 DIAGNOSIS — Z23 Encounter for immunization: Secondary | ICD-10-CM | POA: Insufficient documentation

## 2019-12-07 NOTE — Progress Notes (Signed)
   Covid-19 Vaccination Clinic  Name:  Charles Robinson    MRN: [id number] DOB: February 11, 1946  12/07/2019  [name] was observed post Covid-19 immunization for 30 minutes based on pre-vaccination screening without incidence. He was provided with Vaccine Information Sheet and instruction to access the V-Safe system.   [name] was instructed to call 911 with any severe reactions post vaccine: [name] Difficulty breathing  . Swelling of your face and throat  . A fast heartbeat  . A bad rash all over your body  . Dizziness and weakness    Immunizations Administered    Name Date Dose VIS Date Route   Pfizer COVID-19 Vaccine 12/07/2019  8:57 AM 0.3 mL 11/03/2019 Intramuscular   Manufacturer: [place]   Lot: [id number]   [place]: [id number]

## 2019-12-14 ENCOUNTER — Telehealth: Payer: Self-pay | Admitting: Medical Oncology

## 2019-12-14 NOTE — Telephone Encounter (Signed)
Patient called asking when he will get gold markers placed. I informed him  He received his ADT 12/28 and he will not start radiation for 8 weeks after injection. Enid Derry will arrange the appointment within the next couple of weeks and call him with date and time. He voiced understanding.

## 2019-12-22 ENCOUNTER — Telehealth (HOSPITAL_COMMUNITY): Payer: Self-pay

## 2019-12-22 ENCOUNTER — Other Ambulatory Visit: Payer: Self-pay

## 2019-12-22 DIAGNOSIS — I6522 Occlusion and stenosis of left carotid artery: Secondary | ICD-10-CM

## 2019-12-22 NOTE — Telephone Encounter (Signed)
The above patient or their representative was contacted and gave the following answers to these questions:         Do you have any of the following symptoms?    NO  Fever                    Cough                   Shortness of breath  Do  you have any of the following other symptoms?    muscle pain         vomiting,        diarrhea        rash         weakness        red eye        abdominal pain         bruising          bruising or bleeding              joint pain           severe headache    Have you been in contact with someone who was or has been sick in the past 2 weeks?  NO  Yes                 Unsure                         Unable to assess   Does the person that you were in contact with have any of the following symptoms?   Cough         shortness of breath           muscle pain         vomiting,            diarrhea            rash            weakness           fever            red eye           abdominal pain           bruising  or  bleeding                joint pain                severe headache                 COMMENTS OR ACTION PLAN FOR THIS PATIENT:        ALL QUESTIONS WERE ANSWERED/[facility] PT HAS HAD 1ST COVID VACCINE

## 2019-12-25 ENCOUNTER — Ambulatory Visit (INDEPENDENT_AMBULATORY_CARE_PROVIDER_SITE_OTHER): Payer: Medicare Other | Admitting: Surgery

## 2019-12-25 ENCOUNTER — Other Ambulatory Visit: Payer: Self-pay

## 2019-12-25 ENCOUNTER — Ambulatory Visit (HOSPITAL_COMMUNITY)
Admission: RE | Admit: 2019-12-25 | Discharge: 2019-12-25 | Disposition: A | Discharge status: Home / Self Care | Payer: Medicare Other | Source: Ambulatory Visit | Attending: Surgery | Admitting: Surgery

## 2019-12-25 ENCOUNTER — Encounter: Payer: Self-pay | Admitting: Surgery

## 2019-12-25 VITALS — BP 142/74 | HR 59 | Temp 97.7°F | Resp 20 | Ht 75.0 in | Wt 244.0 lb

## 2019-12-25 DIAGNOSIS — I6522 Occlusion and stenosis of left carotid artery: Secondary | ICD-10-CM | POA: Insufficient documentation

## 2019-12-25 DIAGNOSIS — I6523 Occlusion and stenosis of bilateral carotid arteries: Secondary | ICD-10-CM | POA: Diagnosis not present

## 2019-12-25 NOTE — Progress Notes (Signed)
Vascular and Vein Specialist of [facility]  Patient name: Charles Robinson MRN: [id number] DOB: December 14, 1945 Sex: male   REASON FOR VISIT:    Follow up  [place] ILLNESS:   Charles Robinson a 74 y.o.malewho returns today for follow-up. He is status post left carotid endarterectomy on 02/27/2016. This was done for asymptomatic stenosis. The lesion and plaque extended up to the skull base. There is also a significant inflammatory reaction around the internal carotid artery. I could not place a shunt because of how distal the lesion was. He tolerated his operation without any significant difficulties.  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  He denies any neurologic issues.  He has not had any episodes of numbness or weakness or slurred speech.  He states that he has some swallowing difficulties however these have been persistent.  He has been diagnosed with prostate cancer and is to have radiation in the near future  He has a history of CAD, s/p PCI on lifelong Plavix.  He has a history of AFIB, on Amiodarone and Eliquis.  He is medically managed for hypertension.  He takes a statin for hypercholesterolemia.    PAST MEDICAL HISTORY:   Past Medical History:  Diagnosis Date  . Arthritis    back  . BPH (benign prostatic hypertrophy)   . Bruit 08/01/2009   doppler - no suggestion of significant diameter reduction, dissection or aneurysmal dilation.  normal evaluation  . CAD S/P multivessel PCI: LAD, RCA and extensive LCX-OM1 03/15/2012   s/p PCI to LAD, to RCA (now occluded), then extensive PCI to Dominant LCx-OM1  (enitre prox-AVG Circ for dissection in 07/2011)  . Cancer ([place])    SKIN CA  . GERD (gastroesophageal reflux disease)   . History of colon polyps   . History of echocardiogram 08/16/2011   Echo - EF 60-65%; moderate LV concentric  hypertrophy; abdnormal LV relaxation (grade 1 diastolic dysfunction; ascending aorta mildly dilated; LA moderately dilated;   . Hyperlipidemia   . Hypertension   . PAF (paroxysmal atrial fibrillation) ([place]) 12/20/2012   followed by Dr. Glenetta Hew; s/p DCCV 2011, 07/2011 post PCI with Type 4a MI; CHA2DS2Vasc = 3, on Warfarin  . Paroxysmal atrial flutter ([place]) 08/20/2011   TEE- atrial septum - no defect identified; RA normal in size, no evidence of thrombus; ascending aorta normal  . Prostate cancer ([place])   . Rupture quadriceps tendon      FAMILY HISTORY:   Family History  Problem Relation Age of Onset  . Leukemia Mother   . Heart disease Father   . Bladder Cancer Sister   . Prostate cancer Brother   . Breast cancer Maternal Grandmother     SOCIAL HISTORY:   Social History   Tobacco Use  . Smoking status: Never Smoker  . Smokeless tobacco: Never Used  Substance Use Topics  . Alcohol use: Yes    Alcohol/week: 2.0 standard drinks    Types: 2 Cans of beer per week    Comment: rare     ALLERGIES:   Allergies  Allergen Reactions  . Lipitor [Atorvastatin] Other (See Comments)    Increased HR is currently tolerating --   . Morphine And Related Nausea And Vomiting  . Vicodin [Hydrocodone-Acetaminophen] Other (See Comments)    Feel crazy     CURRENT MEDICATIONS:   Current Outpatient Medications  Medication Sig Dispense Refill  . amiodarone (PACERONE) 200 MG tablet TAKE 1 TABLET BY MOUTH EVERY DAY 90 tablet 2  . clopidogrel (PLAVIX) 75 MG tablet TAKE 1 TABLET BY MOUTH EVERY DAY 90 tablet 2  . Coenzyme Q10 200 MG capsule Take 200 mg by mouth every morning.     [name] doxazosin (CARDURA) 8 MG tablet Take 4 mg by mouth every evening.     [name] ELIQUIS 5 MG TABS tablet TAKE 1 TABLET BY MOUTH TWICE A DAY 180 tablet 2  . ezetimibe (ZETIA) 10 MG tablet TAKE 1 TABLET BY MOUTH EVERY DAY 90 tablet 2  . finasteride (PROSCAR) 5 MG tablet Take 5 mg by mouth every evening.     .  metoprolol succinate (TOPROL-XL) 25 MG 24 hr tablet TAKE 1 TABLET BY MOUTH EVERYDAY AT BEDTIME 90 tablet 2  . Multiple Vitamin (MULTIVITAMIN WITH MINERALS) TABS tablet Take 1 tablet by mouth every morning.     . Omega-3 Fatty Acids (FISH OIL) 1000 MG CAPS Take 1,000 capsules by mouth 2 (two) times daily.     . rosuvastatin (CRESTOR) 40 MG tablet TAKE 1 TABLET BY MOUTH EVERY DAY 90 tablet 1  . nitroGLYCERIN (NITROSTAT) 0.4 MG SL tablet PLACE 1 TABLET (0.4 MG TOTAL) UNDER THE TONGUE EVERY 5 (FIVE) MINUTES AS NEEDED. FOR CHEST PAIN (Patient not taking: Reported on 11/03/2019) 25 tablet 4   No current facility-administered medications for this visit.    REVIEW OF SYSTEMS:   [X]  denotes positive finding, [ ]  denotes negative finding Cardiac  Comments:  Chest pain or chest pressure:    Shortness of breath upon exertion:    Short of breath when lying flat:    Irregular heart rhythm:        Vascular    Pain in calf, thigh, or hip brought on by ambulation:    Pain in feet at night that wakes you up from your sleep:     Blood clot in your veins:    Leg swelling:         Pulmonary    Oxygen at home:    Productive cough:     Wheezing:         Neurologic    Sudden weakness in arms or legs:     Sudden numbness in arms or legs:     Sudden onset of difficulty speaking or slurred speech:    Temporary loss of vision in one eye:     Problems with dizziness:         Gastrointestinal    Blood in stool:     Vomited blood:         Genitourinary    Burning when urinating:     Blood in urine:        Psychiatric    Major depression:         Hematologic    Bleeding problems:    Problems with blood clotting too easily:        Skin    Rashes or ulcers:        Constitutional    Fever or chills:      PHYSICAL EXAM:   Vitals:   12/25/19 0828 12/25/19 0831  BP: (!) 141/74 (!) 142/74  Pulse: (!) 59   Resp: 20   Temp: 97.7 F (36.5 C)   SpO2: 100%   Weight: 244 lb (110.7 kg)    Height: 6\' 3"  (1.905 m)     GENERAL: The patient is  a well-nourished male, in no acute distress. The vital signs are documented above. CARDIAC: There is a regular rate and rhythm.  VASCULAR: carotid incision is well healed PULMONARY: Non-labored respirations MUSCULOSKELETAL: There are no major deformities or cyanosis. NEUROLOGIC: No focal weakness or paresthesias are detected. SKIN: There are no ulcers or rashes noted. PSYCHIATRIC: The patient has a normal affect.  STUDIES:   I have reviewed the following duplex results: Right Carotid: Velocities in the right ICA are consistent with a 1-39%  stenosis.   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   Vertebrals: Bilateral vertebral arteries demonstrate antegrade flow.  Subclavians: Normal flow hemodynamics were seen in bilateral subclavian        arteries.   MEDICAL ISSUES:   Carotid stenosis: The patient had a silent occlusion of his left carotid endarterectomy likely secondary to the severe amount of inflammation around his extensive plaque.  Fortunately, he did not have any symptoms.  I am following the right side which shows no significant stenosis.  He is on optimal medical therapy.  We will continue with annual surveillance.    Leia Alf, MD, FACS Vascular and Vein Specialists of [facility] [phone] Pager [phone]

## 2019-12-26 ENCOUNTER — Other Ambulatory Visit: Payer: Self-pay | Admitting: *Deleted

## 2019-12-26 ENCOUNTER — Ambulatory Visit: Payer: Medicare Other | Attending: Internal Medicine

## 2019-12-26 DIAGNOSIS — I6523 Occlusion and stenosis of bilateral carotid arteries: Secondary | ICD-10-CM

## 2019-12-26 DIAGNOSIS — Z23 Encounter for immunization: Secondary | ICD-10-CM | POA: Insufficient documentation

## 2019-12-26 NOTE — Progress Notes (Signed)
   Covid-19 Vaccination Clinic  Name:  Charles Robinson    MRN: [id number] DOB: 25-Jan-1946  12/26/2019  [name] was observed post Covid-19 immunization for 15 minutes without incidence. He was provided with Vaccine Information Sheet and instruction to access the V-Safe system.   [name] was instructed to call 911 with any severe reactions post vaccine: [name] Difficulty breathing  . Swelling of your face and throat  . A fast heartbeat  . A bad rash all over your body  . Dizziness and weakness    Immunizations Administered    Name Date Dose VIS Date Route   Pfizer COVID-19 Vaccine 12/26/2019  8:18 AM 0.3 mL 11/03/2019 Intramuscular   Manufacturer: [place]   Lot: [id number]   [place]: [address]-6

## 2019-12-28 ENCOUNTER — Telehealth: Payer: Self-pay | Admitting: *Deleted

## 2019-12-28 NOTE — Telephone Encounter (Signed)
Returned patient's phone call, spoke with patient 

## 2019-12-29 ENCOUNTER — Ambulatory Visit (INDEPENDENT_AMBULATORY_CARE_PROVIDER_SITE_OTHER): Payer: Medicare Other | Admitting: Gastroenterology

## 2019-12-29 ENCOUNTER — Other Ambulatory Visit: Payer: Self-pay

## 2019-12-29 ENCOUNTER — Encounter: Payer: Self-pay | Admitting: Gastroenterology

## 2019-12-29 VITALS — BP 128/84 | HR 48 | Temp 97.8°F | Ht 75.0 in | Wt 247.3 lb

## 2019-12-29 DIAGNOSIS — Z1211 Encounter for screening for malignant neoplasm of colon: Secondary | ICD-10-CM

## 2019-12-29 DIAGNOSIS — Z7901 Long term (current) use of anticoagulants: Secondary | ICD-10-CM | POA: Diagnosis not present

## 2019-12-29 DIAGNOSIS — I6523 Occlusion and stenosis of bilateral carotid arteries: Secondary | ICD-10-CM

## 2019-12-29 DIAGNOSIS — Z7902 Long term (current) use of antithrombotics/antiplatelets: Secondary | ICD-10-CM | POA: Diagnosis not present

## 2019-12-29 NOTE — Progress Notes (Signed)
12/29/2019 Charles Robinson [id number] 1945/12/16   HISTORY OF PRESENT ILLNESS: This is a pleasant 74 year old male who is previously known to Dr. Olevia Perches.  His last colonoscopy was in 2011 at which time he was found to have 2 hyperplastic polyps that were removed.  Also had diverticulosis.  He is here today to discuss screening colonoscopy.  He does have multiple medical problems including coronary artery disease and carotid artery disease for which he is on Plavix.  Also has history of atrial fibrillation for which he is on Eliquis.  Recent diagnosis of prostate cancer and will be starting radiation therapy in the near future.  Denies any GI complaints including rectal bleeding.  Moves his bowels well.  No FH of colon cancer.   Past Medical History:  Diagnosis Date  . Arthritis    back  . BPH (benign prostatic hypertrophy)   . Bruit 08/01/2009   doppler - no suggestion of significant diameter reduction, dissection or aneurysmal dilation.  normal evaluation  . CAD S/P multivessel PCI: LAD, RCA and extensive LCX-OM1 03/15/2012   s/p PCI to LAD, to RCA (now occluded), then extensive PCI to Dominant LCx-OM1  (enitre prox-AVG Circ for dissection in 07/2011)  . Cancer ([place])    SKIN CA  . GERD (gastroesophageal reflux disease)   . History of colon polyps   . History of echocardiogram 08/16/2011   Echo - EF 60-65%; moderate LV concentric hypertrophy; abdnormal LV relaxation (grade 1 diastolic dysfunction; ascending aorta mildly dilated; LA moderately dilated;   . Hyperlipidemia   . Hypertension   . PAF (paroxysmal atrial fibrillation) ([place]) 12/20/2012   followed by Dr. Glenetta Hew; s/p DCCV 2011, 07/2011 post PCI with Type 4a MI; CHA2DS2Vasc = 3, on Warfarin  . Paroxysmal atrial flutter ([place]) 08/20/2011   TEE- atrial septum - no defect identified; RA normal in size, no evidence of thrombus; ascending aorta normal  . Prostate cancer ([place])   . Rupture quadriceps tendon    Past Surgical  History:  Procedure Laterality Date  . BACK SURGERY    . COLONOSCOPY    . CORONARY ANGIOPLASTY WITH STENT PLACEMENT  03/15/2012   multiple stens in AVGroove Circ & OM1;; 2 site PTCA w/ PTCA and stenting of OM2 ang PTCA of distal stent ISR followed by PTCA of mid LCx ISR  . CORONARY STENT PLACEMENT  2003 - 2011 - 2012 - 2013   Pre-2012 - BMS in RCA now known occluded, BMS in proximal LAD; 07/2011 - 2.25 x 23 BMS in OM 1 with significant disease on either side noted shortly after -->  2 additional overlapping DES 2.5 mm x 30 mm and 2.25 mm x 26 mm in OM1,; 3 overlapping Resolute DES in AV groove Cx crossing OM1: (Tapered from 3.8-2.6 mm)- Resolute DES 3.5 x 22, 3.0 x 38, 2.5 x 14  . DOPPLER ECHOCARDIOGRAPHY  07/2011   EF ~60-65%, Grade 1 D Dysfxn; mod Conc LVH  . ENDARTERECTOMY Left 02/27/2016   Procedure: LEFT  CAROTID ARTERY ENDARTERECTOMY ;  Surgeon: Serafina Mitchell, MD;  Location: [facility];  Service: Vascular;  Laterality: Left;  . LEFT HEART CATHETERIZATION WITH CORONARY ANGIOGRAM N/A 03/15/2012   Procedure: LEFT HEART CATHETERIZATION WITH CORONARY ANGIOGRAM;  Surgeon: [name], MD;  [facility] CATH LAB: patent LAD stents, patent LCx stents w/80% focal ISR just distal to OM; OM proximal stent open; -- 2 site PTCA-PCI  . LUMBAR LAMINECTOMY/DECOMPRESSION MICRODISCECTOMY Bilateral 12/11/2014   Procedure: Bilateral  Lumbar Three-Four Laminectomy;  Surgeon: Kristeen Miss, MD;  Location: [facility]URO [facility]S;  Service: Neurosurgery;  Laterality: Bilateral;  bilateral  . NM MYOVIEW LTD  11/2012   No ischemia or infarction, normal EF & WM  . PATCH ANGIOPLASTY Left 02/27/2016   Procedure: WITH HEMASHIELD DACRON  PATCH ANGIOPLASTY;  Surgeon: Serafina Mitchell, MD;  Location: [facility];  Service: Vascular;  Laterality: Left;  [name] QUADRICEPS TENDON REPAIR  09/19/2012   Procedure: REPAIR QUADRICEP TENDON;  Surgeon: [name], MD;  Location: [facility];  Service: Orthopedics;  Laterality: Left;  [name] VASECTOMY      reports  that he has never smoked. He has never used smokeless tobacco. He reports current alcohol use of about 2.0 standard drinks of alcohol per week. He reports that he does not use drugs. family history includes Bladder Cancer in his sister; Breast cancer in his maternal grandmother; Heart disease in his father; Leukemia in his mother; Prostate cancer in his brother. Allergies  Allergen Reactions  . Lipitor [Atorvastatin] Other (See Comments)    Increased HR is currently tolerating --   . Morphine And Related Nausea And Vomiting  . Vicodin [Hydrocodone-Acetaminophen] Other (See Comments)    Feel crazy      Outpatient Encounter Medications as of 12/29/2019  Medication Sig  . amiodarone (PACERONE) 200 MG tablet TAKE 1 TABLET BY MOUTH EVERY DAY  . clopidogrel (PLAVIX) 75 MG tablet TAKE 1 TABLET BY MOUTH EVERY DAY  . Coenzyme Q10 200 MG capsule Take 200 mg by mouth every morning.   [name] doxazosin (CARDURA) 8 MG tablet Take 4 mg by mouth every evening.   [name] ELIQUIS 5 MG TABS tablet TAKE 1 TABLET BY MOUTH TWICE A DAY  . ezetimibe (ZETIA) 10 MG tablet TAKE 1 TABLET BY MOUTH EVERY DAY  . finasteride (PROSCAR) 5 MG tablet Take 5 mg by mouth every evening.   . metoprolol succinate (TOPROL-XL) 25 MG 24 hr tablet TAKE 1 TABLET BY MOUTH EVERYDAY AT BEDTIME  . Multiple Vitamin (MULTIVITAMIN WITH MINERALS) TABS tablet Take 1 tablet by mouth every morning.   . nitroGLYCERIN (NITROSTAT) 0.4 MG SL tablet PLACE 1 TABLET (0.4 MG TOTAL) UNDER THE TONGUE EVERY 5 (FIVE) MINUTES AS NEEDED. FOR CHEST PAIN  . Omega-3 Fatty Acids (FISH OIL) 1000 MG CAPS Take 1,000 capsules by mouth 2 (two) times daily.   . rosuvastatin (CRESTOR) 40 MG tablet TAKE 1 TABLET BY MOUTH EVERY DAY   No facility-administered encounter medications on file as of 12/29/2019.     REVIEW OF SYSTEMS  : All other systems reviewed and negative except where noted in the History of Present Illness.   PHYSICAL EXAM: BP 128/84 (BP Location: Left Arm,  Patient Position: Sitting, Cuff Size: Normal)   Pulse (!) 48   Temp 97.8 F (36.6 C) (Oral)   Ht 6\' 3"  (1.905 m)   Wt 247 lb 5 oz (112.2 kg)   BMI 30.91 kg/m  General: Well developed white male in no acute distress Head: Normocephalic and atraumatic Eyes:  Sclerae anicteric, conjunctiva pink. Ears: Normal auditory acuity Lungs: Clear throughout to auscultation; no W/R/R. Heart: Bradycardic, no M/R/G noted. Abdomen: Soft, non-distended.  BS present.  Non-tender. Musculoskeletal: Symmetrical with no gross deformities  Skin: No lesions on visible extremities Extremities: No edema  Neurological: Alert oriented x 4, grossly non-focal Psychological:  Alert and cooperative. Normal mood and affect  ASSESSMENT AND PLAN: *CRC screening: Last colonoscopy was 10 years ago with only 2 small  hyperplastic polyps.  No GI complaints.  No FH colon cancer.  With the patient's history of coronary artery disease, carotid artery disease, and atrial fibrillation with both Plavix and Eliquis use I explained to him that he is considered high risk for colonoscopy.  I explained to him that certainly at age 27 we could make the decision to do no further colorectal cancer screening.  We also discussed the option of Cologuard as well, if negative then no colonoscopy necessary, but if positive then would need to at least potentially consider colonoscopy, which would require holding both Plavix and Eliquis.  Patient decided he would like to proceed with Cologuard study.  CC:  [name], MD

## 2019-12-29 NOTE — Patient Instructions (Addendum)
Your provider has ordered Cologuard testing as an option for colon cancer screening. This is performed by [employer] and may be out of network with your insurance. PRIOR to completing the test, it is YOUR responsibility to contact your insurance about covered benefits for this test. Your out of pocket expense could be anywhere from $0.00 to $649.00.   When you call to check coverage with your insurer, please provide the following information:   -The ONLY provider of Cologuard is [place] code for Cologuard is [phone].  [employer] NPI # [id number]  -[employer] Tax ID # [id number]   We have already sent your demographic and insurance information to [employer] (phone number [phone]) and they should contact you within the next week regarding your test. If you have not heard from them within the next week, please call our office at [phone].    If you are age 23 or older, your body mass index should be between 23-30. Your Body mass index is 30.91 kg/m. If this is out of the aforementioned range listed, please consider follow up with your Primary Care Provider.  If you are age 59 or younger, your body mass index should be between 19-25. Your Body mass index is 30.91 kg/m. If this is out of the aformentioned range listed, please consider follow up with your Primary Care Provider.   Thank you for choosing me and [facility].  Janett Billow Zehr-PA

## 2019-12-29 NOTE — Progress Notes (Signed)
Reviewed and agree with management plan.  Emmylou Bieker T. Kanishk Stroebel, MD FACG [facility]  

## 2020-01-11 ENCOUNTER — Other Ambulatory Visit: Payer: Self-pay | Admitting: Urology

## 2020-01-11 DIAGNOSIS — C61 Malignant neoplasm of prostate: Secondary | ICD-10-CM

## 2020-01-12 ENCOUNTER — Other Ambulatory Visit: Payer: Self-pay

## 2020-01-12 LAB — COLOGUARD: Cologuard: POSITIVE — AB

## 2020-01-15 ENCOUNTER — Encounter: Payer: Self-pay | Admitting: Medical Oncology

## 2020-01-16 ENCOUNTER — Telehealth: Payer: Self-pay

## 2020-01-16 ENCOUNTER — Telehealth: Payer: Self-pay | Admitting: Medical Oncology

## 2020-01-16 ENCOUNTER — Telehealth: Payer: Self-pay | Admitting: *Deleted

## 2020-01-16 NOTE — Telephone Encounter (Signed)
The pt has been advised that when he has procedures scheduled he can hold eliquis and plavix.

## 2020-01-16 NOTE — Telephone Encounter (Signed)
CALLED PATIENT TO INFORM OF FID. MARKERS AND SPACE OAR GEL PLACEMENT ON 02-08-20 - ARRIVAL TIME- 10:45 AM @ [facility] AND HIS SIM ON 02-16-20 - ARRIVAL TIME- 12:45 PM @ [place], SPOKE WITH PATIENT AND HE IS AWARE OF THESE APPTS.

## 2020-01-16 NOTE — Telephone Encounter (Signed)
-----   Message from [name], MD sent at 01/16/2020 12:08 AM EST -----   ----- Message ----- From: Timothy Lasso, RN Sent: 01/12/2020  11:51 AM EST To: [name], MD

## 2020-01-16 NOTE — Progress Notes (Signed)
It is okay for him to be off Plavix and Eliquis.  Plavix will be held 5 days preprocedure and Eliquis 2 to 3 days preprocedure.  Please restart once safe postop.  Glenetta Hew, MD

## 2020-01-16 NOTE — Telephone Encounter (Signed)
Author: [name], MD Service: Cardiology Author Type: Physician  Filed: 01/16/2020 12:09 AM Encounter Date: 01/12/2020 Status: Signed  Editor: [name], MD (Physician)     Show:Clear all [x] Manual[x] Template[] Copied  Added by: [x] [name], MD  [] Hover for details It is okay for him to be off Plavix and Eliquis.  Plavix will be held 5 days preprocedure and Eliquis 2 to 3 days preprocedure.  Please restart once safe postop.  Glenetta Hew, MD

## 2020-01-17 ENCOUNTER — Other Ambulatory Visit: Payer: Self-pay | Admitting: Cardiology

## 2020-01-22 DIAGNOSIS — I350 Nonrheumatic aortic (valve) stenosis: Secondary | ICD-10-CM

## 2020-01-22 HISTORY — DX: Nonrheumatic aortic (valve) stenosis: I35.0

## 2020-01-22 HISTORY — PX: TRANSTHORACIC ECHOCARDIOGRAM: SHX275

## 2020-01-24 ENCOUNTER — Ambulatory Visit (INDEPENDENT_AMBULATORY_CARE_PROVIDER_SITE_OTHER): Payer: Medicare Other | Admitting: Cardiology

## 2020-01-24 ENCOUNTER — Encounter: Payer: Self-pay | Admitting: Cardiology

## 2020-01-24 ENCOUNTER — Other Ambulatory Visit: Payer: Self-pay

## 2020-01-24 VITALS — BP 164/72 | HR 46 | Ht 75.0 in | Wt 243.8 lb

## 2020-01-24 DIAGNOSIS — I2510 Atherosclerotic heart disease of native coronary artery without angina pectoris: Secondary | ICD-10-CM | POA: Diagnosis not present

## 2020-01-24 DIAGNOSIS — I10 Essential (primary) hypertension: Secondary | ICD-10-CM | POA: Diagnosis not present

## 2020-01-24 DIAGNOSIS — I480 Paroxysmal atrial fibrillation: Secondary | ICD-10-CM

## 2020-01-24 DIAGNOSIS — I358 Other nonrheumatic aortic valve disorders: Secondary | ICD-10-CM

## 2020-01-24 DIAGNOSIS — Z9861 Coronary angioplasty status: Secondary | ICD-10-CM | POA: Diagnosis not present

## 2020-01-24 DIAGNOSIS — I25119 Atherosclerotic heart disease of native coronary artery with unspecified angina pectoris: Secondary | ICD-10-CM | POA: Diagnosis not present

## 2020-01-24 DIAGNOSIS — I6523 Occlusion and stenosis of bilateral carotid arteries: Secondary | ICD-10-CM | POA: Diagnosis not present

## 2020-01-24 DIAGNOSIS — E669 Obesity, unspecified: Secondary | ICD-10-CM

## 2020-01-24 DIAGNOSIS — Z7902 Long term (current) use of antithrombotics/antiplatelets: Secondary | ICD-10-CM

## 2020-01-24 DIAGNOSIS — E785 Hyperlipidemia, unspecified: Secondary | ICD-10-CM | POA: Diagnosis not present

## 2020-01-24 DIAGNOSIS — N1831 Chronic kidney disease, stage 3a: Secondary | ICD-10-CM

## 2020-01-24 DIAGNOSIS — Z01818 Encounter for other preprocedural examination: Secondary | ICD-10-CM | POA: Diagnosis not present

## 2020-01-24 DIAGNOSIS — I6522 Occlusion and stenosis of left carotid artery: Secondary | ICD-10-CM

## 2020-01-24 DIAGNOSIS — Z79899 Other long term (current) drug therapy: Secondary | ICD-10-CM

## 2020-01-24 MED ORDER — LISINOPRIL 5 MG PO TABS: 5.0000 mg | ORAL_TABLET | Freq: Every day | ORAL | 3 refills | Status: DC

## 2020-01-24 NOTE — Progress Notes (Signed)
Primary Care Provider: [name], MD Cardiologist: Glenetta Hew, MD Electrophysiologist: None  Clinic Note: Chief Complaint  Patient presents with  . Follow-up    Doing well.  Has questions about medications with upcoming prostate cancer treatments  . Coronary Artery Disease  . Atrial Fibrillation    On amiodarone.  Doing well.    HPI:    Charles Robinson -"Sonny" is a 74 y.o. male with a longstanding history of CAD dating back to thousand 4 (most notable for significant complication during PCI of OM leaving to dissection of OM and circumflex-extensive PCI to both), PAF (on amiodarone with Eliquis.) along with carotid artery disease (s/p CEA -[name]) below who presents today for 69-monthfollow-up.  SIsabell Jarvisis a former patient of Dr. AAldona Barwho I first met in the Cath lab in Sept 2012 - -he has a long h/o CAD:  2004 ([name] - PCI LAD & RCA  03/2011: routine Myoview -- Inferior Ischemia --> ([name] Cath: small RCA 100% CTO @ stent (L-R collaterals). LAD- prox stent patent, D1 ost 50%. OM1 subtotal TO just proximal to bifurcation--> bifurcation PCI/PTCA - 2.25 x 23 (2.5 mm) MinVision BMS into inferior branch.  Recurrent Angina - Abnormal Nuc ST (07/2011)-- PCI of OM1 ISR /thrombosis (recent OM1 PCI by Dr. LRex Kraswith  complicated by Guideliner related dissection involving a large portion of the Cx & OM1 -- essentially "full metal jacket" in Cx from prox to distal bifurcation & down the OM1 up to the previous stent.   ? Hospitalization complicated by Type 4a MI & Afib RVR -- DCCV.  03/15/2012: Abnormal Nuc ST again for CP --> PTCA of OM1 ostium & overlapping DES in OM (uncovered site) with BMS ISR PTCA & mCX PTCA.  Follow-up Myoview January 2014: No ischemia or infarction.  S/p L CEA - 2019 --> now with CTO of LICA on Carotid Dopplers 2021   [place] (was working at [employer] doing yoga, treadmill and exercises.  Also going  fishing.  No angina, CHF or suggestion of A. fib symptoms).    He was seen by [name] PA in September 2020 for pr me eop evaluation for prostate biopsy.  Cleared to hold Plavix and Eliquis.  He had stopped his lisinopril due to low blood pressures at home.  Felt better off of lisinopril.  He has recently been diagnosed with prostate cancer and is pending seed implant and radiation.  Recent Hospitalizations: None  Reviewed  CV studies:    The following studies were reviewed today: (if available, images/films reviewed: From Epic Chart or Care Everywhere) . Carotid Dopplers February 2021: Left ICA now CTO.  Less than 40% right ICA.  Normal bilateral vertebral arteries and subclavian arteries.   Interval History:   LDRAYSON DORKO"SIsabell Robinson returns here today for cardiology follow-up overall doing pretty well from a cardiac standpoint.  He said he still walks about 2 miles a day and denies any chest pain or dyspnea.  He does not walk very fast, but has not really gone down that much.  He says his heart rate is usually running a little on the low side, but denies any racing or irregular heartbeats/palpitations.  He does not have any syncope or near syncope with it no fatigue associated with it.  Just says that he takes a little time to get himself revved up, but he does not really seem to think that  it limits what is able to do.  CV Review of Symptoms (Summary): no chest pain or dyspnea on exertion positive for - Low resting heart rate with some mild fatigue negative for - edema, irregular heartbeat, orthopnea, palpitations, paroxysmal nocturnal dyspnea, rapid heart rate, shortness of breath or Syncope/near syncope, TIA/amaurosis fugax, claudication.  The patient does not have symptoms concerning for COVID-19 infection (fever, chills, cough, or new shortness of breath).  The patient is practicing social distancing & Masking.  He has had his 2 Covid vaccine injections.   REVIEWED OF SYSTEMS    Review of Systems  Constitutional: Positive for weight loss. Negative for malaise/fatigue.  Gastrointestinal: Negative for blood in stool and melena.  Genitourinary: Negative for hematuria.  Musculoskeletal: Positive for joint pain (Normal arthritis pains).  Neurological: Negative for dizziness, sensory change, speech change, focal weakness and headaches.  Psychiatric/Behavioral: Positive for memory loss (A little bit). Negative for depression. The patient is not nervous/anxious and does not have insomnia.    I have reviewed and (if needed) personally updated the patient's problem list, medications, allergies, past medical and surgical history, social and family history.   PAST MEDICAL HISTORY   Past Medical History:  Diagnosis Date  . Arthritis    back  . BPH (benign prostatic hypertrophy)   . CAD S/P multivessel PCI: LAD, RCA and extensive LCX-OM1 03/15/2012   s/p PCI to LAD, to RCA (now occluded), then extensive PCI to Dominant LCx-OM1  (enitre prox-AVG Circ for dissection in 07/2011)  . Cancer ([place])    SKIN CA  . GERD (gastroesophageal reflux disease)   . History of colon polyps   . History of echocardiogram 08/16/2011   Echo - EF 60-65%; moderate LV concentric hypertrophy; abdnormal LV relaxation (grade 1 diastolic dysfunction; ascending aorta mildly dilated; LA moderately dilated;   . Hyperlipidemia   . Hypertension   . 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 PAF (paroxysmal atrial fibrillation) ([place]) 12/20/2012   followed by Dr. Glenetta Hew; s/p DCCV 2011, 07/2011 post PCI with Type 4a MI; CHA2DS2Vasc = 3, on Warfarin  . Paroxysmal atrial flutter ([place]) 08/20/2011   TEE- atrial septum - no defect identified; RA normal in size, no evidence of thrombus; ascending aorta normal  . Prostate cancer ([place])   . Rupture quadriceps tendon     PAST SURGICAL HISTORY   Past Surgical History:   Procedure Laterality Date  . BACK SURGERY    . COLONOSCOPY    . CORONARY ANGIOPLASTY WITH STENT PLACEMENT  03/15/2012   multiple stens in AVGroove Circ & OM1;; 2 site PTCA w/ PTCA and stenting of OM2 ang PTCA of distal stent ISR followed by PTCA of mid LCx ISR  . CORONARY STENT PLACEMENT  2003 - 2011 - 2012 - 2013   Pre-2012 - BMS in RCA now known occluded, BMS in proximal LAD; 07/2011 - 2.25 x 23 BMS in OM 1 with significant disease on either side noted shortly after -->  2 additional overlapping DES 2.5 mm x 30 mm and 2.25 mm x 26 mm in OM1,; 3 overlapping Resolute DES in AV groove Cx crossing OM1: (Tapered from 3.8-2.6 mm)- Resolute DES 3.5 x 22, 3.0 x 38, 2.5 x 14  . DOPPLER ECHOCARDIOGRAPHY  07/2011   EF ~60-65%, Grade 1 D Dysfxn; mod Conc LVH  . ENDARTERECTOMY Left 02/27/2016   Procedure: LEFT  CAROTID ARTERY ENDARTERECTOMY ;  Surgeon: Serafina Mitchell,  MD;  Location: [place];  Service: Vascular;  Laterality: Left;  . LEFT HEART CATHETERIZATION WITH CORONARY ANGIOGRAM N/A 03/15/2012   Procedure: LEFT HEART CATHETERIZATION WITH CORONARY ANGIOGRAM;  Surgeon: [name], MD;  Location: [facility] CATH LAB: patent LAD stents, patent LCx stents w/80% focal ISR just distal to OM; OM proximal stent open; -- 2 site PTCA-PCI  . LUMBAR LAMINECTOMY/DECOMPRESSION MICRODISCECTOMY Bilateral 12/11/2014   Procedure: Bilateral Lumbar Three-Four Laminectomy;  Surgeon: Kristeen Miss, MD;  Location: [place] NEURO ORS;  Service: Neurosurgery;  Laterality: Bilateral;  bilateral  . NM MYOVIEW LTD  11/2012   No ischemia or infarction, normal EF & WM  . PATCH ANGIOPLASTY Left 02/27/2016   Procedure: WITH HEMASHIELD DACRON  PATCH ANGIOPLASTY;  Surgeon: Serafina Mitchell, MD;  Location: [place];  Service: Vascular;  Laterality: Left;  [name] QUADRICEPS TENDON REPAIR  09/19/2012   Procedure: REPAIR QUADRICEP TENDON;  Surgeon: [name], MD;  Location: [facility];  Service: Orthopedics;  Laterality: Left;  [name] VASECTOMY       MEDICATIONS/ALLERGIES   Current Meds  Medication Sig  . amiodarone (PACERONE) 200 MG tablet TAKE 1 TABLET BY MOUTH EVERY DAY  . clopidogrel (PLAVIX) 75 MG tablet TAKE 1 TABLET BY MOUTH EVERY DAY  . Coenzyme Q10 200 MG capsule Take 200 mg by mouth every morning.   [name] doxazosin (CARDURA) 8 MG tablet Take 4 mg by mouth every evening.   [name] ELIQUIS 5 MG TABS tablet TAKE 1 TABLET BY MOUTH TWICE A DAY  . ezetimibe (ZETIA) 10 MG tablet TAKE 1 TABLET BY MOUTH EVERY DAY  . finasteride (PROSCAR) 5 MG tablet Take 5 mg by mouth every evening.   [name] Leuprolide Acetate (ELIGARD [place]) Inject into the skin.  . Multiple Vitamin (MULTIVITAMIN WITH MINERALS) TABS tablet Take 1 tablet by mouth every morning.   . nitroGLYCERIN (NITROSTAT) 0.4 MG SL tablet PLACE 1 TABLET (0.4 MG TOTAL) UNDER THE TONGUE EVERY 5 (FIVE) MINUTES AS NEEDED. FOR CHEST PAIN  . Omega-3 Fatty Acids (FISH OIL) 1000 MG CAPS Take 1,000 capsules by mouth 2 (two) times daily.   . rosuvastatin (CRESTOR) 40 MG tablet TAKE 1 TABLET BY MOUTH EVERY DAY  . [DISCONTINUED] metoprolol succinate (TOPROL-XL) 25 MG 24 hr tablet TAKE 1 TABLET BY MOUTH EVERYDAY AT BEDTIME    Allergies  Allergen Reactions  . Lipitor [Atorvastatin] Other (See Comments)    Increased HR is currently tolerating --   . Morphine And Related Nausea And Vomiting  . Vicodin [Hydrocodone-Acetaminophen] Other (See Comments)    Feel crazy    SOCIAL HISTORY/FAMILY HISTORY   Reviewed in Epic:  Pertinent findings: -> No new changes  OBJCTIVE -PE, EKG, labs   Wt Readings from Last 3 Encounters:  01/24/20 243 lb 12.8 oz (110.6 kg)  12/29/19 247 lb 5 oz (112.2 kg)  12/25/19 244 lb (110.7 kg)  05/2018 - 263 lb  Physical Exam: BP (!) 164/72   Pulse (!) 46   Ht _0  (1.905 m)   Wt 243 lb 12.8 oz (110.6 kg)   BMI 30.47 kg/m  Physical Exam  Constitutional: He is oriented to person, place, and time. He appears well-developed and well-nourished. No distress.  He does seem  older than last time I saw him.  Little slower talking and walking.  Has lost significant weight.  HENT:  Head: Normocephalic and atraumatic.  Neck: No hepatojugular reflux and no JVD present. Carotid bruit is not present (No left-sided bruit heard).  Cardiovascular:  Regular rhythm, S1 normal and S2 normal.  No extrasystoles are present. Bradycardia present. PMI is not displaced. Exam reveals distant heart sounds and decreased pulses (Minimally diminished pedal pulses). Exam reveals no gallop and no friction rub.  Murmur (1-2/6 SEM at RUSB.) heard. Abdominal: Soft. Bowel sounds are normal. He exhibits no distension. There is no abdominal tenderness. There is no rebound.  Musculoskeletal:        General: Edema (Trivial) present. Normal range of motion.     Cervical back: Normal range of motion and neck supple.  Neurological: He is alert and oriented to person, place, and time.  Psychiatric: He has a normal mood and affect. His behavior is normal. Judgment normal.  He seems a little bit slower today.  Not as sharp with his speech.  A little more tremulous.  A little less sure of himself answering questions.     Adult ECG Report  Rate: 46 ;  Rhythm: sinus bradycardia and Otherwise normal axis, intervals and durations.;   Narrative Interpretation: Heart rate slower Otherwise stable  Recent Labs:    Lab Results  Component Value Date   CHOL 132 08/15/2019   HDL 46 08/15/2019   LDLCALC 71 08/15/2019   TRIG 78 08/15/2019   CHOLHDL 2.9 08/15/2019   Lab Results  Component Value Date   CREATININE 1.58 (H) 08/15/2019   BUN 27 08/15/2019   NA 140 08/15/2019   K 4.9 08/15/2019   CL 103 08/15/2019   CO2 25 08/15/2019   Lab Results  Component Value Date   TSH 2.720 01/24/2020    ASSESSMENT/PLAN    Problem List Items Addressed This Visit    CAD, s/p multiple PCIs - Primary (Chronic)    Distant history of LAD PCI but was patent during his last couple catheterizations.  Several  interventions between 2012 and 2013 involving the main circumflex and major OM branch.  Most recent PCI was at the bifurcation site and covering a segment of in-stent restenosis in the OM. No further angina since.  Myoview in January 2014 was negative for ischemia.  No evidence of infarction.  He is now in addition to Eliquis.  With the extensive amount of stent work, we have continued Plavix, but would be okay to hold for any procedures.  (Hold 5 to 7 days).  Eliquis can also be held.  As long as he is not having any bleeding issues, I would like to continue the Plavix.  If there is any concern of bleeding, I would simply just stop the Plavix.      Relevant Medications   lisinopril (ZESTRIL) 5 MG tablet   Other Relevant Orders   EKG 12-Lead (Completed)   Dyslipidemia, goal LDL below 70 (Chronic)    Zetia added by PCP as far as I can tell after last set of labs.  Should be due to have labs checked soon.  We will await the results.  (He is not fasting today, therefore we did not check)      Relevant Medications   lisinopril (ZESTRIL) 5 MG tablet   Essential hypertension (Chronic)    Lisinopril was stopped because of hypotension issues.  He tells me at home his blood pressure have been drifting up a little bit.  We are stopping his beta-blocker because of bradycardia, and will restart lisinopril 5 mg daily.      Relevant Medications   lisinopril (ZESTRIL) 5 MG tablet   PAF (paroxysmal atrial fibrillation) (HCC)  (Chronic)    Has  not had any further episodes of A. fib in the last several years since starting amiodarone.  For now, we will continue amiodarone, but can wean off beta-blocker. Continue Eliquis for anticoagulation.  Eliquis can be held 2 to 3 days prior to any procedures.  Standard amiodarone toxicity monitoring.      Relevant Medications   lisinopril (ZESTRIL) 5 MG tablet   Other Relevant Orders   EKG 12-Lead (Completed)   ECHOCARDIOGRAM COMPLETE   TSH (Completed)    Sedimentation rate (Completed)   C-reactive protein (Completed)   Coronary artery disease involving native coronary artery of native heart with angina pectoris ([place]) (Chronic)    No longer having any angina.  Last Myoview was in January 2014.  Stents seem to be doing fairly well. On statin and beta-blocker on hold amiodarone.  Also on Plavix which can be held for procedures.  Plan: Concern for bradycardia, will DC Toprol (amiodarone does have beta-blocker properties)  Without any further angina, no need for nitrate or calcium channel blocker.  However, if his blood pressure goes up off of Toprol, may need to consider adding either ARB or amlodipine type calcium channel blocker.      Relevant Medications   lisinopril (ZESTRIL) 5 MG tablet   Other Relevant Orders   EKG 12-Lead (Completed)   ECHOCARDIOGRAM COMPLETE   Obesity (BMI 30-39.9) (Chronic)    Notable weight loss.  Still exercising and doing very well.  I congratulated him on his efforts.      On amiodarone therapy (Chronic)    He should be due for PFTs to be checked in the fall, he has annual eye exams, we will check CRP and ESR as well as TSH for amiodarone toxicity monitoring.[name]  PCP is following LFTs and lipids.      Relevant Orders   Pulmonary Function Test   TSH (Completed)   Sedimentation rate (Completed)   C-reactive protein (Completed)   Antiplatelet or antithrombotic long-term use (Chronic)    On long-term Eliquis.  This is for A. fib and he is maintaining sinus rhythm on amiodarone.  Okay to hold Eliquis 2 to 3 days preop for prostate seed implant.  Also okay to hold Plavix 5-7 days preop      Chronic renal insufficiency, stage III (moderate) (HCC) (Chronic)    Creatinine is stable about 1.58.  I anticipate that it will go up a little bit being on ACE inhibitor, he should be having labs checked soon by his PCP.  If the creatinine goes up too far, we will simply convert to amlodipine.      Left carotid artery  stenosis (Chronic)   Relevant Medications   lisinopril (ZESTRIL) 5 MG tablet   Pre-operative clearance    Okay to hold Eliquis 2 to 3 days preop and Plavix 5 to 7 days preop for any procedures. -> No bridging required  With no active angina or heart failure symptoms, no further cardiac evaluation required.  We are checking 2D echocardiogram just simply to get a new baseline.      Relevant Orders   EKG 12-Lead (Completed)   ECHOCARDIOGRAM COMPLETE   Systolic murmur of aorta    Cannot member this is a new murmur for him or not.  Has not been evaluated in some time, especially with upcoming prostate cancer treatment, would like to get a new baseline assessment of his EF and also of his valve.  Plan: Check 2D echocardiogram.      Relevant Orders  ECHOCARDIOGRAM COMPLETE     This patients CHA2DS2-VASc Score and unadjusted Ischemic Stroke Rate (% per year) is equal to 4.8 % stroke rate/year from a score of 4  Above score calculated as 1 point each if present [CHF, HTN, DM, Vascular=MI/PAD/Aortic Plaque, Age if 65-74, or Male] Above score calculated as 2 points each if present [Age > 75, or Stroke/TIA/TE]   COVID-19 Education: The signs and symptoms of COVID-19 were discussed with the patient and how to seek care for testing (follow up with PCP or arrange E-visit).   The importance of social distancing was discussed today.  I spent a total of 20 minutes with the patient. >  50% of the time was spent in direct patient consultation.  Additional time spent with chart review  / charting (studies, outside notes, etc): 8 Total Time: 28 min   Current medicines are reviewed at length with the patient today.  (+/- concerns) n/a - .  A little bit concerned about slow heart rate.  Stopping beta-blocker now.   Patient Instructions / Medication Changes & Studies & Tests Ordered   Patient Instructions  Medication Instructions:  Stop taking Metoprolol  - wean -off  Take 1/2 tablet for 4  days then stopping  All together.  Start taking Lisinopril 5 mg one tablet daily - may start now   all other medication continue *If you need a refill on your cardiac medications before your next appointment, please call your pharmacy*   Lab Work: TODAY -sed rate  TSH  CRP If you have labs (blood work) drawn today and your tests are completely normal, you will receive your results only by: [name] MyChart Message (if you have MyChart) OR . A paper copy in the mail If you have any lab test that is abnormal or we need to change your treatment, we will call you to review the results.   Testing/Procedures: WILL BE SCHEDULE AT [place] 2 WEEKS FROM NOW Your physician has requested that you have an echocardiogram. Echocardiography is a painless test that uses sound waves to create images of your heart. It provides your doctor with information about the size and shape of your heart and how well your heart's chambers and valves are working. This procedure takes approximately one hour. There are no restrictions for this procedure.  AND WILL BE SCHEDUL AT [place] AUG 2021 Will need to have a covid test 3 days prior to testing -- will schedule  At [place]  ( the old [place] has recommended that you have a pulmonary function test. Pulmonary Function Tests are a group of tests that measure how well air moves in and out of your lungs.    Follow-Up: At [facility], you and your health needs are our priority.  As part of our continuing mission to provide you with exceptional heart care, we have created designated Provider Care Teams.  These Care Teams include your primary Cardiologist (physician) and Advanced Practice Providers (APPs -  Physician Assistants and Nurse Practitioners) who all work together to provide you with the care you need, when you need it.   Your next appointment:   6 month(s)- Sept 2021  The format  for your next appointment:   In Person  Provider:   Glenetta Hew, MD   Other Instructions Monitor your blood pressure  - the goal is 130-150/?   - if the number is more  than 150/? Most of the time please contact office     Studies Ordered:   Orders Placed This Encounter  Procedures  . TSH  . Sedimentation rate  . C-reactive protein  . EKG 12-Lead  . ECHOCARDIOGRAM COMPLETE  . Pulmonary Function Test     Glenetta Hew, M.D., M.S. Interventional Cardiologist   Pager # [phone] Phone # [phone] [address] [place], [place] [address]   Thank you for choosing Heartcare at [facility]!!

## 2020-01-24 NOTE — Patient Instructions (Addendum)
Medication Instructions:  Stop taking Metoprolol  - wean -off  Take 1/2 tablet for 4 days then stopping  All together.  Start taking Lisinopril 5 mg one tablet daily - may start now   all other medication continue *If you need a refill on your cardiac medications before your next appointment, please call your pharmacy*   Lab Work: TODAY -sed rate  TSH  CRP If you have labs (blood work) drawn today and your tests are completely normal, you will receive your results only by: [name] MyChart Message (if you have MyChart) OR . A paper copy in the mail If you have any lab test that is abnormal or we need to change your treatment, we will call you to review the results.   Testing/Procedures: WILL BE SCHEDULE AT [place] 2 WEEKS FROM NOW Your physician has requested that you have an echocardiogram. Echocardiography is a painless test that uses sound waves to create images of your heart. It provides your doctor with information about the size and shape of your heart and how well your heart's chambers and valves are working. This procedure takes approximately one hour. There are no restrictions for this procedure.  AND WILL BE SCHEDUL AT [place] AUG 2021 Will need to have a covid test 3 days prior to testing -- will schedule  At [place]  ( the old [place] has recommended that you have a pulmonary function test. Pulmonary Function Tests are a group of tests that measure how well air moves in and out of your lungs.    Follow-Up: At [facility], you and your health needs are our priority.  As part of our continuing mission to provide you with exceptional heart care, we have created designated Provider Care Teams.  These Care Teams include your primary Cardiologist (physician) and Advanced Practice Providers (APPs -  Physician Assistants and Nurse Practitioners) who all work together to provide you with the care you  need, when you need it.   Your next appointment:   6 month(s)- Sept 2021  The format for your next appointment:   In Person  Provider:   Glenetta Hew, MD   Other Instructions Monitor your blood pressure  - the goal is 130-150/?   - if the number is more than 150/? Most of the time please contact office

## 2020-01-25 LAB — C-REACTIVE PROTEIN: CRP: <1 mg/L (ref 0–10)

## 2020-01-25 LAB — TSH: TSH: 2.72 u[IU]/mL (ref 0.450–4.500)

## 2020-01-25 LAB — SEDIMENTATION RATE: Sed Rate: 5 mm/hr (ref 0–30)

## 2020-01-29 ENCOUNTER — Encounter: Payer: Self-pay | Admitting: Cardiology

## 2020-01-29 NOTE — Assessment & Plan Note (Signed)
Has not had any further episodes of A. fib in the last several years since starting amiodarone.  For now, we will continue amiodarone, but can wean off beta-blocker. Continue Eliquis for anticoagulation.  Eliquis can be held 2 to 3 days prior to any procedures.  Standard amiodarone toxicity monitoring.

## 2020-01-29 NOTE — Assessment & Plan Note (Signed)
He should be due for PFTs to be checked in the fall, he has annual eye exams, we will check CRP and ESR as well as TSH for amiodarone toxicity monitoring.[name]  PCP is following LFTs and lipids.

## 2020-01-29 NOTE — Assessment & Plan Note (Signed)
Creatinine is stable about 1.58.  I anticipate that it will go up a little bit being on ACE inhibitor, he should be having labs checked soon by his PCP.  If the creatinine goes up too far, we will simply convert to amlodipine.

## 2020-01-29 NOTE — Assessment & Plan Note (Addendum)
Distant history of LAD PCI but was patent during his last couple catheterizations.  Several interventions between 2012 and 2013 involving the main circumflex and major OM branch.  Most recent PCI was at the bifurcation site and covering a segment of in-stent restenosis in the OM. No further angina since.  Myoview in January 2014 was negative for ischemia.  No evidence of infarction.  He is now in addition to Eliquis.  With the extensive amount of stent work, we have continued Plavix, but would be okay to hold for any procedures.  (Hold 5 to 7 days).  Eliquis can also be held.  As long as he is not having any bleeding issues, I would like to continue the Plavix.  If there is any concern of bleeding, I would simply just stop the Plavix.

## 2020-01-29 NOTE — Assessment & Plan Note (Signed)
Lisinopril was stopped because of hypotension issues.  He tells me at home his blood pressure have been drifting up a little bit.  We are stopping his beta-blocker because of bradycardia, and will restart lisinopril 5 mg daily.

## 2020-01-29 NOTE — Assessment & Plan Note (Signed)
Notable weight loss.  Still exercising and doing very well.  I congratulated him on his efforts.

## 2020-01-29 NOTE — Assessment & Plan Note (Signed)
Zetia added by PCP as far as I can tell after last set of labs.  Should be due to have labs checked soon.  We will await the results.  (He is not fasting today, therefore we did not check)

## 2020-01-29 NOTE — Assessment & Plan Note (Signed)
On long-term Eliquis.  This is for A. fib and he is maintaining sinus rhythm on amiodarone.  Okay to hold Eliquis 2 to 3 days preop for prostate seed implant.  Also okay to hold Plavix 5-7 days preop

## 2020-01-29 NOTE — Assessment & Plan Note (Signed)
Cannot member this is a new murmur for him or not.  Has not been evaluated in some time, especially with upcoming prostate cancer treatment, would like to get a new baseline assessment of his EF and also of his valve.  Plan: Check 2D echocardiogram.

## 2020-01-29 NOTE — Assessment & Plan Note (Signed)
Okay to hold Eliquis 2 to 3 days preop and Plavix 5 to 7 days preop for any procedures. -> No bridging required  With no active angina or heart failure symptoms, no further cardiac evaluation required.  We are checking 2D echocardiogram just simply to get a new baseline.

## 2020-01-29 NOTE — Assessment & Plan Note (Signed)
No longer having any angina.  Last Myoview was in January 2014.  Stents seem to be doing fairly well. On statin and beta-blocker on hold amiodarone.  Also on Plavix which can be held for procedures.  Plan: Concern for bradycardia, will DC Toprol (amiodarone does have beta-blocker properties)  Without any further angina, no need for nitrate or calcium channel blocker.  However, if his blood pressure goes up off of Toprol, may need to consider adding either ARB or amlodipine type calcium channel blocker.

## 2020-01-29 NOTE — Assessment & Plan Note (Deleted)
On long-term Eliquis.  This is for A. fib and he is maintaining sinus rhythm on amiodarone.  Okay to hold Eliquis 2 to 3 days preop for prostate seed implant.

## 2020-01-31 ENCOUNTER — Telehealth: Payer: Self-pay | Admitting: Cardiology

## 2020-01-31 NOTE — Telephone Encounter (Signed)
   Primary Cardiologist: Glenetta Hew, MD  Chart reviewed as part of pre-operative protocol coverage. Given past medical history and time since last visit, based on ACC/AHA guidelines, LYAM PROVENCIO would be at acceptable risk for the planned procedure without further cardiovascular testing.   It is Ok to hold Eliquis for 2-3 days prior to procedure and can hold Plavix for 5-7 days prior to procedure. Resume these meds after procedure when OK with [name].   I will route this recommendation to the requesting party via Epic fax function and remove from pre-op pool.  Please call with questions.  Daune Perch, NP 01/31/2020, 2:29 PM

## 2020-01-31 NOTE — Telephone Encounter (Signed)
     [facility] Pre-operative Risk Assessment    Request for surgical clearance:  1. What type of surgery is being performed? Spaceoar Fiducial markers   2. When is this surgery scheduled? 02/08/20  3. What type of clearance is required (medical clearance vs. Pharmacy clearance to hold med vs. Both)? pharmacy  4. Are there any medications that need to be held prior to surgery and how long? Plavix stop 5 days prior, Eliquis 3 days prior  5. Practice name and name of physician performing surgery? [facility], Dr Junious Silk  6. What is your office phone number [phone] ext [phone]   7.   What is your office fax number [phone]  8.   Anesthesia type (None, local, MAC, general) ? local   [name] 01/31/2020, 10:17 AM  _________________________________________________________________   (provider comments below)

## 2020-01-31 NOTE — Telephone Encounter (Signed)
Patient with diagnosis of atrial fibrillation on Eliquis for anticoagulation.    Procedure: Spaceoar Fiducial Marker Date of procedure: 02/08/2020  CHADS2-VASc score of 6 (HTN, AGEx2, stroke/tia x 2, CAD)  CrCl 49 Platelet count 126  Per Dr. Allison Quarry most recent note 01/24/20, patient can hold Eliquis for 2-3 days prior to procedure.

## 2020-02-08 ENCOUNTER — Ambulatory Visit (HOSPITAL_COMMUNITY): Payer: Medicare Other | Attending: Cardiovascular Disease

## 2020-02-08 ENCOUNTER — Other Ambulatory Visit: Payer: Self-pay

## 2020-02-08 DIAGNOSIS — Z01818 Encounter for other preprocedural examination: Secondary | ICD-10-CM | POA: Diagnosis present

## 2020-02-08 DIAGNOSIS — I480 Paroxysmal atrial fibrillation: Secondary | ICD-10-CM | POA: Diagnosis present

## 2020-02-08 DIAGNOSIS — I25119 Atherosclerotic heart disease of native coronary artery with unspecified angina pectoris: Secondary | ICD-10-CM | POA: Insufficient documentation

## 2020-02-08 DIAGNOSIS — I358 Other nonrheumatic aortic valve disorders: Secondary | ICD-10-CM | POA: Diagnosis not present

## 2020-02-13 NOTE — Progress Notes (Signed)
  Radiation Oncology         [phone] ________________________________  Name: Charles Robinson MRN: [id number]  Date: 02/16/2020  DOB: 1946-09-12  SIMULATION AND TREATMENT PLANNING NOTE    ICD-10-CM   1. Malignant neoplasm of prostate ([place])  C61     DIAGNOSIS:  74 y.o. 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  NARRATIVE:  The patient was brought to the [facility].  Identity was confirmed.  All relevant records and images related to the planned course of therapy were reviewed.  The patient freely provided informed written consent to proceed with treatment after reviewing the details related to the planned course of therapy. The consent form was witnessed and verified by the simulation staff.  Then, the patient was set-up in a stable reproducible supine position for radiation therapy.  A vacuum lock pillow device was custom fabricated to position his legs in a reproducible immobilized position.  Then, I performed a urethrogram under sterile conditions to identify the prostatic bed.  CT images were obtained.  Surface markings were placed.  The CT images were loaded into the planning software.  Then the prostate bed target, pelvic lymph node target and avoidance structures including the rectum, bladder, bowel and hips were contoured.  Treatment planning then occurred.  The radiation prescription was entered and confirmed.  A total of one complex treatment devices were fabricated. I have requested : Intensity Modulated Radiotherapy (IMRT) is medically necessary for this case for the following reason:  Rectal sparing.[name]  PLAN:  The patient will receive 45 Gy in 25 fractions of 1.8 Gy, followed by a boost to the prostate to a total dose of 75 Gy with 15 additional fractions of 2 Gy.   ________________________________  Sheral Apley Tammi Klippel, M.D.

## 2020-02-15 ENCOUNTER — Telehealth: Payer: Self-pay | Admitting: *Deleted

## 2020-02-15 NOTE — Telephone Encounter (Signed)
CALLED PATIENT TO REMIND OF SIM AND MRI APPT. FOR 02-16-20, LVM FOR A RETURN CALL

## 2020-02-16 ENCOUNTER — Ambulatory Visit (HOSPITAL_COMMUNITY)
Admission: RE | Admit: 2020-02-16 | Discharge: 2020-02-16 | Disposition: A | Discharge status: Home / Self Care | Payer: Medicare Other | Source: Ambulatory Visit | Attending: Urology | Admitting: Urology

## 2020-02-16 ENCOUNTER — Encounter: Payer: Self-pay | Admitting: Medical Oncology

## 2020-02-16 ENCOUNTER — Ambulatory Visit
Admission: RE | Admit: 2020-02-16 | Discharge: 2020-02-16 | Disposition: A | Discharge status: Home / Self Care | Payer: Medicare Other | Source: Ambulatory Visit | Attending: Radiation Oncology | Admitting: Radiation Oncology

## 2020-02-16 ENCOUNTER — Other Ambulatory Visit: Payer: Self-pay

## 2020-02-16 DIAGNOSIS — C61 Malignant neoplasm of prostate: Secondary | ICD-10-CM

## 2020-02-20 ENCOUNTER — Encounter: Payer: Self-pay | Admitting: Medical Oncology

## 2020-02-20 ENCOUNTER — Telehealth: Payer: Self-pay | Admitting: Medical Oncology

## 2020-02-20 NOTE — Telephone Encounter (Signed)
Patient called asking if he can start his radiation treatments on 4/5 or 4/6. I informed him I would need to discuss with the radiation staff and get back with him. He voiced understanding.

## 2020-02-20 NOTE — Progress Notes (Signed)
I spoke with patient to let him know he will be able to start radiation Monday 4/5. He did receive a voicemail from radiation and will call them to confirm a time. He was very appreciative of my time and efforts in getting him an earlier start date.

## 2020-02-20 NOTE — Progress Notes (Signed)
Spoke with patient to inform him I spoke with radiation staff regarding moving start date of radiation. The therapist has spoken to the team and they will update me later today if his is possible.  I will call him later this afternoon with an update. He voiced understanding.

## 2020-02-23 DIAGNOSIS — C61 Malignant neoplasm of prostate: Secondary | ICD-10-CM | POA: Insufficient documentation

## 2020-02-26 ENCOUNTER — Encounter: Payer: Self-pay | Admitting: Medical Oncology

## 2020-02-26 ENCOUNTER — Ambulatory Visit
Admission: RE | Admit: 2020-02-26 | Discharge: 2020-02-26 | Disposition: A | Discharge status: Home / Self Care | Payer: Medicare Other | Source: Ambulatory Visit | Attending: Radiation Oncology | Admitting: Radiation Oncology

## 2020-02-26 ENCOUNTER — Other Ambulatory Visit: Payer: Self-pay

## 2020-02-26 DIAGNOSIS — C61 Malignant neoplasm of prostate: Secondary | ICD-10-CM | POA: Diagnosis not present

## 2020-02-27 ENCOUNTER — Other Ambulatory Visit: Payer: Self-pay

## 2020-02-27 ENCOUNTER — Ambulatory Visit
Admission: RE | Admit: 2020-02-27 | Discharge: 2020-02-27 | Disposition: A | Discharge status: Home / Self Care | Payer: Medicare Other | Source: Ambulatory Visit | Attending: Radiation Oncology | Admitting: Radiation Oncology

## 2020-02-27 DIAGNOSIS — C61 Malignant neoplasm of prostate: Secondary | ICD-10-CM | POA: Diagnosis not present

## 2020-02-28 ENCOUNTER — Ambulatory Visit: Payer: Medicare Other

## 2020-02-28 ENCOUNTER — Encounter: Payer: Self-pay | Admitting: Radiation Oncology

## 2020-02-28 ENCOUNTER — Other Ambulatory Visit: Payer: Self-pay

## 2020-02-28 ENCOUNTER — Ambulatory Visit
Admission: RE | Admit: 2020-02-28 | Discharge: 2020-02-28 | Disposition: A | Discharge status: Home / Self Care | Payer: Medicare Other | Source: Ambulatory Visit | Attending: Radiation Oncology | Admitting: Radiation Oncology

## 2020-02-28 ENCOUNTER — Telehealth: Payer: Self-pay | Admitting: Radiation Oncology

## 2020-02-28 DIAGNOSIS — C61 Malignant neoplasm of prostate: Secondary | ICD-10-CM | POA: Diagnosis not present

## 2020-02-28 NOTE — Telephone Encounter (Signed)
Opened in error

## 2020-02-28 NOTE — Progress Notes (Signed)
Received patient in the clinic following radiation treatment. Patient has received 3/25 intended fractions to his prostate_pelvic region as of today.   Patient expresses concern about urinary frequency. Patient reports that beginning last night he got up every hour to void. Reports frequency during the day isn't as bad as at night. Patient received Eligard on 11/20/2019. Patient endorses taking finasteride 5 mg and cardura 8 mg as directed.  Also, patient reports mild dysuria which [place] ruled out as a UTI just last week. Prostate volume at consult was 69 grams. Patient reports an intermittent urine stream but feels empty after voiding. Patient reports urinary urgency and leakage. Patient requesting medication to manage nocturia. Patient's preferred [facility] is [facility] [place].    74 y.o. 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 Tammi Klippel and reach out to him with further directions.

## 2020-02-28 NOTE — Progress Notes (Signed)
I would not suggest adding Flomax to the Cardura/Finasteride that he is already taking.  We could try stopping Cardura and switch to Flomax to see if this is more effective or... I would suggest we try a trial of Myrbetriq 25mg  to see if this helps with the nocturia. We could safely add the Myrbetriq to the Cardura/Finasteride that he is already taking. If Tammi Klippel is in agreement, I am happy to send the Rx to his pharmacy. Ailene Ards

## 2020-02-29 ENCOUNTER — Telehealth: Payer: Self-pay | Admitting: Radiation Oncology

## 2020-02-29 ENCOUNTER — Ambulatory Visit
Admission: RE | Admit: 2020-02-29 | Discharge: 2020-02-29 | Disposition: A | Discharge status: Home / Self Care | Payer: Medicare Other | Source: Ambulatory Visit | Attending: Radiation Oncology | Admitting: Radiation Oncology

## 2020-02-29 ENCOUNTER — Other Ambulatory Visit: Payer: Self-pay

## 2020-02-29 DIAGNOSIS — C61 Malignant neoplasm of prostate: Secondary | ICD-10-CM | POA: Diagnosis not present

## 2020-02-29 NOTE — Telephone Encounter (Signed)
Phoned Mickel Baas, RN for [name] at [facility]. Verbalized Dr. Johny Shears concern for urinary obstruction. Mickel Baas, RN arranged for patient to be evaluated tomorrow (Friday, April 9th) at 1315 by an NP.   Phoned patient and explained Dr. Johny Shears concern for obstruction. Explained that I arranged for him to be evaluated tomorrow at [facility] at 1315. Patient committed to this appointment. Instructed patient that once he was done at [facility] tomorrow he could present for radiation therapy after and we would work him in. Patient verbalized understanding. Treatment team informed of this change.

## 2020-03-01 ENCOUNTER — Other Ambulatory Visit: Payer: Self-pay

## 2020-03-01 ENCOUNTER — Ambulatory Visit
Admission: RE | Admit: 2020-03-01 | Discharge: 2020-03-01 | Disposition: A | Discharge status: Home / Self Care | Payer: Medicare Other | Source: Ambulatory Visit | Attending: Radiation Oncology | Admitting: Radiation Oncology

## 2020-03-01 DIAGNOSIS — C61 Malignant neoplasm of prostate: Secondary | ICD-10-CM | POA: Diagnosis not present

## 2020-03-04 ENCOUNTER — Ambulatory Visit
Admission: RE | Admit: 2020-03-04 | Discharge: 2020-03-04 | Disposition: A | Discharge status: Home / Self Care | Payer: Medicare Other | Source: Ambulatory Visit | Attending: Radiation Oncology | Admitting: Radiation Oncology

## 2020-03-04 ENCOUNTER — Other Ambulatory Visit: Payer: Self-pay

## 2020-03-04 DIAGNOSIS — C61 Malignant neoplasm of prostate: Secondary | ICD-10-CM | POA: Diagnosis not present

## 2020-03-05 ENCOUNTER — Ambulatory Visit
Admission: RE | Admit: 2020-03-05 | Discharge: 2020-03-05 | Disposition: A | Discharge status: Home / Self Care | Payer: Medicare Other | Source: Ambulatory Visit | Attending: Radiation Oncology | Admitting: Radiation Oncology

## 2020-03-05 ENCOUNTER — Other Ambulatory Visit: Payer: Self-pay

## 2020-03-05 DIAGNOSIS — C61 Malignant neoplasm of prostate: Secondary | ICD-10-CM | POA: Diagnosis not present

## 2020-03-06 ENCOUNTER — Ambulatory Visit
Admission: RE | Admit: 2020-03-06 | Discharge: 2020-03-06 | Disposition: A | Discharge status: Home / Self Care | Payer: Medicare Other | Source: Ambulatory Visit | Attending: Radiation Oncology | Admitting: Radiation Oncology

## 2020-03-06 ENCOUNTER — Other Ambulatory Visit: Payer: Self-pay

## 2020-03-06 DIAGNOSIS — C61 Malignant neoplasm of prostate: Secondary | ICD-10-CM | POA: Diagnosis not present

## 2020-03-07 ENCOUNTER — Ambulatory Visit
Admission: RE | Admit: 2020-03-07 | Discharge: 2020-03-07 | Disposition: A | Discharge status: Home / Self Care | Payer: Medicare Other | Source: Ambulatory Visit | Attending: Radiation Oncology | Admitting: Radiation Oncology

## 2020-03-07 ENCOUNTER — Other Ambulatory Visit: Payer: Self-pay

## 2020-03-07 DIAGNOSIS — C61 Malignant neoplasm of prostate: Secondary | ICD-10-CM | POA: Diagnosis not present

## 2020-03-08 ENCOUNTER — Other Ambulatory Visit: Payer: Self-pay

## 2020-03-08 ENCOUNTER — Ambulatory Visit
Admission: RE | Admit: 2020-03-08 | Discharge: 2020-03-08 | Disposition: A | Discharge status: Home / Self Care | Payer: Medicare Other | Source: Ambulatory Visit | Attending: Radiation Oncology | Admitting: Radiation Oncology

## 2020-03-08 DIAGNOSIS — C61 Malignant neoplasm of prostate: Secondary | ICD-10-CM | POA: Diagnosis not present

## 2020-03-11 ENCOUNTER — Other Ambulatory Visit: Payer: Self-pay

## 2020-03-11 ENCOUNTER — Ambulatory Visit
Admission: RE | Admit: 2020-03-11 | Discharge: 2020-03-11 | Disposition: A | Discharge status: Home / Self Care | Payer: Medicare Other | Source: Ambulatory Visit | Attending: Radiation Oncology | Admitting: Radiation Oncology

## 2020-03-11 DIAGNOSIS — C61 Malignant neoplasm of prostate: Secondary | ICD-10-CM | POA: Diagnosis not present

## 2020-03-12 ENCOUNTER — Other Ambulatory Visit: Payer: Self-pay

## 2020-03-12 ENCOUNTER — Ambulatory Visit
Admission: RE | Admit: 2020-03-12 | Discharge: 2020-03-12 | Disposition: A | Discharge status: Home / Self Care | Payer: Medicare Other | Source: Ambulatory Visit | Attending: Radiation Oncology | Admitting: Radiation Oncology

## 2020-03-12 DIAGNOSIS — C61 Malignant neoplasm of prostate: Secondary | ICD-10-CM | POA: Diagnosis not present

## 2020-03-13 ENCOUNTER — Ambulatory Visit
Admission: RE | Admit: 2020-03-13 | Discharge: 2020-03-13 | Disposition: A | Discharge status: Home / Self Care | Payer: Medicare Other | Source: Ambulatory Visit | Attending: Radiation Oncology | Admitting: Radiation Oncology

## 2020-03-13 ENCOUNTER — Other Ambulatory Visit: Payer: Self-pay

## 2020-03-13 DIAGNOSIS — C61 Malignant neoplasm of prostate: Secondary | ICD-10-CM | POA: Diagnosis not present

## 2020-03-14 ENCOUNTER — Ambulatory Visit
Admission: RE | Admit: 2020-03-14 | Discharge: 2020-03-14 | Disposition: A | Discharge status: Home / Self Care | Payer: Medicare Other | Source: Ambulatory Visit | Attending: Radiation Oncology | Admitting: Radiation Oncology

## 2020-03-14 ENCOUNTER — Other Ambulatory Visit: Payer: Self-pay

## 2020-03-14 DIAGNOSIS — C61 Malignant neoplasm of prostate: Secondary | ICD-10-CM | POA: Diagnosis not present

## 2020-03-15 ENCOUNTER — Other Ambulatory Visit: Payer: Self-pay

## 2020-03-15 ENCOUNTER — Encounter: Payer: Self-pay | Admitting: Radiation Oncology

## 2020-03-15 ENCOUNTER — Ambulatory Visit
Admission: RE | Admit: 2020-03-15 | Discharge: 2020-03-15 | Disposition: A | Discharge status: Home / Self Care | Payer: Medicare Other | Source: Ambulatory Visit | Attending: Radiation Oncology | Admitting: Radiation Oncology

## 2020-03-15 DIAGNOSIS — C61 Malignant neoplasm of prostate: Secondary | ICD-10-CM | POA: Diagnosis not present

## 2020-03-15 NOTE — Progress Notes (Signed)
Estill Bamberg, Please schedule an office appt with me or Alonza Bogus, PA after he has completed radiation therapy. MS

## 2020-03-15 NOTE — Progress Notes (Signed)
Scheduled appt with [name] on 06/20/20 at 9:30am. Patient notified and verbalized understanding.

## 2020-03-15 NOTE — Progress Notes (Signed)
  Radiation Oncology         [phone] ________________________________  Name: Charles Robinson MRN: [id number]  Date: 03/15/2020  DOB: [birth year]  Chart Note:  I spoke with the patient today about his positive Cologuard test in light of his current radiation therapy.  I advised that he connect with his gastroenterologist, [name].  The patient will be scheduled to complete radiation on May 28.  I would recommend waiting 4-6 weeks after radiation prior to proceeding with colonoscopy to allow for resolution of radiation proctitis.  ________________________________  Sheral Apley. Tammi Klippel, M.D.

## 2020-03-17 ENCOUNTER — Encounter (HOSPITAL_COMMUNITY): Payer: Self-pay

## 2020-03-17 ENCOUNTER — Other Ambulatory Visit: Payer: Self-pay

## 2020-03-17 ENCOUNTER — Ambulatory Visit (HOSPITAL_COMMUNITY)
Admission: EM | Admit: 2020-03-17 | Discharge: 2020-03-17 | Disposition: A | Discharge status: Home / Self Care | Payer: Medicare Other | Attending: Family Medicine | Admitting: Family Medicine

## 2020-03-17 DIAGNOSIS — C61 Malignant neoplasm of prostate: Secondary | ICD-10-CM | POA: Diagnosis not present

## 2020-03-17 DIAGNOSIS — N139 Obstructive and reflux uropathy, unspecified: Secondary | ICD-10-CM

## 2020-03-17 LAB — POCT URINALYSIS DIP (DEVICE)
Bilirubin Urine: NEGATIVE
Glucose, UA: NEGATIVE mg/dL
Ketones, ur: NEGATIVE mg/dL
Leukocytes,Ua: NEGATIVE
Nitrite: NEGATIVE
Protein, ur: 100 mg/dL — AB
Specific Gravity, Urine: 1.025 (ref 1.005–1.030)
Urobilinogen, UA: 0.2 mg/dL (ref 0.0–1.0)
pH: 5.5 (ref 5.0–8.0)

## 2020-03-17 NOTE — ED Provider Notes (Signed)
[facility]    CSN: [id number] Arrival date & time: 03/17/20  1010      History   Chief Complaint Chief Complaint  Patient presents with  . urinary issue    HPI Charles Robinson is a 74 y.o. male.   HPI\ Mr. Goldston states he is under the care of urology.  He called his urologist this morning.  He has been followed for the last couple of years because of high PSA tests.  He had a biopsy and it was found to be prostate cancer requiring treatment.  He is currently undergoing radiation therapy 5 days a week. Yesterday and today he is having increasing difficulty urinating.  He is urinating frequently.  He is unable to empty his bladder.  He only urinates a very small amount.  No burning on urination, cloudy urine, or hematuria.  No flank pain or abdominal pain.  No nausea or vomiting.  No fever or chills. He states he is tolerating the radiation therapy well with no side effects. He also has a history of heart disease.  Under the care of cardiology.  He is on chronic Eliquis and Plavix.   Past Medical History:  Diagnosis Date  . Arthritis    back  . BPH (benign prostatic hypertrophy)   . CAD S/P multivessel PCI: LAD, RCA and extensive LCX-OM1 03/15/2012   s/p PCI to LAD, to RCA (now occluded), then extensive PCI to Dominant LCx-OM1  (enitre prox-AVG Circ for dissection in 07/2011)  . Cancer ([place])    SKIN CA  . GERD (gastroesophageal reflux disease)   . History of colon polyps   . History of echocardiogram 08/16/2011   Echo - EF 60-65%; moderate LV concentric hypertrophy; abdnormal LV relaxation (grade 1 diastolic dysfunction; ascending aorta mildly dilated; LA moderately dilated;   . Hyperlipidemia   . Hypertension   . 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 PAF (paroxysmal atrial fibrillation) ([place]) 12/20/2012   followed by Dr. Glenetta Hew; s/p DCCV 2011, 07/2011 post PCI with Type 4a  MI; CHA2DS2Vasc = 3, on Warfarin  . Paroxysmal atrial flutter ([place]) 08/20/2011   TEE- atrial septum - no defect identified; RA normal in size, no evidence of thrombus; ascending aorta normal  . Prostate cancer ([place])   . Rupture quadriceps tendon     Patient Active Problem List   Diagnosis Date Noted  . Systolic murmur of aorta 78/67/6720  . Malignant neoplasm of prostate ([place]) 11/04/2019  . Pre-operative clearance 08/15/2019  . Left carotid artery stenosis 02/10/2016  . Chronic renal insufficiency, stage III (moderate) ([place]) 11/19/2015  . 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 Antiplatelet or antithrombotic long-term use 11/13/2014  . Obesity (BMI 30-39.9) 05/03/2014  . On amiodarone therapy 05/03/2014  . Chronic anticoagulation 04/21/2013  . Hyponatremia 09/21/2012  . Expected blood loss anemia 09/20/2012  . S/P left quad tendon repair 09/19/2012  . CAD, s/p multiple PCIs 06/28/2011    Class: Chronic  . PAF (paroxysmal atrial fibrillation) ([place])  01/13/2011    Class: History of  . EUSTACHIAN TUBE DYSFUNCTION, LEFT 01/30/2008  . BENIGN PROSTATIC HYPERTROPHY, WITH URINARY OBSTRUCTION 10/24/2007  . Dyslipidemia, goal LDL below 70 06/24/2007    Class: Chronic  . Essential hypertension 06/24/2007    Class: Diagnosis of  . Coronary artery disease involving native coronary artery of native heart with angina pectoris ([place]) 06/24/2007  . COLONIC POLYPS, HX OF  06/24/2007    Past Surgical History:  Procedure Laterality Date  . BACK SURGERY    . COLONOSCOPY    . CORONARY ANGIOPLASTY WITH STENT PLACEMENT  03/15/2012   multiple stens in AVGroove Circ & OM1;; 2 site PTCA w/ PTCA and stenting of OM2 ang PTCA of distal stent ISR followed by PTCA of mid LCx ISR  . CORONARY STENT PLACEMENT  2003 - 2011 - 2012 - 2013   Pre-2012 - BMS in RCA now known occluded, BMS in proximal LAD; 07/2011 - 2.25 x 23 BMS in OM 1 with significant disease on either side noted shortly after  -->  2 additional overlapping DES 2.5 mm x 30 mm and 2.25 mm x 26 mm in OM1,; 3 overlapping Resolute DES in AV groove Cx crossing OM1: (Tapered from 3.8-2.6 mm)- Resolute DES 3.5 x 22, 3.0 x 38, 2.5 x 14  . DOPPLER ECHOCARDIOGRAPHY  07/2011   EF ~60-65%, Grade 1 D Dysfxn; mod Conc LVH  . ENDARTERECTOMY Left 02/27/2016   Procedure: LEFT  CAROTID ARTERY ENDARTERECTOMY ;  Surgeon: Serafina Mitchell, MD;  Location: [facility];  Service: Vascular;  Laterality: Left;  . LEFT HEART CATHETERIZATION WITH CORONARY ANGIOGRAM N/A 03/15/2012   Procedure: LEFT HEART CATHETERIZATION WITH CORONARY ANGIOGRAM;  Surgeon: [name], MD;  Location: [facility] CATH LAB: patent LAD stents, patent LCx stents w/80% focal ISR just distal to OM; OM proximal stent open; -- 2 site PTCA-PCI  . LUMBAR LAMINECTOMY/DECOMPRESSION MICRODISCECTOMY Bilateral 12/11/2014   Procedure: Bilateral Lumbar Three-Four Laminectomy;  Surgeon: Kristeen Miss, MD;  Location: [facility] NEURO ORS;  Service: Neurosurgery;  Laterality: Bilateral;  bilateral  . NM MYOVIEW LTD  11/2012   No ischemia or infarction, normal EF & WM  . PATCH ANGIOPLASTY Left 02/27/2016   Procedure: WITH HEMASHIELD DACRON  PATCH ANGIOPLASTY;  Surgeon: Serafina Mitchell, MD;  Location: [facility];  Service: Vascular;  Laterality: Left;  [name] QUADRICEPS TENDON REPAIR  09/19/2012   Procedure: REPAIR QUADRICEP TENDON;  Surgeon: [name], MD;  Location: [facility];  Service: Orthopedics;  Laterality: Left;  [name] VASECTOMY         Home Medications    Prior to Admission medications   Medication Sig Start Date End Date Taking? Authorizing Provider  amiodarone (PACERONE) 200 MG tablet TAKE 1 TABLET BY MOUTH EVERY DAY 01/17/20   [name], MD  clopidogrel (PLAVIX) 75 MG tablet TAKE 1 TABLET BY MOUTH EVERY DAY 11/06/19   [name], MD  Coenzyme Q10 200 MG capsule Take 200 mg by mouth every morning.     [provider]  doxazosin (CARDURA) 8 MG tablet Take 4 mg by mouth every evening.      Dorena Cookey, MD  ELIQUIS 5 MG TABS tablet TAKE 1 TABLET BY MOUTH TWICE A DAY 10/27/19   [name], MD  ezetimibe (ZETIA) 10 MG tablet TAKE 1 TABLET BY MOUTH EVERY DAY 10/27/19   [name], MD  finasteride (PROSCAR) 5 MG tablet Take 5 mg by mouth every evening.     [provider]  Leuprolide Acetate (ELIGARD [place]) Inject into the skin.    [provider]  lisinopril (ZESTRIL) 5 MG tablet Take 1 tablet (5 mg total) by mouth daily. 01/24/20 04/23/20  [name], MD  Multiple Vitamin (MULTIVITAMIN WITH MINERALS) TABS tablet Take 1 tablet by mouth every morning.     [provider]  nitroGLYCERIN (NITROSTAT) 0.4 MG SL tablet PLACE  1 TABLET (0.4 MG TOTAL) UNDER THE TONGUE EVERY 5 (FIVE) MINUTES AS NEEDED. FOR CHEST PAIN 06/15/18   [name], MD  Omega-3 Fatty Acids (FISH OIL) 1000 MG CAPS Take 1,000 capsules by mouth 2 (two) times daily.     [provider]  rosuvastatin (CRESTOR) 40 MG tablet TAKE 1 TABLET BY MOUTH EVERY DAY 01/17/20   [name], MD    Family History Family History  Problem Relation Age of Onset  . Leukemia Mother   . Heart disease Father   . Bladder Cancer Sister   . Prostate cancer Brother   . Breast cancer Maternal Grandmother     Social History Social History   Tobacco Use  . Smoking status: Never Smoker  . Smokeless tobacco: Never Used  Substance Use Topics  . Alcohol use: Yes    Alcohol/week: 2.0 standard drinks    Types: 2 Cans of beer per week    Comment: rare  . Drug use: No     Allergies   Lipitor [atorvastatin], Morphine and related, and Vicodin [hydrocodone-acetaminophen]   Review of Systems Review of Systems  Constitutional: Positive for fatigue. Negative for chills and fever.  Gastrointestinal: Negative for abdominal pain, nausea and vomiting.  Genitourinary: Positive for decreased urine volume and frequency. Negative for flank pain.     Physical Exam Triage Vital Signs ED  Triage Vitals  Enc Vitals Group     BP 03/17/20 1036 137/74     Pulse Rate 03/17/20 1036 78     Resp 03/17/20 1036 18     Temp 03/17/20 1036 98.8 F (37.1 C)     Temp Source 03/17/20 1036 Oral     SpO2 03/17/20 1036 96 %     Weight 03/17/20 1032 232 lb (105.2 kg)     Height --      Head Circumference --      Peak Flow --      Pain Score 03/17/20 1032 2     Pain Loc --      Pain Edu? --      Excl. in [place]? --    No data found.  Updated Vital Signs BP 137/74 (BP Location: Right Arm)   Pulse 78   Temp 98.8 F (37.1 C) (Oral)   Resp 18   Wt 105.2 kg   SpO2 96%   BMI 29.00 kg/m     Physical Exam Constitutional:      General: He is not in acute distress.    Appearance: He is well-developed.     Comments: No acute distress  HENT:     Head: Normocephalic and atraumatic.     Mouth/Throat:     Comments: Mask in place Eyes:     Conjunctiva/sclera: Conjunctivae normal.     Pupils: Pupils are equal, round, and reactive to light.  Cardiovascular:     Rate and Rhythm: Normal rate.  Pulmonary:     Effort: Pulmonary effort is normal. No respiratory distress.  Abdominal:     General: There is no distension.     Tenderness: There is no abdominal tenderness.     Comments: Patient states no abdominal pain  Musculoskeletal:        General: Normal range of motion.     Cervical back: Normal range of motion.  Skin:    General: Skin is warm and dry.  Neurological:     General: No focal deficit present.     Mental Status: He is alert.  Psychiatric:        Mood and Affect: Mood normal.        Behavior: Behavior normal.      UC Treatments / Results  Labs (all labs ordered are listed, but only abnormal results are displayed) Labs Reviewed  POCT URINALYSIS DIP (DEVICE) - Abnormal; Notable for the following components:      Result Value   Glucose, UA >=1000 (*)    All other components within normal limits  POCT URINALYSIS DIP (DEVICE) - Abnormal; Notable for the following  components:   Hgb urine dipstick MODERATE (*)    Protein, ur 100 (*)    All other components within normal limits  URINE CULTURE    EKG   Radiology No results found.  Procedures Procedures (including critical care time)  Medications Ordered in UC Medications - No data to display  Initial Impression / Assessment and Plan / UC Course  I have reviewed the triage vital signs and the nursing notes.  Pertinent labs & imaging results that were available during my care of the patient were reviewed by me and considered in my medical decision making (see chart for details).    I spoke with the urologist on call.  Dr. Lovena Neighbours confirms that the patient needs a Foley catheter placed and left. We will do a urine analysis and culture   Final Clinical Impressions(s) / UC Diagnoses   Final diagnoses:  Prostate cancer [facility])  Urinary obstruction     Discharge Instructions     Call your urology office and radiation oncology first thing in the morning for instructions    ED Prescriptions    None     PDMP not reviewed this encounter.   Raylene Everts, MD 03/17/20 1155

## 2020-03-17 NOTE — Discharge Instructions (Addendum)
Call your urology office and radiation oncology first thing in the morning for instructions

## 2020-03-17 NOTE — ED Notes (Signed)
Error with urinalysis result at 11:08am. Disregard that result time. Urinalysis result from 11:13am is correct. I sent over the Point of care edit sheet so results can be fixed in Epic.

## 2020-03-17 NOTE — ED Triage Notes (Signed)
Pt states he has urinary frequency x 2 days. Pt states very little urine is passing through. Pt states his  urologist  states he needs to be catheterized.

## 2020-03-18 ENCOUNTER — Other Ambulatory Visit: Payer: Self-pay

## 2020-03-18 ENCOUNTER — Ambulatory Visit
Admission: RE | Admit: 2020-03-18 | Discharge: 2020-03-18 | Disposition: A | Discharge status: Home / Self Care | Payer: Medicare Other | Source: Ambulatory Visit | Attending: Radiation Oncology | Admitting: Radiation Oncology

## 2020-03-18 ENCOUNTER — Telehealth: Payer: Self-pay | Admitting: *Deleted

## 2020-03-18 ENCOUNTER — Telehealth: Payer: Self-pay | Admitting: Medical Oncology

## 2020-03-18 DIAGNOSIS — C61 Malignant neoplasm of prostate: Secondary | ICD-10-CM | POA: Diagnosis not present

## 2020-03-18 LAB — POCT URINALYSIS DIP (DEVICE)
Bilirubin Urine: NEGATIVE
Glucose, UA: >=1000 mg/dL — AB
Hgb urine dipstick: NEGATIVE
Ketones, ur: NEGATIVE mg/dL
Leukocytes,Ua: NEGATIVE
Nitrite: NEGATIVE
Protein, ur: NEGATIVE mg/dL
Specific Gravity, Urine: 1.015 (ref 1.005–1.030)
Urobilinogen, UA: 1 mg/dL (ref 0.0–1.0)
pH: 6 (ref 5.0–8.0)

## 2020-03-18 LAB — URINE CULTURE: Culture: NO GROWTH

## 2020-03-18 NOTE — Telephone Encounter (Signed)
Charles Robinson left a voicemail stating he now has a catheter. He would like to know if he should continue radiation treatments. When I returned his call, he was able to speak with someone and has just arrived. I went down to radiation  to see him after treatment.  He states he was unable to void and was up all night long. He called urologist on call and he instructed him to go to ER for catheter placement. He states he is feeling so much better and slept well last night. He has call into Dr. Lyndal Rainbow office for follow up.

## 2020-03-18 NOTE — Telephone Encounter (Signed)
Phoned patient to inform that it is o.k. to have radiation, per Inocente Salles, RN for Dr. Tammi Klippel, patient verified understanding this

## 2020-03-19 ENCOUNTER — Ambulatory Visit
Admission: RE | Admit: 2020-03-19 | Discharge: 2020-03-19 | Disposition: A | Discharge status: Home / Self Care | Payer: Medicare Other | Source: Ambulatory Visit | Attending: Radiation Oncology | Admitting: Radiation Oncology

## 2020-03-19 ENCOUNTER — Other Ambulatory Visit: Payer: Self-pay

## 2020-03-19 DIAGNOSIS — C61 Malignant neoplasm of prostate: Secondary | ICD-10-CM | POA: Diagnosis not present

## 2020-03-20 ENCOUNTER — Other Ambulatory Visit: Payer: Self-pay

## 2020-03-20 ENCOUNTER — Ambulatory Visit
Admission: RE | Admit: 2020-03-20 | Discharge: 2020-03-20 | Disposition: A | Discharge status: Home / Self Care | Payer: Medicare Other | Source: Ambulatory Visit | Attending: Radiation Oncology | Admitting: Radiation Oncology

## 2020-03-20 DIAGNOSIS — C61 Malignant neoplasm of prostate: Secondary | ICD-10-CM | POA: Diagnosis not present

## 2020-03-21 ENCOUNTER — Other Ambulatory Visit: Payer: Self-pay

## 2020-03-21 ENCOUNTER — Ambulatory Visit
Admission: RE | Admit: 2020-03-21 | Discharge: 2020-03-21 | Disposition: A | Discharge status: Home / Self Care | Payer: Medicare Other | Source: Ambulatory Visit | Attending: Radiation Oncology | Admitting: Radiation Oncology

## 2020-03-21 DIAGNOSIS — C61 Malignant neoplasm of prostate: Secondary | ICD-10-CM | POA: Diagnosis not present

## 2020-03-22 ENCOUNTER — Other Ambulatory Visit: Payer: Self-pay

## 2020-03-22 ENCOUNTER — Ambulatory Visit
Admission: RE | Admit: 2020-03-22 | Discharge: 2020-03-22 | Disposition: A | Discharge status: Home / Self Care | Payer: Medicare Other | Source: Ambulatory Visit | Attending: Radiation Oncology | Admitting: Radiation Oncology

## 2020-03-22 DIAGNOSIS — C61 Malignant neoplasm of prostate: Secondary | ICD-10-CM | POA: Diagnosis not present

## 2020-03-25 ENCOUNTER — Other Ambulatory Visit: Payer: Self-pay

## 2020-03-25 ENCOUNTER — Ambulatory Visit
Admission: RE | Admit: 2020-03-25 | Discharge: 2020-03-25 | Disposition: A | Discharge status: Home / Self Care | Payer: Medicare Other | Source: Ambulatory Visit | Attending: Radiation Oncology | Admitting: Radiation Oncology

## 2020-03-25 DIAGNOSIS — C61 Malignant neoplasm of prostate: Secondary | ICD-10-CM | POA: Insufficient documentation

## 2020-03-26 ENCOUNTER — Other Ambulatory Visit: Payer: Self-pay

## 2020-03-26 ENCOUNTER — Ambulatory Visit
Admission: RE | Admit: 2020-03-26 | Discharge: 2020-03-26 | Disposition: A | Discharge status: Home / Self Care | Payer: Medicare Other | Source: Ambulatory Visit | Attending: Radiation Oncology | Admitting: Radiation Oncology

## 2020-03-26 DIAGNOSIS — C61 Malignant neoplasm of prostate: Secondary | ICD-10-CM | POA: Diagnosis not present

## 2020-03-27 ENCOUNTER — Other Ambulatory Visit: Payer: Self-pay

## 2020-03-27 ENCOUNTER — Ambulatory Visit
Admission: RE | Admit: 2020-03-27 | Discharge: 2020-03-27 | Disposition: A | Discharge status: Home / Self Care | Payer: Medicare Other | Source: Ambulatory Visit | Attending: Radiation Oncology | Admitting: Radiation Oncology

## 2020-03-27 DIAGNOSIS — C61 Malignant neoplasm of prostate: Secondary | ICD-10-CM | POA: Diagnosis not present

## 2020-03-28 ENCOUNTER — Ambulatory Visit
Admission: RE | Admit: 2020-03-28 | Discharge: 2020-03-28 | Disposition: A | Discharge status: Home / Self Care | Payer: Medicare Other | Source: Ambulatory Visit | Attending: Radiation Oncology | Admitting: Radiation Oncology

## 2020-03-28 ENCOUNTER — Other Ambulatory Visit: Payer: Self-pay

## 2020-03-28 DIAGNOSIS — C61 Malignant neoplasm of prostate: Secondary | ICD-10-CM | POA: Diagnosis not present

## 2020-03-29 ENCOUNTER — Other Ambulatory Visit: Payer: Self-pay

## 2020-03-29 ENCOUNTER — Ambulatory Visit: Payer: Medicare Other

## 2020-03-29 ENCOUNTER — Ambulatory Visit
Admission: RE | Admit: 2020-03-29 | Discharge: 2020-03-29 | Disposition: A | Discharge status: Home / Self Care | Payer: Medicare Other | Source: Ambulatory Visit | Attending: Radiation Oncology | Admitting: Radiation Oncology

## 2020-03-29 DIAGNOSIS — C61 Malignant neoplasm of prostate: Secondary | ICD-10-CM | POA: Diagnosis not present

## 2020-04-01 ENCOUNTER — Other Ambulatory Visit: Payer: Self-pay

## 2020-04-01 ENCOUNTER — Ambulatory Visit
Admission: RE | Admit: 2020-04-01 | Discharge: 2020-04-01 | Disposition: A | Discharge status: Home / Self Care | Payer: Medicare Other | Source: Ambulatory Visit | Attending: Radiation Oncology | Admitting: Radiation Oncology

## 2020-04-01 DIAGNOSIS — C61 Malignant neoplasm of prostate: Secondary | ICD-10-CM | POA: Diagnosis not present

## 2020-04-02 ENCOUNTER — Ambulatory Visit
Admission: RE | Admit: 2020-04-02 | Discharge: 2020-04-02 | Disposition: A | Discharge status: Home / Self Care | Payer: Medicare Other | Source: Ambulatory Visit | Attending: Radiation Oncology | Admitting: Radiation Oncology

## 2020-04-02 ENCOUNTER — Other Ambulatory Visit: Payer: Self-pay

## 2020-04-02 DIAGNOSIS — C61 Malignant neoplasm of prostate: Secondary | ICD-10-CM | POA: Diagnosis not present

## 2020-04-03 ENCOUNTER — Ambulatory Visit
Admission: RE | Admit: 2020-04-03 | Discharge: 2020-04-03 | Disposition: A | Discharge status: Home / Self Care | Payer: Medicare Other | Source: Ambulatory Visit | Attending: Radiation Oncology | Admitting: Radiation Oncology

## 2020-04-03 ENCOUNTER — Ambulatory Visit: Payer: Medicare Other

## 2020-04-03 ENCOUNTER — Other Ambulatory Visit: Payer: Self-pay

## 2020-04-03 DIAGNOSIS — C61 Malignant neoplasm of prostate: Secondary | ICD-10-CM | POA: Diagnosis not present

## 2020-04-04 ENCOUNTER — Ambulatory Visit
Admission: RE | Admit: 2020-04-04 | Discharge: 2020-04-04 | Disposition: A | Discharge status: Home / Self Care | Payer: Medicare Other | Source: Ambulatory Visit | Attending: Radiation Oncology | Admitting: Radiation Oncology

## 2020-04-04 ENCOUNTER — Other Ambulatory Visit: Payer: Self-pay

## 2020-04-04 DIAGNOSIS — C61 Malignant neoplasm of prostate: Secondary | ICD-10-CM | POA: Diagnosis not present

## 2020-04-05 ENCOUNTER — Other Ambulatory Visit: Payer: Self-pay

## 2020-04-05 ENCOUNTER — Ambulatory Visit
Admission: RE | Admit: 2020-04-05 | Discharge: 2020-04-05 | Disposition: A | Discharge status: Home / Self Care | Payer: Medicare Other | Source: Ambulatory Visit | Attending: Radiation Oncology | Admitting: Radiation Oncology

## 2020-04-05 DIAGNOSIS — C61 Malignant neoplasm of prostate: Secondary | ICD-10-CM | POA: Diagnosis not present

## 2020-04-08 ENCOUNTER — Other Ambulatory Visit: Payer: Self-pay

## 2020-04-08 ENCOUNTER — Ambulatory Visit
Admission: RE | Admit: 2020-04-08 | Discharge: 2020-04-08 | Disposition: A | Discharge status: Home / Self Care | Payer: Medicare Other | Source: Ambulatory Visit | Attending: Radiation Oncology | Admitting: Radiation Oncology

## 2020-04-08 DIAGNOSIS — C61 Malignant neoplasm of prostate: Secondary | ICD-10-CM | POA: Diagnosis not present

## 2020-04-09 ENCOUNTER — Other Ambulatory Visit: Payer: Self-pay

## 2020-04-09 ENCOUNTER — Ambulatory Visit
Admission: RE | Admit: 2020-04-09 | Discharge: 2020-04-09 | Disposition: A | Discharge status: Home / Self Care | Payer: Medicare Other | Source: Ambulatory Visit | Attending: Radiation Oncology | Admitting: Radiation Oncology

## 2020-04-09 DIAGNOSIS — C61 Malignant neoplasm of prostate: Secondary | ICD-10-CM | POA: Diagnosis not present

## 2020-04-10 ENCOUNTER — Ambulatory Visit
Admission: RE | Admit: 2020-04-10 | Discharge: 2020-04-10 | Disposition: A | Discharge status: Home / Self Care | Payer: Medicare Other | Source: Ambulatory Visit | Attending: Radiation Oncology | Admitting: Radiation Oncology

## 2020-04-10 ENCOUNTER — Other Ambulatory Visit: Payer: Self-pay

## 2020-04-10 DIAGNOSIS — C61 Malignant neoplasm of prostate: Secondary | ICD-10-CM | POA: Diagnosis not present

## 2020-04-11 ENCOUNTER — Other Ambulatory Visit: Payer: Self-pay

## 2020-04-11 ENCOUNTER — Ambulatory Visit
Admission: RE | Admit: 2020-04-11 | Discharge: 2020-04-11 | Disposition: A | Discharge status: Home / Self Care | Payer: Medicare Other | Source: Ambulatory Visit | Attending: Radiation Oncology | Admitting: Radiation Oncology

## 2020-04-11 DIAGNOSIS — C61 Malignant neoplasm of prostate: Secondary | ICD-10-CM | POA: Diagnosis not present

## 2020-04-12 ENCOUNTER — Other Ambulatory Visit: Payer: Self-pay

## 2020-04-12 ENCOUNTER — Ambulatory Visit
Admission: RE | Admit: 2020-04-12 | Discharge: 2020-04-12 | Disposition: A | Discharge status: Home / Self Care | Payer: Medicare Other | Source: Ambulatory Visit | Attending: Radiation Oncology | Admitting: Radiation Oncology

## 2020-04-12 DIAGNOSIS — C61 Malignant neoplasm of prostate: Secondary | ICD-10-CM | POA: Diagnosis not present

## 2020-04-15 ENCOUNTER — Ambulatory Visit
Admission: RE | Admit: 2020-04-15 | Discharge: 2020-04-15 | Disposition: A | Discharge status: Home / Self Care | Payer: Medicare Other | Source: Ambulatory Visit | Attending: Radiation Oncology | Admitting: Radiation Oncology

## 2020-04-15 ENCOUNTER — Other Ambulatory Visit: Payer: Self-pay

## 2020-04-15 DIAGNOSIS — C61 Malignant neoplasm of prostate: Secondary | ICD-10-CM | POA: Diagnosis not present

## 2020-04-16 ENCOUNTER — Ambulatory Visit
Admission: RE | Admit: 2020-04-16 | Discharge: 2020-04-16 | Disposition: A | Discharge status: Home / Self Care | Payer: Medicare Other | Source: Ambulatory Visit | Attending: Radiation Oncology | Admitting: Radiation Oncology

## 2020-04-16 ENCOUNTER — Other Ambulatory Visit: Payer: Self-pay

## 2020-04-16 DIAGNOSIS — C61 Malignant neoplasm of prostate: Secondary | ICD-10-CM | POA: Diagnosis not present

## 2020-04-17 ENCOUNTER — Other Ambulatory Visit: Payer: Self-pay

## 2020-04-17 ENCOUNTER — Ambulatory Visit
Admission: RE | Admit: 2020-04-17 | Discharge: 2020-04-17 | Disposition: A | Discharge status: Home / Self Care | Payer: Medicare Other | Source: Ambulatory Visit | Attending: Radiation Oncology | Admitting: Radiation Oncology

## 2020-04-17 DIAGNOSIS — C61 Malignant neoplasm of prostate: Secondary | ICD-10-CM | POA: Diagnosis not present

## 2020-04-18 ENCOUNTER — Ambulatory Visit
Admission: RE | Admit: 2020-04-18 | Discharge: 2020-04-18 | Disposition: A | Discharge status: Home / Self Care | Payer: Medicare Other | Source: Ambulatory Visit | Attending: Radiation Oncology | Admitting: Radiation Oncology

## 2020-04-18 ENCOUNTER — Other Ambulatory Visit: Payer: Self-pay

## 2020-04-18 ENCOUNTER — Encounter: Payer: Self-pay | Admitting: Medical Oncology

## 2020-04-18 DIAGNOSIS — C61 Malignant neoplasm of prostate: Secondary | ICD-10-CM | POA: Diagnosis not present

## 2020-04-19 ENCOUNTER — Other Ambulatory Visit: Payer: Self-pay

## 2020-04-19 ENCOUNTER — Encounter: Payer: Self-pay | Admitting: Radiation Oncology

## 2020-04-19 ENCOUNTER — Ambulatory Visit: Payer: Medicare Other

## 2020-04-19 ENCOUNTER — Ambulatory Visit
Admission: RE | Admit: 2020-04-19 | Discharge: 2020-04-19 | Disposition: A | Discharge status: Home / Self Care | Payer: Medicare Other | Source: Ambulatory Visit | Attending: Radiation Oncology | Admitting: Radiation Oncology

## 2020-04-19 DIAGNOSIS — C61 Malignant neoplasm of prostate: Secondary | ICD-10-CM | POA: Diagnosis not present

## 2020-04-22 ENCOUNTER — Ambulatory Visit: Payer: Medicare Other

## 2020-04-23 ENCOUNTER — Ambulatory Visit: Payer: Medicare Other

## 2020-05-30 ENCOUNTER — Ambulatory Visit: Payer: Self-pay | Admitting: Urology

## 2020-06-11 ENCOUNTER — Telehealth: Payer: Self-pay

## 2020-06-11 NOTE — Telephone Encounter (Signed)
Left patient voicemail message in regards to telephone appointment with Freeman Caldron PA on 06/12/20 at 1:30pm. Called to review meaningful use, AUA and prostate questions. TM

## 2020-06-12 ENCOUNTER — Other Ambulatory Visit: Payer: Self-pay

## 2020-06-12 ENCOUNTER — Telehealth: Payer: Self-pay | Admitting: Radiation Oncology

## 2020-06-12 ENCOUNTER — Telehealth: Payer: Self-pay

## 2020-06-12 ENCOUNTER — Ambulatory Visit
Admission: RE | Admit: 2020-06-12 | Discharge: 2020-06-12 | Disposition: A | Discharge status: Home / Self Care | Payer: Medicare Other | Source: Ambulatory Visit | Attending: Urology | Admitting: Urology

## 2020-06-12 DIAGNOSIS — C61 Malignant neoplasm of prostate: Secondary | ICD-10-CM

## 2020-06-12 NOTE — Progress Notes (Signed)
Radiation Oncology         [phone] ________________________________  Name: Charles Robinson MRN: [id number]  Date: 06/12/2020  DOB: 01-23-1946  Post Treatment Note  CC: Charles Redwood, MD  Charles Redwood, MD  Diagnosis:   74.o. 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  Interval Since Last Radiation:  7.5 weeks  02/26/2020 - 04/19/20:  (concurrent with LT-ADT) 1. The prostate, seminal vesicles, and pelvic lymph nodes were initially treated to 45 Gy in 25 fractions of 1.8 Gy  2. The prostate only was boosted to 75 Gy with 15 additional fractions of 2.0 Gy   Narrative:  I spoke with the patient to conduct his routine scheduled 1 month follow up visit via telephone to spare the patient unnecessary potential exposure in the healthcare setting during the current COVID-19 pandemic. The patient was notified in advance and gave permission to proceed with this visit format. The patient tolerated his recent radiotherapy relatively well.  He did develop severe LUTS, despite maximal medical therapy on alpha blocker, 5-ARI and Myrbetriq, requiring foley catheter placement approximately 2.5 weeks into treatment and remained in place through completion of treatment.  He also reported fatigue and occasional constipation.                           On review of systems, the patient states that he is doing relatively well in general.  He reports that the Foley catheter was removed in the urology office a couple of weeks ago and he has been able to void without difficulty since that time.  He has continued with significant frequency, urgency and nocturia 5-6 times per night.  He also developed some dysuria which was resolved with a course of antibiotics prescribed by Dr. Junious Robinson at his follow-up visit a few weeks ago.  He denies any recent fever, chills or night sweats.  He has lost a substantial amount of weight over the past 6  months despite maintaining a good appetite and eating regular meals.  He denies any gross hematuria or incontinence but continues with a weak flow of stream and occasional straining to start his stream.  He has continued taking Flomax and finasteride daily but is no longer taking Myrbetriq.  He has not seen any blood in the stool but reports having a positive Cologuard test just prior to starting radiation.  He has a scheduled visit with his gastroenterologist, Dr. Fuller Robinson on 06/27/2020 to schedule his next colonoscopy.  ALLERGIES:  is allergic to lipitor [atorvastatin], morphine and related, and vicodin [hydrocodone-acetaminophen].  Meds: Current Outpatient Medications  Medication Sig Dispense Refill  . amiodarone (PACERONE) 200 MG tablet TAKE 1 TABLET BY MOUTH EVERY DAY 90 tablet 1  . clopidogrel (PLAVIX) 75 MG tablet TAKE 1 TABLET BY MOUTH EVERY DAY 90 tablet 2  . Coenzyme Q10 200 MG capsule Take 200 mg by mouth every morning.     [name] doxazosin (CARDURA) 8 MG tablet Take 4 mg by mouth every evening.     [name] ELIQUIS 5 MG TABS tablet TAKE 1 TABLET BY MOUTH TWICE A DAY 180 tablet 2  . ezetimibe (ZETIA) 10 MG tablet TAKE 1 TABLET BY MOUTH EVERY DAY 90 tablet 2  . finasteride (PROSCAR) 5 MG tablet Take 5 mg by mouth every evening.     [name] Leuprolide Acetate (ELIGARD [place]) Inject into the skin.    [name]  lisinopril (ZESTRIL) 5 MG tablet Take 1 tablet (5 mg total) by mouth daily. 90 tablet 3  . Multiple Vitamin (MULTIVITAMIN WITH MINERALS) TABS tablet Take 1 tablet by mouth every morning.     . nitroGLYCERIN (NITROSTAT) 0.4 MG SL tablet PLACE 1 TABLET (0.4 MG TOTAL) UNDER THE TONGUE EVERY 5 (FIVE) MINUTES AS NEEDED. FOR CHEST PAIN 25 tablet 4  . Omega-3 Fatty Acids (FISH OIL) 1000 MG CAPS Take 1,000 capsules by mouth 2 (two) times daily.     . rosuvastatin (CRESTOR) 40 MG tablet TAKE 1 TABLET BY MOUTH EVERY DAY 90 tablet 1   No current facility-administered medications for this encounter.    Physical Findings:   vitals were not taken for this visit.   Unable to assess due to telephone follow-up visit format.  Lab Findings: Lab Results  Component Value Date   WBC 5.7 02/28/2016   HGB 12.1 (L) 02/28/2016   HCT 36.9 (L) 02/28/2016   MCV 92.5 02/28/2016   PLT 126 (L) 02/28/2016     Radiographic Findings: No results found.  Impression/Robinson: 1. 74 y.o. 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 He will continue to follow up with urology for ongoing PSA determinations and has an appointment scheduled with Dr. Junious Robinson in 3 months. He understands what to expect with regards to PSA monitoring going forward. I will look forward to following his response to treatment via correspondence with urology, and would be happy to continue to participate in his care if clinically indicated. I talked to the patient about what to expect in the future, including his risk for erectile dysfunction and rectal bleeding. I encouraged him to call or return to the office if he has any questions regarding his previous radiation or possible radiation side effects. He was comfortable with this Robinson and will follow up as needed.  Today, a comprehensive survivorship care Robinson and treatment summary was reviewed with the patient today detailing his prostate cancer diagnosis, treatment course, potential late/long-term effects of treatment, appropriate follow-up care with recommendations for the future, and patient education resources.  A copy of this summary, along with a letter will be sent to the patient's primary care provider via fax.   2. Cancer screening:  Due to Mr. Meas history and his age, he should receive screening for skin cancers and colon cancer.  The information and recommendations are listed on the patient's comprehensive care Robinson/treatment summary and were reviewed in detail with the patient.     3. Health maintenance and wellness  promotion: Mr. Monje was encouraged to consume 5-7 servings of fruits and vegetables per day. He was provided a copy of the "Nutrition Rainbow" handout, as well as the handout "Take Control of Your Health and [place]" from the [place].  He was also encouraged to engage in moderate to vigorous exercise for 30 minutes per day most days of the week. Information was provided regarding the [facility] fitness program, which is designed for cancer survivors to help them become more physically fit after cancer treatments. We discussed that a healthy BMI is 18.5-24.9 and that maintaining a healthy weight reduces risk of cancer recurrences.  He was instructed to limit his alcohol consumption and continue to abstain from tobacco use.  Lastly, he was encouraged to use sunscreen and wear protective clothing when in the sun.     4. Support services/counseling: It is not  uncommon for this period of the patient's cancer care trajectory to be one of many emotions and stressors.  Mr. Almond was encouraged to take advantage of our many support services programs, support groups, and/or counseling in coping with his new life as a cancer survivor after completing anti-cancer treatment.  He was offered support today through active listening and expressive supportive counseling.  He was given information regarding our available services and encouraged to contact me with any questions or for help enrolling in any of our support group/programs.      Nicholos Johns, PA-C

## 2020-06-12 NOTE — Telephone Encounter (Signed)
[name] called triage with c/o pt having ongoing cough for years since 2017 CEA surgery. She is also concerned with pt's carotid stenosis and would like him to have every six month carotid duplex to watch this. We discussed symptoms of stroke and to report to ED immediately/call 911 if any of them occur. She verbalized understanding.

## 2020-06-12 NOTE — Telephone Encounter (Signed)
Received voicemail message from patient concerned about missing a call yesterday from Harlow Asa, LPN. Phoned patient back. No answer. Left voicemail message explaining T was calling to remind him of his telephone follow up appointment today at 1330 with Ashlyn Bruning, PA-C. Encouraged patient to keep phone close in anticipation of her call.

## 2020-06-17 ENCOUNTER — Telehealth: Payer: Self-pay | Admitting: Cardiology

## 2020-06-17 NOTE — Telephone Encounter (Signed)
Pt c/o medication issue:  1. Name of Medication: lisinopril (ZESTRIL) 5 MG tablet(Expired)  2. How are you currently taking this medication (dosage and times per day)? 1 tablet by mouth daily   3. Are you having a reaction (difficulty breathing--STAT)? No   4. What is your medication issue? Yakov is calling stating his PCP Dr. Brigitte Pulse is wanting him to switch from lisinopril to Olmesartan 20 MG's. Please advise.

## 2020-06-17 NOTE — Telephone Encounter (Signed)
Returned call to patient-he states his PCP would like to change his lisinopril to olmesartan 20 mg daily.   He is unsure why he wants this changed, states his BP is consistently 120s/70s but his PCP stated this was a more "modern" medicine.     He states he prefers his cardiac meds to be adjusted by Dr. Ellyn Hack.   Advised the change was more than likely for a reason  (possible cough-phone note 7/21)? But to make change as recommended but monitor BP at home and call with any significant change/concerns.  Advised would make Dr. Ellyn Hack aware as well.

## 2020-06-18 ENCOUNTER — Ambulatory Visit: Payer: Medicare Other | Admitting: Gastroenterology

## 2020-06-18 ENCOUNTER — Telehealth: Payer: Self-pay

## 2020-06-18 NOTE — Telephone Encounter (Signed)
Pt has been scheduled for a carotid duplex and to see MD. Damaris Schooner to pt's wife and she verbalized understanding of this appt.

## 2020-06-18 NOTE — Telephone Encounter (Signed)
I tend to agree with Hildred Alamin.    Usually the reason to change is related to dry hacking cough.  Otherwise, really no need to change.   Glenetta Hew, MD

## 2020-06-27 ENCOUNTER — Telehealth: Payer: Self-pay

## 2020-06-27 ENCOUNTER — Encounter: Payer: Self-pay | Admitting: Gastroenterology

## 2020-06-27 ENCOUNTER — Ambulatory Visit (INDEPENDENT_AMBULATORY_CARE_PROVIDER_SITE_OTHER): Payer: Medicare Other | Admitting: Gastroenterology

## 2020-06-27 VITALS — BP 118/66 | HR 88 | Ht 75.0 in | Wt 215.0 lb

## 2020-06-27 DIAGNOSIS — I6523 Occlusion and stenosis of bilateral carotid arteries: Secondary | ICD-10-CM | POA: Diagnosis not present

## 2020-06-27 DIAGNOSIS — Z7901 Long term (current) use of anticoagulants: Secondary | ICD-10-CM

## 2020-06-27 DIAGNOSIS — Z7902 Long term (current) use of antithrombotics/antiplatelets: Secondary | ICD-10-CM

## 2020-06-27 DIAGNOSIS — R195 Other fecal abnormalities: Secondary | ICD-10-CM

## 2020-06-27 MED ORDER — NA SULFATE-K SULFATE-MG SULF 17.5-3.13-1.6 GM/177ML PO SOLN: 1.0000 | Freq: Once | ORAL | 0 refills | Status: AC

## 2020-06-27 MED ORDER — PLENVU 140 G PO SOLR: 1.0000 | Freq: Once | ORAL | 0 refills | Status: DC

## 2020-06-27 NOTE — Telephone Encounter (Signed)
[facility] Pre-operative Risk Assessment     Request for surgical clearance:     Endoscopy Procedure  What type of surgery is being performed?     Colonoscopy  When is this surgery scheduled?     07/11/20  What type of clearance is required ?   Pharmacy  Are there any medications that need to be held prior to surgery and how long? Plavix x 5 days and Eliquis x 2 days  Practice name and name of physician performing surgery?      [facility]  What is your office phone and fax number?      Phone- [phone]  [phone]  Anesthesia type (None, local, MAC, general) ?       MAC

## 2020-06-27 NOTE — Telephone Encounter (Signed)
Patient with diagnosis of PAF on Eliquis for anticoagulation.    Procedure: colonoscopy  Date of procedure: 07/11/20  CHADS2-VASc score of  4 (HTN, AGE x 2, CAD)  CrCl 62 mL/min  Per office protocol, patient can hold Eliquis for 2 days prior to procedure.

## 2020-06-27 NOTE — Progress Notes (Signed)
History of Present Illness: This is a 74 year old male on Plavix and Elqiuis with a positive Cologuard. He completed radiation therapy for prostate cancer on May 28. See 12/29/2019 GI office note.  Last colonoscopy in 2011 showed 2 hyperplastic polyps and diverticulosis.  He has coronary artery disease status post multiple PCIs, carotid artery disease and atrial fibrillation maintained on Plavix and Eliquis.  No GI complaints. Denies weight loss, abdominal pain, constipation, diarrhea, change in stool caliber, melena, hematochezia, nausea, vomiting, dysphagia, reflux symptoms, chest pain.   Allergies  Allergen Reactions  . Lipitor [Atorvastatin] Other (See Comments)    Increased HR is currently tolerating --   . Morphine And Related Nausea And Vomiting  . Vicodin [Hydrocodone-Acetaminophen] Other (See Comments)    Feel crazy   Outpatient Medications Prior to Visit  Medication Sig Dispense Refill  . amiodarone (PACERONE) 200 MG tablet TAKE 1 TABLET BY MOUTH EVERY DAY 90 tablet 1  . clopidogrel (PLAVIX) 75 MG tablet TAKE 1 TABLET BY MOUTH EVERY DAY 90 tablet 2  . Coenzyme Q10 200 MG capsule Take 200 mg by mouth every morning.     [name] ELIQUIS 5 MG TABS tablet TAKE 1 TABLET BY MOUTH TWICE A DAY 180 tablet 2  . ezetimibe (ZETIA) 10 MG tablet TAKE 1 TABLET BY MOUTH EVERY DAY 90 tablet 2  . finasteride (PROSCAR) 5 MG tablet Take 5 mg by mouth every evening.     [name] Leuprolide Acetate (ELIGARD [place]) Inject into the skin.    [name] lisinopril (ZESTRIL) 5 MG tablet Take 1 tablet (5 mg total) by mouth daily. 90 tablet 3  . Multiple Vitamin (MULTIVITAMIN WITH MINERALS) TABS tablet Take 1 tablet by mouth every morning.     . nitroGLYCERIN (NITROSTAT) 0.4 MG SL tablet PLACE 1 TABLET (0.4 MG TOTAL) UNDER THE TONGUE EVERY 5 (FIVE) MINUTES AS NEEDED. FOR CHEST PAIN 25 tablet 4  . Omega-3 Fatty Acids (FISH OIL) 1000 MG CAPS Take 1,000 capsules by mouth 2 (two) times daily.     . rosuvastatin (CRESTOR) 40 MG tablet  TAKE 1 TABLET BY MOUTH EVERY DAY 90 tablet 1  . tamsulosin (FLOMAX) 0.4 MG CAPS capsule Take 0.4 mg by mouth daily.    [name] doxazosin (CARDURA) 8 MG tablet Take 4 mg by mouth every evening.      No facility-administered medications prior to visit.   Past Medical History:  Diagnosis Date  . Arthritis    back  . BPH (benign prostatic hypertrophy)   . CAD S/P multivessel PCI: LAD, RCA and extensive LCX-OM1 03/15/2012   s/p PCI to LAD, to RCA (now occluded), then extensive PCI to Dominant LCx-OM1  (enitre prox-AVG Circ for dissection in 07/2011)  . Cancer ([place])    SKIN CA  . GERD (gastroesophageal reflux disease)   . History of colon polyps   . History of echocardiogram 08/16/2011   Echo - EF 60-65%; moderate LV concentric hypertrophy; abdnormal LV relaxation (grade 1 diastolic dysfunction; ascending aorta mildly dilated; LA moderately dilated;   . Hyperlipidemia   . Hypertension   . 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 PAF (paroxysmal atrial fibrillation) ([place]) 12/20/2012   followed by Dr. Glenetta Hew; s/p DCCV 2011, 07/2011 post PCI with Type 4a MI; CHA2DS2Vasc = 3, on Warfarin  . Paroxysmal atrial flutter ([place]) 08/20/2011   TEE- atrial septum - no defect identified; RA normal in size, no  evidence of thrombus; ascending aorta normal  . Prostate cancer ([place])   . Rupture quadriceps tendon    Past Surgical History:  Procedure Laterality Date  . BACK SURGERY    . COLONOSCOPY    . CORONARY ANGIOPLASTY WITH STENT PLACEMENT  03/15/2012   multiple stens in AVGroove Circ & OM1;; 2 site PTCA w/ PTCA and stenting of OM2 ang PTCA of distal stent ISR followed by PTCA of mid LCx ISR  . CORONARY STENT PLACEMENT  2003 - 2011 - 2012 - 2013   Pre-2012 - BMS in RCA now known occluded, BMS in proximal LAD; 07/2011 - 2.25 x 23 BMS in OM 1 with significant disease on either side noted shortly after -->  2 additional  overlapping DES 2.5 mm x 30 mm and 2.25 mm x 26 mm in OM1,; 3 overlapping Resolute DES in AV groove Cx crossing OM1: (Tapered from 3.8-2.6 mm)- Resolute DES 3.5 x 22, 3.0 x 38, 2.5 x 14  . DOPPLER ECHOCARDIOGRAPHY  07/2011   EF ~60-65%, Grade 1 D Dysfxn; mod Conc LVH  . ENDARTERECTOMY Left 02/27/2016   Procedure: LEFT  CAROTID ARTERY ENDARTERECTOMY ;  Surgeon: Serafina Mitchell, MD;  Location: [facility];  Service: Vascular;  Laterality: Left;  . LEFT HEART CATHETERIZATION WITH CORONARY ANGIOGRAM N/A 03/15/2012   Procedure: LEFT HEART CATHETERIZATION WITH CORONARY ANGIOGRAM;  Surgeon: [name], MD;  Location: [facility] CATH LAB: patent LAD stents, patent LCx stents w/80% focal ISR just distal to OM; OM proximal stent open; -- 2 site PTCA-PCI  . LUMBAR LAMINECTOMY/DECOMPRESSION MICRODISCECTOMY Bilateral 12/11/2014   Procedure: Bilateral Lumbar Three-Four Laminectomy;  Surgeon: Kristeen Miss, MD;  Location: [facility];  Service: Neurosurgery;  Laterality: Bilateral;  bilateral  . NM MYOVIEW LTD  11/2012   No ischemia or infarction, normal EF & WM  . PATCH ANGIOPLASTY Left 02/27/2016   Procedure: WITH HEMASHIELD DACRON  PATCH ANGIOPLASTY;  Surgeon: Serafina Mitchell, MD;  Location: [facility];  Service: Vascular;  Laterality: Left;  [name] QUADRICEPS TENDON REPAIR  09/19/2012   Procedure: REPAIR QUADRICEP TENDON;  Surgeon: [name], MD;  Location: [facility];  Service: Orthopedics;  Laterality: Left;  [name] VASECTOMY     Social History   Socioeconomic History  . Marital status: Married    Spouse name: Not on file  . Number of children: 2  . Years of education: Not on file  . Highest education level: Not on file  Occupational History  . Not on file  Tobacco Use  . Smoking status: Never Smoker  . Smokeless tobacco: Never Used  Vaping Use  . Vaping Use: Never used  Substance and Sexual Activity  . Alcohol use: Yes    Alcohol/week: 2.0 standard drinks    Types: 2 Cans of beer per week    Comment: rare  . Drug use:  No  . Sexual activity: Not Currently  Other Topics Concern  . Not on file  Social History Narrative   Charles Robinson is a father of 2, grandfather of 67.  His daughter was classmates with Dr. Ellyn Hack in college.   He and his wife are currently in the process of moving houses, further down the street in order to downsize to a single-story house.  He has been working out at [employer].         Social Determinants of Health   Financial Resource Strain:   . Difficulty of Paying Living Expenses:   Food Insecurity:   .  Worried About Charity fundraiser in the Last Year:   . Arboriculturist in the Last Year:   Transportation Needs:   . Film/video editor (Medical):   [name] Lack of Transportation (Non-Medical):   Physical Activity:   . Days of Exercise per Week:   . Minutes of Exercise per Session:   Stress:   . Feeling of Stress :   Social Connections:   . Frequency of Communication with Friends and Family:   . Frequency of Social Gatherings with Friends and Family:   . Attends Religious Services:   . Active Member of Clubs or Organizations:   . Attends Archivist Meetings:   [name] Marital Status:    Family History  Problem Relation Age of Onset  . Leukemia Mother   . Heart disease Father   . Bladder Cancer Sister   . Prostate cancer Brother   . Breast cancer Maternal Grandmother       Physical Exam: General: Well developed, well nourished, no acute distress Head: Normocephalic and atraumatic Eyes:  sclerae anicteric, EOMI Ears: Normal auditory acuity Mouth: Not examined, mask on during Covid-19 pandemic Lungs: Clear throughout to auscultation Heart: Regular rate and rhythm; no murmurs, rubs or bruits Abdomen: Soft, non tender and non distended. No masses, hepatosplenomegaly or hernias noted. Normal Bowel sounds Rectal: Deferred to colonoscopy  Musculoskeletal: Symmetrical with no gross deformities  Pulses:  Normal pulses noted Extremities: No clubbing, cyanosis, edema or  deformities noted Neurological: Alert oriented x 4, grossly nonfocal Psychological:  Alert and cooperative. Normal mood and affect   Assessment and Recommendations:  1.  Positive Cologuard.  Rule out colorectal neoplasms.  Dr. Tammi Klippel, radiation oncology, recommended waiting at least 6 weeks following completion of prostate radiation therapy before proceeding with colonoscopy.  He passed the 6-week mark in mid July.  Schedule colonoscopy. The risks (including bleeding, perforation, infection, missed lesions, medication reactions and possible hospitalization or surgery if complications occur), benefits, and alternatives to colonoscopy with possible biopsy and possible polypectomy were discussed with the patient and they consent to proceed.   2.  A. fib, CAD, carotid artery disease.  Hold Plavix 5 days and Eliquis 2 days before procedure - will instruct when and how to resume after procedure. Low but real risk of cardiovascular event such as heart attack, stroke, embolism, thrombosis or ischemia/infarct of other organs off Plavix and Eliquis explained and need to seek urgent help if this occurs. The patient consents to proceed. Will communicate by phone or EMR with patient's prescribing provider to confirm that holding Plavix and Eliquis is reasonable in this case.   3.  Prostate cancer status post radiation therapy.

## 2020-06-27 NOTE — Patient Instructions (Signed)
You have been scheduled for a colonoscopy. Please follow written instructions given to you at your visit today.  Please pick up your prep supplies at the pharmacy within the next 1-3 days. If you use inhalers (even only as needed), please bring them with you on the day of your procedure.  Thank you for choosing me and [facility].  Malcolm T. Stark, Jr., MD., FACG  

## 2020-06-28 ENCOUNTER — Other Ambulatory Visit: Payer: Self-pay | Admitting: *Deleted

## 2020-06-28 DIAGNOSIS — I6523 Occlusion and stenosis of bilateral carotid arteries: Secondary | ICD-10-CM

## 2020-06-28 NOTE — Telephone Encounter (Signed)
Informed patient per Cardiology to hold Plavix x 5 days prior to procedure and Eliquis 2 days prior. Patient verbalized understanding.

## 2020-06-28 NOTE — Telephone Encounter (Signed)
Left message for patient to return my call.

## 2020-06-28 NOTE — Telephone Encounter (Signed)
   Primary Cardiologist: Glenetta Hew, MD  Chart reviewed as part of pre-operative protocol coverage. Given past medical history and time since last visit, based on ACC/AHA guidelines, Charles Robinson would be at acceptable risk for the planned procedure without further cardiovascular testing.   Patient with diagnosis of PAF on Eliquis for anticoagulation.    Procedure: colonoscopy          Date of procedure: 07/11/20  CHADS2-VASc score of  4 (HTN, AGE x 2, CAD)  CrCl 62 mL/min  Per office protocol, patient can hold Eliquis for 2 days prior to procedure.    He may hold his Plavix for 5 days prior to his procedure.  Please resume as soon as hemostasis is achieved  I will route this recommendation to the requesting party via Epic fax function and remove from pre-op pool.  Please call with questions.  Jossie Ng. Donie Lemelin NP-C    06/28/2020, 9:39 AM [facility] [address] Office [phone] Fax [phone]

## 2020-06-30 ENCOUNTER — Emergency Department (HOSPITAL_COMMUNITY): Payer: Medicare Other

## 2020-06-30 ENCOUNTER — Other Ambulatory Visit: Payer: Self-pay

## 2020-06-30 ENCOUNTER — Emergency Department (HOSPITAL_COMMUNITY)
Admission: EM | Admit: 2020-06-30 | Discharge: 2020-06-30 | Disposition: A | Discharge status: Home / Self Care | Payer: Medicare Other | Attending: Emergency Medicine | Admitting: Emergency Medicine

## 2020-06-30 ENCOUNTER — Telehealth: Payer: Self-pay | Admitting: Student

## 2020-06-30 DIAGNOSIS — Y939 Activity, unspecified: Secondary | ICD-10-CM | POA: Insufficient documentation

## 2020-06-30 DIAGNOSIS — W19XXXA Unspecified fall, initial encounter: Secondary | ICD-10-CM | POA: Diagnosis not present

## 2020-06-30 DIAGNOSIS — Z23 Encounter for immunization: Secondary | ICD-10-CM | POA: Insufficient documentation

## 2020-06-30 DIAGNOSIS — I4891 Unspecified atrial fibrillation: Secondary | ICD-10-CM | POA: Insufficient documentation

## 2020-06-30 DIAGNOSIS — I119 Hypertensive heart disease without heart failure: Secondary | ICD-10-CM | POA: Diagnosis not present

## 2020-06-30 DIAGNOSIS — Y999 Unspecified external cause status: Secondary | ICD-10-CM | POA: Diagnosis not present

## 2020-06-30 DIAGNOSIS — R55 Syncope and collapse: Secondary | ICD-10-CM | POA: Diagnosis not present

## 2020-06-30 DIAGNOSIS — S0081XA Abrasion of other part of head, initial encounter: Secondary | ICD-10-CM | POA: Insufficient documentation

## 2020-06-30 DIAGNOSIS — S0993XA Unspecified injury of face, initial encounter: Secondary | ICD-10-CM | POA: Diagnosis present

## 2020-06-30 DIAGNOSIS — Y929 Unspecified place or not applicable: Secondary | ICD-10-CM | POA: Insufficient documentation

## 2020-06-30 DIAGNOSIS — Z955 Presence of coronary angioplasty implant and graft: Secondary | ICD-10-CM | POA: Diagnosis not present

## 2020-06-30 LAB — CBC
HCT: 37.1 % — ABNORMAL LOW (ref 39.0–52.0)
Hemoglobin: 11.6 g/dL — ABNORMAL LOW (ref 13.0–17.0)
MCH: 29.6 pg (ref 26.0–34.0)
MCHC: 31.3 g/dL (ref 30.0–36.0)
MCV: 94.6 fL (ref 80.0–100.0)
Platelets: 214 10*3/uL (ref 150–400)
RBC: 3.92 MIL/uL — ABNORMAL LOW (ref 4.22–5.81)
RDW: 12.3 % (ref 11.5–15.5)
WBC: 5.7 10*3/uL (ref 4.0–10.5)
nRBC: 0 % (ref 0.0–0.2)

## 2020-06-30 LAB — COMPREHENSIVE METABOLIC PANEL
ALT: 18 U/L (ref 0–44)
AST: 27 U/L (ref 15–41)
Albumin: 2.6 g/dL — ABNORMAL LOW (ref 3.5–5.0)
Alkaline Phosphatase: 61 U/L (ref 38–126)
Anion gap: 12 (ref 5–15)
BUN: 21 mg/dL (ref 8–23)
CO2: 25 mmol/L (ref 22–32)
Calcium: 8.6 mg/dL — ABNORMAL LOW (ref 8.9–10.3)
Chloride: 104 mmol/L (ref 98–111)
Creatinine, Ser: 1.46 mg/dL — ABNORMAL HIGH (ref 0.61–1.24)
GFR calc Af Amer: 53 mL/min — ABNORMAL LOW (ref >60)
GFR calc non Af Amer: 46 mL/min — ABNORMAL LOW (ref >60)
Glucose, Bld: 125 mg/dL — ABNORMAL HIGH (ref 70–99)
Potassium: 4.2 mmol/L (ref 3.5–5.1)
Sodium: 141 mmol/L (ref 135–145)
Total Bilirubin: 0.9 mg/dL (ref 0.3–1.2)
Total Protein: 5.7 g/dL — ABNORMAL LOW (ref 6.5–8.1)

## 2020-06-30 LAB — URINALYSIS, ROUTINE W REFLEX MICROSCOPIC
Bacteria, UA: NONE SEEN
Bilirubin Urine: NEGATIVE
Glucose, UA: NEGATIVE mg/dL
Ketones, ur: NEGATIVE mg/dL
Leukocytes,Ua: NEGATIVE
Nitrite: NEGATIVE
Protein, ur: NEGATIVE mg/dL
Specific Gravity, Urine: 1.021 (ref 1.005–1.030)
pH: 5 (ref 5.0–8.0)

## 2020-06-30 LAB — PROTIME-INR
INR: 1.1 (ref 0.8–1.2)
Prothrombin Time: 13.6 seconds (ref 11.4–15.2)

## 2020-06-30 LAB — LACTIC ACID, PLASMA: Lactic Acid, Venous: 1.2 mmol/L (ref 0.5–1.9)

## 2020-06-30 LAB — TROPONIN I (HIGH SENSITIVITY)
Troponin I (High Sensitivity): 20 ng/L — ABNORMAL HIGH (ref <18)
Troponin I (High Sensitivity): 26 ng/L — ABNORMAL HIGH (ref <18)

## 2020-06-30 MED ORDER — SODIUM CHLORIDE 0.9 % IV BOLUS
500.0000 mL | Freq: Once | INTRAVENOUS | Status: AC
  Administered 2020-06-30: 500 mL via INTRAVENOUS

## 2020-06-30 MED ORDER — LIDOCAINE HCL (PF) 1 % IJ SOLN
30.0000 mL | Freq: Once | INTRAMUSCULAR | Status: AC
  Administered 2020-06-30: 30 mL
  Filled 2020-06-30: qty 30

## 2020-06-30 MED ORDER — TETANUS-DIPHTH-ACELL PERTUSSIS 5-2.5-18.5 LF-MCG/0.5 IM SUSP
0.5000 mL | Freq: Once | INTRAMUSCULAR | Status: AC
  Administered 2020-06-30: 0.5 mL via INTRAMUSCULAR
  Filled 2020-06-30: qty 0.5

## 2020-06-30 MED ORDER — METOPROLOL TARTRATE 25 MG PO TABS
25.0000 mg | ORAL_TABLET | Freq: Two times a day (BID) | ORAL | 1 refills | Status: DC
Start: 2020-06-30 — End: 2020-07-08

## 2020-06-30 NOTE — Discharge Instructions (Addendum)
You were evaluated in the Emergency Department and after careful evaluation, we did not find any emergent condition requiring admission or further testing in the hospital.  Your exam/testing today is overall reassuring.  Your symptoms seem to be due to A. fib with an elevated heart rate.  We advise that you follow-up with Dr. Ellyn Hack within the next few days to discuss your symptoms and the management of your A. fib.  Until then please take the metoprolol medication twice daily along with your other medications.  Your laceration was repaired with absorbable sutures and they do not need to be removed.  Please return to the Emergency Department if you experience any worsening of your condition.   Thank you for allowing [place] to be a part of your care.

## 2020-06-30 NOTE — Telephone Encounter (Signed)
   Received page from Answering Service that patient had fall and thinks he is in atrial fibrillation. Called and spoke with patient wife. She states patient fell this morning. One of their friends who is a physician came to check on him and thinks he may be in atrial fibrillation which he does have a history of. It does sounds like patient was symptomatic prior to fall and reports feeling very week. Reports palpitations at this time and heart rates in the 120's. He thinks he may of hit his head on the table leg during fall. Wife does not think he ever had any LOC. Advised wife that I think he needs to come to the ED for further evaluation. She voice understanding and agreed. She cannot drive due to macular degeneration so will call EMS.  Darreld Mclean, PA-C 06/30/2020 11:06 AM

## 2020-06-30 NOTE — ED Notes (Signed)
Patient verbalizes understanding of discharge instructions. Opportunity for questioning and answers were provided. Arm band removed by staff, patient discharged from ED. 

## 2020-06-30 NOTE — ED Triage Notes (Signed)
BIB [facility] pt had a fall around 10 am due to weakness, hit face- head lac visible. Pt has a hx of Afib- on blood thinners (Plavix and Eliquis). EMS reports pulse rate of 110-140 ECG shows Afib w/ RVR. Denies chest pain, dizziness, SoB. Just reporting feeling weak. BP- 120/88. All of V/s stable.

## 2020-06-30 NOTE — Progress Notes (Signed)
Orthopedic Tech Progress Note Patient Details:  Charles Robinson 1946-05-11 [id number] Level 2 trauma Patient ID: Charles Robinson, male   DOB: 03-08-1946, 74 y.o.   MRN: [id number]   [name] 06/30/2020, 4:02 PM

## 2020-06-30 NOTE — ED Provider Notes (Signed)
[facility] Emergency Department Provider Note MRN:  [id number]  Arrival date & time: 06/30/20     Chief Complaint   Fall History of Present Illness   Charles Robinson is a 74 y.o. year-old male with a history of CAD, A. fib presenting to the ED with chief complaint of fall.  Patient presenting as a level 2 trauma for fall on blood thinners.  Takes Eliquis for A. fib.  Patient explains that he became very lightheaded and this caused him to nearly pass out and fall to the ground, striking his face against the ground.  Unsure of loss of consciousness.  Endorsing mild facial pain.  No chest pain or shortness of breath, no abdominal pain, no injuries to the arms or legs.  States that he has likely missed a few doses of his Eliquis over the past month.  Review of Systems  A complete 10 system review of systems was obtained and all systems are negative except as noted in the HPI and PMH.   Patient's Health History    Past Medical History:  Diagnosis Date  . Arthritis    back  . BPH (benign prostatic hypertrophy)   . CAD S/P multivessel PCI: LAD, RCA and extensive LCX-OM1 03/15/2012   s/p PCI to LAD, to RCA (now occluded), then extensive PCI to Dominant LCx-OM1  (enitre prox-AVG Circ for dissection in 07/2011)  . Cancer ([place])    SKIN CA  . GERD (gastroesophageal reflux disease)   . History of colon polyps   . History of echocardiogram 08/16/2011   Echo - EF 60-65%; moderate LV concentric hypertrophy; abdnormal LV relaxation (grade 1 diastolic dysfunction; ascending aorta mildly dilated; LA moderately dilated;   . Hyperlipidemia   . Hypertension   . 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 PAF (paroxysmal atrial fibrillation) ([place]) 12/20/2012   followed by Dr. Glenetta Hew; s/p DCCV 2011, 07/2011 post PCI with Type 4a MI; CHA2DS2Vasc = 3, on Warfarin  . Paroxysmal atrial flutter ([place])  08/20/2011   TEE- atrial septum - no defect identified; RA normal in size, no evidence of thrombus; ascending aorta normal  . Prostate cancer ([place])   . Rupture quadriceps tendon     Past Surgical History:  Procedure Laterality Date  . BACK SURGERY    . COLONOSCOPY    . CORONARY ANGIOPLASTY WITH STENT PLACEMENT  03/15/2012   multiple stens in AVGroove Circ & OM1;; 2 site PTCA w/ PTCA and stenting of OM2 ang PTCA of distal stent ISR followed by PTCA of mid LCx ISR  . CORONARY STENT PLACEMENT  2003 - 2011 - 2012 - 2013   Pre-2012 - BMS in RCA now known occluded, BMS in proximal LAD; 07/2011 - 2.25 x 23 BMS in OM 1 with significant disease on either side noted shortly after -->  2 additional overlapping DES 2.5 mm x 30 mm and 2.25 mm x 26 mm in OM1,; 3 overlapping Resolute DES in AV groove Cx crossing OM1: (Tapered from 3.8-2.6 mm)- Resolute DES 3.5 x 22, 3.0 x 38, 2.5 x 14  . DOPPLER ECHOCARDIOGRAPHY  07/2011   EF ~60-65%, Grade 1 D Dysfxn; mod Conc LVH  . ENDARTERECTOMY Left 02/27/2016   Procedure: LEFT  CAROTID ARTERY ENDARTERECTOMY ;  Surgeon: Serafina Mitchell, MD;  Location: [place];  Service: Vascular;  Laterality: Left;  . LEFT HEART CATHETERIZATION WITH CORONARY ANGIOGRAM N/A 03/15/2012  Procedure: LEFT HEART CATHETERIZATION WITH CORONARY ANGIOGRAM;  Surgeon: [name], MD;  Location: [facility] CATH LAB: patent LAD stents, patent LCx stents w/80% focal ISR just distal to OM; OM proximal stent open; -- 2 site PTCA-PCI  . LUMBAR LAMINECTOMY/DECOMPRESSION MICRODISCECTOMY Bilateral 12/11/2014   Procedure: Bilateral Lumbar Three-Four Laminectomy;  Surgeon: Kristeen Miss, MD;  Location: [place] NEURO ORS;  Service: Neurosurgery;  Laterality: Bilateral;  bilateral  . NM MYOVIEW LTD  11/2012   No ischemia or infarction, normal EF & WM  . PATCH ANGIOPLASTY Left 02/27/2016   Procedure: WITH HEMASHIELD DACRON  PATCH ANGIOPLASTY;  Surgeon: Serafina Mitchell, MD;  Location: [place];  Service: Vascular;  Laterality: Left;    [name] QUADRICEPS TENDON REPAIR  09/19/2012   Procedure: REPAIR QUADRICEP TENDON;  Surgeon: [name], MD;  Location: [facility];  Service: Orthopedics;  Laterality: Left;  [name] VASECTOMY      Family History  Problem Relation Age of Onset  . Leukemia Mother   . Heart disease Father   . Bladder Cancer Sister   . Prostate cancer Brother   . Breast cancer Maternal Grandmother     Social History   Socioeconomic History  . Marital status: Married    Spouse name: Not on file  . Number of children: 2  . Years of education: Not on file  . Highest education level: Not on file  Occupational History  . Not on file  Tobacco Use  . Smoking status: Never Smoker  . Smokeless tobacco: Never Used  Vaping Use  . Vaping Use: Never used  Substance and Sexual Activity  . Alcohol use: Yes    Alcohol/week: 2.0 standard drinks    Types: 2 Cans of beer per week    Comment: rare  . Drug use: No  . Sexual activity: Not Currently  Other Topics Concern  . Not on file  Social History Narrative   [name] is a father of 2, grandfather of 43.  His daughter was classmates with Dr. Ellyn Hack in college.   He and his wife are currently in the process of moving houses, further down the street in order to downsize to a single-story house.  He has been working out at [employer].         Social Determinants of Health   Financial Resource Strain:   . Difficulty of Paying Living Expenses:   Food Insecurity:   . Worried About Charity fundraiser in the Last Year:   . Arboriculturist in the Last Year:   Transportation Needs:   . Film/video editor (Medical):   [name] Lack of Transportation (Non-Medical):   Physical Activity:   . Days of Exercise per Week:   . Minutes of Exercise per Session:   Stress:   . Feeling of Stress :   Social Connections:   . Frequency of Communication with Friends and Family:   . Frequency of Social Gatherings with Friends and Family:   . Attends Religious Services:   . Active Member of  Clubs or Organizations:   . Attends Archivist Meetings:   [name] Marital Status:   Intimate Partner Violence:   . Fear of Current or Ex-Partner:   . Emotionally Abused:   [name] Physically Abused:   . Sexually Abused:      Physical Exam   Vitals:   06/30/20 1156  BP: 128/74  Pulse: (!) 120  Resp: 18  Temp: 98.3 F (36.8 C)  SpO2: 96%  CONSTITUTIONAL: Chronically ill-appearing, NAD NEURO:  Alert and oriented x 3, no focal deficits EYES:  eyes equal and reactive ENT/NECK:  no LAD, no JVD CARDIO: Tachycardic rate, well-perfused, normal S1 and S2 PULM:  CTAB no wheezing or rhonchi GI/GU:  normal bowel sounds, non-distended, non-tender MSK/SPINE:  No gross deformities, no edema SKIN: Linear abrasions to the right side of the face with small laceration to the right upper lip PSYCH:  Appropriate speech and behavior  *Additional and/or pertinent findings included in MDM below  Diagnostic and Interventional Summary    EKG Interpretation  Date/Time:  Sunday June 30 2020 11:57:41 EDT Ventricular Rate:  128 PR Interval:    QRS Duration: 90 QT Interval:  363 QTC Calculation: 530 R Axis:   70 Text Interpretation: Atrial fibrillation Ventricular premature complex Minimal ST depression, inferior leads Prolonged QT interval Confirmed by Gerlene Fee [id number]) on 06/30/2020 12:01:50 PM      Labs Reviewed  CBC - Abnormal; Notable for the following components:      Result Value   RBC 3.92 (*)    Hemoglobin 11.6 (*)    HCT 37.1 (*)    All other components within normal limits  COMPREHENSIVE METABOLIC PANEL - Abnormal; Notable for the following components:   Glucose, Bld 125 (*)    Creatinine, Ser 1.46 (*)    Calcium 8.6 (*)    Total Protein 5.7 (*)    Albumin 2.6 (*)    GFR calc non Af Amer 46 (*)    GFR calc Af Amer 53 (*)    All other components within normal limits  URINALYSIS, ROUTINE W REFLEX MICROSCOPIC - Abnormal; Notable for the following components:   Hgb urine  dipstick MODERATE (*)    All other components within normal limits  TROPONIN I (HIGH SENSITIVITY) - Abnormal; Notable for the following components:   Troponin I (High Sensitivity) 20 (*)    All other components within normal limits  TROPONIN I (HIGH SENSITIVITY) - Abnormal; Notable for the following components:   Troponin I (High Sensitivity) 26 (*)    All other components within normal limits  LACTIC ACID, PLASMA  PROTIME-INR    CT HEAD WO CONTRAST  Final Result    CT CERVICAL SPINE WO CONTRAST  Final Result    CT MAXILLOFACIAL WO CONTRAST  Final Result    DG Chest Portable 1 View  Final Result      Medications  lidocaine (PF) (XYLOCAINE) 1 % injection 30 mL (has no administration in time range)  sodium chloride 0.9 % bolus 500 mL (500 mLs Intravenous Bolus from Bag 06/30/20 1304)  Tdap (BOOSTRIX) injection 0.5 mL (0.5 mLs Intramuscular Given 06/30/20 1306)     Procedures  /  Critical Care .Critical Care Performed by: Maudie Flakes, MD Authorized by: Maudie Flakes, MD   Critical care provider statement:    Critical care time (minutes):  45   Critical care was necessary to treat or prevent imminent or life-threatening deterioration of the following conditions: A. fib with RVR.   Critical care was time spent personally by me on the following activities:  Discussions with consultants, evaluation of patient's response to treatment, examination of patient, ordering and performing treatments and interventions, ordering and review of laboratory studies, ordering and review of radiographic studies, pulse oximetry, re-evaluation of patient's condition, obtaining history from patient or surrogate and review of old charts .Marland KitchenLaceration Repair  Date/Time: 06/30/2020 4:15 PM Performed by: Maudie Flakes, MD Authorized by: Gerlene Fee  M, MD   Consent:    Consent obtained:  Verbal   Consent given by:  Patient   Risks discussed:  Infection, need for additional repair, nerve damage,  poor wound healing, poor cosmetic result, pain, retained foreign body, tendon damage and vascular damage Anesthesia (see MAR for exact dosages):    Anesthesia method:  Local infiltration   Local anesthetic:  Lidocaine 1% w/o epi Laceration details:    Location:  Lip   Lip location:  Upper exterior lip   Length (cm):  3   Depth (mm):  2 Repair type:    Repair type:  Simple Pre-procedure details:    Preparation:  Patient was prepped and draped in usual sterile fashion Exploration:    Hemostasis achieved with:  Direct pressure   Wound exploration: wound explored through full range of motion and entire depth of wound probed and visualized     Contaminated: no   Treatment:    Area cleansed with:  Saline Skin repair:    Repair method:  Sutures   Suture size:  4-0   Suture material:  Chromic gut   Suture technique:  Simple interrupted   Number of sutures:  4 Approximation:    Approximation:  Close Post-procedure details:    Dressing:  Open (no dressing)   Patient tolerance of procedure:  Tolerated well, no immediate complications    ED Course and Medical Decision Making  I have reviewed the triage vital signs, the nursing notes, and pertinent available records from the EMR.  Listed above are laboratory and imaging tests that I personally ordered, reviewed, and interpreted and then considered in my medical decision making (see below for details).      Syncope or near syncope likely due to sudden onset A. fib with RVR, patient with rates in the 120s here in the emergency department, did fall and strike his face against the ground, triggering a level 2 trauma alert.  No signs of any other injuries, will need laceration repair to the right upper lip.  Patient has missed a few doses of Eliquis over the past month and is therefore not an ideal cardioversion candidate.  May need admission for rate and/or rhythm control.  Work-up is overall reassuring, patient's rates down between 100 and  110 after small fluid bolus.  Laceration repaired as described above, there is no full-thickness injury and there is no involvement of the vermilion border.  Discussed case with Dr. Acie Fredrickson of cardiology, given patient's inconsistent compliance with anticoagulation his cardioversion will need to be delayed by few weeks, until then he can be further rate controlled with the addition of metoprolol twice daily.  Patient advised to follow-up closely with Dr. Ellyn Hack.  Barth Kirks. Sedonia Small, [place] [email or web address]  Final Clinical Impressions(s) / ED Diagnoses     ICD-10-CM   1. Fall, initial encounter  W19.XXXA   2. Near syncope  R55   3. Atrial fibrillation with RVR (HCC)  I48.91     ED Discharge Orders         Ordered    metoprolol tartrate (LOPRESSOR) 25 MG tablet  2 times daily     Discontinue  Reprint     06/30/20 1613           Discharge Instructions Discussed with and Provided to Patient:     Discharge Instructions     You were evaluated in the Emergency Department and after careful evaluation, we did not find any  emergent condition requiring admission or further testing in the hospital.  Your exam/testing today is overall reassuring.  Your symptoms seem to be due to A. fib with an elevated heart rate.  We advise that you follow-up with Dr. Ellyn Hack within the next few days to discuss your symptoms and the management of your A. fib.  Until then please take the metoprolol medication twice daily along with your other medications.  Your laceration was repaired with absorbable sutures and they do not need to be removed.  Please return to the Emergency Department if you experience any worsening of your condition.   Thank you for allowing [place] to be a part of your care.       Maudie Flakes, MD 06/30/20 [phone]

## 2020-06-30 NOTE — ED Notes (Signed)
Urine sent to lab WITH culture.

## 2020-07-03 ENCOUNTER — Other Ambulatory Visit: Payer: Self-pay | Admitting: Internal Medicine

## 2020-07-04 ENCOUNTER — Other Ambulatory Visit: Payer: Self-pay | Admitting: Internal Medicine

## 2020-07-04 DIAGNOSIS — R05 Cough: Secondary | ICD-10-CM

## 2020-07-04 DIAGNOSIS — R059 Cough, unspecified: Secondary | ICD-10-CM

## 2020-07-08 ENCOUNTER — Other Ambulatory Visit: Payer: Self-pay

## 2020-07-08 ENCOUNTER — Encounter: Payer: Self-pay | Admitting: Cardiology

## 2020-07-08 ENCOUNTER — Ambulatory Visit (INDEPENDENT_AMBULATORY_CARE_PROVIDER_SITE_OTHER): Payer: Medicare Other | Admitting: Cardiology

## 2020-07-08 VITALS — BP 126/70 | HR 59 | Ht 75.0 in | Wt 214.6 lb

## 2020-07-08 DIAGNOSIS — I480 Paroxysmal atrial fibrillation: Secondary | ICD-10-CM | POA: Diagnosis not present

## 2020-07-08 DIAGNOSIS — I10 Essential (primary) hypertension: Secondary | ICD-10-CM

## 2020-07-08 DIAGNOSIS — Z79899 Other long term (current) drug therapy: Secondary | ICD-10-CM

## 2020-07-08 DIAGNOSIS — E785 Hyperlipidemia, unspecified: Secondary | ICD-10-CM | POA: Diagnosis not present

## 2020-07-08 DIAGNOSIS — I25119 Atherosclerotic heart disease of native coronary artery with unspecified angina pectoris: Secondary | ICD-10-CM

## 2020-07-08 DIAGNOSIS — Z9861 Coronary angioplasty status: Secondary | ICD-10-CM

## 2020-07-08 DIAGNOSIS — I2510 Atherosclerotic heart disease of native coronary artery without angina pectoris: Secondary | ICD-10-CM

## 2020-07-08 DIAGNOSIS — I6523 Occlusion and stenosis of bilateral carotid arteries: Secondary | ICD-10-CM | POA: Diagnosis not present

## 2020-07-08 DIAGNOSIS — Z7902 Long term (current) use of antithrombotics/antiplatelets: Secondary | ICD-10-CM | POA: Diagnosis not present

## 2020-07-08 MED ORDER — METOPROLOL TARTRATE 25 MG PO TABS: 25.0000 mg | ORAL_TABLET | Freq: Two times a day (BID) | ORAL | 1 refills | Status: DC

## 2020-07-08 NOTE — Progress Notes (Signed)
Primary Care Provider: [name], MD Cardiologist: Charles Hew, MD  Gastroenterologist: Dr. Lucio Robinson Clinic Note: Chief Complaint  Patient presents with  . Hospitalization Follow-up    Fall  . Loss of Consciousness    Unclear if true loss of consciousness, but at least near syncope  . Atrial Fibrillation    Found to be in A. fib RVR  . Coronary Artery Disease    No angina    HPI:    Charles Robinson -"Charles Robinson" is a 74 y.o. male with a longstanding history of CAD dating back to 2004 (most notable for significant complication during PCI of OM leading to dissection of OM and circumflex-extensive PCI to both), PAF (on amiodarone with Eliquis.) along with carotid artery disease (s/p CEA -Dr. Trula Robinson) below who presents today for 73-monthfollow-up.  SIsabell Jarvisis a former patient of Dr. AAldona Barwho I first met in the Cath lab in Sept 2012 - -he has a long h/o CAD:  2004 (Dr. DAlbertine Robinson - PCI LAD & RCA  03/2011: routine Myoview -- Inferior Ischemia --> (Dr. LRex Robinson Cath: small RCA 100% CTO @ stent (L-R collaterals). LAD- prox stent patent, D1 ost 50%. OM1 subtotal TO just proximal to bifurcation--> bifurcation PCI/PTCA - 2.25 x 23 (2.5 mm) MinVision BMS into inferior branch.  Recurrent Angina - Abnormal Nuc ST (07/2011)-- PCI of OM1 ISR /thrombosis (recent OM1 PCI by Dr. LRex Kraswith  complicated by Guideliner related dissection involving a large portion of the Cx & OM1 -- essentially "full metal jacket" in Cx from prox to distal bifurcation & down the OM1 up to the previous stent.   ? Hospitalization complicated by Type 4a MI & Afib RVR -- DCCV.  03/15/2012: Abnormal Nuc ST again for CP --> PTCA of OM1 ostium & overlapping DES in OM (uncovered site) with BMS ISR PTCA & mCX PTCA.  Follow-up Myoview January 2014: No ischemia or infarction.  S/p L CEA - 2019 --> now with CTO of LICA on Carotid Dopplers 2021  --> Most recent history of a complicated by diagnosis of prostate cancer-he had  seed implantation and radiation plus hormonal therapy -> completed radiation therapy on May 28.  To make matters worse, he is now waiting to have a colonoscopy because of a positive Cologuard test   Charles MANISCALCOwas last seen by me on January 24, 2020.  He was doing fairly well from a cardiac standpoint.  No chest pain or dyspnea.  Walks about 2 miles a day, does not very fast heart rate usually controlled with no palpitations.  Starting to notice some fatigue also noticing some memory issues.  Was noted to be a little bit mentally slower in the sure himself.  Little more tremulous.  Was cleared to hold Eliquis and Plavix for procedures.   2D echo ordered to check a baseline  Discontinued Toprol in lieu of amiodarone. ->  Plan was to recheck PFTs sed rate and CRP  Recent Hospitalizations:   06/30/2020: ER visit -> fall.  (Syncope/near syncope) became very lightheaded and woozy near syncope, but did not clear if he actually passed out.  He did hit his face on the ground and had some significant bruising.  --> Was noted to be in A. fib RVR, unfortunately he had missed a few doses of Eliquis. ->  Heart rates improved with IV fluid bolus indicating likely hypotension.  Per discussion with on-call cardiologist, the decision for cardioversion was delayed.  He was started back on  metoprolol but 25 mg metoprolol tartrate twice daily with close follow-up scheduled.  Reviewed  CV studies:    The following studies were reviewed today: (if available, images/films reviewed: From Epic Chart or Care Everywhere) . Carotid Dopplers February 2021: Left ICA now CTO.  Less than 40% right ICA.  Normal bilateral vertebral arteries and subclavian arteries. . 2D Echo (02/17/2020): EF 55-60%.  Moderate LVH with GRII DD.  Mild elevated PA pressures with elevated CVP.  Severe LA dilation.  Severely calcified aortic valve with may be mild stenosis.  Mild aortic root dilation 40 mm.   Interval History:   Charles Robinson  "Charles Robinson" returns here today for cardiology follow-up feeling pretty worn out after completion of all of his radiation therapy and being on hormone therapy.  He is profoundly weak.  Has lost a ton of weight.  He is actually maybe now starting to eat a little more and putting some back on. He had not been having sensation of irregular heartbeats or palpitations that are prolonged until his episode on August 8 when he was found in A. fib RVR.[name]  He is not having chest pain or pressure, but is noticing worsening dyspnea with exertion and a chronic cough over the last for 5 months.  Not productive.  Just persistent.  He was switched from his ACE inhibitor to an ARB and noticed that that really has not made a change. -> His lisinopril was converted to olmesartan 20 mg because of cough.  He has been doing okay from an A. fib standpoint until July 01, 2019 presented with near syncope/syncope and found to be in A. fib RVR upon arrival to the ER.  Unfortunately, he had missed a few doses of Eliquis therefore was thought to be unsafe to consider cardioversion.-->was put back on metoprolol tartrate 25 mg twice daily.  CV Review of Symptoms (Summary): positive for - dyspnea on exertion, shortness of breath and Much more prominent fatigue.  Significant weight loss.  Fall related to probably either near syncope or true syncope--A. fib RVR.  (He did not feel tachycardia or irregular heartbeats. negative for - chest pain, edema, orthopnea, paroxysmal nocturnal dyspnea or No TIA or amaurosis fugax.  He did not feel his A. fib per se.  Not sure if he had syncope or not.  The patient DOES NOT have symptoms concerning for COVID-19 infection (fever, chills, cough, or new shortness of breath).  The patient is practicing social distancing & Masking.   Immunization History  Administered Date(s) Administered  . Influenza Whole 11/23/2005, 10/24/2007, 08/23/2009  . PFIZER SARS-COV-2 Vaccination 12/07/2019, 12/26/2019  .  Pneumococcal Polysaccharide-23 11/28/2009  . Td 11/23/1996, 10/23/2006  . Tdap 06/30/2020  . Zoster 11/27/2008    REVIEWED OF SYSTEMS   Review of Systems  Constitutional: Positive for malaise/fatigue (Significant fatigue) and weight loss (Significant).  HENT: Negative for congestion and nosebleeds.   Respiratory: Positive for cough and shortness of breath.   Cardiovascular: Negative for leg swelling.  Gastrointestinal: Negative for abdominal pain, blood in stool and melena.  Genitourinary: Negative for hematuria.  Musculoskeletal: Positive for falls (Noted in HPI) and joint pain (Normal arthritis pains).       He has lots of hip and leg pain from his fall as well as facial pain  Neurological: Positive for dizziness and weakness (Generalized weakness). Negative for sensory change, speech change, focal weakness and headaches.  Endo/Heme/Allergies: Bruises/bleeds easily (Significant bruising from his fall-right eye and cheek).  Psychiatric/Behavioral: Positive for memory  loss (A little bit). Negative for depression. The patient is nervous/anxious (Pretty anxious after this last fall). The patient does not have insomnia.    I have reviewed and (if needed) personally updated the patient's problem list, medications, allergies, past medical and surgical history, social and family history.   PAST MEDICAL HISTORY   Past Medical History:  Diagnosis Date  . Arthritis    back  . BPH (benign prostatic hypertrophy)   . CAD S/P multivessel PCI: LAD, RCA and extensive LCX-OM1 03/15/2012   s/p PCI to LAD, to RCA (now occluded), then extensive PCI to Dominant LCx-OM1  (enitre prox-AVG Circ for dissection in 07/2011)  . Cancer ([place])    SKIN CA  . GERD (gastroesophageal reflux disease)   . History of colon polyps   . History of echocardiogram 08/16/2011   Echo - EF 60-65%; moderate LV concentric hypertrophy; abdnormal LV relaxation (grade 1 diastolic dysfunction; ascending aorta mildly dilated; LA  moderately dilated;   . Hyperlipidemia   . Hypertension   . 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 PAF (paroxysmal atrial fibrillation) ([place]) 12/20/2012   followed by Dr. Glenetta Robinson; s/p DCCV 2011, 07/2011 post PCI with Type 4a MI; CHA2DS2Vasc = 3, on Warfarin  . Paroxysmal atrial flutter ([place]) 08/20/2011   TEE- atrial septum - no defect identified; RA normal in size, no evidence of thrombus; ascending aorta normal  . Prostate cancer ([place])   . Rupture quadriceps tendon     PAST SURGICAL HISTORY   Past Surgical History:  Procedure Laterality Date  . BACK SURGERY    . COLONOSCOPY    . CORONARY ANGIOPLASTY WITH STENT PLACEMENT  03/15/2012   multiple stens in AVGroove Circ & OM1;; 2 site PTCA w/ PTCA and stenting of OM2 ang PTCA of distal stent ISR followed by PTCA of mid LCx ISR  . CORONARY STENT PLACEMENT  2003 - 2011 - 2012 - 2013   Pre-2012 - BMS in RCA now known occluded, BMS in proximal LAD; 07/2011 - 2.25 x 23 BMS in OM 1 with significant disease on either side noted shortly after -->  2 additional overlapping DES 2.5 mm x 30 mm and 2.25 mm x 26 mm in OM1,; 3 overlapping Resolute DES in AV groove Cx crossing OM1: (Tapered from 3.8-2.6 mm)- Resolute DES 3.5 x 22, 3.0 x 38, 2.5 x 14  . DOPPLER ECHOCARDIOGRAPHY  07/2011   EF ~60-65%, Grade 1 D Dysfxn; mod Conc LVH  . ENDARTERECTOMY Left 02/27/2016   Procedure: LEFT  CAROTID ARTERY ENDARTERECTOMY ;  Surgeon: Serafina Mitchell, MD;  Location: [facility];  Service: Vascular;  Laterality: Left;  . LEFT HEART CATHETERIZATION WITH CORONARY ANGIOGRAM N/A 03/15/2012   Procedure: LEFT HEART CATHETERIZATION WITH CORONARY ANGIOGRAM;  Surgeon: [name], MD;  Location: [facility] CATH LAB: patent LAD stents, patent LCx stents w/80% focal ISR just distal to OM; OM proximal stent open; -- 2 site PTCA-PCI  . LUMBAR LAMINECTOMY/DECOMPRESSION MICRODISCECTOMY Bilateral 12/11/2014    Procedure: Bilateral Lumbar Three-Four Laminectomy;  Surgeon: Kristeen Miss, MD;  Location: [facility];  Service: Neurosurgery;  Laterality: Bilateral;  bilateral  . NM MYOVIEW LTD  11/2012   No ischemia or infarction, normal EF & WM  . PATCH ANGIOPLASTY Left 02/27/2016   Procedure: WITH HEMASHIELD DACRON  PATCH ANGIOPLASTY;  Surgeon: Serafina Mitchell, MD;  Location: [facility];  Service: Vascular;  Laterality: Left;  [name] QUADRICEPS  TENDON REPAIR  09/19/2012   Procedure: REPAIR QUADRICEP TENDON;  Surgeon: [name], MD;  Location: [facility];  Service: Orthopedics;  Laterality: Left;  [name] VASECTOMY      MEDICATIONS/ALLERGIES   Current Meds  Medication Sig  . clopidogrel (PLAVIX) 75 MG tablet TAKE 1 TABLET BY MOUTH EVERY DAY  . Coenzyme Q10 200 MG capsule Take 200 mg by mouth every morning.   [name] ELIQUIS 5 MG TABS tablet TAKE 1 TABLET BY MOUTH TWICE A DAY  . ezetimibe (ZETIA) 10 MG tablet TAKE 1 TABLET BY MOUTH EVERY DAY  . finasteride (PROSCAR) 5 MG tablet Take 5 mg by mouth every evening.   [name] Leuprolide Acetate (ELIGARD [place]) Inject into the skin.  . Multiple Vitamin (MULTIVITAMIN WITH MINERALS) TABS tablet Take 1 tablet by mouth every morning.   . nitroGLYCERIN (NITROSTAT) 0.4 MG SL tablet PLACE 1 TABLET (0.4 MG TOTAL) UNDER THE TONGUE EVERY 5 (FIVE) MINUTES AS NEEDED. FOR CHEST PAIN  . Omega-3 Fatty Acids (FISH OIL) 1000 MG CAPS Take 1,000 capsules by mouth 2 (two) times daily.   . rosuvastatin (CRESTOR) 40 MG tablet TAKE 1 TABLET BY MOUTH EVERY DAY  . tamsulosin (FLOMAX) 0.4 MG CAPS capsule Take 0.4 mg by mouth daily.  . [DISCONTINUED] amiodarone (PACERONE) 200 MG tablet TAKE 1 TABLET BY MOUTH EVERY DAY  . [DISCONTINUED] lisinopril (ZESTRIL) 5 MG tablet Take 1 tablet (5 mg total) by mouth daily.  . [DISCONTINUED] metoprolol tartrate (LOPRESSOR) 25 MG tablet Take 1 tablet (25 mg total) by mouth 2 (two) times daily.  . [DISCONTINUED] metoprolol tartrate (LOPRESSOR) 25 MG tablet Take 1 tablet (25 mg  total) by mouth 2 (two) times daily.    Allergies  Allergen Reactions  . Lipitor [Atorvastatin] Other (See Comments)    Increased HR is currently tolerating --   . Morphine And Related Nausea And Vomiting  . Vicodin [Hydrocodone-Acetaminophen] Other (See Comments)    Feel crazy    SOCIAL HISTORY/FAMILY HISTORY   Reviewed in Epic:  Pertinent findings: -> No new changes  OBJCTIVE -PE, EKG, labs   Wt Readings from Last 3 Encounters:  07/08/20 214 lb 9.6 oz (97.3 kg)  06/30/20 210 lb (95.3 kg)  06/27/20 215 lb (97.5 kg)  05/2018 - 263 lb -> February 2021 2047 LB--March 243 LB  Physical Exam: BP 126/70   Pulse (!) 59   Ht _0  (1.905 m)   Wt 214 lb 9.6 oz (97.3 kg)   BMI 26.82 kg/m  Physical Exam Constitutional:      General: He is not in acute distress.    Appearance: He is well-developed. He is ill-appearing (Chronically, not acute/toxic.  Just looks more fatigued and not as well presented.).     Comments: He does seem older than last time I saw him.  Little slower talking and walking.  Has lost significant weight.  HENT:     Head: Normocephalic.     Comments: Very significant facial bruising Neck:     Vascular: No carotid bruit (No left-sided bruit heard), hepatojugular reflux or JVD.  Cardiovascular:     Rate and Rhythm: Regular rhythm. Bradycardia present.  No extrasystoles are present.    Chest Wall: PMI is not displaced.     Pulses: Decreased pulses (Minimally diminished pedal pulses).     Heart sounds: S1 normal and S2 normal. Heart sounds are distant. Murmur (1-2/6 SEM at RUSB.) heard.  No friction rub. No gallop.   Pulmonary:  Effort: Pulmonary effort is normal. No respiratory distress.     Breath sounds: No wheezing.     Comments: Mild interstitial sounds but no obvious rales or rhonchi. Abdominal:     General: Abdomen is flat. Bowel sounds are normal. There is no distension.     Palpations: Abdomen is soft.     Tenderness: There is no abdominal  tenderness. There is no rebound.  Musculoskeletal:        General: No swelling. Normal range of motion.     Cervical back: Normal range of motion and neck supple.  Skin:    Findings: Bruising (Arms and face from fall) present.  Neurological:     General: No focal deficit present.     Mental Status: He is alert and oriented to person, place, and time.  Psychiatric:        Behavior: Behavior normal.        Judgment: Judgment normal.     Comments: Somewhat flat mood and affect.  Still little slower response, but much better than last visit.  Still tremulous, not as much.  Speech is sharper than last visit.  He did defer more to his wife however (she was not here last visit).      Adult ECG Report  Rate: 59;  Rhythm: sinus bradycardia and Normal axis, intervals and durations.;   Narrative Interpretation: Heart rate faster, otherwise stable  Recent Labs:   May 21, 2020: TC 142, TG 115, HDL 40, LDL 79. Lab Results  Component Value Date   CHOL 132 08/15/2019   HDL 46 08/15/2019   LDLCALC 71 08/15/2019   TRIG 78 08/15/2019   CHOLHDL 2.9 08/15/2019   CBC Latest Ref Rng & Units 06/30/2020 02/28/2016 02/21/2016  WBC 4.0 - 10.5 K/uL 5.7 5.7 4.6  Hemoglobin 13.0 - 17.0 g/dL 11.6(L) 12.1(L) 15.0  Hematocrit 39 - 52 % 37.1(L) 36.9(L) 44.4  Platelets 150 - 400 K/uL 214 126(L) 109(L)    Lab Results  Component Value Date   CREATININE 1.46 (H) 06/30/2020   BUN 21 06/30/2020   NA 141 06/30/2020   K 4.2 06/30/2020   CL 104 06/30/2020   CO2 25 06/30/2020   Lab Results  Component Value Date   TSH 2.720 01/24/2020    ASSESSMENT/PLAN    Problem List Items Addressed This Visit    CAD, s/p multiple PCIs (Chronic)    Multiple stents in the LCx-OM territory along with LAD and RCA PCI.  We are far enough out now to stop Plavix in lieu of him being on Eliquis.  Concerned with recent fall and generalized weakness following the cancer treatment. He also will probably require colonoscopy etc. for  his positive Cologuard..      Dyslipidemia, goal LDL below 70 (Chronic)    Unfortunately, despite having added Zetia to rosuvastatin, his lipids went the wrong direction. ->  This could simply be change in lab or diet.  Interestingly I would have expected with his weight loss that the labs are good the other direction.  For now I think we will simply continue current regimen.  This can be addressed in the future once we answer the question about his dyspnea, and cough.      Essential hypertension (Chronic)    He has had blood pressure go up and down.  Now that he is done his cancer treatments and starting to feel better, his blood pressures are drifting up.  His ACE inhibitor was restarted, and subsequently stopped with conversion  to an ARB.  He is now back on a beta-blocker. Because of his bradycardia and fatigue/cough I would just now stop amiodarone.  Hopefully we will see stable blood pressures.  But I have little bit concerned about his fall in the setting of A. fib RVR.    Thankfully, he is on a very low-dose of olmesartan.        PAF (paroxysmal atrial fibrillation) (HCC) CHA2DS2-VASc score-4 (age-75, HTN, MI/CAD) - Primary (Chronic)    Unfortunately, he now has had a breakthrough of A. fib RVR that probably led to his either syncope or near syncope.  Thankfully, he spontaneously converted back into sinus bradycardia, so we do not need to consider cardioversion.  He does remain on Eliquis, and is trying to do a better job of remembering to take it.  He was restarted on metoprolol tartrate--which contradicts my decision to have him stop his Toprol for fatigue.  However now we have the issue of this persistent cough that has been having and I am very concerned that this could be related to amiodarone toxicity.  Plan:   Continue Eliquis hopefully not missing doses.  We will have him stop Plavix for fear of worse bleeding with fall.  Okay to hold Eliquis 2 to 3 days preop for  surgeries or procedures, otherwise try not to miss dose.  With his persistent cough, I am concerned about amiodarone toxicity.    Plan: stop amiodarone and continue metoprolol tartrate (using tartrate as opposed to succinate will allow him to potentially hold the dose if necessary for fatigue, or even potentially take another dose for tachycardia)      Relevant Orders   EKG 12-Lead (Completed)   Coronary artery disease involving native coronary artery of native heart with angina pectoris (HCC) (Chronic)    Extensive PCI in the circumflex distribution both the major first OM branch and the mid circumflex leading up to the next OM branch along with previous PCI to the LAD and RCA.  He is now 8 + years out from his last stent.  Plan:  We can DC Plavix at this point since he is on Eliquis.  Restarted Lopressor 25 mg twice daily with plans to stop amiodarone  Continue current dose of ARB provided his blood pressures are stable.  Continue statin plus Zetia. ->  Need to wait for him to stabilize out some after his treatments before we recheck lipids.      On amiodarone therapy (Chronic)    He never did get PFTs done at last visit  - > we can probably check this fall, but I am planning on try to see how he does off of amiodarone for fear of potential toxicity with this cough.  Reassuring factor is that his CRP and ESR were relatively normal when checked in March.  It would be nice to build to have the possibility of using amiodarone on an as-needed basis in the future.  If he still having dyspnea and cough in his follow-up visit, I would then recheck PFTs and likely high-res chest CT scan.      Antiplatelet or antithrombotic long-term use (Chronic)    He has been doing well with Eliquis and Plavix, but now with his recent fall and near syncope and generalized weakness following his cancer treatment I think we can stop Plavix and stay with Eliquis alone.Faythe Ghee to hold Eliquis 2 to 3  days preop for procedures or surgeries.  (3 days for any neurologic  procedures 2 days from the procedures.) ->  After July 31, 2020  He does have an upcoming colonoscopy planned it would be best for her to wait at least a month from his most recent A. fib encounter which would be from August 8-that would mean delaying till mid-September.  This way the spontaneous conversion would not be associated with increased stroke risk.        This patients CHA2DS2-VASc Score and unadjusted Ischemic Stroke Rate (% per year) is equal to 4.8 % stroke rate/year from a score of 4  Above score calculated as 1 point each if present [CHF, HTN, DM, Vascular=MI/PAD/Aortic Plaque, Age if 65-74, or Male] Above score calculated as 2 points each if present [Age > 75, or Stroke/TIA/TE]   COVID-19 Education: The signs and symptoms of COVID-19 were discussed with the patient and how to seek care for testing (follow up with PCP or arrange E-visit).   The importance of social distancing was discussed today.  I spent a total of 28 minutes with the patient. >  50% of the time was spent in direct patient consultation.  Additional time spent with chart review  / charting (studies, outside notes, etc): 20 Total Time: 48 min   Current medicines are reviewed at length with the patient today.  (+/- concerns) n/a - .  A little bit concerned about slow heart rate.  Stopping beta-blocker now.   Patient Instructions / Medication Changes & Studies & Tests Ordered   Patient Instructions  Medication Instructions:  Stop Amiodarone  Stop Plavix  *If you need a refill on your cardiac medications before your next appointment, please call your pharmacy*  Follow-Up: At [facility], you and your health needs are our priority.  As part of our continuing mission to provide you with exceptional heart care, we have created designated Provider Care Teams.  These Care Teams include your primary Cardiologist (physician) and  Advanced Practice Providers (APPs -  Physician Assistants and Nurse Practitioners) who all work together to provide you with the care you need, when you need it.  We recommend signing up for the patient portal called "MyChart".  Sign up information is provided on this After Visit Summary.  MyChart is used to connect with patients for Virtual Visits (Telemedicine).  Patients are able to view lab/test results, encounter notes, upcoming appointments, etc.  Non-urgent messages can be sent to your provider as well.   To learn more about what you can do with MyChart, go to [email or web address].    Your next appointment:   4 month(s)  The format for your next appointment:   In Person  Provider:   Glenetta Hew, MD        Studies Ordered:   Orders Placed This Encounter  Procedures  . EKG 12-Lead     Charles Robinson, M.D., M.S. Interventional Cardiologist   Pager # [phone] Phone # [phone] [address] [place], [place] [address]   Thank you for choosing Heartcare at [facility]!!

## 2020-07-08 NOTE — Patient Instructions (Signed)
Medication Instructions:  Stop Amiodarone  Stop Plavix  *If you need a refill on your cardiac medications before your next appointment, please call your pharmacy*  Follow-Up: At [facility], you and your health needs are our priority.  As part of our continuing mission to provide you with exceptional heart care, we have created designated Provider Care Teams.  These Care Teams include your primary Cardiologist (physician) and Advanced Practice Providers (APPs -  Physician Assistants and Nurse Practitioners) who all work together to provide you with the care you need, when you need it.  We recommend signing up for the patient portal called "MyChart".  Sign up information is provided on this After Visit Summary.  MyChart is used to connect with patients for Virtual Visits (Telemedicine).  Patients are able to view lab/test results, encounter notes, upcoming appointments, etc.  Non-urgent messages can be sent to your provider as well.   To learn more about what you can do with MyChart, go to [email or web address].    Your next appointment:   4 month(s)  The format for your next appointment:   In Person  Provider:   Glenetta Hew, MD

## 2020-07-09 ENCOUNTER — Telehealth: Payer: Self-pay | Admitting: Cardiology

## 2020-07-09 MED ORDER — METOPROLOL TARTRATE 25 MG PO TABS: 25.0000 mg | ORAL_TABLET | Freq: Two times a day (BID) | ORAL | 3 refills | Status: DC

## 2020-07-09 NOTE — Telephone Encounter (Signed)
[facility] 8/17

## 2020-07-09 NOTE — Telephone Encounter (Signed)
Called and spoke with pt. He states he had an appt with Dr.Harding yesterday and he was supposed to put him on a "different metoprolol" than what he had for 15 days.  Notified that in his chart it states he is taking metoprolol tartrate 25mg  twice a day. Pt states this is correct and that Dr.Harding was supposed to change this to a different metoprolol. Pt also states he is not taking his lisinopril. Notified I would send this message to Dr. Ellyn Hack for clarification on what medication he should be taking. Pt verbalized understanding with no other questions at this time.

## 2020-07-09 NOTE — Telephone Encounter (Signed)
Called and spoke with pt, notified that per Dr.Harding he was to stop his Amiodaron and continue his metoprolol tartrate 25mg  twice a day.  Pt verbalized he not taking the lisinopril. He reports he is taking Olmesartan 20mg  daily, not losartan 25mg .  Again reiterated to stop his amiodarone, continue his metoprolol tartrate 25mg  twice daily, and notified I would let Dr.Harding know he is on olmesartan not losartan.  Pt verbalized understanding and states he would like a 90 day supply of the metoprolol sent to his pharmacy. No other questions at this time.

## 2020-07-09 NOTE — Telephone Encounter (Signed)
Medication Changes placed on patient medication list

## 2020-07-09 NOTE — Telephone Encounter (Signed)
Patient is returning call.  °

## 2020-07-09 NOTE — Telephone Encounter (Signed)
New Message  Pt c/o medication issue:  1. Name of Medication: metoprolol tartrate (LOPRESSOR) 25 MG tablet  2. How are you currently taking this medication (dosage and times per day)? Has not started  3. Are you having a reaction (difficulty breathing--STAT)? No  4. What is your medication issue? Patient is calling in to follow up about his medication. Please call back.

## 2020-07-09 NOTE — Telephone Encounter (Signed)
Plan was to stop Amiodarone & continue Metoprolol Tartrate (Lopressor) ~ 25 mg BID.  I mentioned that this is a different formulation than he had previously been on, before we had stopped it -- he had been on Toprol  (Metoprolol Succinate) 25 mg daily  I understood that he is not taking Lisinopril - he thought he was changed to Losartan 25 mg (is that correct?)  Glenetta Hew, MD

## 2020-07-11 ENCOUNTER — Other Ambulatory Visit: Payer: Self-pay

## 2020-07-11 ENCOUNTER — Ambulatory Visit
Admission: RE | Admit: 2020-07-11 | Discharge: 2020-07-11 | Disposition: A | Discharge status: Home / Self Care | Payer: Medicare Other | Source: Ambulatory Visit | Attending: Internal Medicine | Admitting: Internal Medicine

## 2020-07-11 ENCOUNTER — Encounter: Payer: Medicare Other | Admitting: Gastroenterology

## 2020-07-11 ENCOUNTER — Other Ambulatory Visit: Payer: Medicare Other

## 2020-07-11 DIAGNOSIS — R059 Cough, unspecified: Secondary | ICD-10-CM

## 2020-07-11 DIAGNOSIS — R05 Cough: Secondary | ICD-10-CM

## 2020-07-12 ENCOUNTER — Telehealth: Payer: Self-pay | Admitting: Cardiology

## 2020-07-12 ENCOUNTER — Other Ambulatory Visit (HOSPITAL_COMMUNITY): Payer: Self-pay | Admitting: Internal Medicine

## 2020-07-12 ENCOUNTER — Encounter: Payer: Self-pay | Admitting: Cardiology

## 2020-07-12 ENCOUNTER — Other Ambulatory Visit: Payer: Self-pay | Admitting: Internal Medicine

## 2020-07-12 DIAGNOSIS — R918 Other nonspecific abnormal finding of lung field: Secondary | ICD-10-CM

## 2020-07-12 NOTE — Telephone Encounter (Signed)
Patient's wife, [name], is calling to inform Dr. Ellyn Hack that the cause of the patients cough is not from the amiodarone as previously thought to be. 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 [name] wants to know do they go back to the amiodarone since it wasn't the cause of the cough or stay with the new medication? Please advise.

## 2020-07-12 NOTE — Telephone Encounter (Signed)
Spoke with pt wife, the mass was found on a CT scan and the patient is having a PET scan next. The wife reports it does not look good. Aware dr Ellyn Hack is not in the office but will forward to him about restarting the amiodarone.

## 2020-07-12 NOTE — Assessment & Plan Note (Addendum)
Unfortunately, despite having added Zetia to rosuvastatin, his lipids went the wrong direction. ->  This could simply be change in lab or diet.  Interestingly I would have expected with his weight loss that the labs are good the other direction.  For now I think we will simply continue current regimen.  This can be addressed in the future once we answer the question about his dyspnea, and cough.

## 2020-07-12 NOTE — Assessment & Plan Note (Signed)
Extensive PCI in the circumflex distribution both the major first OM branch and the mid circumflex leading up to the next OM branch along with previous PCI to the LAD and RCA.  He is now 8 + years out from his last stent.  Plan:  We can DC Plavix at this point since he is on Eliquis.  Restarted Lopressor 25 mg twice daily with plans to stop amiodarone  Continue current dose of ARB provided his blood pressures are stable.  Continue statin plus Zetia. ->  Need to wait for him to stabilize out some after his treatments before we recheck lipids.

## 2020-07-12 NOTE — Assessment & Plan Note (Signed)
He never did get PFTs done at last visit  - > we can probably check this fall, but I am planning on try to see how he does off of amiodarone for fear of potential toxicity with this cough.  Reassuring factor is that his CRP and ESR were relatively normal when checked in March.  It would be nice to build to have the possibility of using amiodarone on an as-needed basis in the future.  If he still having dyspnea and cough in his follow-up visit, I would then recheck PFTs and likely high-res chest CT scan.

## 2020-07-12 NOTE — Assessment & Plan Note (Signed)
Multiple stents in the LCx-OM territory along with LAD and RCA PCI.  We are far enough out now to stop Plavix in lieu of him being on Eliquis.  Concerned with recent fall and generalized weakness following the cancer treatment. He also will probably require colonoscopy etc. for his positive Cologuard.Charles Robinson

## 2020-07-12 NOTE — Assessment & Plan Note (Signed)
Unfortunately, he now has had a breakthrough of A. fib RVR that probably led to his either syncope or near syncope.  Thankfully, he spontaneously converted back into sinus bradycardia, so we do not need to consider cardioversion.  He does remain on Eliquis, and is trying to do a better job of remembering to take it.  He was restarted on metoprolol tartrate--which contradicts my decision to have him stop his Toprol for fatigue.  However now we have the issue of this persistent cough that has been having and I am very concerned that this could be related to amiodarone toxicity.  Plan:   Continue Eliquis hopefully not missing doses.  We will have him stop Plavix for fear of worse bleeding with fall.  Okay to hold Eliquis 2 to 3 days preop for surgeries or procedures, otherwise try not to miss dose.  With his persistent cough, I am concerned about amiodarone toxicity.    Plan: stop amiodarone and continue metoprolol tartrate (using tartrate as opposed to succinate will allow him to potentially hold the dose if necessary for fatigue, or even potentially take another dose for tachycardia)

## 2020-07-12 NOTE — Assessment & Plan Note (Signed)
He has had blood pressure go up and down.  Now that he is done his cancer treatments and starting to feel better, his blood pressures are drifting up.  His ACE inhibitor was restarted, and subsequently stopped with conversion to an ARB.  He is now back on a beta-blocker. Because of his bradycardia and fatigue/cough I would just now stop amiodarone.  Hopefully we will see stable blood pressures.  But I have little bit concerned about his fall in the setting of A. fib RVR.    Thankfully, he is on a very low-dose of olmesartan.

## 2020-07-12 NOTE — Assessment & Plan Note (Addendum)
He has been doing well with Eliquis and Plavix, but now with his recent fall and near syncope and generalized weakness following his cancer treatment I think we can stop Plavix and stay with Eliquis alone.Faythe Ghee to hold Eliquis 2 to 3 days preop for procedures or surgeries.  (3 days for any neurologic procedures 2 days from the procedures.) ->  After July 31, 2020  He does have an upcoming colonoscopy planned it would be best for her to wait at least a month from his most recent A. fib encounter which would be from August 8-that would mean delaying till mid-September.  This way the spontaneous conversion would not be associated with increased stroke risk.

## 2020-07-14 NOTE — Telephone Encounter (Signed)
Can go back on Amio - but @ 1/2 tab daily  Glenetta Hew, MD

## 2020-07-15 ENCOUNTER — Other Ambulatory Visit: Payer: Self-pay

## 2020-07-15 ENCOUNTER — Encounter: Payer: Self-pay | Admitting: Surgery

## 2020-07-15 ENCOUNTER — Ambulatory Visit (HOSPITAL_COMMUNITY)
Admission: RE | Admit: 2020-07-15 | Discharge: 2020-07-15 | Disposition: A | Discharge status: Home / Self Care | Payer: Medicare Other | Source: Ambulatory Visit | Attending: Surgery | Admitting: Surgery

## 2020-07-15 ENCOUNTER — Ambulatory Visit (INDEPENDENT_AMBULATORY_CARE_PROVIDER_SITE_OTHER): Payer: Medicare Other | Admitting: Surgery

## 2020-07-15 VITALS — BP 98/65 | HR 84 | Temp 98.3°F | Resp 20 | Ht 75.0 in | Wt 213.5 lb

## 2020-07-15 DIAGNOSIS — I6523 Occlusion and stenosis of bilateral carotid arteries: Secondary | ICD-10-CM

## 2020-07-15 MED ORDER — AMIODARONE HCL 200 MG PO TABS
100.0000 mg | ORAL_TABLET | Freq: Every day | ORAL | 3 refills | Status: DC
Start: 2020-07-15 — End: 2020-08-05

## 2020-07-15 NOTE — Telephone Encounter (Signed)
Thank you for the follow-up.  I am sad to hear that Charles Robinson has yet again another diagnosis that is concerning.  I was hoping that was not to be the issue.  I think it would be fine being on half dose of amiodarone though.  Glenetta Hew, MD

## 2020-07-15 NOTE — Telephone Encounter (Signed)
Spoke with pt wife, aware of dr harding's recommendations. 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 She wanted to thank dr harding for seeing them and they will keep [place] updated.

## 2020-07-15 NOTE — Progress Notes (Signed)
[facility]ist [facility]  Patient name: Charles Robinson MRN: [id number] DOB: [birth year] Sex: male   REASON FOR VISIT:    Follow up  [place]:    Charles Uy Lackeyis a [phone].[email or web address] returns today for follow-up. He is status post left carotid endarterectomy on 02/27/2016. This was done for asymptomatic stenosis. The lesion and plaque extended up to the skull base. There is also a significant inflammatory reaction around the internal carotid artery. I could not place a shunt because of how distal the lesion was. He tolerated his operation without any significant difficulties.  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 He denies any neurologic issues. He has not had any episodes of numbness or weakness or slurred speech. He states that he has some swallowing difficulties however these have been persistent.  He has been diagnosed with prostate cancer and is to have radiation in the near future  He has a history of CAD, s/p PCI on lifelong Plavix.  He has a history of AFIB, on Amiodarone and Eliquis.  He is medically managed for hypertension.  He takes a statin for hypercholesterolemia.   PAST MEDICAL HISTORY:   Past Medical History:  Diagnosis Date  . Arthritis    back  . BPH (benign prostatic hypertrophy)   . CAD S/P multivessel PCI: LAD, RCA and extensive LCX-OM1 03/15/2012   s/p PCI to LAD, to RCA (now occluded), then extensive PCI to Dominant LCx-OM1  (enitre prox-AVG Circ for dissection in 07/2011)  . Cancer ([place])    SKIN CA  . GERD (gastroesophageal reflux disease)   . History of colon polyps   . History of echocardiogram 08/16/2011   Echo - EF 60-65%; moderate LV concentric hypertrophy; abdnormal LV relaxation (grade 1 diastolic dysfunction; ascending aorta mildly dilated; LA moderately dilated;   .  Hyperlipidemia   . Hypertension   . 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 PAF (paroxysmal atrial fibrillation) ([place]) 12/20/2012   followed by Dr. Glenetta Hew; s/p DCCV 2011, 07/2011 post PCI with Type 4a MI; CHA2DS2Vasc = 3, on Warfarin  . Paroxysmal atrial flutter ([place]) 08/20/2011   TEE- atrial septum - no defect identified; RA normal in size, no evidence of thrombus; ascending aorta normal  . Prostate cancer ([place])   . Rupture quadriceps tendon      FAMILY HISTORY:   Family History  Problem Relation Age of Onset  . Leukemia Mother   . Heart disease Father   . Bladder Cancer Sister   . Prostate cancer Brother   . Breast cancer Maternal Grandmother     SOCIAL HISTORY:   Social History   Tobacco Use  . Smoking status: Never Smoker  . Smokeless tobacco: Never Used  Substance Use Topics  . Alcohol use: Yes    Alcohol/week: 2.0 standard drinks    Types: 2 Cans of beer per week    Comment: rare     ALLERGIES:   Allergies  Allergen Reactions  . Lipitor [Atorvastatin] Other (See Comments)    Increased HR is currently tolerating --   . Morphine And Related Nausea And Vomiting  . Vicodin [Hydrocodone-Acetaminophen] Other (See Comments)    Feel crazy     CURRENT MEDICATIONS:   Current Outpatient Medications  Medication Sig Dispense Refill  .  amiodarone (PACERONE) 200 MG tablet Take 0.5 tablets (100 mg total) by mouth daily. 90 tablet 3  . Coenzyme Q10 200 MG capsule Take 200 mg by mouth every morning.     [name] ELIQUIS 5 MG TABS tablet TAKE 1 TABLET BY MOUTH TWICE A DAY 180 tablet 2  . ezetimibe (ZETIA) 10 MG tablet TAKE 1 TABLET BY MOUTH EVERY DAY 90 tablet 2  . finasteride (PROSCAR) 5 MG tablet Take 5 mg by mouth every evening.     [name] Leuprolide Acetate (ELIGARD [facility]) Inject into the skin.    . metoprolol tartrate (LOPRESSOR) 25 MG tablet Take 1 tablet (25 mg total) by mouth 2 (two)  times daily. 180 tablet 3  . Multiple Vitamin (MULTIVITAMIN WITH MINERALS) TABS tablet Take 1 tablet by mouth every morning.     . nitroGLYCERIN (NITROSTAT) 0.4 MG SL tablet PLACE 1 TABLET (0.4 MG TOTAL) UNDER THE TONGUE EVERY 5 (FIVE) MINUTES AS NEEDED. FOR CHEST PAIN 25 tablet 4  . olmesartan (BENICAR) 20 MG tablet Take 20 mg by mouth daily.    . Omega-3 Fatty Acids (FISH OIL) 1000 MG CAPS Take 1,000 capsules by mouth 2 (two) times daily.     . rosuvastatin (CRESTOR) 40 MG tablet TAKE 1 TABLET BY MOUTH EVERY DAY 90 tablet 1  . tamsulosin (FLOMAX) 0.4 MG CAPS capsule Take 0.4 mg by mouth daily.    . VENTOLIN HFA 108 (90 Base) MCG/ACT inhaler Inhale into the lungs.     No current facility-administered medications for this visit.    REVIEW OF SYSTEMS:   [X]  denotes positive finding, [ ]  denotes negative finding Cardiac  Comments:  Chest pain or chest pressure:    Shortness of breath upon exertion:    Short of breath when lying flat:    Irregular heart rhythm:        Vascular    Pain in calf, thigh, or hip brought on by ambulation:    Pain in feet at night that wakes you up from your sleep:     Blood clot in your veins:    Leg swelling:         Pulmonary    Oxygen at home:    Productive cough:     Wheezing:         Neurologic    Sudden weakness in arms or legs:     Sudden numbness in arms or legs:     Sudden onset of difficulty speaking or slurred speech:    Temporary loss of vision in one eye:     Problems with dizziness:         Gastrointestinal    Blood in stool:     Vomited blood:         Genitourinary    Burning when urinating:     Blood in urine:        Psychiatric    Major depression:         Hematologic    Bleeding problems:    Problems with blood clotting too easily:        Skin    Rashes or ulcers:        Constitutional    Fever or chills:      PHYSICAL EXAM:   Vitals:   07/15/20 1432 07/15/20 1434  BP: 105/70 98/65  Pulse: 84   Resp: 20     Temp: 98.3 F (36.8 C)   TempSrc: Temporal   SpO2: 95%   Weight: 213  lb 8 oz (96.8 kg)   Height: 6\' 3"  (1.905 m)     GENERAL: The patient is a well-nourished male, in no acute distress. The vital signs are documented above. CARDIAC: There is a regular rate and rhythm.  PULMONARY: Non-labored respirations ABDOMEN: Soft and non-tender with normal pitched bowel sounds.  MUSCULOSKELETAL: There are no major deformities or cyanosis. NEUROLOGIC: No focal weakness or paresthesias are detected. SKIN: There are no ulcers or rashes noted. PSYCHIATRIC: The patient has a normal affect.  STUDIES:   I have reviewed the following Right Carotid: Velocities in the right ICA are consistent with a 1-39%  stenosis.   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        Non-hemodynamically significant plaque <50% noted in the  CCA.   Vertebrals: Bilateral vertebral arteries demonstrate antegrade flow.  Subclavians: Normal flow hemodynamics were seen in bilateral subclavian        arteries.  MEDICAL ISSUES:   Follow up carotid duplex in 1 year    Leia Alf, MD, FACS [facility] [phone] Pager [phone]

## 2020-07-16 ENCOUNTER — Other Ambulatory Visit (HOSPITAL_COMMUNITY): Payer: Self-pay | Admitting: Internal Medicine

## 2020-07-16 ENCOUNTER — Encounter: Payer: Self-pay | Admitting: *Deleted

## 2020-07-16 ENCOUNTER — Ambulatory Visit (HOSPITAL_COMMUNITY)
Admission: RE | Admit: 2020-07-16 | Discharge: 2020-07-16 | Disposition: A | Discharge status: Home / Self Care | Payer: Medicare Other | Source: Ambulatory Visit | Attending: Internal Medicine | Admitting: Internal Medicine

## 2020-07-16 ENCOUNTER — Other Ambulatory Visit: Payer: Self-pay | Admitting: Internal Medicine

## 2020-07-16 ENCOUNTER — Telehealth: Payer: Self-pay | Admitting: Internal Medicine

## 2020-07-16 ENCOUNTER — Telehealth: Payer: Self-pay | Admitting: *Deleted

## 2020-07-16 DIAGNOSIS — R918 Other nonspecific abnormal finding of lung field: Secondary | ICD-10-CM | POA: Diagnosis present

## 2020-07-16 DIAGNOSIS — J90 Pleural effusion, not elsewhere classified: Secondary | ICD-10-CM

## 2020-07-16 LAB — GLUCOSE, CAPILLARY: Glucose-Capillary: 104 mg/dL — ABNORMAL HIGH (ref 70–99)

## 2020-07-16 MED ORDER — FLUDEOXYGLUCOSE F - 18 (FDG) INJECTION
11.2000 | Freq: Once | INTRAVENOUS | Status: AC | PRN
  Administered 2020-07-16: 11.2 via INTRAVENOUS

## 2020-07-16 NOTE — Progress Notes (Signed)
I received referral on Mr. Guidroz today.  I notified Rad Onc to get him in asap as well as notify new patient scheduler to call and schedule him to be seen with Dr. Julien Nordmann tomorrow.

## 2020-07-16 NOTE — Telephone Encounter (Signed)
I received referral today on Charles Robinson.  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 I updated rad onc.  I called patient today to schedule him to see Med Onc but was unable to reach. I did leave a vm message with my name and phone number to call. His case will be presented at cancer conference this week.

## 2020-07-16 NOTE — Telephone Encounter (Signed)
Charles Robinson has been scheduled to see Dr. Julien Nordmann on 8/25 at 1pm w/labs at 1230pm. Appt date and time has been given to the pt's wife. Aware to arrive 15 minutes early.

## 2020-07-17 ENCOUNTER — Inpatient Hospital Stay: Payer: Medicare Other

## 2020-07-17 ENCOUNTER — Other Ambulatory Visit: Payer: Self-pay

## 2020-07-17 ENCOUNTER — Ambulatory Visit (HOSPITAL_COMMUNITY): Payer: Medicare Other

## 2020-07-17 ENCOUNTER — Inpatient Hospital Stay: Payer: Medicare Other | Attending: Internal Medicine | Admitting: Internal Medicine

## 2020-07-17 ENCOUNTER — Encounter: Payer: Self-pay | Admitting: Internal Medicine

## 2020-07-17 VITALS — BP 98/54 | HR 93 | Temp 99.5°F | Resp 17 | Ht 75.0 in | Wt 211.7 lb

## 2020-07-17 DIAGNOSIS — R918 Other nonspecific abnormal finding of lung field: Secondary | ICD-10-CM

## 2020-07-17 DIAGNOSIS — J90 Pleural effusion, not elsewhere classified: Secondary | ICD-10-CM | POA: Insufficient documentation

## 2020-07-17 DIAGNOSIS — C3492 Malignant neoplasm of unspecified part of left bronchus or lung: Secondary | ICD-10-CM | POA: Diagnosis not present

## 2020-07-17 DIAGNOSIS — C3490 Malignant neoplasm of unspecified part of unspecified bronchus or lung: Secondary | ICD-10-CM | POA: Diagnosis not present

## 2020-07-17 DIAGNOSIS — C61 Malignant neoplasm of prostate: Secondary | ICD-10-CM | POA: Diagnosis not present

## 2020-07-17 LAB — CBC WITH DIFFERENTIAL (CANCER CENTER ONLY)
Abs Immature Granulocytes: 0.01 10*3/uL (ref 0.00–0.07)
Basophils Absolute: 0 10*3/uL (ref 0.0–0.1)
Basophils Relative: 0 %
Eosinophils Absolute: 0 10*3/uL (ref 0.0–0.5)
Eosinophils Relative: 1 %
HCT: 33.3 % — ABNORMAL LOW (ref 39.0–52.0)
Hemoglobin: 10.6 g/dL — ABNORMAL LOW (ref 13.0–17.0)
Immature Granulocytes: 0 %
Lymphocytes Relative: 6 %
Lymphs Abs: 0.2 10*3/uL — ABNORMAL LOW (ref 0.7–4.0)
MCH: 29.4 pg (ref 26.0–34.0)
MCHC: 31.8 g/dL (ref 30.0–36.0)
MCV: 92.2 fL (ref 80.0–100.0)
Monocytes Absolute: 0.5 10*3/uL (ref 0.1–1.0)
Monocytes Relative: 12 %
Neutro Abs: 3.5 10*3/uL (ref 1.7–7.7)
Neutrophils Relative %: 81 %
Platelet Count: 238 10*3/uL (ref 150–400)
RBC: 3.61 MIL/uL — ABNORMAL LOW (ref 4.22–5.81)
RDW: 12.7 % (ref 11.5–15.5)
WBC Count: 4.3 10*3/uL (ref 4.0–10.5)
nRBC: 0 % (ref 0.0–0.2)

## 2020-07-17 LAB — CMP (CANCER CENTER ONLY)
ALT: 13 U/L (ref 0–44)
AST: 15 U/L (ref 15–41)
Albumin: 2.4 g/dL — ABNORMAL LOW (ref 3.5–5.0)
Alkaline Phosphatase: 69 U/L (ref 38–126)
Anion gap: 6 (ref 5–15)
BUN: 30 mg/dL — ABNORMAL HIGH (ref 8–23)
CO2: 30 mmol/L (ref 22–32)
Calcium: 9.4 mg/dL (ref 8.9–10.3)
Chloride: 104 mmol/L (ref 98–111)
Creatinine: 1.35 mg/dL — ABNORMAL HIGH (ref 0.61–1.24)
GFR, Est AFR Am: 59 mL/min — ABNORMAL LOW (ref >60)
GFR, Estimated: 51 mL/min — ABNORMAL LOW (ref >60)
Glucose, Bld: 138 mg/dL — ABNORMAL HIGH (ref 70–99)
Potassium: 3.7 mmol/L (ref 3.5–5.1)
Sodium: 140 mmol/L (ref 135–145)
Total Bilirubin: 0.9 mg/dL (ref 0.3–1.2)
Total Protein: 6.3 g/dL — ABNORMAL LOW (ref 6.5–8.1)

## 2020-07-17 NOTE — Progress Notes (Signed)
[place] [phone]   [phone]  CONSULT NOTE  REFERRING PHYSICIAN: Dr. [name]  REASON FOR CONSULTATION:  74 years old white male with highly suspicious metastatic lung cancer.  HPI Charles Robinson is a 74 y.o. male with past medical history significant for hypertension, dyslipidemia, GERD, coronary artery disease status post stent placement, osteoarthritis, atrial fibrillation as well as history of prostate cancer diagnosed in November 2020 status post radiotherapy as well as hormonal therapy under the care of [name] and Dr. Tammi Klippel.  The patient mentions that he has been complaining of cough for the last 5 months.  He was seen at the emergency department on 06/30/2020 after generalized weakness and fall with facial injury and head laceration.  Chest x-ray at that time showed small left pleural effusion with patchy left perihilar lung opacity with volume loss in the left hemothorax.  He was seen by his primary care physician Dr. Manuella Ghazi and CT scan of the chest was performed on 07/11/2020 and it showed large dependent left pleural effusion.  There was also a masslike focus of consolidation in the posterior central left upper lobe measuring 6.3 x 3.5 associated with occlusion of the left upper lobe bronchus and near complete left lower lobe atelectasis.  There was few tiny right upper lobe pulmonary nodules the largest measures 0.2 cm posteriorly.  The scan also showed left external and posterior right fourth rib lesion suspicious for osseous metastasis and suggestion of pathologic posterior right seventh rib fracture and additional indistinct lytic lesions scattered in the thoracic vertebral bodies.  A PET scan was performed on 07/16/2020 and it showed hypermetabolic activity in the left hilum involving the central portion of the atelectatic left upper lobe and left lower lobe.  There was ill-defined hypermetabolic tissue extends into the AP window and the  findings are concerning for bronchogenic carcinoma.  There was postobstructive collapse of the left upper lobe and left lower lobe with large left pleural effusion occupying 70% of the left hemithorax.  There was also mild metabolic activity of a small mediastinal lymph node that is indeterminate.  There was multifocal hypermetabolic lytic skeletal metastasis and the differential includes lung cancer skeletal metastasis versus active multiple myeloma.  The lytic lesions did not favor prostate cancer metastasis.  The patient was scheduled to have ultrasound-guided thoracentesis today but for scheduling issues it was rescheduled to be done on 07/19/2020. The patient was referred to me today for further evaluation and recommendation regarding his condition. When seen today he continues to complain of generalized weakness and fatigue as well as shortness of breath at baseline increased with exertion and cough with no hemoptysis.  He denied having any chest pain.  The patient denied having any nausea, vomiting, diarrhea or constipation.  He has no headache or visual changes.  He lost several pounds recently. Family history significant for mother with leukemia.  Father had heart disease.  Sister had bladder cancer and brother had prostate cancer. The patient is married and has 2 children a son and daughter.  He is currently retired and used to work as a Engineer, maintenance (IT).  He was accompanied by his wife [name].  The patient is a never smoker and he drinks alcohol occasionally.  He has no history of drug abuse. HPI  Past Medical History:  Diagnosis Date  . Arthritis    back  . BPH (benign prostatic hypertrophy)   . CAD S/P multivessel PCI: LAD, RCA and extensive LCX-OM1 03/15/2012  s/p PCI to LAD, to RCA (now occluded), then extensive PCI to Dominant LCx-OM1  (enitre prox-AVG Circ for dissection in 07/2011)  . Cancer ([place])    SKIN CA  . GERD (gastroesophageal reflux disease)   . History of colon polyps   . History of  echocardiogram 08/16/2011   Echo - EF 60-65%; moderate LV concentric hypertrophy; abdnormal LV relaxation (grade 1 diastolic dysfunction; ascending aorta mildly dilated; LA moderately dilated;   . Hyperlipidemia   . Hypertension   . 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 PAF (paroxysmal atrial fibrillation) ([place]) 12/20/2012   followed by Dr. Glenetta Hew; s/p DCCV 2011, 07/2011 post PCI with Type 4a MI; CHA2DS2Vasc = 3, on Warfarin  . Paroxysmal atrial flutter ([place]) 08/20/2011   TEE- atrial septum - no defect identified; RA normal in size, no evidence of thrombus; ascending aorta normal  . Prostate cancer ([place])   . Rupture quadriceps tendon     Past Surgical History:  Procedure Laterality Date  . BACK SURGERY    . COLONOSCOPY    . CORONARY ANGIOPLASTY WITH STENT PLACEMENT  03/15/2012   multiple stens in AVGroove Circ & OM1;; 2 site PTCA w/ PTCA and stenting of OM2 ang PTCA of distal stent ISR followed by PTCA of mid LCx ISR  . CORONARY STENT PLACEMENT  2003 - 2011 - 2012 - 2013   Pre-2012 - BMS in RCA now known occluded, BMS in proximal LAD; 07/2011 - 2.25 x 23 BMS in OM 1 with significant disease on either side noted shortly after -->  2 additional overlapping DES 2.5 mm x 30 mm and 2.25 mm x 26 mm in OM1,; 3 overlapping Resolute DES in AV groove Cx crossing OM1: (Tapered from 3.8-2.6 mm)- Resolute DES 3.5 x 22, 3.0 x 38, 2.5 x 14  . DOPPLER ECHOCARDIOGRAPHY  07/2011   EF ~60-65%, Grade 1 D Dysfxn; mod Conc LVH  . ENDARTERECTOMY Left 02/27/2016   Procedure: LEFT  CAROTID ARTERY ENDARTERECTOMY ;  Surgeon: Serafina Mitchell, MD;  Location: [facility];  Service: Vascular;  Laterality: Left;  . LEFT HEART CATHETERIZATION WITH CORONARY ANGIOGRAM N/A 03/15/2012   Procedure: LEFT HEART CATHETERIZATION WITH CORONARY ANGIOGRAM;  Surgeon: [name], MD;  Location: [facility] CATH LAB: patent LAD stents, patent LCx stents w/80%  focal ISR just distal to OM; OM proximal stent open; -- 2 site PTCA-PCI  . LUMBAR LAMINECTOMY/DECOMPRESSION MICRODISCECTOMY Bilateral 12/11/2014   Procedure: Bilateral Lumbar Three-Four Laminectomy;  Surgeon: Kristeen Miss, MD;  Location: [place] [facility];  Service: Neurosurgery;  Laterality: Bilateral;  bilateral  . NM MYOVIEW LTD  11/2012   No ischemia or infarction, normal EF & WM  . PATCH ANGIOPLASTY Left 02/27/2016   Procedure: WITH HEMASHIELD DACRON  PATCH ANGIOPLASTY;  Surgeon: Serafina Mitchell, MD;  Location: [facility];  Service: Vascular;  Laterality: Left;  [name] QUADRICEPS TENDON REPAIR  09/19/2012   Procedure: REPAIR QUADRICEP TENDON;  Surgeon: [name], MD;  Location: [facility];  Service: Orthopedics;  Laterality: Left;  [name] VASECTOMY      Family History  Problem Relation Age of Onset  . Leukemia Mother   . Heart disease Father   . Bladder Cancer Sister   . Prostate cancer Brother   . Breast cancer Maternal Grandmother     Social History Social History   Tobacco Use  . Smoking status: Never Smoker  . Smokeless tobacco: Never Used  Vaping Use  . Vaping Use: Never used  Substance Use Topics  . Alcohol use: Yes    Alcohol/week: 2.0 standard drinks    Types: 2 Cans of beer per week    Comment: rare  . Drug use: No    Allergies  Allergen Reactions  . Lipitor [Atorvastatin] Other (See Comments)    Increased HR is currently tolerating --   . Morphine And Related Nausea And Vomiting  . Vicodin [Hydrocodone-Acetaminophen] Other (See Comments)    Feel crazy    Current Outpatient Medications  Medication Sig Dispense Refill  . amiodarone (PACERONE) 200 MG tablet Take 0.5 tablets (100 mg total) by mouth daily. 90 tablet 3  . clopidogrel (PLAVIX) 75 MG tablet TAKE 1 TABLET BY MOUTH EVERY DAY 90 tablet 2  . Coenzyme Q10 200 MG capsule Take 200 mg by mouth every morning.     [name] ELIQUIS 5 MG TABS tablet TAKE 1 TABLET BY MOUTH TWICE A DAY 180 tablet 2  . ezetimibe (ZETIA) 10 MG tablet  TAKE 1 TABLET BY MOUTH EVERY DAY 90 tablet 2  . finasteride (PROSCAR) 5 MG tablet Take 5 mg by mouth every evening.     [name] Leuprolide Acetate (ELIGARD [place]) Inject into the skin.    . metoprolol tartrate (LOPRESSOR) 25 MG tablet Take 1 tablet (25 mg total) by mouth 2 (two) times daily. 180 tablet 3  . Multiple Vitamin (MULTIVITAMIN WITH MINERALS) TABS tablet Take 1 tablet by mouth every morning.     . nitroGLYCERIN (NITROSTAT) 0.4 MG SL tablet PLACE 1 TABLET (0.4 MG TOTAL) UNDER THE TONGUE EVERY 5 (FIVE) MINUTES AS NEEDED. FOR CHEST PAIN 25 tablet 4  . olmesartan (BENICAR) 20 MG tablet Take 20 mg by mouth daily.    . Omega-3 Fatty Acids (FISH OIL) 1000 MG CAPS Take 1,000 capsules by mouth 2 (two) times daily.     . rosuvastatin (CRESTOR) 40 MG tablet TAKE 1 TABLET BY MOUTH EVERY DAY 90 tablet 1  . tamsulosin (FLOMAX) 0.4 MG CAPS capsule Take 0.4 mg by mouth daily.    . VENTOLIN HFA 108 (90 Base) MCG/ACT inhaler Inhale into the lungs.     No current facility-administered medications for this visit.    Review of Systems  Constitutional: positive for anorexia, fatigue and weight loss Eyes: negative Ears, nose, mouth, throat, and face: negative Respiratory: positive for cough and dyspnea on exertion Cardiovascular: negative Gastrointestinal: negative Genitourinary:negative Integument/breast: negative Hematologic/lymphatic: negative Musculoskeletal:positive for muscle weakness Neurological: negative Behavioral/Psych: negative Endocrine: negative Allergic/Immunologic: negative  Physical Exam  WIO:XBDZH, healthy, no distress, ill looking and malnourished SKIN: skin color, texture, turgor are normal, no rashes or significant lesions HEAD: Normocephalic, No masses, lesions, tenderness or abnormalities EYES: normal, PERRLA, Conjunctiva are pink and non-injected EARS: External ears normal, Canals clear OROPHARYNX:no exudate, no erythema and lips, buccal mucosa, and tongue normal  NECK:  supple, no adenopathy, no JVD LYMPH:  no palpable lymphadenopathy, no hepatosplenomegaly LUNGS: coarse sounds heard, decreased breath sounds HEART: regular rate & rhythm, no murmurs and no gallops ABDOMEN:abdomen soft, non-tender, normal bowel sounds and no masses or organomegaly BACK: Back symmetric, no curvature., No CVA tenderness EXTREMITIES:no joint deformities, effusion, or inflammation, no edema  NEURO: alert & oriented x 3 with fluent speech, no focal motor/sensory deficits  PERFORMANCE STATUS: ECOG 1  LABORATORY DATA: Lab Results  Component Value Date   WBC 4.3 07/17/2020   HGB 10.6 (L) 07/17/2020   HCT 33.3 (L) 07/17/2020   MCV 92.2  07/17/2020   PLT 238 07/17/2020      Chemistry      Component Value Date/Time   NA 141 06/30/2020 1209   NA 140 08/15/2019 0936   K 4.2 06/30/2020 1209   CL 104 06/30/2020 1209   CO2 25 06/30/2020 1209   BUN 21 06/30/2020 1209   BUN 27 08/15/2019 0936   CREATININE 1.46 (H) 06/30/2020 1209   CREATININE 1.43 (H) 07/29/2016 0814      Component Value Date/Time   CALCIUM 8.6 (L) 06/30/2020 1209   ALKPHOS 61 06/30/2020 1209   AST 27 06/30/2020 1209   ALT 18 06/30/2020 1209   BILITOT 0.9 06/30/2020 1209   BILITOT 1.4 (H) 08/15/2019 0936       RADIOGRAPHIC STUDIES: CT HEAD WO CONTRAST  Result Date: 06/30/2020 CLINICAL DATA:  Weakness, fall, facial injury, head laceration, blood thinners EXAM: CT HEAD WITHOUT CONTRAST CT MAXILLOFACIAL WITHOUT CONTRAST CT CERVICAL SPINE WITHOUT CONTRAST TECHNIQUE: Multidetector CT imaging of the head, cervical spine, and maxillofacial structures were performed using the standard protocol without intravenous contrast. Multiplanar CT image reconstructions of the cervical spine and maxillofacial structures were also generated. COMPARISON:  None. FINDINGS: CT HEAD FINDINGS Brain: No evidence of acute infarction, hemorrhage, hydrocephalus, extra-axial collection or mass lesion/mass effect. Vascular: No hyperdense  vessel or unexpected calcification. CT FACIAL BONES FINDINGS Skull: Normal. Negative for fracture or focal lesion. Facial bones: Minimally displaced fracture of the right nasal bone (series 5, image 84). No other displaced fractures or dislocations. Sinuses/Orbits: No acute finding. Other: Soft tissue contusion of the chin and nose. CT CERVICAL SPINE FINDINGS Alignment: Normal. Skull base and vertebrae: No acute fracture. No primary bone lesion or focal pathologic process. Soft tissues and spinal canal: No prevertebral fluid or swelling. No visible canal hematoma. Disc levels: Focally mild disc space height loss and osteophytosis of C5-C6. Disc spaces are otherwise intact. Upper chest: Large, partially imaged left pleural effusion. Other: None. IMPRESSION: 1. No acute intracranial pathology. 2. Minimally displaced fracture of the right nasal bone. No other displaced fractures or dislocations of the facial bones. 3. Soft tissue contusion of the chin and nose. 4. No fracture or static subluxation of the cervical spine. 5. Large, partially imaged left pleural effusion. Electronically Signed   By: Eddie Candle M.D.   On: 06/30/2020 12:42   CT CERVICAL SPINE WO CONTRAST  Result Date: 06/30/2020 CLINICAL DATA:  Weakness, fall, facial injury, head laceration, blood thinners EXAM: CT HEAD WITHOUT CONTRAST CT MAXILLOFACIAL WITHOUT CONTRAST CT CERVICAL SPINE WITHOUT CONTRAST TECHNIQUE: Multidetector CT imaging of the head, cervical spine, and maxillofacial structures were performed using the standard protocol without intravenous contrast. Multiplanar CT image reconstructions of the cervical spine and maxillofacial structures were also generated. COMPARISON:  None. FINDINGS: CT HEAD FINDINGS Brain: No evidence of acute infarction, hemorrhage, hydrocephalus, extra-axial collection or mass lesion/mass effect. Vascular: No hyperdense vessel or unexpected calcification. CT FACIAL BONES FINDINGS Skull: Normal. Negative for  fracture or focal lesion. Facial bones: Minimally displaced fracture of the right nasal bone (series 5, image 84). No other displaced fractures or dislocations. Sinuses/Orbits: No acute finding. Other: Soft tissue contusion of the chin and nose. CT CERVICAL SPINE FINDINGS Alignment: Normal. Skull base and vertebrae: No acute fracture. No primary bone lesion or focal pathologic process. Soft tissues and spinal canal: No prevertebral fluid or swelling. No visible canal hematoma. Disc levels: Focally mild disc space height loss and osteophytosis of C5-C6. Disc spaces are otherwise intact. Upper chest: Large, partially  imaged left pleural effusion. Other: None. IMPRESSION: 1. No acute intracranial pathology. 2. Minimally displaced fracture of the right nasal bone. No other displaced fractures or dislocations of the facial bones. 3. Soft tissue contusion of the chin and nose. 4. No fracture or static subluxation of the cervical spine. 5. Large, partially imaged left pleural effusion. Electronically Signed   By: Eddie Candle M.D.   On: 06/30/2020 12:42   CT Chest High Resolution  Result Date: 07/11/2020 CLINICAL DATA:  Cough for 5 months. Abnormal chest radiograph. History of amiodarone therapy. History of prostate cancer. EXAM: CT CHEST WITHOUT CONTRAST TECHNIQUE: Multidetector CT imaging of the chest was performed following the standard protocol without intravenous contrast. High resolution imaging of the lungs, as well as inspiratory and expiratory imaging, was performed. COMPARISON:  06/30/2020 chest radiograph. FINDINGS: Cardiovascular: Normal heart size. Small pericardial effusion. Three-vessel coronary atherosclerosis. Atherosclerotic thoracic aorta with ectatic 4.4 cm ascending thoracic aorta. Top-normal caliber main pulmonary artery (3.2 cm diameter). Mediastinum/Nodes: No discrete thyroid nodules. Unremarkable esophagus. No axillary adenopathy. No pathologically enlarged mediastinal or discrete hilar nodes  on this noncontrast scan. Lungs/Pleura: No pneumothorax. No right pleural effusion. Large dependent left pleural effusion. Masslike focus of consolidation in posterior central left upper lobe measuring 6.3 x 3.5 cm (series 2/image 50), associated with occlusion of the left upper lobe bronchus. Near complete left lower lobe atelectasis. A few tiny right upper lobe pulmonary nodules, largest 0.2 cm posteriorly (series 7/image 46). No evidence of significant air trapping or tracheobronchomalacia on the expiration sequence. No significant regions of subpleural reticulation, traction bronchiectasis or frank honeycombing. Upper abdomen: Cholelithiasis. Simple 1.2 cm anterior segment 4 left liver cyst. A few additional scattered subcentimeter hypodense liver lesions are too small to characterize. Partially visualized exophytic simple appearing 3.8 cm upper right renal cyst. Cholelithiasis. Musculoskeletal: Lytic 2.3 cm sternal lesion (series 7/image 52). Lytic 3.2 cm posterior right fourth rib lesion (series 7/image 29). Minimally displaced posterior right seventh rib fracture with suggestion of underlying lytic lesion (series 7/image 64). Suggestion of additional indistinct lytic lesions scattered in the thoracic vertebral bodies, for example posteriorly at T11 (series 6/image 92). Mild bilateral gynecomastia. Moderate thoracic spondylosis. IMPRESSION: 1. Masslike 6.3 x 3.5 cm focus of consolidation in the posterior central left upper lobe, associated with occlusion of the left upper lobe bronchus. Findings are worrisome for primary bronchogenic carcinoma. 2. Large dependent left pleural effusion, potentially malignant. 3. Lytic sternal and posterior right fourth rib lesions, suspicious for osseous metastases. Suggestion of pathologic posterior right seventh rib fracture. Suggestion of additional indistinct lytic lesions scattered in the thoracic vertebral bodies. 4. Multidisciplinary thoracic oncology consultation  suggested. PET-CT suggested for further evaluation. 5. A few tiny right upper lobe pulmonary nodules, largest 0.2 cm, indeterminate. Recommend attention on follow-up chest CT in 3 months. 6. Small pericardial effusion. 7. Three-vessel coronary atherosclerosis. 8. Ectatic 4.4 cm ascending thoracic aorta. Recommend annual imaging followup by CTA or MRA. This recommendation follows 2010 ACCF/AHA/AATS/ACR/ASA/SCA/SCAI/SIR/STS/SVM Guidelines for the Diagnosis and Management of Patients with Thoracic Aortic Disease. Circulation. 2010; 121: J287-O676. Aortic aneurysm NOS (ICD10-I71.9). 9. Cholelithiasis. 10. Aortic Atherosclerosis (ICD10-I70.0). These results will be called to the ordering clinician or representative by the Radiologist Assistant, and communication documented in the PACS or [employer]. Electronically Signed   By: Ilona Sorrel M.D.   On: 07/11/2020 16:51   NM PET Image Initial (PI) Skull Base To Thigh  Result Date: 07/16/2020 CLINICAL DATA:  Initial treatment strategy for lung mass with effusion.  History of treated prostate carcinoma EXAM: NUCLEAR MEDICINE PET SKULL BASE TO THIGH TECHNIQUE: 11.2 mCi F-18 FDG was injected intravenously. Full-ring PET imaging was performed from the skull base to thigh after the radiotracer. CT data was obtained and used for attenuation correction and anatomic localization. Fasting blood glucose: 104 mg/dl COMPARISON:  CT 07/11/2020 FINDINGS: Mediastinal blood pool activity: SUV max 2.9 Liver activity: SUV max 3.3 NECK: No hypermetabolic lymph nodes in the neck. Incidental CT findings: none CHEST: Hypermetabolic activity in the LEFT hilum posteriorly which localizes to the central portion of the atelectatic LEFT lower lobe. No focal mass lesion identified however central ill-defined activity which is intense with SUV max equal 6.3. Hypermetabolic activity also associated centrally within the atelectatic LEFT upper lobe with SUV max equal 5.0. Ill-defined  hypermetabolic tissue extends into the AP window seen best on coronal image 159. There is a complete collapse of the LEFT lower lobe. Partial collapse of the LEFT upper lobe. Large LEFT pleural effusion. Mild metabolic activity associated with contralateral RIGHT lower paratracheal node. This node is difficult define on CT portion measuring approximately 1 cm image 75/4 and with moderate metabolic activity SUV max equal 4.2. Mild activity associated with small subcarinal node measuring 6 mm (80/4) with SUV max equal 3.2. No hypermetabolic supraclavicular adenopathy. Incidental CT findings: No nodularity in the RIGHT lung. ABDOMEN/PELVIS: No focal activity within the liver. No abnormal activity associated with the adrenal glands. No hypermetabolic abdominopelvic lymph nodes. Diffuse metabolic activity associated with the bowel is favored physiologic. No abnormal activity in the prostate gland. Incidental CT findings: Small gallstones noted. Bilateral benign-appearing low-density renal cysts. Atherosclerotic calcification of the aorta. Mild sigmoid colon diverticulosis. SKELETON: Multifocal hypermetabolic skeletal metastasis. Lytic lesion in the upper sternum measuring 2.1 cm has intense metabolic activity with SUV max equal 8.8. Multiple lesions the cervical, thoracic and lumbar spine. Example lesion in the T12 vertebral body extends into the RIGHT pedicle measuring 2.6 cm with SUV max equal 10.6. Small lucent lesion with peripheral sclerosis measures 10 mm (image 118/series 4) at L1 with SUV max equal 7.0. Multiple bilateral hypermetabolic rib lesions. Probable pathologic fracture on posterior RIGHT rib ninth rib with SUV max equal 6.8. Small lesion anterior aspect of the LEFT second rib with SUV max equals 6.6. Lytic mildly expansile lesion at L4 posterior on the RIGHT measures 2.6 cm (image 64/4) does not have metabolic activity. There is a lucent lesion the central body of the LEFT scapula with SUV max equal 6.9.  Lucent lesion within the angle of the LEFT mandible with SUV max equal 6.8. This small lesion measuring 6 mm. (Image 27/4) Incidental CT findings: none IMPRESSION: 1. Hypermetabolic activity in the LEFT hilum involving the central portion of the atelectatic LEFT upper lobe and LEFT lower lobe. Ill-defined hypermetabolic tissue extends into the AP window. Findings are concerning for bronchogenic carcinoma. 2. Postobstructive collapse of the LEFT upper lobe and LEFT lower lobe. Large LEFT pleural effusion occupying 70% of the LEFT hemithorax. Recommend thoracentesis with cytology. 3. Mild metabolic activity of small mediastinal lymph nodes is indeterminate. 4. Multifocal hypermetabolic lytic skeletal metastasis. Differential would include lung cancer skeletal metastasis versus active multiple myeloma. Lytic lesions do not favor prostate cancer metastasis. Lesion in the mandible is typical location for multiple myeloma although given the LEFT lung findings bronchogenic carcinoma would be favored differential. Recommend tissue sampling (sternum potential site). These results will be called to the ordering clinician or representative by the Radiologist Assistant, and communication documented  in the PACS or [employer]. Electronically Signed   By: Suzy Bouchard M.D.   On: 07/16/2020 13:13   DG Chest Portable 1 View  Result Date: 06/30/2020 CLINICAL DATA:  Fall, anticoagulated, arrhythmia EXAM: PORTABLE CHEST 1 VIEW COMPARISON:  03/09/2012 chest radiograph. FINDINGS: Stable cardiomediastinal silhouette with normal heart size. No pneumothorax. Small left pleural effusion. No right pleural effusion. Patchy left perihilar lung opacity with volume loss in the left hemithorax. IMPRESSION: Small left pleural effusion. Patchy left perihilar lung opacity with volume loss in the left hemithorax. Findings could represent aspiration, atelectasis or pneumonia, with underlying central left lung mass not excluded. Recommend  further evaluation with chest CT with IV contrast. Electronically Signed   By: Ilona Sorrel M.D.   On: 06/30/2020 12:19   VAS US CAROTID  Result Date: 07/15/2020 Carotid Arterial Duplex Study Indications:       Carotid artery disease, left endarterectomy and Left CE                    10/31/2018. Risk Factors:      Hypertension, no history of smoking, coronary artery disease. Comparison Study:  Prior duplex 12/25/2019 showed a 1-39% RICA stenosis and LICA                    occlusion. Performing Technologist: Delorise Shiner RVT  Examination Guidelines: A complete evaluation includes B-mode imaging, spectral Doppler, color Doppler, and power Doppler as needed of all accessible portions of each vessel. Bilateral testing is considered an integral part of a complete examination. Limited examinations for reoccurring indications may be performed as noted.  Right Carotid Findings: +----------+--------+--------+--------+------------------------+--------+           PSV cm/sEDV cm/sStenosisPlaque Description      Comments +----------+--------+--------+--------+------------------------+--------+ CCA Prox  76      27              hypoechoic and smooth            +----------+--------+--------+--------+------------------------+--------+ CCA Mid   73      24                                               +----------+--------+--------+--------+------------------------+--------+ CCA Distal63      24                                               +----------+--------+--------+--------+------------------------+--------+ ICA Prox  39      14      1-39%   homogeneous and calcific         +----------+--------+--------+--------+------------------------+--------+ ICA Mid   64      27                                               +----------+--------+--------+--------+------------------------+--------+ ICA Distal60      22                                      tortuous  +----------+--------+--------+--------+------------------------+--------+ ECA  54      13                                               +----------+--------+--------+--------+------------------------+--------+ +----------+--------+-------+----------------+-------------------+           PSV cm/sEDV cmsDescribe        Arm Pressure (mmHG) +----------+--------+-------+----------------+-------------------+ [id number]      0      Multiphasic, WNL                    +----------+--------+-------+----------------+-------------------+ +---------+--------+--+--------+--+---------+ VertebralPSV cm/s65EDV cm/s32Antegrade +---------+--------+--+--------+--+---------+  Left Carotid Findings: +----------+--------+--------+--------+----------------------+--------+           PSV cm/sEDV cm/sStenosisPlaque Description    Comments +----------+--------+--------+--------+----------------------+--------+ CCA Prox  67      8                                              +----------+--------+--------+--------+----------------------+--------+ CCA Mid   50      7                                              +----------+--------+--------+--------+----------------------+--------+ CCA Distal94      9       <50%    smooth and homogeneous         +----------+--------+--------+--------+----------------------+--------+ ICA Prox  0       0       Occludedcalcific                       +----------+--------+--------+--------+----------------------+--------+ ICA Mid   0       0       Occludedcalcific                       +----------+--------+--------+--------+----------------------+--------+ ICA Distal0       0       Occludedheterogenous                   +----------+--------+--------+--------+----------------------+--------+ ECA       59      12                                             +----------+--------+--------+--------+----------------------+--------+  +----------+--------+--------+----------------+-------------------+           PSV cm/sEDV cm/sDescribe        Arm Pressure (mmHG) +----------+--------+--------+----------------+-------------------+ [id number]      0       Multiphasic, WNL                    +----------+--------+--------+----------------+-------------------+ +---------+--------+--+--------+--+---------+ VertebralPSV cm/s50EDV cm/s19Antegrade +---------+--------+--+--------+--+---------+   Summary: Right Carotid: Velocities in the right ICA are consistent with a 1-39% stenosis. 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               Non-hemodynamically significant plaque <50% noted in the CCA. Vertebrals:  Bilateral vertebral arteries demonstrate antegrade flow. Subclavians: Normal flow hemodynamics were seen in bilateral subclavian  arteries. *See table(s) above for measurements and observations.  Electronically signed by Harold Barban MD on 07/15/2020 at 3:50:16 PM.    Final    CT MAXILLOFACIAL WO CONTRAST  Result Date: 06/30/2020 CLINICAL DATA:  Weakness, fall, facial injury, head laceration, blood thinners EXAM: CT HEAD WITHOUT CONTRAST CT MAXILLOFACIAL WITHOUT CONTRAST CT CERVICAL SPINE WITHOUT CONTRAST TECHNIQUE: Multidetector CT imaging of the head, cervical spine, and maxillofacial structures were performed using the standard protocol without intravenous contrast. Multiplanar CT image reconstructions of the cervical spine and maxillofacial structures were also generated. COMPARISON:  None. FINDINGS: CT HEAD FINDINGS Brain: No evidence of acute infarction, hemorrhage, hydrocephalus, extra-axial collection or mass lesion/mass effect. Vascular: No hyperdense vessel or unexpected calcification. CT FACIAL BONES FINDINGS Skull: Normal. Negative for fracture or focal lesion. Facial bones: Minimally displaced fracture of the right nasal bone (series 5, image 84). No other displaced  fractures or dislocations. Sinuses/Orbits: No acute finding. Other: Soft tissue contusion of the chin and nose. CT CERVICAL SPINE FINDINGS Alignment: Normal. Skull base and vertebrae: No acute fracture. No primary bone lesion or focal pathologic process. Soft tissues and spinal canal: No prevertebral fluid or swelling. No visible canal hematoma. Disc levels: Focally mild disc space height loss and osteophytosis of C5-C6. Disc spaces are otherwise intact. Upper chest: Large, partially imaged left pleural effusion. Other: None. IMPRESSION: 1. No acute intracranial pathology. 2. Minimally displaced fracture of the right nasal bone. No other displaced fractures or dislocations of the facial bones. 3. Soft tissue contusion of the chin and nose. 4. No fracture or static subluxation of the cervical spine. 5. Large, partially imaged left pleural effusion. Electronically Signed   By: Eddie Candle M.D.   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   PLAN: I had a lengthy discussion with the patient and his wife today about his current disease stage, prognosis and treatment options. I personally and independently reviewed the scan images and discussed the result and showed the images to the patient and his wife today. I recommended for the patient to proceed with the ultrasound-guided thoracentesis of the left pleural effusion for tissue diagnosis as well as therapeutic purposes. If the fluid is negative for malignancy, I will refer the patient to pulmonary medicine or cardiothoracic surgery for  consideration of bronchoscopy with biopsy for confirmation of the tissue diagnosis. If the final pathology is consistent with non-small cell lung cancer, adenocarcinoma we will send his tissue or blood test for molecular studies. I will complete the staging work-up by ordering MRI of the brain to rule out brain metastasis. I will see the patient back for follow-up visit next week for reevaluation and discussion of his treatment option based on the final pathology and staging work-up. The patient was advised to call immediately if he has any concerning symptoms in the interval. The patient voices understanding of current disease status and treatment options and is in agreement with the current care plan.  All questions were answered. The patient knows to call the clinic with any problems, questions or concerns. We can certainly see the patient much  sooner if necessary.  Thank you so much for allowing me to participate in the care of [name]. I will continue to follow up the patient with you and assist in his care. The total time spent in the appointment was 70 minutes.  Disclaimer: This note was dictated with voice recognition software. Similar sounding words can inadvertently be transcribed and may not be corrected upon review.   Eilleen Kempf July 17, 2020, 1:21 PM

## 2020-07-18 ENCOUNTER — Other Ambulatory Visit: Payer: Self-pay | Admitting: *Deleted

## 2020-07-18 NOTE — Progress Notes (Signed)
The proposed treatment discussed in cancer conference is for discussion purpose only and is not a binding recommendation. The patient was not physically examined nor present for their treatment options. Therefore, final treatment plans cannot be decided.  ?

## 2020-07-19 ENCOUNTER — Encounter: Payer: Self-pay | Admitting: Surgery

## 2020-07-19 ENCOUNTER — Other Ambulatory Visit: Payer: Self-pay

## 2020-07-19 ENCOUNTER — Ambulatory Visit (HOSPITAL_COMMUNITY)
Admission: RE | Admit: 2020-07-19 | Discharge: 2020-07-19 | Disposition: A | Discharge status: Home / Self Care | Payer: Medicare Other | Source: Ambulatory Visit | Attending: Radiology | Admitting: Radiology

## 2020-07-19 ENCOUNTER — Ambulatory Visit (HOSPITAL_COMMUNITY)
Admission: RE | Admit: 2020-07-19 | Discharge: 2020-07-19 | Disposition: A | Discharge status: Home / Self Care | Payer: Medicare Other | Source: Ambulatory Visit | Attending: Internal Medicine | Admitting: Internal Medicine

## 2020-07-19 ENCOUNTER — Ambulatory Visit (HOSPITAL_COMMUNITY): Payer: Medicare Other

## 2020-07-19 ENCOUNTER — Ambulatory Visit (HOSPITAL_COMMUNITY): Admission: RE | Admit: 2020-07-19 | Payer: Medicare Other | Source: Ambulatory Visit

## 2020-07-19 DIAGNOSIS — J90 Pleural effusion, not elsewhere classified: Secondary | ICD-10-CM | POA: Insufficient documentation

## 2020-07-19 DIAGNOSIS — Z9889 Other specified postprocedural states: Secondary | ICD-10-CM

## 2020-07-19 MED ORDER — LIDOCAINE HCL 1 % IJ SOLN
INTRAMUSCULAR | Status: AC
  Filled 2020-07-19: qty 20

## 2020-07-19 NOTE — Procedures (Addendum)
Ultrasound-guided diagnostic and therapeutic left thoracentesis performed yielding 2.5 liters of blood-tinged fluid. No immediate complications. Follow-up chest x-ray pending. A portion of the fluid was sent to the lab for preordered studies. EBL< 2 cc.Due to pt coughing only the above amount of fluid was removed today.

## 2020-07-22 ENCOUNTER — Other Ambulatory Visit: Payer: Self-pay | Admitting: Cardiology

## 2020-07-23 LAB — BODY FLUID CULTURE
Culture: NO GROWTH
Gram Stain: NONE SEEN

## 2020-07-24 ENCOUNTER — Encounter: Payer: Self-pay | Admitting: *Deleted

## 2020-07-24 DIAGNOSIS — R918 Other nonspecific abnormal finding of lung field: Secondary | ICD-10-CM

## 2020-07-24 LAB — CYTOLOGY - NON PAP

## 2020-07-24 NOTE — Progress Notes (Signed)
I followed up on Mr. Charles Robinson path and updated Dr. Julien Nordmann.  He would like [facility] lab done on 07/26/20.  Will place order.

## 2020-07-25 ENCOUNTER — Other Ambulatory Visit: Payer: Self-pay

## 2020-07-25 ENCOUNTER — Ambulatory Visit (HOSPITAL_COMMUNITY)
Admission: RE | Admit: 2020-07-25 | Discharge: 2020-07-25 | Disposition: A | Discharge status: Home / Self Care | Payer: Medicare Other | Source: Ambulatory Visit | Attending: Internal Medicine | Admitting: Internal Medicine

## 2020-07-25 DIAGNOSIS — C3490 Malignant neoplasm of unspecified part of unspecified bronchus or lung: Secondary | ICD-10-CM | POA: Diagnosis present

## 2020-07-25 MED ORDER — GADOBUTROL 1 MMOL/ML IV SOLN
10.0000 mL | Freq: Once | INTRAVENOUS | Status: AC | PRN
  Administered 2020-07-25: 10 mL via INTRAVENOUS

## 2020-07-26 ENCOUNTER — Telehealth: Payer: Self-pay | Admitting: *Deleted

## 2020-07-26 ENCOUNTER — Other Ambulatory Visit: Payer: Self-pay | Admitting: Internal Medicine

## 2020-07-26 ENCOUNTER — Encounter: Payer: Self-pay | Admitting: Internal Medicine

## 2020-07-26 ENCOUNTER — Inpatient Hospital Stay: Payer: Medicare Other

## 2020-07-26 ENCOUNTER — Other Ambulatory Visit: Payer: Self-pay

## 2020-07-26 ENCOUNTER — Telehealth: Payer: Self-pay | Admitting: Internal Medicine

## 2020-07-26 ENCOUNTER — Inpatient Hospital Stay: Payer: Medicare Other | Attending: Internal Medicine | Admitting: Internal Medicine

## 2020-07-26 VITALS — BP 104/60 | HR 85 | Temp 97.8°F | Resp 17 | Ht 75.0 in | Wt 206.5 lb

## 2020-07-26 DIAGNOSIS — C61 Malignant neoplasm of prostate: Secondary | ICD-10-CM | POA: Diagnosis not present

## 2020-07-26 DIAGNOSIS — J9811 Atelectasis: Secondary | ICD-10-CM | POA: Diagnosis not present

## 2020-07-26 DIAGNOSIS — Z7901 Long term (current) use of anticoagulants: Secondary | ICD-10-CM | POA: Insufficient documentation

## 2020-07-26 DIAGNOSIS — Z5111 Encounter for antineoplastic chemotherapy: Secondary | ICD-10-CM | POA: Diagnosis present

## 2020-07-26 DIAGNOSIS — C3492 Malignant neoplasm of unspecified part of left bronchus or lung: Secondary | ICD-10-CM

## 2020-07-26 DIAGNOSIS — Z79818 Long term (current) use of other agents affecting estrogen receptors and estrogen levels: Secondary | ICD-10-CM | POA: Insufficient documentation

## 2020-07-26 DIAGNOSIS — C3412 Malignant neoplasm of upper lobe, left bronchus or lung: Secondary | ICD-10-CM | POA: Insufficient documentation

## 2020-07-26 DIAGNOSIS — N400 Enlarged prostate without lower urinary tract symptoms: Secondary | ICD-10-CM | POA: Insufficient documentation

## 2020-07-26 DIAGNOSIS — E785 Hyperlipidemia, unspecified: Secondary | ICD-10-CM | POA: Diagnosis not present

## 2020-07-26 DIAGNOSIS — Z5112 Encounter for antineoplastic immunotherapy: Secondary | ICD-10-CM | POA: Diagnosis present

## 2020-07-26 DIAGNOSIS — R5383 Other fatigue: Secondary | ICD-10-CM | POA: Diagnosis not present

## 2020-07-26 DIAGNOSIS — I119 Hypertensive heart disease without heart failure: Secondary | ICD-10-CM | POA: Diagnosis not present

## 2020-07-26 DIAGNOSIS — Z7189 Other specified counseling: Secondary | ICD-10-CM | POA: Diagnosis not present

## 2020-07-26 DIAGNOSIS — Z79899 Other long term (current) drug therapy: Secondary | ICD-10-CM | POA: Diagnosis not present

## 2020-07-26 DIAGNOSIS — R630 Anorexia: Secondary | ICD-10-CM | POA: Diagnosis not present

## 2020-07-26 DIAGNOSIS — M6281 Muscle weakness (generalized): Secondary | ICD-10-CM | POA: Diagnosis not present

## 2020-07-26 DIAGNOSIS — K219 Gastro-esophageal reflux disease without esophagitis: Secondary | ICD-10-CM | POA: Insufficient documentation

## 2020-07-26 DIAGNOSIS — I6523 Occlusion and stenosis of bilateral carotid arteries: Secondary | ICD-10-CM

## 2020-07-26 DIAGNOSIS — Z9181 History of falling: Secondary | ICD-10-CM | POA: Diagnosis not present

## 2020-07-26 DIAGNOSIS — C7951 Secondary malignant neoplasm of bone: Secondary | ICD-10-CM | POA: Insufficient documentation

## 2020-07-26 DIAGNOSIS — R634 Abnormal weight loss: Secondary | ICD-10-CM | POA: Insufficient documentation

## 2020-07-26 DIAGNOSIS — I480 Paroxysmal atrial fibrillation: Secondary | ICD-10-CM | POA: Diagnosis not present

## 2020-07-26 DIAGNOSIS — I2510 Atherosclerotic heart disease of native coronary artery without angina pectoris: Secondary | ICD-10-CM | POA: Insufficient documentation

## 2020-07-26 DIAGNOSIS — M1990 Unspecified osteoarthritis, unspecified site: Secondary | ICD-10-CM | POA: Insufficient documentation

## 2020-07-26 DIAGNOSIS — Z8673 Personal history of transient ischemic attack (TIA), and cerebral infarction without residual deficits: Secondary | ICD-10-CM | POA: Insufficient documentation

## 2020-07-26 DIAGNOSIS — R918 Other nonspecific abnormal finding of lung field: Secondary | ICD-10-CM

## 2020-07-26 NOTE — Telephone Encounter (Signed)
I placed a referral to vascular surgery to evaluate his condition and management as needed.  Please let the patient know.  Thank you.

## 2020-07-26 NOTE — Telephone Encounter (Signed)
Scheduled appointments per 9/3 los. Gave patient updated calendar.

## 2020-07-26 NOTE — Progress Notes (Signed)
[place] [phone]   [phone]  OFFICE PROGRESS NOTE  [name], MD [place] [address]  DIAGNOSIS: Stage IV (T3, N2, M1c) non-small cell lung cancer, adenocarcinoma presented with left upper lobe lung mass with occlusion of the left upper lobe bronchus and near complete left lower lobe atelectasis in addition to mediastinal lymphadenopathy in the AP window and suspicious malignant left pleural effusion and multifocal bone metastasis diagnosed in August 2021.  PRIOR THERAPY: None  CURRENT THERAPY: None  INTERVAL HISTORY: Charles Robinson 74 y.o. male returns to the clinic today for follow-up visit accompanied by his wife.  The patient continues to complain of fatigue and weakness as well as the shortness of breath at baseline increased with exertion and intermittent left-sided chest pain especially in the shoulder bone area.  He denied having any hemoptysis.  He denied having any fever or chills.  He has no nausea, vomiting, diarrhea or constipation.  He denied having any headache or visual changes.  He has no bleeding issues.  The patient had several studies performed recently including a PET scan as well as MRI of the brain in addition to ultrasound-guided left thoracentesis and the final pathology was consistent with metastatic adenocarcinoma.  The patient is here today for evaluation and discussion of his treatment options.  MEDICAL HISTORY: Past Medical History:  Diagnosis Date  . Arthritis    back  . BPH (benign prostatic hypertrophy)   . CAD S/P multivessel PCI: LAD, RCA and extensive LCX-OM1 03/15/2012   s/p PCI to LAD, to RCA (now occluded), then extensive PCI to Dominant LCx-OM1  (enitre prox-AVG Circ for dissection in 07/2011)  . Cancer ([place])    SKIN CA  . GERD (gastroesophageal reflux disease)   . History of colon polyps   . History of echocardiogram 08/16/2011   Echo - EF 60-65%; moderate LV concentric  hypertrophy; abdnormal LV relaxation (grade 1 diastolic dysfunction; ascending aorta mildly dilated; LA moderately dilated;   . Hyperlipidemia   . Hypertension   . 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 PAF (paroxysmal atrial fibrillation) ([place]) 12/20/2012   followed by Dr. Glenetta Hew; s/p DCCV 2011, 07/2011 post PCI with Type 4a MI; CHA2DS2Vasc = 3, on Warfarin  . Paroxysmal atrial flutter ([place]) 08/20/2011   TEE- atrial septum - no defect identified; RA normal in size, no evidence of thrombus; ascending aorta normal  . Prostate cancer ([place])   . Rupture quadriceps tendon     ALLERGIES:  is allergic to lipitor [atorvastatin], morphine and related, and vicodin [hydrocodone-acetaminophen].  MEDICATIONS:  Current Outpatient Medications  Medication Sig Dispense Refill  . amiodarone (PACERONE) 200 MG tablet Take 0.5 tablets (100 mg total) by mouth daily. 90 tablet 3  . Coenzyme Q10 200 MG capsule Take 200 mg by mouth every morning.     [name] ELIQUIS 5 MG TABS tablet TAKE 1 TABLET BY MOUTH TWICE A DAY 180 tablet 2  . ezetimibe (ZETIA) 10 MG tablet TAKE 1 TABLET BY MOUTH EVERY DAY 90 tablet 2  . finasteride (PROSCAR) 5 MG tablet Take 5 mg by mouth every evening.     [name] Leuprolide Acetate (ELIGARD [place]) Inject into the skin.    . metoprolol tartrate (LOPRESSOR) 25 MG tablet Take 1 tablet (25 mg total) by mouth 2 (two) times daily. 180 tablet 3  . Multiple Vitamin (MULTIVITAMIN WITH MINERALS)  TABS tablet Take 1 tablet by mouth every morning.     . nitroGLYCERIN (NITROSTAT) 0.4 MG SL tablet PLACE 1 TABLET (0.4 MG TOTAL) UNDER THE TONGUE EVERY 5 (FIVE) MINUTES AS NEEDED. FOR CHEST PAIN 25 tablet 4  . olmesartan (BENICAR) 20 MG tablet Take 20 mg by mouth daily.    . Omega-3 Fatty Acids (FISH OIL) 1000 MG CAPS Take 1,000 capsules by mouth 2 (two) times daily.     . rosuvastatin (CRESTOR) 40 MG tablet TAKE 1 TABLET BY MOUTH  EVERY DAY 90 tablet 1  . tamsulosin (FLOMAX) 0.4 MG CAPS capsule Take 0.4 mg by mouth daily.    . VENTOLIN HFA 108 (90 Base) MCG/ACT inhaler Inhale into the lungs.     No current facility-administered medications for this visit.    SURGICAL HISTORY:  Past Surgical History:  Procedure Laterality Date  . BACK SURGERY    . COLONOSCOPY    . CORONARY ANGIOPLASTY WITH STENT PLACEMENT  03/15/2012   multiple stens in AVGroove Circ & OM1;; 2 site PTCA w/ PTCA and stenting of OM2 ang PTCA of distal stent ISR followed by PTCA of mid LCx ISR  . CORONARY STENT PLACEMENT  2003 - 2011 - 2012 - 2013   Pre-2012 - BMS in RCA now known occluded, BMS in proximal LAD; 07/2011 - 2.25 x 23 BMS in OM 1 with significant disease on either side noted shortly after -->  2 additional overlapping DES 2.5 mm x 30 mm and 2.25 mm x 26 mm in OM1,; 3 overlapping Resolute DES in AV groove Cx crossing OM1: (Tapered from 3.8-2.6 mm)- Resolute DES 3.5 x 22, 3.0 x 38, 2.5 x 14  . DOPPLER ECHOCARDIOGRAPHY  07/2011   EF ~60-65%, Grade 1 D Dysfxn; mod Conc LVH  . ENDARTERECTOMY Left 02/27/2016   Procedure: LEFT  CAROTID ARTERY ENDARTERECTOMY ;  Surgeon: Serafina Mitchell, MD;  Location: [facility];  Service: Vascular;  Laterality: Left;  . LEFT HEART CATHETERIZATION WITH CORONARY ANGIOGRAM N/A 03/15/2012   Procedure: LEFT HEART CATHETERIZATION WITH CORONARY ANGIOGRAM;  Surgeon: [name], MD;  [facility] C[facility] LAB: patent LAD stents, patent LCx stents w/80% focal ISR just distal to OM; OM proximal stent open; -- 2 site PTCA-PCI  . LUMBAR LAMINECTOMY/DECOMPRESSION MICRODISCECTOMY Bilateral 12/11/2014   Procedure: Bilateral Lumbar Three-Four Laminectomy;  Surgeon: Kristeen Miss, MD;  Location: [facility] NEURO ORS;  Service: Neurosurgery;  Laterality: Bilateral;  bilateral  . NM MYOVIEW LTD  11/2012   No ischemia or infarction, normal EF & WM  . PATCH ANGIOPLASTY Left 02/27/2016   Procedure: WITH HEMASHIELD DACRON  PATCH ANGIOPLASTY;  Surgeon: Serafina Mitchell, MD;  Location: [facility];  Service: Vascular;  Laterality: Left;  [name] QUADRICEPS TENDON REPAIR  09/19/2012   Procedure: REPAIR QUADRICEP TENDON;  Surgeon: [name], MD;  Location: [facility];  Service: Orthopedics;  Laterality: Left;  [name] VASECTOMY      REVIEW OF SYSTEMS:  Constitutional: positive for fatigue and weight loss Eyes: negative Ears, nose, mouth, throat, and face: negative Respiratory: positive for cough, dyspnea on exertion and pleurisy/chest pain Cardiovascular: negative Gastrointestinal: negative Genitourinary:negative Integument/breast: negative Hematologic/lymphatic: negative Musculoskeletal:positive for muscle weakness Neurological: negative Behavioral/Psych: negative Endocrine: negative Allergic/Immunologic: negative   PHYSICAL EXAMINATION: General appearance: alert, cooperative, fatigued and no distress Head: Normocephalic, without obvious abnormality, atraumatic Neck: no adenopathy, no JVD, supple, symmetrical, trachea midline and thyroid not enlarged, symmetric, no tenderness/mass/nodules Lymph nodes: Cervical, supraclavicular, and axillary nodes normal. Resp: diminished  breath sounds LLL and dullness to percussion LLL Back: symmetric, no curvature. ROM normal. No CVA tenderness. Cardio: regular rate and rhythm, S1, S2 normal, no murmur, click, rub or gallop GI: soft, non-tender; bowel sounds normal; no masses,  no organomegaly Extremities: extremities normal, atraumatic, no cyanosis or edema Neurologic: Alert and oriented X 3, normal strength and tone. Normal symmetric reflexes. Normal coordination and gait  ECOG PERFORMANCE STATUS: 1 - Symptomatic but completely ambulatory  Blood pressure 104/60, pulse 85, temperature 97.8 F (36.6 C), temperature source Tympanic, resp. rate 17, height '6\' 3"'  (1.905 m), weight 206 lb 8 oz (93.7 kg), SpO2 99 %.  LABORATORY DATA: Lab Results  Component Value Date   WBC 4.3 07/17/2020   HGB 10.6 (L) 07/17/2020   HCT  33.3 (L) 07/17/2020   MCV 92.2 07/17/2020   PLT 238 07/17/2020      Chemistry      Component Value Date/Time   NA 140 07/17/2020 1236   NA 140 08/15/2019 0936   K 3.7 07/17/2020 1236   CL 104 07/17/2020 1236   CO2 30 07/17/2020 1236   BUN 30 (H) 07/17/2020 1236   BUN 27 08/15/2019 0936   CREATININE 1.35 (H) 07/17/2020 1236   CREATININE 1.43 (H) 07/29/2016 0814      Component Value Date/Time   CALCIUM 9.4 07/17/2020 1236   ALKPHOS 69 07/17/2020 1236   AST 15 07/17/2020 1236   ALT 13 07/17/2020 1236   BILITOT 0.9 07/17/2020 1236       RADIOGRAPHIC STUDIES: DG Chest 1 View  Result Date: 07/19/2020 CLINICAL DATA:  Post left thoracentesis EXAM: CHEST  1 VIEW COMPARISON:  07/11/2020 CT FINDINGS: Large left pleural effusion remains present. No pneumothorax following thoracentesis. Airspace disease throughout the left lung. Right lung clear. Heart is normal size. IMPRESSION: Large left effusion remains following thoracentesis. No pneumothorax. Electronically Signed   By: Rolm Baptise M.D.   On: 07/19/2020 16:21   CT HEAD WO CONTRAST  Result Date: 06/30/2020 CLINICAL DATA:  Weakness, fall, facial injury, head laceration, blood thinners EXAM: CT HEAD WITHOUT CONTRAST CT MAXILLOFACIAL WITHOUT CONTRAST CT CERVICAL SPINE WITHOUT CONTRAST TECHNIQUE: Multidetector CT imaging of the head, cervical spine, and maxillofacial structures were performed using the standard protocol without intravenous contrast. Multiplanar CT image reconstructions of the cervical spine and maxillofacial structures were also generated. COMPARISON:  None. FINDINGS: CT HEAD FINDINGS Brain: No evidence of acute infarction, hemorrhage, hydrocephalus, extra-axial collection or mass lesion/mass effect. Vascular: No hyperdense vessel or unexpected calcification. CT FACIAL BONES FINDINGS Skull: Normal. Negative for fracture or focal lesion. Facial bones: Minimally displaced fracture of the right nasal bone (series 5, image 84). No  other displaced fractures or dislocations. Sinuses/Orbits: No acute finding. Other: Soft tissue contusion of the chin and nose. CT CERVICAL SPINE FINDINGS Alignment: Normal. Skull base and vertebrae: No acute fracture. No primary bone lesion or focal pathologic process. Soft tissues and spinal canal: No prevertebral fluid or swelling. No visible canal hematoma. Disc levels: Focally mild disc space height loss and osteophytosis of C5-C6. Disc spaces are otherwise intact. Upper chest: Large, partially imaged left pleural effusion. Other: None. IMPRESSION: 1. No acute intracranial pathology. 2. Minimally displaced fracture of the right nasal bone. No other displaced fractures or dislocations of the facial bones. 3. Soft tissue contusion of the chin and nose. 4. No fracture or static subluxation of the cervical spine. 5. Large, partially imaged left pleural effusion. Electronically Signed   By: [name].D.  On: 06/30/2020 12:42   CT CERVICAL SPINE WO CONTRAST  Result Date: 06/30/2020 CLINICAL DATA:  Weakness, fall, facial injury, head laceration, blood thinners EXAM: CT HEAD WITHOUT CONTRAST CT MAXILLOFACIAL WITHOUT CONTRAST CT CERVICAL SPINE WITHOUT CONTRAST TECHNIQUE: Multidetector CT imaging of the head, cervical spine, and maxillofacial structures were performed using the standard protocol without intravenous contrast. Multiplanar CT image reconstructions of the cervical spine and maxillofacial structures were also generated. COMPARISON:  None. FINDINGS: CT HEAD FINDINGS Brain: No evidence of acute infarction, hemorrhage, hydrocephalus, extra-axial collection or mass lesion/mass effect. Vascular: No hyperdense vessel or unexpected calcification. CT FACIAL BONES FINDINGS Skull: Normal. Negative for fracture or focal lesion. Facial bones: Minimally displaced fracture of the right nasal bone (series 5, image 84). No other displaced fractures or dislocations. Sinuses/Orbits: No acute finding. Other: Soft  tissue contusion of the chin and nose. CT CERVICAL SPINE FINDINGS Alignment: Normal. Skull base and vertebrae: No acute fracture. No primary bone lesion or focal pathologic process. Soft tissues and spinal canal: No prevertebral fluid or swelling. No visible canal hematoma. Disc levels: Focally mild disc space height loss and osteophytosis of C5-C6. Disc spaces are otherwise intact. Upper chest: Large, partially imaged left pleural effusion. Other: None. IMPRESSION: 1. No acute intracranial pathology. 2. Minimally displaced fracture of the right nasal bone. No other displaced fractures or dislocations of the facial bones. 3. Soft tissue contusion of the chin and nose. 4. No fracture or static subluxation of the cervical spine. 5. Large, partially imaged left pleural effusion. Electronically Signed   By: Eddie Candle M.D.   On: 06/30/2020 12:42   MR BRAIN W WO CONTRAST  Result Date: 07/26/2020 CLINICAL DATA:  Non-small cell lung carcinoma staging EXAM: MRI HEAD WITHOUT AND WITH CONTRAST TECHNIQUE: Multiplanar, multiecho pulse sequences of the brain and surrounding structures were obtained without and with intravenous contrast. CONTRAST:  59m GADAVIST GADOBUTROL 1 MMOL/ML IV SOLN COMPARISON:  None. FINDINGS: Brain: No acute infarct, acute hemorrhage or extra-axial collection. Normal white matter signal. Normal volume of CSF spaces. No chronic microhemorrhage. Normal midline structures. There is no abnormal contrast enhancement. Vascular: Abnormal left internal carotid artery flow void. Skull and upper cervical spine: Normal marrow signal. Sinuses/Orbits: Negative. Other: None. IMPRESSION: 1. No intracranial metastatic disease. 2. Abnormal left internal carotid artery flow void, consistent with reduced flow or occlusion. Correlation with carotid ultrasound or CTA of the neck is recommended. These results will be called to the ordering clinician or representative by the Radiologist Assistant, and communication  documented in the PACS or [employer] Electronically Signed   By: KUlyses JarredM.D.   On: 07/26/2020 03:33   CT Chest High Resolution  Result Date: 07/11/2020 CLINICAL DATA:  Cough for 5 months. Abnormal chest radiograph. History of amiodarone therapy. History of prostate cancer. EXAM: CT CHEST WITHOUT CONTRAST TECHNIQUE: Multidetector CT imaging of the chest was performed following the standard protocol without intravenous contrast. High resolution imaging of the lungs, as well as inspiratory and expiratory imaging, was performed. COMPARISON:  06/30/2020 chest radiograph. FINDINGS: Cardiovascular: Normal heart size. Small pericardial effusion. Three-vessel coronary atherosclerosis. Atherosclerotic thoracic aorta with ectatic 4.4 cm ascending thoracic aorta. Top-normal caliber main pulmonary artery (3.2 cm diameter). Mediastinum/Nodes: No discrete thyroid nodules. Unremarkable esophagus. No axillary adenopathy. No pathologically enlarged mediastinal or discrete hilar nodes on this noncontrast scan. Lungs/Pleura: No pneumothorax. No right pleural effusion. Large dependent left pleural effusion. Masslike focus of consolidation in posterior central left upper lobe measuring 6.3 x 3.5 cm (series  2/image 50), associated with occlusion of the left upper lobe bronchus. Near complete left lower lobe atelectasis. A few tiny right upper lobe pulmonary nodules, largest 0.2 cm posteriorly (series 7/image 46). No evidence of significant air trapping or tracheobronchomalacia on the expiration sequence. No significant regions of subpleural reticulation, traction bronchiectasis or frank honeycombing. Upper abdomen: Cholelithiasis. Simple 1.2 cm anterior segment 4 left liver cyst. A few additional scattered subcentimeter hypodense liver lesions are too small to characterize. Partially visualized exophytic simple appearing 3.8 cm upper right renal cyst. Cholelithiasis. Musculoskeletal: Lytic 2.3 cm sternal lesion (series  7/image 52). Lytic 3.2 cm posterior right fourth rib lesion (series 7/image 29). Minimally displaced posterior right seventh rib fracture with suggestion of underlying lytic lesion (series 7/image 64). Suggestion of additional indistinct lytic lesions scattered in the thoracic vertebral bodies, for example posteriorly at T11 (series 6/image 92). Mild bilateral gynecomastia. Moderate thoracic spondylosis. IMPRESSION: 1. Masslike 6.3 x 3.5 cm focus of consolidation in the posterior central left upper lobe, associated with occlusion of the left upper lobe bronchus. Findings are worrisome for primary bronchogenic carcinoma. 2. Large dependent left pleural effusion, potentially malignant. 3. Lytic sternal and posterior right fourth rib lesions, suspicious for osseous metastases. Suggestion of pathologic posterior right seventh rib fracture. Suggestion of additional indistinct lytic lesions scattered in the thoracic vertebral bodies. 4. Multidisciplinary thoracic oncology consultation suggested. PET-CT suggested for further evaluation. 5. A few tiny right upper lobe pulmonary nodules, largest 0.2 cm, indeterminate. Recommend attention on follow-up chest CT in 3 months. 6. Small pericardial effusion. 7. Three-vessel coronary atherosclerosis. 8. Ectatic 4.4 cm ascending thoracic aorta. Recommend annual imaging followup by CTA or MRA. This recommendation follows 2010 ACCF/AHA/AATS/ACR/ASA/SCA/SCAI/SIR/STS/SVM Guidelines for the Diagnosis and Management of Patients with Thoracic Aortic Disease. Circulation. 2010; 121: L076-J518. Aortic aneurysm NOS (ICD10-I71.9). 9. Cholelithiasis. 10. Aortic Atherosclerosis (ICD10-I70.0). These results will be called to the ordering clinician or representative by the Radiologist Assistant, and communication documented in the PACS or [employer]. Electronically Signed   By: Ilona Sorrel M.D.   On: 07/11/2020 16:51   NM PET Image Initial (PI) Skull Base To Thigh  Result Date:  07/16/2020 CLINICAL DATA:  Initial treatment strategy for lung mass with effusion. History of treated prostate carcinoma EXAM: NUCLEAR MEDICINE PET SKULL BASE TO THIGH TECHNIQUE: 11.2 mCi F-18 FDG was injected intravenously. Full-ring PET imaging was performed from the skull base to thigh after the radiotracer. CT data was obtained and used for attenuation correction and anatomic localization. Fasting blood glucose: 104 mg/dl COMPARISON:  CT 07/11/2020 FINDINGS: Mediastinal blood pool activity: SUV max 2.9 Liver activity: SUV max 3.3 NECK: No hypermetabolic lymph nodes in the neck. Incidental CT findings: none CHEST: Hypermetabolic activity in the LEFT hilum posteriorly which localizes to the central portion of the atelectatic LEFT lower lobe. No focal mass lesion identified however central ill-defined activity which is intense with SUV max equal 6.3. Hypermetabolic activity also associated centrally within the atelectatic LEFT upper lobe with SUV max equal 5.0. Ill-defined hypermetabolic tissue extends into the AP window seen best on coronal image 159. There is a complete collapse of the LEFT lower lobe. Partial collapse of the LEFT upper lobe. Large LEFT pleural effusion. Mild metabolic activity associated with contralateral RIGHT lower paratracheal node. This node is difficult define on CT portion measuring approximately 1 cm image 75/4 and with moderate metabolic activity SUV max equal 4.2. Mild activity associated with small subcarinal node measuring 6 mm (80/4) with SUV max equal 3.2. No  hypermetabolic supraclavicular adenopathy. Incidental CT findings: No nodularity in the RIGHT lung. ABDOMEN/PELVIS: No focal activity within the liver. No abnormal activity associated with the adrenal glands. No hypermetabolic abdominopelvic lymph nodes. Diffuse metabolic activity associated with the bowel is favored physiologic. No abnormal activity in the prostate gland. Incidental CT findings: Small gallstones noted.  Bilateral benign-appearing low-density renal cysts. Atherosclerotic calcification of the aorta. Mild sigmoid colon diverticulosis. SKELETON: Multifocal hypermetabolic skeletal metastasis. Lytic lesion in the upper sternum measuring 2.1 cm has intense metabolic activity with SUV max equal 8.8. Multiple lesions the cervical, thoracic and lumbar spine. Example lesion in the T12 vertebral body extends into the RIGHT pedicle measuring 2.6 cm with SUV max equal 10.6. Small lucent lesion with peripheral sclerosis measures 10 mm (image 118/series 4) at L1 with SUV max equal 7.0. Multiple bilateral hypermetabolic rib lesions. Probable pathologic fracture on posterior RIGHT rib ninth rib with SUV max equal 6.8. Small lesion anterior aspect of the LEFT second rib with SUV max equals 6.6. Lytic mildly expansile lesion at L4 posterior on the RIGHT measures 2.6 cm (image 64/4) does not have metabolic activity. There is a lucent lesion the central body of the LEFT scapula with SUV max equal 6.9. Lucent lesion within the angle of the LEFT mandible with SUV max equal 6.8. This small lesion measuring 6 mm. (Image 27/4) Incidental CT findings: none IMPRESSION: 1. Hypermetabolic activity in the LEFT hilum involving the central portion of the atelectatic LEFT upper lobe and LEFT lower lobe. Ill-defined hypermetabolic tissue extends into the AP window. Findings are concerning for bronchogenic carcinoma. 2. Postobstructive collapse of the LEFT upper lobe and LEFT lower lobe. Large LEFT pleural effusion occupying 70% of the LEFT hemithorax. Recommend thoracentesis with cytology. 3. Mild metabolic activity of small mediastinal lymph nodes is indeterminate. 4. Multifocal hypermetabolic lytic skeletal metastasis. Differential would include lung cancer skeletal metastasis versus active multiple myeloma. Lytic lesions do not favor prostate cancer metastasis. Lesion in the mandible is typical location for multiple myeloma although given the LEFT  lung findings bronchogenic carcinoma would be favored differential. Recommend tissue sampling (sternum potential site). These results will be called to the ordering clinician or representative by the Radiologist Assistant, and communication documented in the PACS or [employer]. Electronically Signed   By: Suzy Bouchard M.D.   On: 07/16/2020 13:13   DG Chest Portable 1 View  Result Date: 06/30/2020 CLINICAL DATA:  Fall, anticoagulated, arrhythmia EXAM: PORTABLE CHEST 1 VIEW COMPARISON:  03/09/2012 chest radiograph. FINDINGS: Stable cardiomediastinal silhouette with normal heart size. No pneumothorax. Small left pleural effusion. No right pleural effusion. Patchy left perihilar lung opacity with volume loss in the left hemithorax. IMPRESSION: Small left pleural effusion. Patchy left perihilar lung opacity with volume loss in the left hemithorax. Findings could represent aspiration, atelectasis or pneumonia, with underlying central left lung mass not excluded. Recommend further evaluation with chest CT with IV contrast. Electronically Signed   By: Ilona Sorrel M.D.   On: 06/30/2020 12:19   VAS US CAROTID  Result Date: 07/15/2020 Carotid Arterial Duplex Study Indications:       Carotid artery disease, left endarterectomy and Left CE                    10/31/2018. Risk Factors:      Hypertension, no history of smoking, coronary artery disease. Comparison Study:  Prior duplex 12/25/2019 showed a 1-39% RICA stenosis and LICA  occlusion. Performing Technologist: Delorise Shiner RVT  Examination Guidelines: A complete evaluation includes B-mode imaging, spectral Doppler, color Doppler, and power Doppler as needed of all accessible portions of each vessel. Bilateral testing is considered an integral part of a complete examination. Limited examinations for reoccurring indications may be performed as noted.  Right Carotid Findings:  +----------+--------+--------+--------+------------------------+--------+           PSV cm/sEDV cm/sStenosisPlaque Description      Comments +----------+--------+--------+--------+------------------------+--------+ CCA Prox  76      27              hypoechoic and smooth            +----------+--------+--------+--------+------------------------+--------+ CCA Mid   73      24                                               +----------+--------+--------+--------+------------------------+--------+ CCA Distal63      24                                               +----------+--------+--------+--------+------------------------+--------+ ICA Prox  39      14      1-39%   homogeneous and calcific         +----------+--------+--------+--------+------------------------+--------+ ICA Mid   64      27                                               +----------+--------+--------+--------+------------------------+--------+ ICA Distal60      22                                      tortuous +----------+--------+--------+--------+------------------------+--------+ ECA       54      13                                               +----------+--------+--------+--------+------------------------+--------+ +----------+--------+-------+----------------+-------------------+           PSV cm/sEDV cmsDescribe        Arm Pressure (mmHG) +----------+--------+-------+----------------+-------------------+ [id number]      0      Multiphasic, WNL                    +----------+--------+-------+----------------+-------------------+ +---------+--------+--+--------+--+---------+ VertebralPSV cm/s65EDV cm/s32Antegrade +---------+--------+--+--------+--+---------+  Left Carotid Findings: +----------+--------+--------+--------+----------------------+--------+           PSV cm/sEDV cm/sStenosisPlaque Description    Comments  +----------+--------+--------+--------+----------------------+--------+ CCA Prox  67      8                                              +----------+--------+--------+--------+----------------------+--------+ CCA Mid   50      7                                              +----------+--------+--------+--------+----------------------+--------+  CCA Distal94      9       <50%    smooth and homogeneous         +----------+--------+--------+--------+----------------------+--------+ ICA Prox  0       0       Occludedcalcific                       +----------+--------+--------+--------+----------------------+--------+ ICA Mid   0       0       Occludedcalcific                       +----------+--------+--------+--------+----------------------+--------+ ICA Distal0       0       Occludedheterogenous                   +----------+--------+--------+--------+----------------------+--------+ ECA       59      12                                             +----------+--------+--------+--------+----------------------+--------+ +----------+--------+--------+----------------+-------------------+           PSV cm/sEDV cm/sDescribe        Arm Pressure (mmHG) +----------+--------+--------+----------------+-------------------+ [id number]      0       Multiphasic, WNL                    +----------+--------+--------+----------------+-------------------+ +---------+--------+--+--------+--+---------+ VertebralPSV cm/s50EDV cm/s19Antegrade +---------+--------+--+--------+--+---------+   Summary: Right Carotid: Velocities in the right ICA are consistent with a 1-39% stenosis. 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               Non-hemodynamically significant plaque <50% noted in the CCA. Vertebrals:  Bilateral vertebral arteries demonstrate antegrade flow. Subclavians: Normal flow hemodynamics were seen in bilateral subclavian               arteries. *See table(s) above for measurements and observations.  Electronically signed by Harold Barban MD on 07/15/2020 at 3:50:16 PM.    Final    CT MAXILLOFACIAL WO CONTRAST  Result Date: 06/30/2020 CLINICAL DATA:  Weakness, fall, facial injury, head laceration, blood thinners EXAM: CT HEAD WITHOUT CONTRAST CT MAXILLOFACIAL WITHOUT CONTRAST CT CERVICAL SPINE WITHOUT CONTRAST TECHNIQUE: Multidetector CT imaging of the head, cervical spine, and maxillofacial structures were performed using the standard protocol without intravenous contrast. Multiplanar CT image reconstructions of the cervical spine and maxillofacial structures were also generated. COMPARISON:  None. FINDINGS: CT HEAD FINDINGS Brain: No evidence of acute infarction, hemorrhage, hydrocephalus, extra-axial collection or mass lesion/mass effect. Vascular: No hyperdense vessel or unexpected calcification. CT FACIAL BONES FINDINGS Skull: Normal. Negative for fracture or focal lesion. Facial bones: Minimally displaced fracture of the right nasal bone (series 5, image 84). No other displaced fractures or dislocations. Sinuses/Orbits: No acute finding. Other: Soft tissue contusion of the chin and nose. CT CERVICAL SPINE FINDINGS Alignment: Normal. Skull base and vertebrae: No acute fracture. No primary bone lesion or focal pathologic process. Soft tissues and spinal canal: No prevertebral fluid or swelling. No visible canal hematoma. Disc levels: Focally mild disc space height loss and osteophytosis of C5-C6. Disc spaces are otherwise intact. Upper chest: Large, partially imaged left pleural effusion. Other: None. IMPRESSION: 1. No acute intracranial pathology. 2. Minimally displaced fracture of the right nasal bone. No other  displaced fractures or dislocations of the facial bones. 3. Soft tissue contusion of the chin and nose. 4. No fracture or static subluxation of the cervical spine. 5. Large, partially imaged left pleural effusion.  Electronically Signed   By: Eddie Candle M.D.   On: 06/30/2020 12:42   US THORACENTESIS ASP PLEURAL SPACE W/IMG GUIDE  Result Date: 07/19/2020 INDICATION: Patient with prior history of prostate cancer; now with left lung mass, bone lesions, left pleural effusion. Request received for diagnostic and therapeutic left thoracentesis. EXAM: ULTRASOUND GUIDED DIAGNOSTIC AND THERAPEUTIC LEFT THORACENTESIS MEDICATIONS: 1% lidocaine to skin and subcutaneous tissue COMPLICATIONS: None immediate. PROCEDURE: An ultrasound guided thoracentesis was thoroughly discussed with the patient and questions answered. The benefits, risks, alternatives and complications were also discussed. The patient understands and wishes to proceed with the procedure. Written consent was obtained. Ultrasound was performed to localize and mark an adequate pocket of fluid in the left chest. The area was then prepped and draped in the normal sterile fashion. 1% Lidocaine was used for local anesthesia. Under ultrasound guidance a 6 Fr Safe-T-Centesis catheter was introduced. Thoracentesis was performed. The catheter was removed and a dressing applied. FINDINGS: A total of approximately 2.5 liters of blood-tinged fluid was removed. Samples were sent to the laboratory as requested by the clinical team. Due to patient coughing only the above amount of fluid was removed today. IMPRESSION: Successful ultrasound guided diagnostic and therapeutic left thoracentesis yielding 2.5 liters of pleural fluid. Read by: Rowe Robert, PA-C Electronically Signed   By: Corrie Mckusick D.O.   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 I had a lengthy discussion with the patient and his wife today about his current condition and treatment options.  I personally and independently reviewed the scan images and discussed the result and showed the images to the patient and his wife today. His MRI of the brain showed no evidence of metastatic disease to the brain but the PET scan showed extensive disease involving the lung, bones as well as pleural effusion. I recommended for the patient to send blood sample to [facility] for molecular studies and if negative will consider testing the tissue. For the painful metastatic bone lesions, I will refer the patient to Dr. Tammi Klippel for consideration of palliative radiotherapy.  I may also consider the patient for treatment with Xgeva in the future. For the recurrent left pleural effusion, I will refer the patient to cardiothoracic surgery for consideration of left Pleurx catheter placement. I discussed with the patient his treatment options and if he has an actionable mutations, he would be treated with targeted therapy.  If the patient has no actionable mutation he would be considered for either palliative care or palliative systemic chemotherapy with carboplatin, Alimta and Keytruda. I discussed with the patient his prognosis with and without treatment. The patient and his wife are in agreement with the current plan. He will come back for follow-up visit in 2 weeks for evaluation and more detailed discussion of his treatment options based on the molecular studies. He was advised to call  immediately if he has any concerning symptoms in the interval. The patient voices understanding of current disease status and treatment options and is in agreement with the current care plan.  All questions were answered. The patient knows to call the clinic with any problems, questions or concerns. We can  certainly see the patient much sooner if necessary.  The total time spent in the appointment was 55 minutes.  Disclaimer: This note was dictated with voice recognition software. Similar sounding words can inadvertently be transcribed and may not be corrected upon review.

## 2020-07-26 NOTE — Telephone Encounter (Signed)
Received report of MRI Brain from [facility]:  Abnormal L internal carotid flow void consistent with decreased flow or occlusion.  Recommend correlation with carotid US or CTA neck.  Report routed to Dr Julien Nordmann.

## 2020-07-30 ENCOUNTER — Other Ambulatory Visit: Payer: Self-pay | Admitting: Thoracic Surgery (Cardiothoracic Vascular Surgery)

## 2020-07-30 ENCOUNTER — Telehealth: Payer: Self-pay | Admitting: Medical Oncology

## 2020-07-30 DIAGNOSIS — C3492 Malignant neoplasm of unspecified part of left bronchus or lung: Secondary | ICD-10-CM

## 2020-07-30 NOTE — Telephone Encounter (Signed)
Wife notified and she will contact Dr Stephens [facility] office for appt. Message sent to Dr Trula Slade.

## 2020-07-30 NOTE — Telephone Encounter (Signed)
Wife stated Dr Trula Slade told them about the left interanl carotid artery occlusion. Dr Trula Slade told us " that the left carotid  "completely occluded".

## 2020-07-31 ENCOUNTER — Other Ambulatory Visit: Payer: Self-pay | Admitting: *Deleted

## 2020-07-31 ENCOUNTER — Institutional Professional Consult (permissible substitution) (INDEPENDENT_AMBULATORY_CARE_PROVIDER_SITE_OTHER): Payer: Medicare Other | Admitting: Thoracic Surgery (Cardiothoracic Vascular Surgery)

## 2020-07-31 ENCOUNTER — Other Ambulatory Visit: Payer: Self-pay

## 2020-07-31 ENCOUNTER — Ambulatory Visit
Admission: RE | Admit: 2020-07-31 | Discharge: 2020-07-31 | Disposition: A | Discharge status: Home / Self Care | Payer: Medicare Other | Source: Ambulatory Visit | Attending: Thoracic Surgery (Cardiothoracic Vascular Surgery) | Admitting: Thoracic Surgery (Cardiothoracic Vascular Surgery)

## 2020-07-31 VITALS — BP 115/72 | HR 72 | Temp 97.7°F | Resp 20 | Ht 75.0 in | Wt 210.0 lb

## 2020-07-31 DIAGNOSIS — I6523 Occlusion and stenosis of bilateral carotid arteries: Secondary | ICD-10-CM

## 2020-07-31 DIAGNOSIS — J90 Pleural effusion, not elsewhere classified: Secondary | ICD-10-CM

## 2020-07-31 DIAGNOSIS — C3492 Malignant neoplasm of unspecified part of left bronchus or lung: Secondary | ICD-10-CM

## 2020-07-31 NOTE — H&P (View-Only) (Signed)
PCP is [name], MD Referring Provider is Curt Bears, MD  Chief Complaint  Patient presents with  . Pleural Effusion    Surgical consult for PleurX placement with CXR    HPI: Charles Robinson is sent for consultation regarding a malignant left pleural effusion.  Charles Robinson is a 74 year old man with a past medical history significant for hypertension, hyperlipidemia, paroxysmal atrial fibrillation, history of CVA, occluded left carotid, arthritis, CAD with multivessel PCI, and prostate cancer.  Charles Robinson is a lifelong non-smoker.  Charles Robinson complains of a 44-monthhistory of a persistent cough, shortness of breath, and a 67 pound weight loss (according to his wife).  Charles Robinson presented to the emergency room with generalized weakness after a fall including a facial laceration and head injury.  Chest x-ray showed a small left pleural effusion and patchy left perihilar opacity.  A chest CT was done on 07/11/2020 which showed a large left pleural effusion.  There was masslike consolidation of the left upper lobe measuring 6.3 x 3.5 cm.  The left upper lobe bronchus was occluded.  A PET/CT was done on 07/16/2020.  It showed hypermetabolic activity in the left hilum extending into the AP window concerning for bronchogenic carcinoma.  There was postobstructive collapse of the left upper lobe and a large left pleural effusion.  There were also multiple bone metastases.  Thoracentesis was performed draining 2.5 L of bloody fluid.  Cytology was positive for adenocarcinoma.  Charles Robinson did get some symptomatic relief from his thoracentesis.  The pleural effusion was incompletely drained and has partially reaccumulated.  Charles Robinson does have a persistent cough still.  Zubrod Score: At the time of surgery this patient's most appropriate activity status/level should be described as: '[]'     0    Normal activity, no symptoms '[]'     1    Restricted in physical strenuous activity but ambulatory, able to do out light work '[x]'     2    Ambulatory and  capable of self care, unable to do work activities, up and about >50 % of waking hours                              '[]'     3    Only limited self care, in bed greater than 50% of waking hours '[]'     4    Completely disabled, no self care, confined to bed or chair '[]'     5    Moribund  Past Medical History:  Diagnosis Date  . Arthritis    back  . BPH (benign prostatic hypertrophy)   . CAD S/P multivessel PCI: LAD, RCA and extensive LCX-OM1 03/15/2012   s/p PCI to LAD, to RCA (now occluded), then extensive PCI to Dominant LCx-OM1  (enitre prox-AVG Circ for dissection in 07/2011)  . Cancer ([facility]    SKIN CA  . GERD (gastroesophageal reflux disease)   . History of colon polyps   . History of echocardiogram 08/16/2011   Echo - EF 60-65%; moderate LV concentric hypertrophy; abdnormal LV relaxation (grade 1 diastolic dysfunction; ascending aorta mildly dilated; LA moderately dilated;   . Hyperlipidemia   . Hypertension   . 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 PAF (paroxysmal atrial fibrillation) ([facility] 12/20/2012   followed by Dr. [name] s/p DCCV 2011, 07/2011 post PCI with Type 4a MI; CHA2DS2Vasc = 3, on Warfarin  .  Paroxysmal atrial flutter ([place]) 08/20/2011   TEE- atrial septum - no defect identified; RA normal in size, no evidence of thrombus; ascending aorta normal  . Prostate cancer ([place])   . Rupture quadriceps tendon     Past Surgical History:  Procedure Laterality Date  . BACK SURGERY    . COLONOSCOPY    . CORONARY ANGIOPLASTY WITH STENT PLACEMENT  03/15/2012   multiple stens in AVGroove Circ & OM1;; 2 site PTCA w/ PTCA and stenting of OM2 ang PTCA of distal stent ISR followed by PTCA of mid LCx ISR  . CORONARY STENT PLACEMENT  2003 - 2011 - 2012 - 2013   Pre-2012 - BMS in RCA now known occluded, BMS in proximal LAD; 07/2011 - 2.25 x 23 BMS in OM 1 with significant disease on either side noted  shortly after -->  2 additional overlapping DES 2.5 mm x 30 mm and 2.25 mm x 26 mm in OM1,; 3 overlapping Resolute DES in AV groove Cx crossing OM1: (Tapered from 3.8-2.6 mm)- Resolute DES 3.5 x 22, 3.0 x 38, 2.5 x 14  . DOPPLER ECHOCARDIOGRAPHY  07/2011   EF ~60-65%, Grade 1 D Dysfxn; mod Conc LVH  . ENDARTERECTOMY Left 02/27/2016   Procedure: LEFT  CAROTID ARTERY ENDARTERECTOMY ;  Surgeon: Serafina Mitchell, MD;  Location: [facility];  Service: Vascular;  Laterality: Left;  . LEFT HEART CATHETERIZATION WITH CORONARY ANGIOGRAM N/A 03/15/2012   Procedure: LEFT HEART CATHETERIZATION WITH CORONARY ANGIOGRAM;  Surgeon: [name], MD;  Location: [facility] CATH LAB: patent LAD stents, patent LCx stents w/80% focal ISR just distal to OM; OM proximal stent open; -- 2 site PTCA-PCI  . LUMBAR LAMINECTOMY/DECOMPRESSION MICRODISCECTOMY Bilateral 12/11/2014   Procedure: Bilateral Lumbar Three-Four Laminectomy;  Surgeon: Kristeen Miss, MD;  Location: [facility];  Service: Neurosurgery;  Laterality: Bilateral;  bilateral  . NM MYOVIEW LTD  11/2012   No ischemia or infarction, normal EF & WM  . PATCH ANGIOPLASTY Left 02/27/2016   Procedure: WITH HEMASHIELD DACRON  PATCH ANGIOPLASTY;  Surgeon: Serafina Mitchell, MD;  Location: [facility];  Service: Vascular;  Laterality: Left;  [name] QUADRICEPS TENDON REPAIR  09/19/2012   Procedure: REPAIR QUADRICEP TENDON;  Surgeon: [name], MD;  Location: [facility];  Service: Orthopedics;  Laterality: Left;  [name] VASECTOMY      Family History  Problem Relation Age of Onset  . Leukemia Mother   . Heart disease Father   . Bladder Cancer Sister   . Prostate cancer Brother   . Breast cancer Maternal Grandmother     Social History Social History   Tobacco Use  . Smoking status: Never Smoker  . Smokeless tobacco: Never Used  Vaping Use  . Vaping Use: Never used  Substance Use Topics  . Alcohol use: Yes    Alcohol/week: 2.0 standard drinks    Types: 2 Cans of beer per week    Comment: rare   . Drug use: No    Current Outpatient Medications  Medication Sig Dispense Refill  . amiodarone (PACERONE) 200 MG tablet Take 0.5 tablets (100 mg total) by mouth daily. 90 tablet 3  . Coenzyme Q10 200 MG capsule Take 200 mg by mouth every morning.     [name] ELIQUIS 5 MG TABS tablet TAKE 1 TABLET BY MOUTH TWICE A DAY 180 tablet 2  . ezetimibe (ZETIA) 10 MG tablet TAKE 1 TABLET BY MOUTH EVERY DAY 90 tablet 2  . finasteride (PROSCAR) 5 MG tablet Take  5 mg by mouth every evening.     [name] Leuprolide Acetate (ELIGARD [place]) Inject into the skin.    . metoprolol tartrate (LOPRESSOR) 25 MG tablet Take 1 tablet (25 mg total) by mouth 2 (two) times daily. 180 tablet 3  . Multiple Vitamin (MULTIVITAMIN WITH MINERALS) TABS tablet Take 1 tablet by mouth every morning.     . nitroGLYCERIN (NITROSTAT) 0.4 MG SL tablet PLACE 1 TABLET (0.4 MG TOTAL) UNDER THE TONGUE EVERY 5 (FIVE) MINUTES AS NEEDED. FOR CHEST PAIN 25 tablet 4  . olmesartan (BENICAR) 20 MG tablet Take 20 mg by mouth daily.    . Omega-3 Fatty Acids (FISH OIL) 1000 MG CAPS Take 1,000 capsules by mouth 2 (two) times daily.     . rosuvastatin (CRESTOR) 40 MG tablet TAKE 1 TABLET BY MOUTH EVERY DAY 90 tablet 1  . tamsulosin (FLOMAX) 0.4 MG CAPS capsule Take 0.4 mg by mouth daily.    . VENTOLIN HFA 108 (90 Base) MCG/ACT inhaler Inhale into the lungs. (Patient not taking: Reported on 07/31/2020)     No current facility-administered medications for this visit.    Allergies  Allergen Reactions  . Lipitor [Atorvastatin] Other (See Comments)    Increased HR is currently tolerating --   . Morphine And Related Nausea And Vomiting  . Vicodin [Hydrocodone-Acetaminophen] Other (See Comments)    Feel crazy    Review of Systems  Constitutional: Positive for activity change, appetite change, fatigue and unexpected weight change. Negative for fever.  HENT: Positive for voice change. Negative for trouble swallowing.   Eyes: Negative for visual disturbance.   Respiratory: Positive for cough and shortness of breath.   Cardiovascular: Negative for chest pain.  Musculoskeletal: Positive for arthralgias.    BP 115/72   Pulse 72   Temp 97.7 F (36.5 C) (Skin)   Resp 20   Ht '6\' 3"'  (1.905 m)   Wt 210 lb (95.3 kg)   SpO2 98% Comment: RA  BMI 26.25 kg/m  Physical Exam Vitals reviewed.  Constitutional:      General: Charles Robinson is not in acute distress.    Appearance: Normal appearance. Charles Robinson is ill-appearing.  HENT:     Head: Normocephalic and atraumatic.  Eyes:     General: No scleral icterus.    Extraocular Movements: Extraocular movements intact.  Cardiovascular:     Rate and Rhythm: Normal rate and regular rhythm.     Heart sounds: Murmur (2/6 systolic) heard.   Pulmonary:     Effort: Pulmonary effort is normal.     Breath sounds: No wheezing or rales.     Comments: Absent breath sounds lower two thirds left side Abdominal:     General: There is no distension.     Palpations: Abdomen is soft.     Tenderness: There is no abdominal tenderness.  Lymphadenopathy:     Cervical: No cervical adenopathy.  Skin:    General: Skin is warm and dry.  Neurological:     General: No focal deficit present.     Mental Status: Charles Robinson is alert and oriented to person, place, and time.     Cranial Nerves: No cranial nerve deficit.     Motor: No weakness.    Diagnostic Tests: NUCLEAR MEDICINE PET SKULL BASE TO THIGH  TECHNIQUE: 11.2 mCi F-18 FDG was injected intravenously. Full-ring PET imaging was performed from the skull base to thigh after the radiotracer. CT data was obtained and used for attenuation correction and anatomic localization.  Fasting blood  glucose: 104 mg/dl  COMPARISON:  CT 07/11/2020  FINDINGS: Mediastinal blood pool activity: SUV max 2.9  Liver activity: SUV max 3.3  NECK: No hypermetabolic lymph nodes in the neck.  Incidental CT findings: none  CHEST: Hypermetabolic activity in the LEFT hilum posteriorly  which localizes to the central portion of the atelectatic LEFT lower lobe. No focal mass lesion identified however central ill-defined activity which is intense with SUV max equal 6.3. Hypermetabolic activity also associated centrally within the atelectatic LEFT upper lobe with SUV max equal 5.0.  Ill-defined hypermetabolic tissue extends into the AP window seen best on coronal image 159.  There is a complete collapse of the LEFT lower lobe. Partial collapse of the LEFT upper lobe.  Large LEFT pleural effusion.  Mild metabolic activity associated with contralateral RIGHT lower paratracheal node. This node is difficult define on CT portion measuring approximately 1 cm image 75/4 and with moderate metabolic activity SUV max equal 4.2. Mild activity associated with small subcarinal node measuring 6 mm (80/4) with SUV max equal 3.2.  No hypermetabolic supraclavicular adenopathy.  Incidental CT findings: No nodularity in the RIGHT lung.  ABDOMEN/PELVIS: No focal activity within the liver.  No abnormal activity associated with the adrenal glands. No hypermetabolic abdominopelvic lymph nodes.  Diffuse metabolic activity associated with the bowel is favored physiologic.  No abnormal activity in the prostate gland.  Incidental CT findings: Small gallstones noted. Bilateral benign-appearing low-density renal cysts. Atherosclerotic calcification of the aorta. Mild sigmoid colon diverticulosis.  SKELETON: Multifocal hypermetabolic skeletal metastasis.  Lytic lesion in the upper sternum measuring 2.1 cm has intense metabolic activity with SUV max equal 8.8.  Multiple lesions the cervical, thoracic and lumbar spine. Example lesion in the T12 vertebral body extends into the RIGHT pedicle measuring 2.6 cm with SUV max equal 10.6.  Small lucent lesion with peripheral sclerosis measures 10 mm (image 118/series 4) at L1 with SUV max equal 7.0.  Multiple bilateral  hypermetabolic rib lesions. Probable pathologic fracture on posterior RIGHT rib ninth rib with SUV max equal 6.8.  Small lesion anterior aspect of the LEFT second rib with SUV max equals 6.6.  Lytic mildly expansile lesion at L4 posterior on the RIGHT measures 2.6 cm (image 64/4) does not have metabolic activity.  There is a lucent lesion the central body of the LEFT scapula with SUV max equal 6.9.  Lucent lesion within the angle of the LEFT mandible with SUV max equal 6.8. This small lesion measuring 6 mm. (Image 27/4)  Incidental CT findings: none  IMPRESSION: 1. Hypermetabolic activity in the LEFT hilum involving the central portion of the atelectatic LEFT upper lobe and LEFT lower lobe. Ill-defined hypermetabolic tissue extends into the AP window. Findings are concerning for bronchogenic carcinoma. 2. Postobstructive collapse of the LEFT upper lobe and LEFT lower lobe. Large LEFT pleural effusion occupying 70% of the LEFT hemithorax. Recommend thoracentesis with cytology. 3. Mild metabolic activity of small mediastinal lymph nodes is indeterminate. 4. Multifocal hypermetabolic lytic skeletal metastasis. Differential would include lung cancer skeletal metastasis versus active multiple myeloma. Lytic lesions do not favor prostate cancer metastasis. Lesion in the mandible is typical location for multiple myeloma although given the LEFT lung findings bronchogenic carcinoma would be favored differential. Recommend tissue sampling (sternum potential site).  These results will be called to the ordering clinician or representative by the Radiologist Assistant, and communication documented in the PACS or [employer].   Electronically Signed   By: Suzy Bouchard M.D.   On:  07/16/2020 13:13 CHEST - 2 VIEW  COMPARISON:  Chest x-ray 07/19/2020  FINDINGS: Moderate to large left pleural effusion is noted approximately halfway up the thorax. Overlying  atelectasis is again demonstrated. Stable fullness in the left hilum and AP window may be residual tumor or radiation change.  The right lung is grossly clear.  Remote rib fractures are noted.  IMPRESSION: 1. Moderate to large left pleural effusion. 2. Stable left hilar and AP window fullness.   Electronically Signed   By: Marijo Sanes M.D.   On: 07/31/2020 15:17 I personally reviewed the CT, PET/CT, and chest x-ray images and concur with the findings noted above  Impression: Charles Robinson is a 74 year old man with recently diagnosed stage IV lung cancer with a malignant left pleural effusion.  Recently had a thoracentesis which drained 2.5 L of bloody fluid.  Charles Robinson did have some symptomatic relief and there was partial reexpansion of the lung.  There was still a significant amount of fluid left afterwards.  A follow-up chest x-ray today shows some reaccumulation of the effusion.  I discussed pleural catheter placement with Mr. and Mrs. Labonte.  They understand this is for palliation of his pleural effusion.  I described the procedure to them.  We would plan to do this in the operating room with local anesthesia and intravenous sedation.  We would plan to do it on an outpatient basis.  I informed them of the indications, risks, benefits, and alternatives.  Charles Robinson understands the risks include but not limited to bleeding, infection, catheter malposition, and catheter occlusion.  Charles Robinson understands that infection may result in need for catheter removal and/or surgical intervention.  They also understand there is no guarantee that we will be complete reexpansion of the lung.  Charles Robinson accepts the risk and wishes to proceed.  Charles Robinson is on Eliquis for atrial fibrillation.  We need to hold that for 48 hours prior to the procedure.  Charles Robinson will stop that after his morning dose on Saturday, 08/03/2020  Plan: Left pleural catheter placement on Monday, 08/05/2020 Hold Eliquis for 48 hours prior to procedure  Melrose Nakayama, MD [facility] [phone]

## 2020-07-31 NOTE — Patient Instructions (Signed)
STOP Eliquis (apixiban) after dose on Saturday morning

## 2020-07-31 NOTE — Progress Notes (Signed)
PCP is [name], MD Referring Provider is Curt Bears, MD  Chief Complaint  Patient presents with  . Pleural Effusion    Surgical consult for PleurX placement with CXR    HPI: [name] is sent for consultation regarding a malignant left pleural effusion.  Charles Robinson is a 74 year old man with a past medical history significant for hypertension, hyperlipidemia, paroxysmal atrial fibrillation, history of CVA, occluded left carotid, arthritis, CAD with multivessel PCI, and prostate cancer.  He is a lifelong non-smoker.  He complains of a 54-monthhistory of a persistent cough, shortness of breath, and a 67 pound weight loss (according to his wife).  He presented to the emergency room with generalized weakness after a fall including a facial laceration and head injury.  Chest x-ray showed a small left pleural effusion and patchy left perihilar opacity.  A chest CT was done on 07/11/2020 which showed a large left pleural effusion.  There was masslike consolidation of the left upper lobe measuring 6.3 x 3.5 cm.  The left upper lobe bronchus was occluded.  A PET/CT was done on 07/16/2020.  It showed hypermetabolic activity in the left hilum extending into the AP window concerning for bronchogenic carcinoma.  There was postobstructive collapse of the left upper lobe and a large left pleural effusion.  There were also multiple bone metastases.  Thoracentesis was performed draining 2.5 L of bloody fluid.  Cytology was positive for adenocarcinoma.  He did get some symptomatic relief from his thoracentesis.  The pleural effusion was incompletely drained and has partially reaccumulated.  He does have a persistent cough still.  Zubrod Score: At the time of surgery this patient's most appropriate activity status/level should be described as: '[]'     0    Normal activity, no symptoms '[]'     1    Restricted in physical strenuous activity but ambulatory, able to do out light work '[x]'     2    Ambulatory and  capable of self care, unable to do work activities, up and about >50 % of waking hours                              '[]'     3    Only limited self care, in bed greater than 50% of waking hours '[]'     4    Completely disabled, no self care, confined to bed or chair '[]'     5    Moribund  Past Medical History:  Diagnosis Date  . Arthritis    back  . BPH (benign prostatic hypertrophy)   . CAD S/P multivessel PCI: LAD, RCA and extensive LCX-OM1 03/15/2012   s/p PCI to LAD, to RCA (now occluded), then extensive PCI to Dominant LCx-OM1  (enitre prox-AVG Circ for dissection in 07/2011)  . Cancer [place]    SKIN CA  . GERD (gastroesophageal reflux disease)   . History of colon polyps   . History of echocardiogram 08/16/2011   Echo - EF 60-65%; moderate LV concentric hypertrophy; abdnormal LV relaxation (grade 1 diastolic dysfunction; ascending aorta mildly dilated; LA moderately dilated;   . Hyperlipidemia   . Hypertension   . 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 PAF (paroxysmal atrial fibrillation) ([place] 12/20/2012   followed by Dr. [name] s/p DCCV 2011, 07/2011 post PCI with Type 4a MI; CHA2DS2Vasc = 3, on Warfarin  .  Paroxysmal atrial flutter ([place]) 08/20/2011   TEE- atrial septum - no defect identified; RA normal in size, no evidence of thrombus; ascending aorta normal  . Prostate cancer ([place])   . Rupture quadriceps tendon     Past Surgical History:  Procedure Laterality Date  . BACK SURGERY    . COLONOSCOPY    . CORONARY ANGIOPLASTY WITH STENT PLACEMENT  03/15/2012   multiple stens in AVGroove Circ & OM1;; 2 site PTCA w/ PTCA and stenting of OM2 ang PTCA of distal stent ISR followed by PTCA of mid LCx ISR  . CORONARY STENT PLACEMENT  2003 - 2011 - 2012 - 2013   Pre-2012 - BMS in RCA now known occluded, BMS in proximal LAD; 07/2011 - 2.25 x 23 BMS in OM 1 with significant disease on either side noted  shortly after -->  2 additional overlapping DES 2.5 mm x 30 mm and 2.25 mm x 26 mm in OM1,; 3 overlapping Resolute DES in AV groove Cx crossing OM1: (Tapered from 3.8-2.6 mm)- Resolute DES 3.5 x 22, 3.0 x 38, 2.5 x 14  . DOPPLER ECHOCARDIOGRAPHY  07/2011   EF ~60-65%, Grade 1 D Dysfxn; mod Conc LVH  . ENDARTERECTOMY Left 02/27/2016   Procedure: LEFT  CAROTID ARTERY ENDARTERECTOMY ;  Surgeon: Serafina Mitchell, MD;  Location: [facility];  Service: Vascular;  Laterality: Left;  . LEFT HEART CATHETERIZATION WITH CORONARY ANGIOGRAM N/A 03/15/2012   Procedure: LEFT HEART CATHETERIZATION WITH CORONARY ANGIOGRAM;  Surgeon: [name], MD;  Location: [facility] CATH LAB: patent LAD stents, patent LCx stents w/80% focal ISR just distal to OM; OM proximal stent open; -- 2 site PTCA-PCI  . LUMBAR LAMINECTOMY/DECOMPRESSION MICRODISCECTOMY Bilateral 12/11/2014   Procedure: Bilateral Lumbar Three-Four Laminectomy;  Surgeon: Kristeen Miss, MD;  Location: [facility] NEURO ORS;  Service: Neurosurgery;  Laterality: Bilateral;  bilateral  . NM MYOVIEW LTD  11/2012   No ischemia or infarction, normal EF & WM  . PATCH ANGIOPLASTY Left 02/27/2016   Procedure: WITH HEMASHIELD DACRON  PATCH ANGIOPLASTY;  Surgeon: Serafina Mitchell, MD;  Location: [facility];  Service: Vascular;  Laterality: Left;  [name] QUADRICEPS TENDON REPAIR  09/19/2012   Procedure: REPAIR QUADRICEP TENDON;  Surgeon: [name], MD;  Location: [facility];  Service: Orthopedics;  Laterality: Left;  [name] VASECTOMY      Family History  Problem Relation Age of Onset  . Leukemia Mother   . Heart disease Father   . Bladder Cancer Sister   . Prostate cancer Brother   . Breast cancer Maternal Grandmother     Social History Social History   Tobacco Use  . Smoking status: Never Smoker  . Smokeless tobacco: Never Used  Vaping Use  . Vaping Use: Never used  Substance Use Topics  . Alcohol use: Yes    Alcohol/week: 2.0 standard drinks    Types: 2 Cans of beer per week    Comment: rare   . Drug use: No    Current Outpatient Medications  Medication Sig Dispense Refill  . amiodarone (PACERONE) 200 MG tablet Take 0.5 tablets (100 mg total) by mouth daily. 90 tablet 3  . Coenzyme Q10 200 MG capsule Take 200 mg by mouth every morning.     [name] ELIQUIS 5 MG TABS tablet TAKE 1 TABLET BY MOUTH TWICE A DAY 180 tablet 2  . ezetimibe (ZETIA) 10 MG tablet TAKE 1 TABLET BY MOUTH EVERY DAY 90 tablet 2  . finasteride (PROSCAR) 5 MG tablet Take  5 mg by mouth every evening.     [name] Leuprolide Acetate (ELIGARD [place]) Inject into the skin.    . metoprolol tartrate (LOPRESSOR) 25 MG tablet Take 1 tablet (25 mg total) by mouth 2 (two) times daily. 180 tablet 3  . Multiple Vitamin (MULTIVITAMIN WITH MINERALS) TABS tablet Take 1 tablet by mouth every morning.     . nitroGLYCERIN (NITROSTAT) 0.4 MG SL tablet PLACE 1 TABLET (0.4 MG TOTAL) UNDER THE TONGUE EVERY 5 (FIVE) MINUTES AS NEEDED. FOR CHEST PAIN 25 tablet 4  . olmesartan (BENICAR) 20 MG tablet Take 20 mg by mouth daily.    . Omega-3 Fatty Acids (FISH OIL) 1000 MG CAPS Take 1,000 capsules by mouth 2 (two) times daily.     . rosuvastatin (CRESTOR) 40 MG tablet TAKE 1 TABLET BY MOUTH EVERY DAY 90 tablet 1  . tamsulosin (FLOMAX) 0.4 MG CAPS capsule Take 0.4 mg by mouth daily.    . VENTOLIN HFA 108 (90 Base) MCG/ACT inhaler Inhale into the lungs. (Patient not taking: Reported on 07/31/2020)     No current facility-administered medications for this visit.    Allergies  Allergen Reactions  . Lipitor [Atorvastatin] Other (See Comments)    Increased HR is currently tolerating --   . Morphine And Related Nausea And Vomiting  . Vicodin [Hydrocodone-Acetaminophen] Other (See Comments)    Feel crazy    Review of Systems  Constitutional: Positive for activity change, appetite change, fatigue and unexpected weight change. Negative for fever.  HENT: Positive for voice change. Negative for trouble swallowing.   Eyes: Negative for visual disturbance.   Respiratory: Positive for cough and shortness of breath.   Cardiovascular: Negative for chest pain.  Musculoskeletal: Positive for arthralgias.    BP 115/72   Pulse 72   Temp 97.7 F (36.5 C) (Skin)   Resp 20   Ht '6\' 3"'  (1.905 m)   Wt 210 lb (95.3 kg)   SpO2 98% Comment: RA  BMI 26.25 kg/m  Physical Exam Vitals reviewed.  Constitutional:      General: He is not in acute distress.    Appearance: Normal appearance. He is ill-appearing.  HENT:     Head: Normocephalic and atraumatic.  Eyes:     General: No scleral icterus.    Extraocular Movements: Extraocular movements intact.  Cardiovascular:     Rate and Rhythm: Normal rate and regular rhythm.     Heart sounds: Murmur (2/6 systolic) heard.   Pulmonary:     Effort: Pulmonary effort is normal.     Breath sounds: No wheezing or rales.     Comments: Absent breath sounds lower two thirds left side Abdominal:     General: There is no distension.     Palpations: Abdomen is soft.     Tenderness: There is no abdominal tenderness.  Lymphadenopathy:     Cervical: No cervical adenopathy.  Skin:    General: Skin is warm and dry.  Neurological:     General: No focal deficit present.     Mental Status: He is alert and oriented to person, place, and time.     Cranial Nerves: No cranial nerve deficit.     Motor: No weakness.    Diagnostic Tests: NUCLEAR MEDICINE PET SKULL BASE TO THIGH  TECHNIQUE: 11.2 mCi F-18 FDG was injected intravenously. Full-ring PET imaging was performed from the skull base to thigh after the radiotracer. CT data was obtained and used for attenuation correction and anatomic localization.  Fasting blood  glucose: 104 mg/dl  COMPARISON:  CT 07/11/2020  FINDINGS: Mediastinal blood pool activity: SUV max 2.9  Liver activity: SUV max 3.3  NECK: No hypermetabolic lymph nodes in the neck.  Incidental CT findings: none  CHEST: Hypermetabolic activity in the LEFT hilum posteriorly  which localizes to the central portion of the atelectatic LEFT lower lobe. No focal mass lesion identified however central ill-defined activity which is intense with SUV max equal 6.3. Hypermetabolic activity also associated centrally within the atelectatic LEFT upper lobe with SUV max equal 5.0.  Ill-defined hypermetabolic tissue extends into the AP window seen best on coronal image 159.  There is a complete collapse of the LEFT lower lobe. Partial collapse of the LEFT upper lobe.  Large LEFT pleural effusion.  Mild metabolic activity associated with contralateral RIGHT lower paratracheal node. This node is difficult define on CT portion measuring approximately 1 cm image 75/4 and with moderate metabolic activity SUV max equal 4.2. Mild activity associated with small subcarinal node measuring 6 mm (80/4) with SUV max equal 3.2.  No hypermetabolic supraclavicular adenopathy.  Incidental CT findings: No nodularity in the RIGHT lung.  ABDOMEN/PELVIS: No focal activity within the liver.  No abnormal activity associated with the adrenal glands. No hypermetabolic abdominopelvic lymph nodes.  Diffuse metabolic activity associated with the bowel is favored physiologic.  No abnormal activity in the prostate gland.  Incidental CT findings: Small gallstones noted. Bilateral benign-appearing low-density renal cysts. Atherosclerotic calcification of the aorta. Mild sigmoid colon diverticulosis.  SKELETON: Multifocal hypermetabolic skeletal metastasis.  Lytic lesion in the upper sternum measuring 2.1 cm has intense metabolic activity with SUV max equal 8.8.  Multiple lesions the cervical, thoracic and lumbar spine. Example lesion in the T12 vertebral body extends into the RIGHT pedicle measuring 2.6 cm with SUV max equal 10.6.  Small lucent lesion with peripheral sclerosis measures 10 mm (image 118/series 4) at L1 with SUV max equal 7.0.  Multiple bilateral  hypermetabolic rib lesions. Probable pathologic fracture on posterior RIGHT rib ninth rib with SUV max equal 6.8.  Small lesion anterior aspect of the LEFT second rib with SUV max equals 6.6.  Lytic mildly expansile lesion at L4 posterior on the RIGHT measures 2.6 cm (image 64/4) does not have metabolic activity.  There is a lucent lesion the central body of the LEFT scapula with SUV max equal 6.9.  Lucent lesion within the angle of the LEFT mandible with SUV max equal 6.8. This small lesion measuring 6 mm. (Image 27/4)  Incidental CT findings: none  IMPRESSION: 1. Hypermetabolic activity in the LEFT hilum involving the central portion of the atelectatic LEFT upper lobe and LEFT lower lobe. Ill-defined hypermetabolic tissue extends into the AP window. Findings are concerning for bronchogenic carcinoma. 2. Postobstructive collapse of the LEFT upper lobe and LEFT lower lobe. Large LEFT pleural effusion occupying 70% of the LEFT hemithorax. Recommend thoracentesis with cytology. 3. Mild metabolic activity of small mediastinal lymph nodes is indeterminate. 4. Multifocal hypermetabolic lytic skeletal metastasis. Differential would include lung cancer skeletal metastasis versus active multiple myeloma. Lytic lesions do not favor prostate cancer metastasis. Lesion in the mandible is typical location for multiple myeloma although given the LEFT lung findings bronchogenic carcinoma would be favored differential. Recommend tissue sampling (sternum potential site).  These results will be called to the ordering clinician or representative by the Radiologist Assistant, and communication documented in the PACS or [employer].   Electronically Signed   By: Suzy Bouchard M.D.   On:  07/16/2020 13:13 CHEST - 2 VIEW  COMPARISON:  Chest x-ray 07/19/2020  FINDINGS: Moderate to large left pleural effusion is noted approximately halfway up the thorax. Overlying  atelectasis is again demonstrated. Stable fullness in the left hilum and AP window may be residual tumor or radiation change.  The right lung is grossly clear.  Remote rib fractures are noted.  IMPRESSION: 1. Moderate to large left pleural effusion. 2. Stable left hilar and AP window fullness.   Electronically Signed   By: Marijo Sanes M.D.   On: 07/31/2020 15:17 I personally reviewed the CT, PET/CT, and chest x-ray images and concur with the findings noted above  Impression: Charles Robinson is a 74 year old man with recently diagnosed stage IV lung cancer with a malignant left pleural effusion.  Recently had a thoracentesis which drained 2.5 L of bloody fluid.  He did have some symptomatic relief and there was partial reexpansion of the lung.  There was still a significant amount of fluid left afterwards.  A follow-up chest x-ray today shows some reaccumulation of the effusion.  I discussed pleural catheter placement with Mr. and Mrs. Rothlisberger.  They understand this is for palliation of his pleural effusion.  I described the procedure to them.  We would plan to do this in the operating room with local anesthesia and intravenous sedation.  We would plan to do it on an outpatient basis.  I informed them of the indications, risks, benefits, and alternatives.  He understands the risks include but not limited to bleeding, infection, catheter malposition, and catheter occlusion.  He understands that infection may result in need for catheter removal and/or surgical intervention.  They also understand there is no guarantee that we will be complete reexpansion of the lung.  He accepts the risk and wishes to proceed.  He is on Eliquis for atrial fibrillation.  We need to hold that for 48 hours prior to the procedure.  He will stop that after his morning dose on Saturday, 08/03/2020  Plan: Left pleural catheter placement on Monday, 08/05/2020 Hold Eliquis for 48 hours prior to procedure  Melrose Nakayama, MD [facility] [phone]

## 2020-08-01 NOTE — Pre-Procedure Instructions (Signed)
[id number] [place], [place] [address] [place] [address] Phone: [phone] Fax: [phone]  [id number] [place], [place] [address] Phone: [phone] Fax: [phone]  [facility] [place], [place] [address] Phone: [phone] Fax: [phone]      Your procedure is scheduled on Monday September 13th.  Report to [facility] Main Entrance "A" at 1145 A.M., and check in at the Admitting office.  Call this number if you have problems the morning of surgery:  [phone]  Call [phone] if you have any questions prior to your surgery date Monday-Friday 8am-4pm    Remember:  Do not eat after midnight the night before your surgery  You may drink clear liquids until 1045am the morning of your surgery.   Clear liquids allowed are: Water, Non-Citrus Juices (without pulp), Carbonated Beverages, Clear Tea, Black Coffee Only, and Gatorade    Take these medicines the morning of surgery with A SIP OF WATER   amiodarone (PACERONE) 200 MG tablet   ezetimibe (ZETIA) 10 MG tablet  metoprolol tartrate (LOPRESSOR) 25 MG tablet  rosuvastatin (CRESTOR) 40 MG tablet  tamsulosin (FLOMAX) 0.4 MG CAPS capsule   IF NEEDED  nitroGLYCERIN (NITROSTAT) 0.4 MG SL tablet   As of today, STOP taking any Aspirin (unless otherwise instructed by your surgeon) Aleve, Naproxen, Ibuprofen, Motrin, Advil, Goody's, BC's, all herbal medications, fish oil, and all vitamins.                      Do not wear jewelry            Do not wear lotions, powders, colognes, or deodorant.            Do not shave 48 hours prior to surgery.  Men may shave face and neck.            Do not bring valuables to  the hospital.            [facility] is not responsible for any belongings or valuables.  Do NOT Smoke (Tobacco/Vaping) or drink Alcohol 24 hours prior to your procedure If you use a CPAP at night, you may bring all equipment for your overnight stay.   Contacts, glasses, dentures or bridgework may not be worn into surgery.      For patients admitted to the hospital, discharge time will be determined by your treatment team.   Patients discharged the day of surgery will not be allowed to drive home, and someone needs to stay with them for 24 hours.    Special instructions:   [facility]- Preparing For Surgery  Before surgery, you can play an important role. Because skin is not sterile, your skin needs to be as free of germs as possible. You can reduce the number of germs on your skin by washing with CHG (chlorahexidine gluconate) Soap before surgery.  CHG is an antiseptic cleaner which kills germs and bonds with the skin to continue killing germs even after washing.    Oral Hygiene is also important to reduce your risk of infection.  Remember - BRUSH YOUR TEETH THE MORNING OF SURGERY WITH YOUR REGULAR TOOTHPASTE  Please do not use if you have  an allergy to CHG or antibacterial soaps. If your skin becomes reddened/irritated stop using the CHG.  Do not shave (including legs and underarms) for at least 48 hours prior to first CHG shower. It is OK to shave your face.  Please follow these instructions carefully.   1. Shower the NIGHT BEFORE SURGERY and the MORNING OF SURGERY with CHG Soap.   2. If you chose to wash your hair, wash your hair first as usual with your normal shampoo.  3. After you shampoo, rinse your hair and body thoroughly to remove the shampoo.  4. Use CHG as you would any other liquid soap. You can apply CHG directly to the skin and wash gently with a scrungie or a clean washcloth.   5. Apply the CHG Soap to your body ONLY FROM THE NECK DOWN.  Do not use on open wounds or  open sores. Avoid contact with your eyes, ears, mouth and genitals (private parts). Wash Face and genitals (private parts)  with your normal soap.   6. Wash thoroughly, paying special attention to the area where your surgery will be performed.  7. Thoroughly rinse your body with warm water from the neck down.  8. DO NOT shower/wash with your normal soap after using and rinsing off the CHG Soap.  9. Pat yourself dry with a CLEAN TOWEL.  10. Wear CLEAN PAJAMAS to bed the night before surgery  11. Place CLEAN SHEETS on your bed the night of your first shower and DO NOT SLEEP WITH PETS.   Day of Surgery: Wear Clean/Comfortable clothing the morning of surgery Do not apply any deodorants/lotions.   Remember to brush your teeth WITH YOUR REGULAR TOOTHPASTE.   Please read over the following fact sheets that you were given.

## 2020-08-02 ENCOUNTER — Ambulatory Visit (HOSPITAL_COMMUNITY)
Admission: RE | Admit: 2020-08-02 | Discharge: 2020-08-02 | Disposition: A | Discharge status: Home / Self Care | Payer: Medicare Other | Source: Ambulatory Visit | Attending: Thoracic Surgery (Cardiothoracic Vascular Surgery) | Admitting: Thoracic Surgery (Cardiothoracic Vascular Surgery)

## 2020-08-02 ENCOUNTER — Institutional Professional Consult (permissible substitution): Payer: Medicare Other | Admitting: Pulmonary Disease

## 2020-08-02 ENCOUNTER — Other Ambulatory Visit: Payer: Self-pay

## 2020-08-02 ENCOUNTER — Other Ambulatory Visit (HOSPITAL_COMMUNITY): Payer: Medicare Other

## 2020-08-02 ENCOUNTER — Encounter (HOSPITAL_COMMUNITY): Payer: Self-pay

## 2020-08-02 ENCOUNTER — Encounter (HOSPITAL_COMMUNITY)
Admission: RE | Admit: 2020-08-02 | Discharge: 2020-08-02 | Disposition: A | Discharge status: Home / Self Care | Payer: Medicare Other | Source: Ambulatory Visit | Attending: Thoracic Surgery (Cardiothoracic Vascular Surgery) | Admitting: Thoracic Surgery (Cardiothoracic Vascular Surgery)

## 2020-08-02 DIAGNOSIS — Z01818 Encounter for other preprocedural examination: Secondary | ICD-10-CM | POA: Diagnosis not present

## 2020-08-02 DIAGNOSIS — Z20822 Contact with and (suspected) exposure to covid-19: Secondary | ICD-10-CM | POA: Diagnosis not present

## 2020-08-02 DIAGNOSIS — J90 Pleural effusion, not elsewhere classified: Secondary | ICD-10-CM

## 2020-08-02 DIAGNOSIS — I517 Cardiomegaly: Secondary | ICD-10-CM | POA: Diagnosis not present

## 2020-08-02 HISTORY — DX: Dyspnea, unspecified: R06.00

## 2020-08-02 HISTORY — DX: Cardiac arrhythmia, unspecified: I49.9

## 2020-08-02 LAB — COMPREHENSIVE METABOLIC PANEL
ALT: 28 U/L (ref 0–44)
AST: 29 U/L (ref 15–41)
Albumin: 2.8 g/dL — ABNORMAL LOW (ref 3.5–5.0)
Alkaline Phosphatase: 84 U/L (ref 38–126)
Anion gap: 8 (ref 5–15)
BUN: 16 mg/dL (ref 8–23)
CO2: 31 mmol/L (ref 22–32)
Calcium: 9 mg/dL (ref 8.9–10.3)
Chloride: 99 mmol/L (ref 98–111)
Creatinine, Ser: 1.33 mg/dL — ABNORMAL HIGH (ref 0.61–1.24)
GFR calc Af Amer: 60 mL/min — ABNORMAL LOW (ref >60)
GFR calc non Af Amer: 52 mL/min — ABNORMAL LOW (ref >60)
Glucose, Bld: 121 mg/dL — ABNORMAL HIGH (ref 70–99)
Potassium: 3.9 mmol/L (ref 3.5–5.1)
Sodium: 138 mmol/L (ref 135–145)
Total Bilirubin: 0.9 mg/dL (ref 0.3–1.2)
Total Protein: 6.8 g/dL (ref 6.5–8.1)

## 2020-08-02 LAB — SURGICAL PCR SCREEN
MRSA, PCR: NEGATIVE
Staphylococcus aureus: NEGATIVE

## 2020-08-02 LAB — SARS CORONAVIRUS 2 (TAT 6-24 HRS): SARS Coronavirus 2: NEGATIVE

## 2020-08-02 LAB — CBC
HCT: 37.2 % — ABNORMAL LOW (ref 39.0–52.0)
Hemoglobin: 11.4 g/dL — ABNORMAL LOW (ref 13.0–17.0)
MCH: 28.1 pg (ref 26.0–34.0)
MCHC: 30.6 g/dL (ref 30.0–36.0)
MCV: 91.9 fL (ref 80.0–100.0)
Platelets: 256 10*3/uL (ref 150–400)
RBC: 4.05 MIL/uL — ABNORMAL LOW (ref 4.22–5.81)
RDW: 13 % (ref 11.5–15.5)
WBC: 4.6 10*3/uL (ref 4.0–10.5)
nRBC: 0 % (ref 0.0–0.2)

## 2020-08-02 LAB — APTT: aPTT: 31 seconds (ref 24–36)

## 2020-08-02 LAB — PROTIME-INR
INR: 1.2 (ref 0.8–1.2)
Prothrombin Time: 14.6 seconds (ref 11.4–15.2)

## 2020-08-02 NOTE — Progress Notes (Signed)
PCP - [name], MD Cardiologist - Glenetta Hew, MD Oncologist- Curt Bears, MD  PPM/ICD - Denies  Chest x-ray - 08/02/20 EKG - 07/08/20 Stress Test - 12/20/12 ECHO - 02/08/20 Cardiac Cath - 03/05/12  Sleep Study - Denies  Patient states he is not diabetic.  Blood Thinner Instructions: Per patient's wife, last dose Eliquis 08/03/20 Aspirin Instructions: N/A  ERAS Protcol - N/A PRE-SURGERY Ensure or G2- N/A  COVID TEST- 08/02/20 @ PAT appointment   Anesthesia review: Yes, cardiac hx.  Patient denies shortness of breath, fever, cough and chest pain at PAT appointment   All instructions explained to the patient, with a verbal understanding of the material. Patient agrees to go over the instructions while at home for a better understanding. Patient also instructed to self quarantine after being tested for COVID-19. The opportunity to ask questions was provided.

## 2020-08-02 NOTE — Pre-Procedure Instructions (Signed)
Your procedure is scheduled on Monday September 13, from 1:49 PM- 2:38 PM.  Report to [facility] Main Entrance "A" at 1145 A.M., and check in at the Admitting office.  Call this number if you have problems the morning of surgery:  [phone]   Remember:  Do not eat or drink after midnight the night before your surgery.     Take these medicines the morning of surgery with A SIP OF WATER:  amiodarone (PACERONE)   ezetimibe (ZETIA)  metoprolol tartrate (LOPRESSOR)   rosuvastatin (CRESTOR)   tamsulosin (FLOMAX)    IF NEEDED: acetaminophen (TYLENOL)      nitroGLYCERIN     hydroxypropyl methylcellulose / hypromellose (ISOPTO TEARS / GONIOVISC) eye drops  *Follow your surgeon's instructions on when to stop ELIQUIS.  If no instructions were given by your surgeon then you will need to call the office to get those instructions.      As of today, STOP taking any Aspirin (unless otherwise instructed by your surgeon) Aleve, Naproxen, Ibuprofen, Motrin, Advil, Goody's, BC's, all herbal medications, fish oil, and all vitamins.        The Morning of Surgery:              Do not wear jewelry.            Do not wear lotions, powders, colognes, or deodorant.            Men may shave face and neck.            Do not bring valuables to the hospital.            [facility] is not responsible for any belongings or valuables.  Do NOT Smoke (Tobacco/Vaping) or drink Alcohol 24 hours prior to your procedure.  If you use a CPAP at night, you may bring all equipment for your overnight stay.   Contacts, glasses, dentures or bridgework may not be worn into surgery.      For patients admitted to the hospital, discharge time will be determined by your treatment team.   Patients discharged the day of surgery will not be allowed to drive home, and someone needs to stay with them for 24 hours.    Special instructions:   [facility]- Preparing For Surgery  Before surgery, you can play an important  role. Because skin is not sterile, your skin needs to be as free of germs as possible. You can reduce the number of germs on your skin by washing with CHG (chlorahexidine gluconate) Soap before surgery.  CHG is an antiseptic cleaner which kills germs and bonds with the skin to continue killing germs even after washing.    Oral Hygiene is also important to reduce your risk of infection.  Remember - BRUSH YOUR TEETH THE MORNING OF SURGERY WITH YOUR REGULAR TOOTHPASTE  Please do not use if you have an allergy to CHG or antibacterial soaps. If your skin becomes reddened/irritated stop using the CHG.  Do not shave (including legs and underarms) for at least 48 hours prior to first CHG shower. It is OK to shave your face.  Please follow these instructions carefully.   1. Shower the NIGHT BEFORE SURGERY and the MORNING OF SURGERY with CHG Soap.   2. If you chose to wash your hair, wash your hair first as usual with your normal shampoo.  3. After you shampoo, rinse your hair and body thoroughly to remove the shampoo.  4. Use CHG as you would any  other liquid soap. You can apply CHG directly to the skin and wash gently with a scrungie or a clean washcloth.   5. Apply the CHG Soap to your body ONLY FROM THE NECK DOWN.  Do not use on open wounds or open sores. Avoid contact with your eyes, ears, mouth and genitals (private parts). Wash Face and genitals (private parts)  with your normal soap.   6. Wash thoroughly, paying special attention to the area where your surgery will be performed.  7. Thoroughly rinse your body with warm water from the neck down.  8. DO NOT shower/wash with your normal soap after using and rinsing off the CHG Soap.  9. Pat yourself dry with a CLEAN TOWEL.  10. Wear CLEAN PAJAMAS to bed the night before surgery  11. Place CLEAN SHEETS on your bed the night of your first shower and DO NOT SLEEP WITH PETS.   Day of Surgery: Shower Wear Clean/Comfortable clothing the  morning of surgery Do not apply any deodorants/lotions.   Remember to brush your teeth WITH YOUR REGULAR TOOTHPASTE.   Please read over the following fact sheets that you were given.

## 2020-08-02 NOTE — Progress Notes (Signed)
Anesthesia Chart Review:   Case: [id number] Date/Time: 08/05/20 1334   Procedure: INSERTION PLEURAL DRAINAGE CATHETER (Left )   Anesthesia type: Monitor Anesthesia Care   Pre-op diagnosis: malignant left pleural effusion   Location: [facility] OR ROOM 10 / [facility]   Surgeons: Melrose Nakayama, MD      DISCUSSION:  Pt is 74 years old with hx CAD (s/p PCI to OM leading to dissection of OM & CX-extensive PCI to both), PAF, carotid artery disease (s/p L CEA -> now CTO L ICA), HTN, CKD (stage 3), prostate cancer, new dx stage IV lung cancer (August 2021)  - Pt will hold eliquis for 48 hours prior to procedure   VS: BP 103/72   Pulse 82   Temp 36.8 C   Resp 20   Ht 6\' 3"  (1.905 m)   Wt 92.3 kg   SpO2 100%   BMI 25.44 kg/m    PROVIDERS: - PCP is [name], MD - Cardiologist is Glenetta Hew, MD. Last office visit 07/08/20 - Oncologist is Lorna Few, MD   LABS: Labs reviewed: Acceptable for surgery. (all labs ordered are listed, but only abnormal results are displayed)  Labs Reviewed  CBC - Abnormal; Notable for the following components:      Result Value   RBC 4.05 (*)    Hemoglobin 11.4 (*)    HCT 37.2 (*)    All other components within normal limits  COMPREHENSIVE METABOLIC PANEL - Abnormal; Notable for the following components:   Glucose, Bld 121 (*)    Creatinine, Ser 1.33 (*)    Albumin 2.8 (*)    GFR calc non Af Amer 52 (*)    GFR calc Af Amer 60 (*)    All other components within normal limits  SARS CORONAVIRUS 2 (TAT 6-24 HRS)  SURGICAL PCR SCREEN  APTT  PROTIME-INR     IMAGES: CXR 08/02/20:  1. Stable prominent left pleural effusion. Stable left pleural thickening. 2.  Stable left suprahilar prominence. 3.  Stable cardiomegaly.   EKG 07/08/20: sinus bradycardia   CV: Carotid duplex 07/15/20:  - Right Carotid: Velocities in the right ICA are consistent with a 1-39%  stenosis.  - Left Carotid: Evidence consistent with a total occlusion of the  left ICA. Non-hemodynamically significant plaque <50% noted in the CCA.  - Vertebrals: Bilateral vertebral arteries demonstrate antegrade flow.  - Subclavians: Normal flow hemodynamics were seen in bilateral subclavian arteries.   Echo 02/08/20:  1. Left ventricular ejection fraction, by estimation, is 55 to 60%. The left ventricle has normal function. The left ventricle has no regional wall motion abnormalities. There is moderate concentric left ventricular hypertrophy. Left ventricular diastolic parameters are consistent with Grade II diastolic dysfunction (pseudonormalization).  2. Right ventricular systolic function is normal. The right ventricular size is normal. There is mildly elevated pulmonary artery systolic pressure.  3. Left atrial size was severely dilated.  4. The mitral valve is normal in structure. Trivial mitral valve regurgitation. No evidence of mitral stenosis.  5. Aortic valve leaflets are severely calcified and restricted. By peak  velocity and mean gradient, aortic stenosis is mild. It appears visually  to at least be moderate. The aortic valve is normal in structure. Aortic  valve regurgitation is not  visualized. No aortic stenosis is present.  6. Aortic dilatation noted. There is mild dilatation of the ascending aorta measuring 40 mm.  7. The inferior vena cava is normal in size with <50% respiratory variability, suggesting right  atrial pressure of 8 mmHg.     Past Medical History:  Diagnosis Date  . Arthritis    back  . BPH (benign prostatic hypertrophy)   . CAD S/P multivessel PCI: LAD, RCA and extensive LCX-OM1 03/15/2012   s/p PCI to LAD, to RCA (now occluded), then extensive PCI to Dominant LCx-OM1  (enitre prox-AVG Circ for dissection in 07/2011)  . Cancer ([place])    SKIN CA  . Dyspnea   . Dysrhythmia    afib  . GERD (gastroesophageal reflux disease)   . Heart murmur   . History of colon polyps   . History of echocardiogram 08/16/2011   Echo - EF 60-65%;  moderate LV concentric hypertrophy; abdnormal LV relaxation (grade 1 diastolic dysfunction; ascending aorta mildly dilated; LA moderately dilated;   . Hyperlipidemia   . Hypertension   . 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 PAF (paroxysmal atrial fibrillation) ([place]) 12/20/2012   followed by Dr. Glenetta Hew; s/p DCCV 2011, 07/2011 post PCI with Type 4a MI; CHA2DS2Vasc = 3, on Warfarin  . Paroxysmal atrial flutter ([place]) 08/20/2011   TEE- atrial septum - no defect identified; RA normal in size, no evidence of thrombus; ascending aorta normal  . Prostate cancer ([place])   . Rupture quadriceps tendon     Past Surgical History:  Procedure Laterality Date  . BACK SURGERY    . COLONOSCOPY    . CORONARY ANGIOPLASTY WITH STENT PLACEMENT  03/15/2012   multiple stens in AVGroove Circ & OM1;; 2 site PTCA w/ PTCA and stenting of OM2 ang PTCA of distal stent ISR followed by PTCA of mid LCx ISR  . CORONARY STENT PLACEMENT  2003 - 2011 - 2012 - 2013   Pre-2012 - BMS in RCA now known occluded, BMS in proximal LAD; 07/2011 - 2.25 x 23 BMS in OM 1 with significant disease on either side noted shortly after -->  2 additional overlapping DES 2.5 mm x 30 mm and 2.25 mm x 26 mm in OM1,; 3 overlapping Resolute DES in AV groove Cx crossing OM1: (Tapered from 3.8-2.6 mm)- Resolute DES 3.5 x 22, 3.0 x 38, 2.5 x 14  . DOPPLER ECHOCARDIOGRAPHY  07/2011   EF ~60-65%, Grade 1 D Dysfxn; mod Conc LVH  . ENDARTERECTOMY Left 02/27/2016   Procedure: LEFT  CAROTID ARTERY ENDARTERECTOMY ;  Surgeon: Serafina Mitchell, MD;  Location: [facility];  Service: Vascular;  Laterality: Left;  . LEFT HEART CATHETERIZATION WITH CORONARY ANGIOGRAM N/A 03/15/2012   Procedure: LEFT HEART CATHETERIZATION WITH CORONARY ANGIOGRAM;  Surgeon: [name], MD;  Location: [facility] CATH LAB: patent LAD stents, patent LCx stents w/80% focal ISR just distal to OM; OM proximal stent  open; -- 2 site PTCA-PCI  . LUMBAR LAMINECTOMY/DECOMPRESSION MICRODISCECTOMY Bilateral 12/11/2014   Procedure: Bilateral Lumbar Three-Four Laminectomy;  Surgeon: Kristeen Miss, MD;  Location: [facility] NEURO ORS;  Service: Neurosurgery;  Laterality: Bilateral;  bilateral  . NM MYOVIEW LTD  11/2012   No ischemia or infarction, normal EF & WM  . PATCH ANGIOPLASTY Left 02/27/2016   Procedure: WITH HEMASHIELD DACRON  PATCH ANGIOPLASTY;  Surgeon: Serafina Mitchell, MD;  Location: [facility];  Service: Vascular;  Laterality: Left;  [name] QUADRICEPS TENDON REPAIR  09/19/2012   Procedure: REPAIR QUADRICEP TENDON;  Surgeon: [name], MD;  Location: [facility]S;  Service: Orthopedics;  Laterality: Left;  [name] VASECTOMY      MEDICATIONS: .  acetaminophen (TYLENOL) 325 MG tablet  . amiodarone (PACERONE) 200 MG tablet  . Coenzyme Q10 200 MG capsule  . ELIQUIS 5 MG TABS tablet  . ezetimibe (ZETIA) 10 MG tablet  . finasteride (PROSCAR) 5 MG tablet  . hydroxypropyl methylcellulose / hypromellose (ISOPTO TEARS / GONIOVISC) 2.5 % ophthalmic solution  . Leuprolide Acetate (ELIGARD [place])  . metoprolol tartrate (LOPRESSOR) 25 MG tablet  . Multiple Vitamin (MULTIVITAMIN WITH MINERALS) TABS tablet  . nitroGLYCERIN (NITROSTAT) 0.4 MG SL tablet  . olmesartan (BENICAR) 20 MG tablet  . Omega-3 Fatty Acids (FISH OIL) 1000 MG CAPS  . rosuvastatin (CRESTOR) 40 MG tablet  . tamsulosin (FLOMAX) 0.4 MG CAPS capsule  . VENTOLIN HFA 108 (90 Base) MCG/ACT inhaler   No current facility-administered medications for this encounter.   - Pt will hold eliquis for 48 hours prior to procedure   If no changes, I anticipate pt can proceed with surgery as scheduled.   Willeen Cass, PhD, FNP-BC [facility]/Anesthesiology Phone: [phone] 08/02/2020 2:49 PM

## 2020-08-02 NOTE — Anesthesia Preprocedure Evaluation (Addendum)
Anesthesia Evaluation  Patient identified by MRN, date of birth, ID band Patient awake    Reviewed: Allergy & Precautions, NPO status , Patient's Chart, lab work & pertinent test results, reviewed documented beta blocker date and time   History of Anesthesia Complications Negative for: history of anesthetic complications  Airway Mallampati: II  TM Distance: >3 FB Neck ROM: Full    Dental  (+) Dental Advisory Given   Pulmonary shortness of breath,  08/02/2020 SARS coronavirus NEG  new dx stage IV lung cancer: large L pleural effusion   breath sounds clear to auscultation       Cardiovascular hypertension, Pt. on medications and Pt. on home beta blockers (-) angina+ CAD and + Peripheral Vascular Disease  + dysrhythmias Atrial Fibrillation + Valvular Problems/Murmurs (mild) AS  Rhythm:Regular Rate:Normal  01/2020 ECHO: normal LVF, EF 55-60%, mild AS with AV Peak Grad: 26.6 mmHg, AV Mean Grad: 15.0 mmHg   Neuro/Psych TIA   GI/Hepatic Neg liver ROS, GERD  Controlled,  Endo/Other  negative endocrine ROS  Renal/GU Renal InsufficiencyRenal disease (creat 1.33)   Prostate cancer    Musculoskeletal  (+) Arthritis ,   Abdominal   Peds  Hematology eliquis   Anesthesia Other Findings   Reproductive/Obstetrics                          Anesthesia Physical Anesthesia Plan  ASA: IV  Anesthesia Plan: MAC   Post-op Pain Management:    Induction:   PONV Risk Score and Plan: 1 and Treatment may vary due to age or medical condition  Airway Management Planned: Natural Airway and Simple Face Mask  Additional Equipment: None  Intra-op Plan:   Post-operative Plan:   Informed Consent: I have reviewed the patients History and Physical, chart, labs and discussed the procedure including the risks, benefits and alternatives for the proposed anesthesia with the patient or authorized representative who has  indicated his/her understanding and acceptance.     Dental advisory given  Plan Discussed with: CRNA and Surgeon  Anesthesia Plan Comments: (See APP note by Durel Salts, FNP)      Anesthesia Quick Evaluation

## 2020-08-05 ENCOUNTER — Encounter (HOSPITAL_COMMUNITY): Payer: Self-pay | Admitting: Thoracic Surgery (Cardiothoracic Vascular Surgery)

## 2020-08-05 ENCOUNTER — Other Ambulatory Visit: Payer: Self-pay

## 2020-08-05 ENCOUNTER — Encounter: Payer: Self-pay | Admitting: Internal Medicine

## 2020-08-05 ENCOUNTER — Ambulatory Visit (HOSPITAL_COMMUNITY): Payer: Medicare Other

## 2020-08-05 ENCOUNTER — Encounter (HOSPITAL_COMMUNITY)
Admission: RE | Disposition: A | Discharge status: Home / Self Care | Payer: Self-pay | Source: Home / Self Care | Attending: Thoracic Surgery (Cardiothoracic Vascular Surgery)

## 2020-08-05 ENCOUNTER — Ambulatory Visit (HOSPITAL_COMMUNITY): Payer: Medicare Other | Admitting: Certified Registered Nurse Anesthetist

## 2020-08-05 ENCOUNTER — Telehealth: Payer: Self-pay

## 2020-08-05 ENCOUNTER — Ambulatory Visit (HOSPITAL_COMMUNITY)
Admission: RE | Admit: 2020-08-05 | Discharge: 2020-08-05 | Disposition: A | Discharge status: Home / Self Care | Payer: Medicare Other | Attending: Thoracic Surgery (Cardiothoracic Vascular Surgery) | Admitting: Thoracic Surgery (Cardiothoracic Vascular Surgery)

## 2020-08-05 DIAGNOSIS — J910 Malignant pleural effusion: Secondary | ICD-10-CM | POA: Insufficient documentation

## 2020-08-05 DIAGNOSIS — Z7901 Long term (current) use of anticoagulants: Secondary | ICD-10-CM | POA: Diagnosis not present

## 2020-08-05 DIAGNOSIS — Z8546 Personal history of malignant neoplasm of prostate: Secondary | ICD-10-CM | POA: Diagnosis not present

## 2020-08-05 DIAGNOSIS — M1990 Unspecified osteoarthritis, unspecified site: Secondary | ICD-10-CM | POA: Insufficient documentation

## 2020-08-05 DIAGNOSIS — C3490 Malignant neoplasm of unspecified part of unspecified bronchus or lung: Secondary | ICD-10-CM | POA: Insufficient documentation

## 2020-08-05 DIAGNOSIS — Z8673 Personal history of transient ischemic attack (TIA), and cerebral infarction without residual deficits: Secondary | ICD-10-CM | POA: Diagnosis not present

## 2020-08-05 DIAGNOSIS — I2510 Atherosclerotic heart disease of native coronary artery without angina pectoris: Secondary | ICD-10-CM | POA: Diagnosis not present

## 2020-08-05 DIAGNOSIS — I480 Paroxysmal atrial fibrillation: Secondary | ICD-10-CM | POA: Insufficient documentation

## 2020-08-05 DIAGNOSIS — I10 Essential (primary) hypertension: Secondary | ICD-10-CM | POA: Diagnosis not present

## 2020-08-05 DIAGNOSIS — J90 Pleural effusion, not elsewhere classified: Secondary | ICD-10-CM

## 2020-08-05 DIAGNOSIS — Z955 Presence of coronary angioplasty implant and graft: Secondary | ICD-10-CM | POA: Diagnosis not present

## 2020-08-05 DIAGNOSIS — Z79899 Other long term (current) drug therapy: Secondary | ICD-10-CM | POA: Insufficient documentation

## 2020-08-05 HISTORY — PX: CHEST TUBE INSERTION: SHX231

## 2020-08-05 LAB — GUARDANT 360

## 2020-08-05 SURGERY — INSERTION, PLEURAL DRAINAGE CATHETER
Anesthesia: Monitor Anesthesia Care | Site: Chest | Laterality: Left

## 2020-08-05 MED ORDER — MEPERIDINE HCL 25 MG/ML IJ SOLN: 6.2500 mg | INTRAMUSCULAR | Status: DC | PRN

## 2020-08-05 MED ORDER — EZETIMIBE 10 MG PO TABS
10.0000 mg | ORAL_TABLET | Freq: Every day | ORAL | 2 refills | Status: DC
Start: 2020-08-05 — End: 2020-11-22

## 2020-08-05 MED ORDER — LIDOCAINE HCL (PF) 1 % IJ SOLN
INTRAMUSCULAR | Status: DC | PRN
  Administered 2020-08-05: 15 mL

## 2020-08-05 MED ORDER — CHLORHEXIDINE GLUCONATE 0.12 % MT SOLN
15.0000 mL | Freq: Once | OROMUCOSAL | Status: AC
  Administered 2020-08-05: 15 mL via OROMUCOSAL
  Filled 2020-08-05: qty 15

## 2020-08-05 MED ORDER — TRAMADOL HCL 50 MG PO TABS: 50.0000 mg | ORAL_TABLET | Freq: Four times a day (QID) | ORAL | Status: DC | PRN

## 2020-08-05 MED ORDER — PROPOFOL 500 MG/50ML IV EMUL
INTRAVENOUS | Status: DC | PRN
  Administered 2020-08-05: 75 ug/kg/min via INTRAVENOUS

## 2020-08-05 MED ORDER — ORAL CARE MOUTH RINSE: 15.0000 mL | Freq: Once | OROMUCOSAL | Status: AC

## 2020-08-05 MED ORDER — CEFAZOLIN SODIUM-DEXTROSE 2-4 GM/100ML-% IV SOLN
2.0000 g | INTRAVENOUS | Status: AC
  Administered 2020-08-05: 2 g via INTRAVENOUS
  Filled 2020-08-05: qty 100

## 2020-08-05 MED ORDER — FENTANYL CITRATE (PF) 100 MCG/2ML IJ SOLN: 25.0000 ug | INTRAMUSCULAR | Status: DC | PRN

## 2020-08-05 MED ORDER — 0.9 % SODIUM CHLORIDE (POUR BTL) OPTIME
TOPICAL | Status: DC | PRN
  Administered 2020-08-05: 1000 mL

## 2020-08-05 MED ORDER — LACTATED RINGERS IV SOLN: INTRAVENOUS | Status: DC

## 2020-08-05 MED ORDER — TRAMADOL HCL 50 MG PO TABS: 50.0000 mg | ORAL_TABLET | Freq: Four times a day (QID) | ORAL | 0 refills | Status: DC | PRN

## 2020-08-05 MED ORDER — OXYCODONE HCL 5 MG/5ML PO SOLN: 5.0000 mg | Freq: Once | ORAL | Status: DC | PRN

## 2020-08-05 MED ORDER — DEXMEDETOMIDINE (PRECEDEX) IN NS 20 MCG/5ML (4 MCG/ML) IV SYRINGE
PREFILLED_SYRINGE | INTRAVENOUS | Status: DC | PRN
  Administered 2020-08-05: 8 ug via INTRAVENOUS

## 2020-08-05 MED ORDER — FISH OIL 1000 MG PO CAPS: 1.0000 | ORAL_CAPSULE | Freq: Two times a day (BID) | ORAL | 0 refills | Status: AC

## 2020-08-05 MED ORDER — FENTANYL CITRATE (PF) 250 MCG/5ML IJ SOLN
INTRAMUSCULAR | Status: DC | PRN
  Administered 2020-08-05 (×2): 25 ug via INTRAVENOUS

## 2020-08-05 MED ORDER — OXYCODONE HCL 5 MG PO TABS: 5.0000 mg | ORAL_TABLET | Freq: Once | ORAL | Status: DC | PRN

## 2020-08-05 MED ORDER — AMIODARONE HCL 200 MG PO TABS: 200.0000 mg | ORAL_TABLET | Freq: Every day | ORAL | Status: DC

## 2020-08-05 MED ORDER — FENTANYL CITRATE (PF) 250 MCG/5ML IJ SOLN
INTRAMUSCULAR | Status: AC
  Filled 2020-08-05: qty 5

## 2020-08-05 MED ORDER — LIDOCAINE HCL (PF) 1 % IJ SOLN
INTRAMUSCULAR | Status: AC
  Filled 2020-08-05: qty 30

## 2020-08-05 MED ORDER — MIDAZOLAM HCL 2 MG/2ML IJ SOLN: 0.5000 mg | Freq: Once | INTRAMUSCULAR | Status: DC | PRN

## 2020-08-05 MED ORDER — PROMETHAZINE HCL 25 MG/ML IJ SOLN: 6.2500 mg | INTRAMUSCULAR | Status: DC | PRN

## 2020-08-05 SURGICAL SUPPLY — 32 items
ADH SKN CLS APL DERMABOND .7 (GAUZE/BANDAGES/DRESSINGS) ×1
BLADE CLIPPER SURG (BLADE) ×5 IMPLANT
CANISTER SUCT 3000ML PPV (MISCELLANEOUS) ×3 IMPLANT
COVER SURGICAL LIGHT HANDLE (MISCELLANEOUS) ×3 IMPLANT
DERMABOND ADVANCED (GAUZE/BANDAGES/DRESSINGS) ×2
DERMABOND ADVANCED .7 DNX12 (GAUZE/BANDAGES/DRESSINGS) ×1 IMPLANT
DRAPE C-ARM 42X72 X-RAY (DRAPES) ×3 IMPLANT
DRAPE LAPAROSCOPIC ABDOMINAL (DRAPES) ×3 IMPLANT
DRSG COVADERM 4X6 (GAUZE/BANDAGES/DRESSINGS) ×2 IMPLANT
GAUZE SPONGE 4X4 12PLY STRL (GAUZE/BANDAGES/DRESSINGS) ×2 IMPLANT
GLOVE BIOGEL PI IND STRL 6.5 (GLOVE) IMPLANT
GLOVE BIOGEL PI INDICATOR 6.5 (GLOVE) ×4
GLOVE SURG SIGNA 7.5 PF LTX (GLOVE) ×3 IMPLANT
GOWN STRL REUS W/ TWL LRG LVL3 (GOWN DISPOSABLE) ×1 IMPLANT
GOWN STRL REUS W/ TWL XL LVL3 (GOWN DISPOSABLE) ×1 IMPLANT
GOWN STRL REUS W/TWL LRG LVL3 (GOWN DISPOSABLE) ×6
GOWN STRL REUS W/TWL XL LVL3 (GOWN DISPOSABLE) ×3
KIT BASIN OR (CUSTOM PROCEDURE TRAY) ×3 IMPLANT
KIT PLEURX DRAIN CATH 1000ML (MISCELLANEOUS) ×5 IMPLANT
KIT PLEURX DRAIN CATH 15.5FR (DRAIN) ×3 IMPLANT
KIT TURNOVER KIT B (KITS) ×3 IMPLANT
NS IRRIG 1000ML POUR BTL (IV SOLUTION) ×3 IMPLANT
PACK GENERAL/GYN (CUSTOM PROCEDURE TRAY) ×3 IMPLANT
PAD ARMBOARD 7.5X6 YLW CONV (MISCELLANEOUS) ×6 IMPLANT
SET DRAINAGE LINE (MISCELLANEOUS) IMPLANT
SUT ETHILON 3 0 FSL (SUTURE) ×3 IMPLANT
SUT VIC AB 3-0 X1 27 (SUTURE) ×3 IMPLANT
SYR CONTROL 10ML LL (SYRINGE) ×3 IMPLANT
TOWEL GREEN STERILE (TOWEL DISPOSABLE) ×3 IMPLANT
TOWEL GREEN STERILE FF (TOWEL DISPOSABLE) ×3 IMPLANT
VALVE REPLACEMENT CAP (MISCELLANEOUS) IMPLANT
WATER STERILE IRR 1000ML POUR (IV SOLUTION) ×3 IMPLANT

## 2020-08-05 NOTE — Op Note (Signed)
NAME: Charles Robinson, GORR MEDICAL RECORD SJ:[id number] ACCOUNT [id number] DATE OF BIRTH:August 18, 1946 FACILITY: [facility] LOCATION: [facility] PHYSICIAN:Hurley Blevins Chaya Jan, MD  OPERATIVE REPORT  DATE OF PROCEDURE:  08/05/2020  PREOPERATIVE DIAGNOSIS:  Malignant left pleural effusion.  POSTOPERATIVE DIAGNOSIS:  Malignant left pleural effusion.  PROCEDURE:  Insertion of left pleural drainage catheter.  SURGEON:  Modesto Charon, MD  ASSISTANT:  None.  ANESTHESIA:  Local with intravenous sedation.  FINDINGS:  Catheter placed in pleural space.  Two L of amber fluid evacuated.  CLINICAL NOTE:  [name] is a 74 year old man with a malignant left pleural effusion.  He had symptomatic improvement after a thoracentesis, but had rapid reaccumulation.  He was advised to undergo pleural catheter placement for palliation.  The  indications, risks, benefits, and alternatives were discussed in detail with the patient.  He understood and accepted the risks and agreed to proceed.  DESCRIPTION OF PROCEDURE:  [name] was brought to the operating room on 08/05/2020.  He was given intravenous sedation and monitored by anesthesia.  Intravenous antibiotics were administered.  The left chest was prepped and draped in the usual  sterile fashion.  A timeout was performed.  The sites for the insertion and the exit site and the tract between the two were anesthetized with 1% lidocaine.  A total of 10 mL was used in all.  After ensuring adequate local anesthetic effect, incisions were made at both sites.   The left pleural space was accessed using a modified Seldinger technique.  A wire passed easily.  Fluoroscopy confirmed position of the wire within the chest.  The catheter then was tunneled from the exit site to the insertion incision.  The cuff was  left just under the skin at the exit site.  The tract over the wire was dilated and Peel-Away sheath introducer was placed.  The catheter was inserted through the  Peel-Away sheath introducer, which was removed.  The catheter then was hooked to the  suction canister.  Two 1 L bottles were drained.  The fluid was amber.  Fluoroscopy showed significant improvement in the pleural effusion.  The catheter was secured at the exit site with a 3-0 nylon suture.  The incision at the insertion site was closed  with a 3-0 Vicryl subcuticular suture.  Dermabond was applied.  The catheter was capped.  A dressing was applied.  The patient was taken from the operating room to the [facility] unit, extubated and in good condition.  VN/NUANCE  D:08/05/2020 T:08/05/2020 JOB:[id number]

## 2020-08-05 NOTE — Telephone Encounter (Signed)
I will advise the referring doctor.

## 2020-08-05 NOTE — Anesthesia Postprocedure Evaluation (Signed)
Anesthesia Post Note  Patient: Charles Robinson  Procedure(s) Performed: INSERTION LEFT PLEURAL DRAINAGE CATHETER (Left Chest)     Patient location during evaluation: PACU Anesthesia Type: MAC Level of consciousness: awake and alert, patient cooperative and oriented Pain management: pain level controlled Vital Signs Assessment: post-procedure vital signs reviewed and stable Respiratory status: spontaneous breathing, nonlabored ventilation and respiratory function stable Cardiovascular status: blood pressure returned to baseline and stable Postop Assessment: no apparent nausea or vomiting Anesthetic complications: no   No complications documented.  Last Vitals:  Vitals:   08/05/20 1423 08/05/20 1438  BP: (!) 157/93 (!) 181/86  Pulse: 64 65  Resp: (!) 25 17  Temp:    SpO2: 98% 100%    Last Pain:  Vitals:   08/05/20 1423  TempSrc:   PainSc: 0-No pain                 Tahani Potier,E. Kaitlan Bin

## 2020-08-05 NOTE — OR Nursing (Signed)
Dr Roxan Hockey removed 2 liters of pleural fluid from patients left chest

## 2020-08-05 NOTE — Discharge Instructions (Addendum)
Do not drive or engage in heavy physical activity for 24 hours  You may resume normal activities tomorrow  You may shower tomorrow. Leave dressing in place over the catheter while showering.  You have a prescription for tramadol (Ultram), a narcotic pain reliever. You may use as directed. You may use acetaminophen (Tylenol) in addition to, or instead of, the tramadol.   Resume Apixaban (Eliquis) tomorrow  Call [phone] if you develop chest pain, shortness of breath, fever > 101 F or notice excessive swelling, pain, redness or drainage from the incision.  My office will contact you to schedule a follow up visit.

## 2020-08-05 NOTE — Anesthesia Procedure Notes (Signed)
Procedure Name: MAC Performed by: Valda Favia, CRNA Pre-anesthesia Checklist: Patient identified, Emergency Drugs available, Suction available, Patient being monitored and Timeout performed Patient Re-evaluated:Patient Re-evaluated prior to induction Oxygen Delivery Method: Simple face mask Preoxygenation: Pre-oxygenation with 100% oxygen Placement Confirmation: positive ETCO2 Dental Injury: Teeth and Oropharynx as per pre-operative assessment

## 2020-08-05 NOTE — Transfer of Care (Signed)
Immediate Anesthesia Transfer of Care Note  Patient: Charles Robinson  Procedure(s) Performed: INSERTION LEFT PLEURAL DRAINAGE CATHETER (Left Chest)  Patient Location: PACU  Anesthesia Type:MAC  Level of Consciousness: awake, alert  and oriented  Airway & Oxygen Therapy: Patient Spontanous Breathing  Post-op Assessment: Report given to RN and Post -op Vital signs reviewed and stable  Post vital signs: Reviewed and stable  Last Vitals:  Vitals Value Taken Time  BP 134/85 08/05/20 1408  Temp    Pulse 64 08/05/20 1410  Resp 16 08/05/20 1410  SpO2 100 % 08/05/20 1410  Vitals shown include unvalidated device data.  Last Pain:  Vitals:   08/05/20 1233  TempSrc:   PainSc: 0-No pain         Complications: No complications documented.

## 2020-08-05 NOTE — Interval H&P Note (Signed)
History and Physical Interval Note:  08/05/2020 1:12 PM  Charles Robinson  has presented today for surgery, with the diagnosis of malignant left pleural effusion.  The various methods of treatment have been discussed with the patient and family. After consideration of risks, benefits and other options for treatment, the patient has consented to  Procedure(s): INSERTION PLEURAL DRAINAGE CATHETER (Left) as a surgical intervention.  The patient's history has been reviewed, patient examined, no change in status, stable for surgery.  I have reviewed the patient's chart and labs.  Questions were answered to the patient's satisfaction.     Melrose Nakayama

## 2020-08-05 NOTE — Telephone Encounter (Signed)
-----   Message from Serafina Mitchell, MD sent at 07/28/2020  9:03 PM EDT ----- Regarding: RE: MRI L carotid occlusion No, his carotid has been occluded for at least 6 months.  He can see me in a year.  I just saw him a few weeks ago ----- Message ----- From: Reola Calkins, CMA Sent: 07/28/2020   7:06 PM EDT To: Serafina Mitchell, MD Subject: MRI L carotid occlusion                        Dr.Mohamed sent  a referral -for - Carotid artery occlusion seen on MRI of the head, you seen this pt on 07/15/20 w/ a carotid duplex that showed this. Do want to bring this pt in to be seen before your next recommended f/u which is one year. Please advise.

## 2020-08-05 NOTE — Brief Op Note (Signed)
08/05/2020  2:17 PM  PATIENT:  [name]  74 y.o. male  PRE-OPERATIVE DIAGNOSIS:  malignant left pleural effusion  POST-OPERATIVE DIAGNOSIS:  malignant left pleural effusion  PROCEDURE:  Procedure(s): INSERTION LEFT PLEURAL DRAINAGE CATHETER (Left)  SURGEON:  Surgeon(s) and Role:    * Melrose Nakayama, MD - Primary  PHYSICIAN ASSISTANT:   ASSISTANTS: none   ANESTHESIA:   local and IV sedation  EBL:  2 mL   BLOOD ADMINISTERED:none  DRAINS: pleural catheter   LOCAL MEDICATIONS USED:  LIDOCAINE  and Amount: 10 ml  SPECIMEN:  Source of Specimen:  pleural fluid  DISPOSITION OF SPECIMEN:  N/A  COUNTS:  YES  TOURNIQUET:  * No tourniquets in log *  DICTATION: .Other Dictation: Dictation Number -  PLAN OF CARE: Discharge to home after PACU  PATIENT DISPOSITION:  PACU - hemodynamically stable.   Delay start of Pharmacological VTE agent (>24hrs) due to surgical blood loss or risk of bleeding: not applicable

## 2020-08-06 ENCOUNTER — Encounter (HOSPITAL_COMMUNITY): Payer: Self-pay | Admitting: Thoracic Surgery (Cardiothoracic Vascular Surgery)

## 2020-08-06 ENCOUNTER — Telehealth: Payer: Self-pay

## 2020-08-06 ENCOUNTER — Other Ambulatory Visit: Payer: Self-pay | Admitting: Thoracic Surgery (Cardiothoracic Vascular Surgery)

## 2020-08-06 MED ORDER — PROMETHAZINE HCL 12.5 MG RE SUPP: 12.5000 mg | Freq: Four times a day (QID) | RECTAL | 0 refills | Status: DC | PRN

## 2020-08-06 NOTE — Progress Notes (Signed)
Mr. Hevener is complaining of nausea and requesting a suppository. Prescription for phenergan suppository sent to [facility] on [address].  Revonda Standard Roxan Hockey, MD [facility] [phone]

## 2020-08-06 NOTE — Telephone Encounter (Signed)
-----   Message from Melrose Nakayama, MD sent at 08/06/2020  3:45 PM EDT ----- Regarding: RE: N/V x 1day Contact: [phone] I sent a script for a phenergan suppository to [facility] [address] ----- Message ----- From: Marylen Ponto, LPN Sent: 4/46/9507   3:23 PM EDT To: Melrose Nakayama, MD Subject: N/V x 1day                                     Ms Brafford is calling about her husband having nausea and vomiting since last night. He denies any fevers or diarrhea. Home health nurse drained pleurX today and got 850 cc's, she will drain again tomorrow. He is requesting a medication for nausea if you agree and would prefer a suppository. [facility] epic [facility] I did let them know to contact Dr Shaw/PCP about this issue also. Please advise SW

## 2020-08-07 ENCOUNTER — Telehealth: Payer: Self-pay | Admitting: *Deleted

## 2020-08-07 DIAGNOSIS — C3492 Malignant neoplasm of unspecified part of left bronchus or lung: Secondary | ICD-10-CM

## 2020-08-07 NOTE — Telephone Encounter (Signed)
I called patient to make sure he received my message on his appt tomorrow. I spoke with him and he did received my message. He will be here tomorrow as scheduled.

## 2020-08-07 NOTE — Telephone Encounter (Signed)
I received a message from Dr. Julien Nordmann to call and schedule patient to be seen tomorrow.  I called Charles Robinson but was unable to reach. I did leave vm message with the appt and my name and telephone number to call.

## 2020-08-08 ENCOUNTER — Inpatient Hospital Stay (HOSPITAL_BASED_OUTPATIENT_CLINIC_OR_DEPARTMENT_OTHER): Payer: Medicare Other | Admitting: Internal Medicine

## 2020-08-08 ENCOUNTER — Encounter: Payer: Self-pay | Admitting: Internal Medicine

## 2020-08-08 ENCOUNTER — Inpatient Hospital Stay: Payer: Medicare Other

## 2020-08-08 ENCOUNTER — Other Ambulatory Visit: Payer: Self-pay

## 2020-08-08 ENCOUNTER — Encounter: Payer: Self-pay | Admitting: *Deleted

## 2020-08-08 VITALS — BP 110/61 | HR 85 | Temp 98.2°F | Resp 17 | Ht 75.0 in | Wt 194.1 lb

## 2020-08-08 DIAGNOSIS — I6523 Occlusion and stenosis of bilateral carotid arteries: Secondary | ICD-10-CM

## 2020-08-08 DIAGNOSIS — R5382 Chronic fatigue, unspecified: Secondary | ICD-10-CM

## 2020-08-08 DIAGNOSIS — Z5112 Encounter for antineoplastic immunotherapy: Secondary | ICD-10-CM | POA: Diagnosis not present

## 2020-08-08 DIAGNOSIS — Z7189 Other specified counseling: Secondary | ICD-10-CM

## 2020-08-08 DIAGNOSIS — C3492 Malignant neoplasm of unspecified part of left bronchus or lung: Secondary | ICD-10-CM | POA: Diagnosis not present

## 2020-08-08 DIAGNOSIS — I10 Essential (primary) hypertension: Secondary | ICD-10-CM | POA: Diagnosis not present

## 2020-08-08 DIAGNOSIS — Z5111 Encounter for antineoplastic chemotherapy: Secondary | ICD-10-CM | POA: Diagnosis not present

## 2020-08-08 LAB — CBC WITH DIFFERENTIAL (CANCER CENTER ONLY)
Abs Immature Granulocytes: 0.02 10*3/uL (ref 0.00–0.07)
Basophils Absolute: 0 10*3/uL (ref 0.0–0.1)
Basophils Relative: 0 %
Eosinophils Absolute: 0.1 10*3/uL (ref 0.0–0.5)
Eosinophils Relative: 1 %
HCT: 36.9 % — ABNORMAL LOW (ref 39.0–52.0)
Hemoglobin: 11.6 g/dL — ABNORMAL LOW (ref 13.0–17.0)
Immature Granulocytes: 0 %
Lymphocytes Relative: 6 %
Lymphs Abs: 0.3 10*3/uL — ABNORMAL LOW (ref 0.7–4.0)
MCH: 28.6 pg (ref 26.0–34.0)
MCHC: 31.4 g/dL (ref 30.0–36.0)
MCV: 90.9 fL (ref 80.0–100.0)
Monocytes Absolute: 0.6 10*3/uL (ref 0.1–1.0)
Monocytes Relative: 11 %
Neutro Abs: 4.2 10*3/uL (ref 1.7–7.7)
Neutrophils Relative %: 82 %
Platelet Count: 176 10*3/uL (ref 150–400)
RBC: 4.06 MIL/uL — ABNORMAL LOW (ref 4.22–5.81)
RDW: 13.5 % (ref 11.5–15.5)
WBC Count: 5.2 10*3/uL (ref 4.0–10.5)
nRBC: 0 % (ref 0.0–0.2)

## 2020-08-08 LAB — CMP (CANCER CENTER ONLY)
ALT: 15 U/L (ref 0–44)
AST: 20 U/L (ref 15–41)
Albumin: 2.5 g/dL — ABNORMAL LOW (ref 3.5–5.0)
Alkaline Phosphatase: 95 U/L (ref 38–126)
Anion gap: 6 (ref 5–15)
BUN: 22 mg/dL (ref 8–23)
CO2: 32 mmol/L (ref 22–32)
Calcium: 8.8 mg/dL — ABNORMAL LOW (ref 8.9–10.3)
Chloride: 100 mmol/L (ref 98–111)
Creatinine: 1.21 mg/dL (ref 0.61–1.24)
GFR, Est AFR Am: >60 mL/min (ref >60)
GFR, Estimated: 58 mL/min — ABNORMAL LOW (ref >60)
Glucose, Bld: 113 mg/dL — ABNORMAL HIGH (ref 70–99)
Potassium: 4.1 mmol/L (ref 3.5–5.1)
Sodium: 138 mmol/L (ref 135–145)
Total Bilirubin: 1.1 mg/dL (ref 0.3–1.2)
Total Protein: 6.2 g/dL — ABNORMAL LOW (ref 6.5–8.1)

## 2020-08-08 MED ORDER — CYANOCOBALAMIN 1000 MCG/ML IJ SOLN
INTRAMUSCULAR | Status: AC
  Filled 2020-08-08: qty 1

## 2020-08-08 MED ORDER — MIRTAZAPINE 30 MG PO TABS: 30.0000 mg | ORAL_TABLET | Freq: Every day | ORAL | 2 refills | Status: DC

## 2020-08-08 MED ORDER — PROCHLORPERAZINE MALEATE 10 MG PO TABS: 10.0000 mg | ORAL_TABLET | Freq: Four times a day (QID) | ORAL | 0 refills | Status: DC | PRN

## 2020-08-08 MED ORDER — CYANOCOBALAMIN 1000 MCG/ML IJ SOLN
1000.0000 ug | Freq: Once | INTRAMUSCULAR | Status: AC
  Administered 2020-08-08: 1000 ug via INTRAMUSCULAR

## 2020-08-08 MED ORDER — FOLIC ACID 1 MG PO TABS: 1.0000 mg | ORAL_TABLET | Freq: Every day | ORAL | 4 refills | Status: AC

## 2020-08-08 NOTE — Progress Notes (Signed)
[place] [phone]   [phone]  OFFICE PROGRESS NOTE  [name], MD [place] [address]  DIAGNOSIS: Stage IV (T3, N2, M1c) non-small cell lung cancer, adenocarcinoma presented with left upper lobe lung mass with occlusion of the left upper lobe bronchus and near complete left lower lobe atelectasis in addition to mediastinal lymphadenopathy in the AP window and suspicious malignant left pleural effusion and multifocal bone metastasis diagnosed in August 2021.  BIOMARKER(S) % CFDNA OR AMPLIFICATION ASSOCIATED FDA-APPROVED THERAPIES CLINICAL TRIAL AVAILABILITY [id number]* 0.2% None Yes  [id number] 0.2% None Yes  PRIOR THERAPY:  Status post left Pleurx placement for drainage of recurrent pleural effusion under the care of Dr. Roxan Hockey on 08/05/2020.  CURRENT THERAPY:  Systemic chemotherapy with carboplatin for AUC of 5, Alimta 500 mg/M2 and Keytruda 200 mg IV every 3 weeks.  First dose 08/15/2020.  INTERVAL HISTORY: Charles Robinson 74 y.o. male returns to the clinic today for follow-up visit accompanied by his son.  The patient continues to complain of increasing fatigue and weakness as well as weight loss.  His breathing is better after placement of the left Pleurx catheter under the care of Dr. Roxan Hockey last week.  The patient denied having any current chest pain but has shortness of breath with exertion with mild cough and no hemoptysis.  He denied having any fever or chills.  He has no nausea, vomiting, diarrhea or constipation.  He denied having any headache or visual changes.  He had molecular studies by [employer] that showed no actionable mutations.  The patient is here today for evaluation and discussion of his treatment options based on the molecular studies.   MEDICAL HISTORY: Past Medical History:  Diagnosis Date  . Arthritis    back  . BPH (benign prostatic hypertrophy)   . CAD S/P multivessel PCI:  LAD, RCA and extensive LCX-OM1 03/15/2012   s/p PCI to LAD, to RCA (now occluded), then extensive PCI to Dominant LCx-OM1  (enitre prox-AVG Circ for dissection in 07/2011)  . Cancer ([place])    SKIN CA  . Dyspnea   . Dysrhythmia    afib  . GERD (gastroesophageal reflux disease)   . Heart murmur   . History of colon polyps   . History of echocardiogram 08/16/2011   Echo - EF 60-65%; moderate LV concentric hypertrophy; abdnormal LV relaxation (grade 1 diastolic dysfunction; ascending aorta mildly dilated; LA moderately dilated;   . Hyperlipidemia   . Hypertension   . 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 PAF (paroxysmal atrial fibrillation) ([place]) 12/20/2012   followed by Dr. Glenetta Hew; s/p DCCV 2011, 07/2011 post PCI with Type 4a MI; CHA2DS2Vasc = 3, on Warfarin  . Paroxysmal atrial flutter ([place]) 08/20/2011   TEE- atrial septum - no defect identified; RA normal in size, no evidence of thrombus; ascending aorta normal  . Prostate cancer ([place])   . Rupture quadriceps tendon     ALLERGIES:  is allergic to lipitor [atorvastatin], morphine and related, and vicodin [hydrocodone-acetaminophen].  MEDICATIONS:  Current Outpatient Medications  Medication Sig Dispense Refill  . acetaminophen (TYLENOL) 325 MG tablet Take 650 mg by mouth every 6 (six) hours as needed for moderate pain.    [name] amiodarone (PACERONE) 200 MG tablet Take 1 tablet (200 mg total) by mouth daily.    . Coenzyme Q10 200 MG capsule Take 200 mg by mouth  every morning.     [name] ELIQUIS 5 MG TABS tablet TAKE 1 TABLET BY MOUTH TWICE A DAY (Patient taking differently: Take 5 mg by mouth 2 (two) times daily. ) 180 tablet 2  . ezetimibe (ZETIA) 10 MG tablet Take 1 tablet (10 mg total) by mouth daily. 90 tablet 2  . finasteride (PROSCAR) 5 MG tablet Take 5 mg by mouth every evening.     . hydroxypropyl methylcellulose / hypromellose (ISOPTO TEARS /  GONIOVISC) 2.5 % ophthalmic solution Place 1 drop into both eyes 3 (three) times daily as needed for dry eyes.    [name] Leuprolide Acetate (ELIGARD [place]) Inject into the skin. (Patient not taking: Reported on 08/01/2020)    . metoprolol tartrate (LOPRESSOR) 25 MG tablet Take 1 tablet (25 mg total) by mouth 2 (two) times daily. 180 tablet 3  . Multiple Vitamin (MULTIVITAMIN WITH MINERALS) TABS tablet Take 1 tablet by mouth every morning.     . nitroGLYCERIN (NITROSTAT) 0.4 MG SL tablet PLACE 1 TABLET (0.4 MG TOTAL) UNDER THE TONGUE EVERY 5 (FIVE) MINUTES AS NEEDED. FOR CHEST PAIN (Patient taking differently: Place 0.4 mg under the tongue every 5 (five) minutes as needed for chest pain. ) 25 tablet 4  . olmesartan (BENICAR) 20 MG tablet Take 20 mg by mouth daily.    . Omega-3 Fatty Acids (FISH OIL) 1000 MG CAPS Take 1 capsule (1,000 mg total) by mouth 2 (two) times daily. 60 capsule 0  . promethazine (PHENERGAN) 12.5 MG suppository Place 1 suppository (12.5 mg total) rectally every 6 (six) hours as needed for nausea or vomiting. 12 each 0  . rosuvastatin (CRESTOR) 40 MG tablet TAKE 1 TABLET BY MOUTH EVERY DAY (Patient taking differently: Take 40 mg by mouth daily. ) 90 tablet 1  . tamsulosin (FLOMAX) 0.4 MG CAPS capsule Take 0.4 mg by mouth daily.    . traMADol (ULTRAM) 50 MG tablet Take 1-2 tablets (50-100 mg total) by mouth every 6 (six) hours as needed for moderate pain or severe pain. 30 tablet 0  . VENTOLIN HFA 108 (90 Base) MCG/ACT inhaler Inhale into the lungs. (Patient not taking: Reported on 07/31/2020)     No current facility-administered medications for this visit.    SURGICAL HISTORY:  Past Surgical History:  Procedure Laterality Date  . BACK SURGERY    . CHEST TUBE INSERTION Left 08/05/2020   Procedure: INSERTION LEFT PLEURAL DRAINAGE CATHETER;  Surgeon: Melrose Nakayama, MD;  Location: [facility];  Service: Thoracic;  Laterality: Left;  . COLONOSCOPY    . CORONARY ANGIOPLASTY WITH STENT  PLACEMENT  03/15/2012   multiple stens in AVGroove Circ & OM1;; 2 site PTCA w/ PTCA and stenting of OM2 ang PTCA of distal stent ISR followed by PTCA of mid LCx ISR  . CORONARY STENT PLACEMENT  2003 - 2011 - 2012 - 2013   Pre-2012 - BMS in RCA now known occluded, BMS in proximal LAD; 07/2011 - 2.25 x 23 BMS in OM 1 with significant disease on either side noted shortly after -->  2 additional overlapping DES 2.5 mm x 30 mm and 2.25 mm x 26 mm in OM1,; 3 overlapping Resolute DES in AV groove Cx crossing OM1: (Tapered from 3.8-2.6 mm)- Resolute DES 3.5 x 22, 3.0 x 38, 2.5 x 14  . DOPPLER ECHOCARDIOGRAPHY  07/2011   EF ~60-65%, Grade 1 D Dysfxn; mod Conc LVH  . ENDARTERECTOMY Left 02/27/2016   Procedure: LEFT  CAROTID ARTERY ENDARTERECTOMY ;  Surgeon:  Serafina Mitchell, MD;  Location[facility] OR;  Service: Vascular;  Laterality: Left;  . LEFT HEART CATHETERIZATION WITH CORONARY ANGIOGRAM N/A 03/15/2012   Procedure: LEFT HEART CATHETERIZATION WITH CORONARY ANGIOGRAM;  Surgeon: [name], MD;  Location: [facility] CATH LAB: patent LAD stents, patent LCx stents w/80% focal ISR just distal to OM; OM proximal stent open; -- 2 site PTCA-PCI  . LUMBAR LAMINECTOMY/DECOMPRESSION MICRODISCECTOMY Bilateral 12/11/2014   Procedure: Bilateral Lumbar Three-Four Laminectomy;  Surgeon: Kristeen Miss, MD;  Location: [facility] NEURO ORS;  Service: Neurosurgery;  Laterality: Bilateral;  bilateral  . NM MYOVIEW LTD  11/2012   No ischemia or infarction, normal EF & WM  . PATCH ANGIOPLASTY Left 02/27/2016   Procedure: WITH HEMASHIELD DACRON  PATCH ANGIOPLASTY;  Surgeon: Serafina Mitchell, MD;  Location: [facility];  Service: Vascular;  Laterality: Left;  [name] QUADRICEPS TENDON REPAIR  09/19/2012   Procedure: REPAIR QUADRICEP TENDON;  Surgeon: [name], MD;  Location: [facility];  Service: Orthopedics;  Laterality: Left;  [name] VASECTOMY      REVIEW OF SYSTEMS:  Constitutional: positive for anorexia, fatigue and weight loss Eyes: negative Ears, nose, mouth,  throat, and face: negative Respiratory: positive for cough and dyspnea on exertion Cardiovascular: negative Gastrointestinal: negative Genitourinary:negative Integument/breast: negative Hematologic/lymphatic: negative Musculoskeletal:positive for muscle weakness Neurological: negative Behavioral/Psych: negative Endocrine: negative Allergic/Immunologic: negative   PHYSICAL EXAMINATION: General appearance: alert, cooperative, fatigued and no distress Head: Normocephalic, without obvious abnormality, atraumatic Neck: no adenopathy, no JVD, supple, symmetrical, trachea midline and thyroid not enlarged, symmetric, no tenderness/mass/nodules Lymph nodes: Cervical, supraclavicular, and axillary nodes normal. Resp: clear to auscultation bilaterally Back: symmetric, no curvature. ROM normal. No CVA tenderness. Cardio: regular rate and rhythm, S1, S2 normal, no murmur, click, rub or gallop GI: soft, non-tender; bowel sounds normal; no masses,  no organomegaly Extremities: extremities normal, atraumatic, no cyanosis or edema Neurologic: Alert and oriented X 3, normal strength and tone. Normal symmetric reflexes. Normal coordination and gait  ECOG PERFORMANCE STATUS: 1 - Symptomatic but completely ambulatory  Blood pressure 110/61, pulse 85, temperature 98.2 F (36.8 C), temperature source Tympanic, resp. rate 17, height '6\' 3"'  (1.905 m), weight 194 lb 1.6 oz (88 kg), SpO2 100 %.  LABORATORY DATA: Lab Results  Component Value Date   WBC 5.2 08/08/2020   HGB 11.6 (L) 08/08/2020   HCT 36.9 (L) 08/08/2020   MCV 90.9 08/08/2020   PLT 176 08/08/2020      Chemistry      Component Value Date/Time   NA 138 08/02/2020 0959   NA 140 08/15/2019 0936   K 3.9 08/02/2020 0959   CL 99 08/02/2020 0959   CO2 31 08/02/2020 0959   BUN 16 08/02/2020 0959   BUN 27 08/15/2019 0936   CREATININE 1.33 (H) 08/02/2020 0959   CREATININE 1.35 (H) 07/17/2020 1236   CREATININE 1.43 (H) 07/29/2016 0814       Component Value Date/Time   CALCIUM 9.0 08/02/2020 0959   ALKPHOS 84 08/02/2020 0959   AST 29 08/02/2020 0959   AST 15 07/17/2020 1236   ALT 28 08/02/2020 0959   ALT 13 07/17/2020 1236   BILITOT 0.9 08/02/2020 0959   BILITOT 0.9 07/17/2020 1236       RADIOGRAPHIC STUDIES: DG Chest 1 View  Result Date: 07/19/2020 CLINICAL DATA:  Post left thoracentesis EXAM: CHEST  1 VIEW COMPARISON:  07/11/2020 CT FINDINGS: Large left pleural effusion remains present. No pneumothorax following thoracentesis. Airspace disease throughout the left lung. Right  lung clear. Heart is normal size. IMPRESSION: Large left effusion remains following thoracentesis. No pneumothorax. Electronically Signed   By: Rolm Baptise M.D.   On: 07/19/2020 16:21   DG Chest 2 View  Result Date: 08/02/2020 CLINICAL DATA:  Preadmit for PleurX catheter placement. EXAM: CHEST - 2 VIEW COMPARISON:  Chest x-ray 07/31/2020 FINDINGS: Stable cardiomegaly. Stable left suprahilar fullness. Stable prominent left pleural effusion. Stable left pleural linear density. Lung markings are noted lateral this linear density suggesting that this linear density represent scarring. Right lung is clear. Thoracic spine scoliosis and degenerative change again noted. IMPRESSION: 1. Stable prominent left pleural effusion. Stable left pleural thickening. 2.  Stable left suprahilar prominence. 3.  Stable cardiomegaly. Electronically Signed   By: Marcello Moores  Register   On: 08/02/2020 12:50   DG Chest 2 View  Result Date: 07/31/2020 CLINICAL DATA:  Recurrent left pleural effusion. History of stage IV adenocarcinoma of the left lung. EXAM: CHEST - 2 VIEW COMPARISON:  Chest x-ray 07/19/2020 FINDINGS: Moderate to large left pleural effusion is noted approximately halfway up the thorax. Overlying atelectasis is again demonstrated. Stable fullness in the left hilum and AP window may be residual tumor or radiation change. The right lung is grossly clear.  Remote rib  fractures are noted. IMPRESSION: 1. Moderate to large left pleural effusion. 2. Stable left hilar and AP window fullness. Electronically Signed   By: Marijo Sanes M.D.   On: 07/31/2020 15:17   MR BRAIN W WO CONTRAST  Result Date: 07/26/2020 CLINICAL DATA:  Non-small cell lung carcinoma staging EXAM: MRI HEAD WITHOUT AND WITH CONTRAST TECHNIQUE: Multiplanar, multiecho pulse sequences of the brain and surrounding structures were obtained without and with intravenous contrast. CONTRAST:  47m GADAVIST GADOBUTROL 1 MMOL/ML IV SOLN COMPARISON:  None. FINDINGS: Brain: No acute infarct, acute hemorrhage or extra-axial collection. Normal white matter signal. Normal volume of CSF spaces. No chronic microhemorrhage. Normal midline structures. There is no abnormal contrast enhancement. Vascular: Abnormal left internal carotid artery flow void. Skull and upper cervical spine: Normal marrow signal. Sinuses/Orbits: Negative. Other: None. IMPRESSION: 1. No intracranial metastatic disease. 2. Abnormal left internal carotid artery flow void, consistent with reduced flow or occlusion. Correlation with carotid ultrasound or CTA of the neck is recommended. These results will be called to the ordering clinician or representative by the Radiologist Assistant, and communication documented in the PACS or [employer] Electronically Signed   By: KUlyses JarredM.D.   On: 07/26/2020 03:33   CT Chest High Resolution  Result Date: 07/11/2020 CLINICAL DATA:  Cough for 5 months. Abnormal chest radiograph. History of amiodarone therapy. History of prostate cancer. EXAM: CT CHEST WITHOUT CONTRAST TECHNIQUE: Multidetector CT imaging of the chest was performed following the standard protocol without intravenous contrast. High resolution imaging of the lungs, as well as inspiratory and expiratory imaging, was performed. COMPARISON:  06/30/2020 chest radiograph. FINDINGS: Cardiovascular: Normal heart size. Small pericardial effusion.  Three-vessel coronary atherosclerosis. Atherosclerotic thoracic aorta with ectatic 4.4 cm ascending thoracic aorta. Top-normal caliber main pulmonary artery (3.2 cm diameter). Mediastinum/Nodes: No discrete thyroid nodules. Unremarkable esophagus. No axillary adenopathy. No pathologically enlarged mediastinal or discrete hilar nodes on this noncontrast scan. Lungs/Pleura: No pneumothorax. No right pleural effusion. Large dependent left pleural effusion. Masslike focus of consolidation in posterior central left upper lobe measuring 6.3 x 3.5 cm (series 2/image 50), associated with occlusion of the left upper lobe bronchus. Near complete left lower lobe atelectasis. A few tiny right upper lobe pulmonary nodules, largest  0.2 cm posteriorly (series 7/image 46). No evidence of significant air trapping or tracheobronchomalacia on the expiration sequence. No significant regions of subpleural reticulation, traction bronchiectasis or frank honeycombing. Upper abdomen: Cholelithiasis. Simple 1.2 cm anterior segment 4 left liver cyst. A few additional scattered subcentimeter hypodense liver lesions are too small to characterize. Partially visualized exophytic simple appearing 3.8 cm upper right renal cyst. Cholelithiasis. Musculoskeletal: Lytic 2.3 cm sternal lesion (series 7/image 52). Lytic 3.2 cm posterior right fourth rib lesion (series 7/image 29). Minimally displaced posterior right seventh rib fracture with suggestion of underlying lytic lesion (series 7/image 64). Suggestion of additional indistinct lytic lesions scattered in the thoracic vertebral bodies, for example posteriorly at T11 (series 6/image 92). Mild bilateral gynecomastia. Moderate thoracic spondylosis. IMPRESSION: 1. Masslike 6.3 x 3.5 cm focus of consolidation in the posterior central left upper lobe, associated with occlusion of the left upper lobe bronchus. Findings are worrisome for primary bronchogenic carcinoma. 2. Large dependent left pleural  effusion, potentially malignant. 3. Lytic sternal and posterior right fourth rib lesions, suspicious for osseous metastases. Suggestion of pathologic posterior right seventh rib fracture. Suggestion of additional indistinct lytic lesions scattered in the thoracic vertebral bodies. 4. Multidisciplinary thoracic oncology consultation suggested. PET-CT suggested for further evaluation. 5. A few tiny right upper lobe pulmonary nodules, largest 0.2 cm, indeterminate. Recommend attention on follow-up chest CT in 3 months. 6. Small pericardial effusion. 7. Three-vessel coronary atherosclerosis. 8. Ectatic 4.4 cm ascending thoracic aorta. Recommend annual imaging followup by CTA or MRA. This recommendation follows 2010 ACCF/AHA/AATS/ACR/ASA/SCA/SCAI/SIR/STS/SVM Guidelines for the Diagnosis and Management of Patients with Thoracic Aortic Disease. Circulation. 2010; 121: J884-Z660. Aortic aneurysm NOS (ICD10-I71.9). 9. Cholelithiasis. 10. Aortic Atherosclerosis (ICD10-I70.0). These results will be called to the ordering clinician or representative by the Radiologist Assistant, and communication documented in the PACS or [employer]. Electronically Signed   By: Ilona Sorrel M.D.   On: 07/11/2020 16:51   NM PET Image Initial (PI) Skull Base To Thigh  Result Date: 07/16/2020 CLINICAL DATA:  Initial treatment strategy for lung mass with effusion. History of treated prostate carcinoma EXAM: NUCLEAR MEDICINE PET SKULL BASE TO THIGH TECHNIQUE: 11.2 mCi F-18 FDG was injected intravenously. Full-ring PET imaging was performed from the skull base to thigh after the radiotracer. CT data was obtained and used for attenuation correction and anatomic localization. Fasting blood glucose: 104 mg/dl COMPARISON:  CT 07/11/2020 FINDINGS: Mediastinal blood pool activity: SUV max 2.9 Liver activity: SUV max 3.3 NECK: No hypermetabolic lymph nodes in the neck. Incidental CT findings: none CHEST: Hypermetabolic activity in the LEFT hilum  posteriorly which localizes to the central portion of the atelectatic LEFT lower lobe. No focal mass lesion identified however central ill-defined activity which is intense with SUV max equal 6.3. Hypermetabolic activity also associated centrally within the atelectatic LEFT upper lobe with SUV max equal 5.0. Ill-defined hypermetabolic tissue extends into the AP window seen best on coronal image 159. There is a complete collapse of the LEFT lower lobe. Partial collapse of the LEFT upper lobe. Large LEFT pleural effusion. Mild metabolic activity associated with contralateral RIGHT lower paratracheal node. This node is difficult define on CT portion measuring approximately 1 cm image 75/4 and with moderate metabolic activity SUV max equal 4.2. Mild activity associated with small subcarinal node measuring 6 mm (80/4) with SUV max equal 3.2. No hypermetabolic supraclavicular adenopathy. Incidental CT findings: No nodularity in the RIGHT lung. ABDOMEN/PELVIS: No focal activity within the liver. No abnormal activity associated with the adrenal  glands. No hypermetabolic abdominopelvic lymph nodes. Diffuse metabolic activity associated with the bowel is favored physiologic. No abnormal activity in the prostate gland. Incidental CT findings: Small gallstones noted. Bilateral benign-appearing low-density renal cysts. Atherosclerotic calcification of the aorta. Mild sigmoid colon diverticulosis. SKELETON: Multifocal hypermetabolic skeletal metastasis. Lytic lesion in the upper sternum measuring 2.1 cm has intense metabolic activity with SUV max equal 8.8. Multiple lesions the cervical, thoracic and lumbar spine. Example lesion in the T12 vertebral body extends into the RIGHT pedicle measuring 2.6 cm with SUV max equal 10.6. Small lucent lesion with peripheral sclerosis measures 10 mm (image 118/series 4) at L1 with SUV max equal 7.0. Multiple bilateral hypermetabolic rib lesions. Probable pathologic fracture on posterior RIGHT  rib ninth rib with SUV max equal 6.8. Small lesion anterior aspect of the LEFT second rib with SUV max equals 6.6. Lytic mildly expansile lesion at L4 posterior on the RIGHT measures 2.6 cm (image 64/4) does not have metabolic activity. There is a lucent lesion the central body of the LEFT scapula with SUV max equal 6.9. Lucent lesion within the angle of the LEFT mandible with SUV max equal 6.8. This small lesion measuring 6 mm. (Image 27/4) Incidental CT findings: none IMPRESSION: 1. Hypermetabolic activity in the LEFT hilum involving the central portion of the atelectatic LEFT upper lobe and LEFT lower lobe. Ill-defined hypermetabolic tissue extends into the AP window. Findings are concerning for bronchogenic carcinoma. 2. Postobstructive collapse of the LEFT upper lobe and LEFT lower lobe. Large LEFT pleural effusion occupying 70% of the LEFT hemithorax. Recommend thoracentesis with cytology. 3. Mild metabolic activity of small mediastinal lymph nodes is indeterminate. 4. Multifocal hypermetabolic lytic skeletal metastasis. Differential would include lung cancer skeletal metastasis versus active multiple myeloma. Lytic lesions do not favor prostate cancer metastasis. Lesion in the mandible is typical location for multiple myeloma although given the LEFT lung findings bronchogenic carcinoma would be favored differential. Recommend tissue sampling (sternum potential site). These results will be called to the ordering clinician or representative by the Radiologist Assistant, and communication documented in the PACS or [employer]. Electronically Signed   By: Suzy Bouchard M.D.   On: 07/16/2020 13:13   DG C-Arm 1-60 Min-No Report  Result Date: 08/05/2020 Fluoroscopy was utilized by the requesting physician.  No radiographic interpretation.   VAS US CAROTID  Result Date: 07/15/2020 Carotid Arterial Duplex Study Indications:       Carotid artery disease, left endarterectomy and Left CE                     10/31/2018. Risk Factors:      Hypertension, no history of smoking, coronary artery disease. Comparison Study:  Prior duplex 12/25/2019 showed a 1-39% RICA stenosis and LICA                    occlusion. Performing Technologist: Delorise Shiner RVT  Examination Guidelines: A complete evaluation includes B-mode imaging, spectral Doppler, color Doppler, and power Doppler as needed of all accessible portions of each vessel. Bilateral testing is considered an integral part of a complete examination. Limited examinations for reoccurring indications may be performed as noted.  Right Carotid Findings: +----------+--------+--------+--------+------------------------+--------+           PSV cm/sEDV cm/sStenosisPlaque Description      Comments +----------+--------+--------+--------+------------------------+--------+ CCA Prox  76      27              hypoechoic and smooth            +----------+--------+--------+--------+------------------------+--------+  CCA Mid   73      24                                               +----------+--------+--------+--------+------------------------+--------+ CCA Distal63      24                                               +----------+--------+--------+--------+------------------------+--------+ ICA Prox  39      14      1-39%   homogeneous and calcific         +----------+--------+--------+--------+------------------------+--------+ ICA Mid   64      27                                               +----------+--------+--------+--------+------------------------+--------+ ICA Distal60      22                                      tortuous +----------+--------+--------+--------+------------------------+--------+ ECA       54      13                                               +----------+--------+--------+--------+------------------------+--------+ +----------+--------+-------+----------------+-------------------+           PSV  cm/sEDV cmsDescribe        Arm Pressure (mmHG) +----------+--------+-------+----------------+-------------------+ [id number]      0      Multiphasic, WNL                    +----------+--------+-------+----------------+-------------------+ +---------+--------+--+--------+--+---------+ VertebralPSV cm/s65EDV cm/s32Antegrade +---------+--------+--+--------+--+---------+  Left Carotid Findings: +----------+--------+--------+--------+----------------------+--------+           PSV cm/sEDV cm/sStenosisPlaque Description    Comments +----------+--------+--------+--------+----------------------+--------+ CCA Prox  67      8                                              +----------+--------+--------+--------+----------------------+--------+ CCA Mid   50      7                                              +----------+--------+--------+--------+----------------------+--------+ CCA Distal94      9       <50%    smooth and homogeneous         +----------+--------+--------+--------+----------------------+--------+ ICA Prox  0       0       Occludedcalcific                       +----------+--------+--------+--------+----------------------+--------+ ICA Mid   0       0  Occludedcalcific                       +----------+--------+--------+--------+----------------------+--------+ ICA Distal0       0       Occludedheterogenous                   +----------+--------+--------+--------+----------------------+--------+ ECA       59      12                                             +----------+--------+--------+--------+----------------------+--------+ +----------+--------+--------+----------------+-------------------+           PSV cm/sEDV cm/sDescribe        Arm Pressure (mmHG) +----------+--------+--------+----------------+-------------------+ [id number]      0       Multiphasic, WNL                     +----------+--------+--------+----------------+-------------------+ +---------+--------+--+--------+--+---------+ VertebralPSV cm/s50EDV cm/s19Antegrade +---------+--------+--+--------+--+---------+   Summary: Right Carotid: Velocities in the right ICA are consistent with a 1-39% stenosis. 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               Non-hemodynamically significant plaque <50% noted in the CCA. Vertebrals:  Bilateral vertebral arteries demonstrate antegrade flow. Subclavians: Normal flow hemodynamics were seen in bilateral subclavian              arteries. *See table(s) above for measurements and observations.  Electronically signed by Harold Barban MD on 07/15/2020 at 3:50:16 PM.    Final    US THORACENTESIS ASP PLEURAL SPACE W/IMG GUIDE  Result Date: 07/19/2020 INDICATION: Patient with prior history of prostate cancer; now with left lung mass, bone lesions, left pleural effusion. Request received for diagnostic and therapeutic left thoracentesis. EXAM: ULTRASOUND GUIDED DIAGNOSTIC AND THERAPEUTIC LEFT THORACENTESIS MEDICATIONS: 1% lidocaine to skin and subcutaneous tissue COMPLICATIONS: None immediate. PROCEDURE: An ultrasound guided thoracentesis was thoroughly discussed with the patient and questions answered. The benefits, risks, alternatives and complications were also discussed. The patient understands and wishes to proceed with the procedure. Written consent was obtained. Ultrasound was performed to localize and mark an adequate pocket of fluid in the left chest. The area was then prepped and draped in the normal sterile fashion. 1% Lidocaine was used for local anesthesia. Under ultrasound guidance a 6 Fr Safe-T-Centesis catheter was introduced. Thoracentesis was performed. The catheter was removed and a dressing applied. FINDINGS: A total of approximately 2.5 liters of blood-tinged fluid was removed. Samples were sent to the laboratory as requested by the  clinical team. Due to patient coughing only the above amount of fluid was removed today. IMPRESSION: Successful ultrasound guided diagnostic and therapeutic left thoracentesis yielding 2.5 liters of pleural fluid. Read by: Rowe Robert, PA-C Electronically Signed   By: Corrie Mckusick D.O.   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 The molecular studies by [facility] showed no actionable mutations.  We will try to collect a sample from the pleural effusion to send to [facility] for molecular studies and PD-L1 expression. I had a lengthy discussion with the patient and his son today about his current disease stage, prognosis and treatment options.  I explained to the patient that he has incurable condition and all the treatment will be of palliative nature. I gave the patient the option of palliative care and hospice referral versus consideration of palliative systemic chemotherapy with carboplatin for AUC of 5, Alimta 500 mg/M2 and Keytruda 200 mg IV every 3 weeks.  The patient is interested in proceeding with the treatment. I discussed with him the adverse effect of this treatment including but not limited to alopecia, myelosuppression, nausea and vomiting, peripheral neuropathy, liver or renal dysfunction as well as immunotherapy adverse effects.  I also provided the patient with handout of the medications and the after visit summary. He is expected to start the first cycle of this treatment on 08/15/2020. The patient will receive vitamin B12 injection today. I will call his  pharmacy with prescription for Compazine 10 mg p.o. every 6 hours as needed for nausea, folic acid 1 mg p.o. daily in addition to Remeron 30 mg p.o. nightly for depression and to help his appetite. If the patient has poor IV access, will consider him for Port-A-Cath placement. The patient will have a chemo education class before the first dose of his treatment. He will come back for follow-up visit in 2 weeks for evaluation and management of any adverse effect of his treatment. The patient was advised to call immediately if he has any concerning symptoms in the interval. The patient voices understanding of current disease status and treatment options and is in agreement with the current care plan.  All questions were answered. The patient knows to call the clinic with any problems, questions or concerns. We can certainly see the patient much sooner if necessary. The total time spent in the appointment was 50 minutes.  Disclaimer: This note was dictated with voice recognition software. Similar sounding words can inadvertently be transcribed and may not be corrected upon review.

## 2020-08-08 NOTE — Progress Notes (Signed)
START ON PATHWAY REGIMEN - Non-Small Cell Lung     A cycle is every 21 days:     Pembrolizumab      Pemetrexed      Carboplatin   **Always confirm dose/schedule in your pharmacy ordering system**  Patient Characteristics: Stage IV Metastatic, Nonsquamous, Initial Chemotherapy/Immunotherapy, PS = 0, 1, ALK Rearrangement Negative and ROS1 Rearrangement Negative and NTRK Gene Fusion?Negative and RET Gene Fusion?Negative and EGFR Mutation Negative, PD-L1 Expression Positive  1-49% (TPS) / Negative / Not Tested / Awaiting Test Results and Immunotherapy Candidate Therapeutic Status: Stage IV Metastatic Histology: Nonsquamous Cell ROS1 Rearrangement Status: Negative Other Mutations/Biomarkers: No Other Actionable Mutations Chemotherapy/Immunotherapy LOT: Initial Chemotherapy/Immunotherapy Molecular Targeted Therapy: Not Appropriate KRAS G12C Mutation Status: Negative MET Exon 14 Mutation Status: Negative RET Gene Fusion Status: Negative EGFR Mutation Status: Negative/Wild Type NTRK Gene Fusion Status: Negative PD-L1 Expression Status: Quantity Not Sufficient ALK Rearrangement Status: Negative BRAF V600E Mutation Status: Negative ECOG Performance Status: 1 Biomarker Assessment Status Confirmation: All Genomic Markers Negative, or Only MET+ or BRAF+ or KRAS G12C+ Immunotherapy Candidate Status: Candidate for Immunotherapy Intent of Therapy: Non-Curative / Palliative Intent, Discussed with Patient 

## 2020-08-08 NOTE — Patient Instructions (Signed)
Pembrolizumab injection What is this medicine? PEMBROLIZUMAB (pem broe liz ue mab) is a monoclonal antibody. It is used to treat certain types of cancer. This medicine may be used for other purposes; ask your health care provider or pharmacist if you have questions. COMMON BRAND NAME(S): Keytruda What should I tell my health care provider before I take this medicine? They need to know if you have any of these conditions:  diabetes  immune system problems  inflammatory bowel disease  liver disease  lung or breathing disease  lupus  received or scheduled to receive an organ transplant or a stem-cell transplant that uses donor stem cells  an unusual or allergic reaction to pembrolizumab, other medicines, foods, dyes, or preservatives  pregnant or trying to get pregnant  breast-feeding How should I use this medicine? This medicine is for infusion into a vein. It is given by a health care professional in a hospital or clinic setting. A special MedGuide will be given to you before each treatment. Be sure to read this information carefully each time. Talk to your pediatrician regarding the use of this medicine in children. While this drug may be prescribed for children as young as 6 months for selected conditions, precautions do apply. Overdosage: If you think you have taken too much of this medicine contact a poison control center or emergency room at once. NOTE: This medicine is only for you. Do not share this medicine with others. What if I miss a dose? It is important not to miss your dose. Call your doctor or health care professional if you are unable to keep an appointment. What may interact with this medicine? Interactions have not been studied. Give your health care provider a list of all the medicines, herbs, non-prescription drugs, or dietary supplements you use. Also tell them if you smoke, drink alcohol, or use illegal drugs. Some items may interact with your medicine. This  list may not describe all possible interactions. Give your health care provider a list of all the medicines, herbs, non-prescription drugs, or dietary supplements you use. Also tell them if you smoke, drink alcohol, or use illegal drugs. Some items may interact with your medicine. What should I watch for while using this medicine? Your condition will be monitored carefully while you are receiving this medicine. You may need blood work done while you are taking this medicine. Do not become pregnant while taking this medicine or for 4 months after stopping it. Women should inform their doctor if they wish to become pregnant or think they might be pregnant. There is a potential for serious side effects to an unborn child. Talk to your health care professional or pharmacist for more information. Do not breast-feed an infant while taking this medicine or for 4 months after the last dose. What side effects may I notice from receiving this medicine? Side effects that you should report to your doctor or health care professional as soon as possible:  allergic reactions like skin rash, itching or hives, swelling of the face, lips, or tongue  bloody or black, tarry  breathing problems  changes in vision  chest pain  chills  confusion  constipation  cough  diarrhea  dizziness or feeling faint or lightheaded  fast or irregular heartbeat  fever  flushing  joint pain  low blood counts - this medicine may decrease the number of white blood cells, red blood cells and platelets. You may be at increased risk for infections and bleeding.  muscle pain  muscle  weakness  pain, tingling, numbness in the hands or feet  persistent headache  redness, blistering, peeling or loosening of the skin, including inside the mouth  signs and symptoms of high blood sugar such as dizziness; dry mouth; dry skin; fruity breath; nausea; stomach pain; increased hunger or thirst; increased urination  signs  and symptoms of kidney injury like trouble passing urine or change in the amount of urine  signs and symptoms of liver injury like dark urine, light-colored stools, loss of appetite, nausea, right upper belly pain, yellowing of the eyes or skin  sweating  swollen lymph nodes  weight loss Side effects that usually do not require medical attention (report to your doctor or health care professional if they continue or are bothersome):  decreased appetite  hair loss  muscle pain  tiredness This list may not describe all possible side effects. Call your doctor for medical advice about side effects. You may report side effects to FDA at 1-800-FDA-1088. Where should I keep my medicine? This drug is given in a hospital or clinic and will not be stored at home. NOTE: This sheet is a summary. It may not cover all possible information. If you have questions about this medicine, talk to your doctor, pharmacist, or health care provider.  2020 Elsevier/Gold Standard (2019-09-15 18:07:58) Pemetrexed injection What is this medicine? PEMETREXED (PEM e TREX ed) is a chemotherapy drug used to treat lung cancers like non-small cell lung cancer and mesothelioma. It may also be used to treat other cancers. This medicine may be used for other purposes; ask your health care provider or pharmacist if you have questions. COMMON BRAND NAME(S): Alimta What should I tell my health care provider before I take this medicine? They need to know if you have any of these conditions:  infection (especially a virus infection such as chickenpox, cold sores, or herpes)  kidney disease  low blood counts, like low white cell, platelet, or red cell counts  lung or breathing disease, like asthma  radiation therapy  an unusual or allergic reaction to pemetrexed, other medicines, foods, dyes, or preservative  pregnant or trying to get pregnant  breast-feeding How should I use this medicine? This drug is given as  an infusion into a vein. It is administered in a hospital or clinic by a specially trained health care professional. Talk to your pediatrician regarding the use of this medicine in children. Special care may be needed. Overdosage: If you think you have taken too much of this medicine contact a poison control center or emergency room at once. NOTE: This medicine is only for you. Do not share this medicine with others. What if I miss a dose? It is important not to miss your dose. Call your doctor or health care professional if you are unable to keep an appointment. What may interact with this medicine? This medicine may interact with the following medications:  Ibuprofen This list may not describe all possible interactions. Give your health care provider a list of all the medicines, herbs, non-prescription drugs, or dietary supplements you use. Also tell them if you smoke, drink alcohol, or use illegal drugs. Some items may interact with your medicine. What should I watch for while using this medicine? Visit your doctor for checks on your progress. This drug may make you feel generally unwell. This is not uncommon, as chemotherapy can affect healthy cells as well as cancer cells. Report any side effects. Continue your course of treatment even though you feel ill unless  your doctor tells you to stop. In some cases, you may be given additional medicines to help with side effects. Follow all directions for their use. Call your doctor or health care professional for advice if you get a fever, chills or sore throat, or other symptoms of a cold or flu. Do not treat yourself. This drug decreases your body's ability to fight infections. Try to avoid being around people who are sick. This medicine may increase your risk to bruise or bleed. Call your doctor or health care professional if you notice any unusual bleeding. Be careful brushing and flossing your teeth or using a toothpick because you may get an  infection or bleed more easily. If you have any dental work done, tell your dentist you are receiving this medicine. Avoid taking products that contain aspirin, acetaminophen, ibuprofen, naproxen, or ketoprofen unless instructed by your doctor. These medicines may hide a fever. Call your doctor or health care professional if you get diarrhea or mouth sores. Do not treat yourself. To protect your kidneys, drink water or other fluids as directed while you are taking this medicine. Do not become pregnant while taking this medicine or for 6 months after stopping it. Women should inform their doctor if they wish to become pregnant or think they might be pregnant. Men should not father a child while taking this medicine and for 3 months after stopping it. This may interfere with the ability to father a child. You should talk to your doctor or health care professional if you are concerned about your fertility. There is a potential for serious side effects to an unborn child. Talk to your health care professional or pharmacist for more information. Do not breast-feed an infant while taking this medicine or for 1 week after stopping it. What side effects may I notice from receiving this medicine? Side effects that you should report to your doctor or health care professional as soon as possible:  allergic reactions like skin rash, itching or hives, swelling of the face, lips, or tongue  breathing problems  redness, blistering, peeling or loosening of the skin, including inside the mouth  signs and symptoms of bleeding such as bloody or black, tarry stools; red or dark-brown urine; spitting up blood or brown material that looks like coffee grounds; red spots on the skin; unusual bruising or bleeding from the eye, gums, or nose  signs and symptoms of infection like fever or chills; cough; sore throat; pain or trouble passing urine  signs and symptoms of kidney injury like trouble passing urine or change in the  amount of urine  signs and symptoms of liver injury like dark yellow or brown urine; general ill feeling or flu-like symptoms; light-colored stools; loss of appetite; nausea; right upper belly pain; unusually weak or tired; yellowing of the eyes or skin Side effects that usually do not require medical attention (report to your doctor or health care professional if they continue or are bothersome):  constipation  mouth sores  nausea, vomiting  unusually weak or tired This list may not describe all possible side effects. Call your doctor for medical advice about side effects. You may report side effects to FDA at 1-800-FDA-1088. Where should I keep my medicine? This drug is given in a hospital or clinic and will not be stored at home. NOTE: This sheet is a summary. It may not cover all possible information. If you have questions about this medicine, talk to your doctor, pharmacist, or health care provider.  2020  Elsevier/Gold Standard (2017-12-29 16:11:33) Carboplatin injection What is this medicine? CARBOPLATIN (KAR boe pla tin) is a chemotherapy drug. It targets fast dividing cells, like cancer cells, and causes these cells to die. This medicine is used to treat ovarian cancer and many other cancers. This medicine may be used for other purposes; ask your health care provider or pharmacist if you have questions. COMMON BRAND NAME(S): Paraplatin What should I tell my health care provider before I take this medicine? They need to know if you have any of these conditions:  blood disorders  hearing problems  kidney disease  recent or ongoing radiation therapy  an unusual or allergic reaction to carboplatin, cisplatin, other chemotherapy, other medicines, foods, dyes, or preservatives  pregnant or trying to get pregnant  breast-feeding How should I use this medicine? This drug is usually given as an infusion into a vein. It is administered in a hospital or clinic by a specially  trained health care professional. Talk to your pediatrician regarding the use of this medicine in children. Special care may be needed. Overdosage: If you think you have taken too much of this medicine contact a poison control center or emergency room at once. NOTE: This medicine is only for you. Do not share this medicine with others. What if I miss a dose? It is important not to miss a dose. Call your doctor or health care professional if you are unable to keep an appointment. What may interact with this medicine?  medicines for seizures  medicines to increase blood counts like filgrastim, pegfilgrastim, sargramostim  some antibiotics like amikacin, gentamicin, neomycin, streptomycin, tobramycin  vaccines Talk to your doctor or health care professional before taking any of these medicines:  acetaminophen  aspirin  ibuprofen  ketoprofen  naproxen This list may not describe all possible interactions. Give your health care provider a list of all the medicines, herbs, non-prescription drugs, or dietary supplements you use. Also tell them if you smoke, drink alcohol, or use illegal drugs. Some items may interact with your medicine. What should I watch for while using this medicine? Your condition will be monitored carefully while you are receiving this medicine. You will need important blood work done while you are taking this medicine. This drug may make you feel generally unwell. This is not uncommon, as chemotherapy can affect healthy cells as well as cancer cells. Report any side effects. Continue your course of treatment even though you feel ill unless your doctor tells you to stop. In some cases, you may be given additional medicines to help with side effects. Follow all directions for their use. Call your doctor or health care professional for advice if you get a fever, chills or sore throat, or other symptoms of a cold or flu. Do not treat yourself. This drug decreases your body's  ability to fight infections. Try to avoid being around people who are sick. This medicine may increase your risk to bruise or bleed. Call your doctor or health care professional if you notice any unusual bleeding. Be careful brushing and flossing your teeth or using a toothpick because you may get an infection or bleed more easily. If you have any dental work done, tell your dentist you are receiving this medicine. Avoid taking products that contain aspirin, acetaminophen, ibuprofen, naproxen, or ketoprofen unless instructed by your doctor. These medicines may hide a fever. Do not become pregnant while taking this medicine. Women should inform their doctor if they wish to become pregnant or think they might  be pregnant. There is a potential for serious side effects to an unborn child. Talk to your health care professional or pharmacist for more information. Do not breast-feed an infant while taking this medicine. What side effects may I notice from receiving this medicine? Side effects that you should report to your doctor or health care professional as soon as possible:  allergic reactions like skin rash, itching or hives, swelling of the face, lips, or tongue  signs of infection - fever or chills, cough, sore throat, pain or difficulty passing urine  signs of decreased platelets or bleeding - bruising, pinpoint red spots on the skin, black, tarry stools, nosebleeds  signs of decreased red blood cells - unusually weak or tired, fainting spells, lightheadedness  breathing problems  changes in hearing  changes in vision  chest pain  high blood pressure  low blood counts - This drug may decrease the number of white blood cells, red blood cells and platelets. You may be at increased risk for infections and bleeding.  nausea and vomiting  pain, swelling, redness or irritation at the injection site  pain, tingling, numbness in the hands or feet  problems with balance, talking,  walking  trouble passing urine or change in the amount of urine Side effects that usually do not require medical attention (report to your doctor or health care professional if they continue or are bothersome):  hair loss  loss of appetite  metallic taste in the mouth or changes in taste This list may not describe all possible side effects. Call your doctor for medical advice about side effects. You may report side effects to FDA at 1-800-FDA-1088. Where should I keep my medicine? This drug is given in a hospital or clinic and will not be stored at home. NOTE: This sheet is a summary. It may not cover all possible information. If you have questions about this medicine, talk to your doctor, pharmacist, or health care provider.  2020 Elsevier/Gold Standard (2008-02-14 14:38:05) Lung Cancer Lung cancer is an abnormal growth of cancerous cells that forms a mass (malignant tumor) in a lung. There are several types of lung cancer. The types are based on the appearance of the tumor cells. The two most common types are:  Non-small cell lung cancer. This type of lung cancer is the most common type. Non-small cell lung cancers include squamous cell carcinoma, adenocarcinoma, and large cell carcinoma.  Small cell lung cancer. In this type of lung cancer, abnormal cells are smaller than those of non-small cell lung cancer. Small cell lung cancer gets worse (progresses) faster than non-small cell lung cancer. What are the causes? The most common cause of lung cancer is smoking tobacco. The second most common cause is exposure to a chemical called radon. What increases the risk? You are more likely to develop this condition if:  You smoke tobacco.  You have been exposed to: ? Secondhand tobacco smoke. ? Radon gas. ? Uranium. ? Asbestos. ? Arsenic in drinking water. ? Air pollution.  You have a family or personal history of lung cancer.  You have had lung radiation therapy in the past.  You  are older than age 38. What are the signs or symptoms? In the early stages, you may not have any symptoms. As the cancer progresses, symptoms may include:  A lasting cough, possibly with blood.  Fatigue.  Unexplained weight loss.  Shortness of breath.  Loud breathing (wheezing).  Chest pain.  Loss of appetite. Symptoms of advanced lung cancer  include:  Hoarseness.  Bone or joint pain.  Weakness.  Change in the structure of the fingernails (clubbing), so that the nail looks like an upside-down spoon.  Swelling of the face or arms.  Inability to move the face (paralysis).  Drooping eyelids. How is this diagnosed? This condition may be diagnosed based on:  Your symptoms and medical history.  A physical exam.  A chest X-ray.  A CT scan.  Blood tests.  Sputum tests.  Removal of a sample of lung tissue (lung biopsy) for testing. Your cancer will be assessed (staged) to determine how severe it is and how much it has spread (metastasized). How is this treated? Treatment depends on the type and stage of your cancer. Treatment may include one or more of the following:  Surgery to remove as much of the cancer as possible. Lymph nodes in the area may be removed and tested for cancer as well.  Medicines that kill cancer cells (chemotherapy).  High-energy rays that kill cancer cells (radiation therapy).  Chemotherapy. This treatment uses medicines to destroy cancer cells.  Targeted therapy. This targets specific parts of cancer cells and the area around them to block the growth and spread of the cancer. Targeted therapy can help limit the damage to healthy cells. Follow these instructions at home: Eating and drinking  Some of your treatments might affect your appetite. If you are having problems eating, or if you do not have an appetite, meet with a dietitian.  If you have side effects that affect your appetite, it may help to: ? Eat smaller meals and snacks  often. ? Drink high-nutrition and high-calorie shakes or supplements. ? Eat bland and soft foods that are easy to eat. ? Avoid eating foods that are hot, spicy, or hard to swallow. General instructions   Do not use any products that contain nicotine or tobacco, such as cigarettes and e-cigarettes. If you need help quitting, ask your health care provider.  Do not drink alcohol.  If you are admitted to the hospital, make sure your cancer specialist (oncologist) is aware. Your cancer may affect your treatment for other conditions.  Take over-the-counter and prescription medicines only as told by your health care provider.  Consider joining a support group for people who have been diagnosed with lung cancer.  Work with your health care provider to manage any side effects of treatment.  Keep all follow-up visits as told by your health care provider. This is important. Where to find more information  American Cancer Society: https://www.cancer.[place] https://www.cancer.gov Contact a health care provider if you:  Lose weight without trying.  Have a persistent cough and wheezing.  Feel short of breath.  Get tired easily.  Have bone or joint pain.  Have difficulty swallowing.  Notice that your voice is changing or getting hoarse.  Have pain that does not get better with medicine. Get help right away if you:  Cough up blood.  Have new breathing problems.  Have chest pain.  Have a fever.  Have swelling in an ankle, leg, or arm, or the face or neck.  Have paralysis in your face.  Are very confused.  Have a drooping eyelid. Summary  Lung cancer is an abnormal growth of cancerous cells that forms a mass (malignant tumor) in a lung.  There are several types of lung cancer. The types are based on the appearance of the tumor cells. The two most common types are non-small cell and small cell.  The most common cause of lung cancer is  smoking tobacco.  Early symptoms include a lasting cough, possibly with blood, and fatigue, unexplained weight loss, and shortness of breath.  After diagnosis, treatment depends on the type and stage of your cancer. This information is not intended to replace advice given to you by your health care provider. Make sure you discuss any questions you have with your health care provider. Document Revised: 10/22/2017 Document Reviewed: 09/16/2017 Elsevier Patient Education  2020 [employer].

## 2020-08-08 NOTE — Progress Notes (Signed)
I spoke with Charles Robinson today.  Unfortunately, Mr. Lamagna has been dx with stage IV lung cancer.  I gave and explained information on dx and plan of care.  Per Dr. Julien Nordmann, I contacted path dept to see if there is enough tissue to send for Foundation One and PDL 1.  I contacted them and will wait for a response.  If there is not enough tissue I will work with patient and his family to get fluid from his pleur x cath drainage.

## 2020-08-09 ENCOUNTER — Ambulatory Visit: Payer: Medicare Other

## 2020-08-09 ENCOUNTER — Encounter: Payer: Self-pay | Admitting: *Deleted

## 2020-08-09 ENCOUNTER — Encounter: Payer: Self-pay | Admitting: Urology

## 2020-08-09 ENCOUNTER — Other Ambulatory Visit: Payer: Self-pay | Admitting: *Deleted

## 2020-08-09 ENCOUNTER — Other Ambulatory Visit: Payer: Self-pay | Admitting: Urology

## 2020-08-09 ENCOUNTER — Ambulatory Visit
Admission: RE | Admit: 2020-08-09 | Discharge: 2020-08-09 | Disposition: A | Discharge status: Home / Self Care | Payer: Medicare Other | Source: Ambulatory Visit | Attending: Urology | Admitting: Urology

## 2020-08-09 ENCOUNTER — Other Ambulatory Visit: Payer: Self-pay

## 2020-08-09 ENCOUNTER — Telehealth: Payer: Self-pay | Admitting: Internal Medicine

## 2020-08-09 ENCOUNTER — Ambulatory Visit
Admission: RE | Admit: 2020-08-09 | Discharge: 2020-08-09 | Disposition: A | Discharge status: Home / Self Care | Payer: Medicare Other | Source: Ambulatory Visit | Attending: Radiation Oncology | Admitting: Radiation Oncology

## 2020-08-09 ENCOUNTER — Telehealth: Payer: Self-pay | Admitting: Cardiology

## 2020-08-09 ENCOUNTER — Inpatient Hospital Stay: Payer: Medicare Other

## 2020-08-09 VITALS — BP 87/59 | HR 72 | Temp 98.7°F | Resp 18 | Ht 75.0 in | Wt 183.1 lb

## 2020-08-09 DIAGNOSIS — C61 Malignant neoplasm of prostate: Secondary | ICD-10-CM

## 2020-08-09 DIAGNOSIS — C3492 Malignant neoplasm of unspecified part of left bronchus or lung: Secondary | ICD-10-CM

## 2020-08-09 DIAGNOSIS — Z5111 Encounter for antineoplastic chemotherapy: Secondary | ICD-10-CM | POA: Diagnosis not present

## 2020-08-09 DIAGNOSIS — C7951 Secondary malignant neoplasm of bone: Secondary | ICD-10-CM

## 2020-08-09 MED ORDER — SODIUM CHLORIDE 0.9 % IV SOLN
INTRAVENOUS | Status: DC
  Filled 2020-08-09: qty 250

## 2020-08-09 NOTE — Patient Instructions (Signed)

## 2020-08-09 NOTE — Telephone Encounter (Signed)
Patient's wife is calling about him having low BP.  Patient is currently at the [facility], they are going to give him some IV fluid.  They took his BP 3x's the readings they got were: 71/51 87/59 82/51  They would like for Dr. Ellyn Hack to review his medication and see if he would like to adjust them.

## 2020-08-09 NOTE — Telephone Encounter (Signed)
Left message to call back  

## 2020-08-09 NOTE — Progress Notes (Signed)
I followed up on Charles Robinson schedule.  He has not been set up for chemo ed or his infusion.  I reached out to scheduling team and they are working on appts.

## 2020-08-09 NOTE — Progress Notes (Signed)
I received a message from Mr. Desa son, [place] today.  I noticed that Mr. Testerman was here seeing rad onc so I went to see him thinking his son was with him. He was not but I did speak to him and his wife today.  I asked if I could call and talk to [place] about his case, Mr. Pott agreed.  I called and spoke to his son.  He had great questions about his fathers tx plan of care and prognosis. I clarified his questions.  [place]ali will bring patients pleural fluid on Monday 9/20 to the cancer center and I will take this to the lab.  I did get an update from path dept that his last bx there was not enough tissue to send for moleculars.  I will update Dr. Julien Nordmann.

## 2020-08-09 NOTE — Telephone Encounter (Signed)
Returned call to wife (ok per DPR).  Wife states patient went to appt today with radiologist and his BP was 71/51.   They gave him IV fluid (1L) and BP increased.  He is home now and BP 118/58 HR 72, he denies dizziness, lightheadedness, CP, SOB.   They told him they believed he was dehydrated.  Wife states he did fall in the floor during the night last night.  Wife states he has been diagnosed with lung cancer and has no appetite.   He does drink protein shakes and smoothies.  She was wondering if some of his blood pressure medications need to be adjusted.     He is currently taking: Metoprolol 25 BID olmesartan 20 mg daily Amiodarone 200 mg daily  Spoke with Almyra Deforest PA-ok to hold olmesartan and monitor BP.  If BP is 135 or higher, ok to take olmesartan.    Patient aware and verbalized understanding.  He will call next week with BP readings to review.

## 2020-08-09 NOTE — Progress Notes (Signed)
Radiation Oncology         [phone]) [phone] ________________________________  Outpatient Re-Consultation  Name: Charles Robinson MRN: [id number]  Date of Service: 08/09/2020 DOB: January 10, 1946  YI:AXKP, [name], MD  [name], MD   REFERRING PHYSICIAN: [name], MD  DIAGNOSIS: 74 y/o male with newly diagnosed stage IV lung cancer with malignant pleural effusion and bone metastases.    ICD-10-CM   1. Adenocarcinoma of left lung, stage 4 (HCC)  C34.92   2. Metastasis to bone [facility])  C79.51     HISTORY OF PRESENT ILLNESS: Charles Robinson is a 74 y.o. male seen at the request of Dr. Julien Nordmann. We recently met the patient in 10/2019 for Gleason 4+5, high risk prostate cancer which was treated with LT-ADT concurrent with 8 weeks of external beam radiation, completed in May 2021.  He had been complaining of a dry, hacking cough for approximately 5 months and took a fall at home prompting ER evaluation on 06/30/2020. Chest x-ray performed at that time showed a small left pleural effusion with patchy left perihilar lung opacity and volume loss in the left hemithorax.  This prompted further evaluation with a high resolution chest CT on 07/11/2020 which demonstrated a mass-like, 6.3 cm focus of consolidation in the posterior central LUL, with associated LUL bronchus occlusion. Additionally, there was a large dependent left pleural effusion as well as multiple lytic lesions in the axial and appendicular skeleton.  PET scan was performed on 07/16/2020 confirming hypermetabolic activity in the left hilum with ill-defined tissue extending into the AP window with postobstructive collapse of the left upper lobe and left lower lobe as well as a large left pleural effusion occupying 70% of left hemithorax.  There was indeterminate mild metabolic activity of small mediastinal lymph nodes and multifocal hypermetabolic lytic skeletal metastasis, not felt to favor metastatic prostate cancer.  He was referred to Dr.  Julien Nordmann on 07/17/2020 to discuss potential treatment options.  He proceeded to thoracentesis for both therapeutic and diagnostic purposes on 07/19/2020. Due to patient coughing, only 2.5 L were removed at that time. Cytology from the procedure confirmed malignant cells consistent with metastatic adenocarcinoma and unfortunately, molecular studies by [employer] showed no actionable mutations.  He completed staging work up with brain MRI on 07/25/2020 showing no intracranial metastatic disease.  He also underwent left Pleurx catheter placement by Dr. Roxan Hockey on 08/05/2020 with significant improvement in his breathing and reduced coughing. He continues with very poor appetite, generalized weakness and malaise and fell at home last night.  He has been referred today for consideration of palliative radiotherapy to the painful sites of skeletal metastases and is scheduled to begin systemic chemotherapy with carboplatin, Alimta, and Keytruda on 08/15/2020 under the care of Dr. Julien Nordmann.  PREVIOUS RADIATION THERAPY: Yes  02/26/2020 - 04/19/20:  (concurrent with LT-ADT) 1. The prostate, seminal vesicles, and pelvic lymph nodes were initially treated to 45 Gy in 25 fractions of 1.8 Gy  2. The prostate only was boosted to 75 Gy with 15 additional fractions of 2.0 Gy   PAST MEDICAL HISTORY:  Past Medical History:  Diagnosis Date  . Arthritis    back  . BPH (benign prostatic hypertrophy)   . CAD S/P multivessel PCI: LAD, RCA and extensive LCX-OM1 03/15/2012   s/p PCI to LAD, to RCA (now occluded), then extensive PCI to Dominant LCx-OM1  (enitre prox-AVG Circ for dissection in 07/2011)  . Cancer ([place])    SKIN CA  . Dyspnea   .  Dysrhythmia    afib  . GERD (gastroesophageal reflux disease)   . Heart murmur   . History of colon polyps   . History of echocardiogram 08/16/2011   Echo - EF 60-65%; moderate LV concentric hypertrophy; abdnormal LV relaxation (grade 1 diastolic dysfunction; ascending aorta  mildly dilated; LA moderately dilated;   . Hyperlipidemia   . Hypertension   . 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 PAF (paroxysmal atrial fibrillation) ([place]) 12/20/2012   followed by Dr. Glenetta [facility]; s/p DCCV 2011, 07/2011 post PCI with Type 4a MI; CHA2DS2Vasc = 3, on Warfarin  . Paroxysmal atrial flutter ([place]) 08/20/2011   TEE- atrial septum - no defect identified; RA normal in size, no evidence of thrombus; ascending aorta normal  . Prostate cancer ([place])   . Rupture quadriceps tendon       PAST SURGICAL HISTORY: Past Surgical History:  Procedure Laterality Date  . BACK SURGERY    . CHEST TUBE INSERTION Left 08/05/2020   Procedure: INSERTION LEFT PLEURAL DRAINAGE CATHETER;  Surgeon: Melrose Nakayama, MD;  Location: [place];  Service: Thoracic;  Laterality: Left;  . COLONOSCOPY    . CORONARY ANGIOPLASTY WITH STENT PLACEMENT  03/15/2012   multiple stens in AVGroove Circ & OM1;; 2 site PTCA w/ PTCA and stenting of OM2 ang PTCA of distal stent ISR followed by PTCA of mid LCx ISR  . CORONARY STENT PLACEMENT  2003 - 2011 - 2012 - 2013   Pre-2012 - BMS in RCA now known occluded, BMS in proximal LAD; 07/2011 - 2.25 x 23 BMS in OM 1 with significant disease on either side noted shortly after -->  2 additional overlapping DES 2.5 mm x 30 mm and 2.25 mm x 26 mm in OM1,; 3 overlapping Resolute DES in AV groove Cx crossing OM1: (Tapered from 3.8-2.6 mm)- Resolute DES 3.5 x 22, 3.0 x 38, 2.5 x 14  . DOPPLER ECHOCARDIOGRAPHY  07/2011   EF ~60-65%, Grade 1 D Dysfxn; mod Conc LVH  . ENDARTERECTOMY Left 02/27/2016   Procedure: LEFT  CAROTID ARTERY ENDARTERECTOMY ;  Surgeon: Serafina Mitchell, MD;  Location: [place];  Service: Vascular;  Laterality: Left;  . LEFT HEART CATHETERIZATION WITH CORONARY ANGIOGRAM N/A 03/15/2012   Procedure: LEFT HEART CATHETERIZATION WITH CORONARY ANGIOGRAM;  Surgeon: [name], MD;   Location: [facility] CATH LAB: patent LAD stents, patent LCx stents w/80% focal ISR just distal to OM; OM proximal stent open; -- 2 site PTCA-PCI  . LUMBAR LAMINECTOMY/DECOMPRESSION MICRODISCECTOMY Bilateral 12/11/2014   Procedure: Bilateral Lumbar Three-Four Laminectomy;  Surgeon: Kristeen Miss, MD;  Location: [facility];  Service: Neurosurgery;  Laterality: Bilateral;  bilateral  . NM MYOVIEW LTD  11/2012   No ischemia or infarction, normal EF & WM  . PATCH ANGIOPLASTY Left 02/27/2016   Procedure: WITH HEMASHIELD DACRON  PATCH ANGIOPLASTY;  Surgeon: Serafina Mitchell, MD;  Location: [place];  Service: Vascular;  Laterality: Left;  [name] QUADRICEPS TENDON REPAIR  09/19/2012   Procedure: REPAIR QUADRICEP TENDON;  Surgeon: [name], MD;  Location: [facility];  Service: Orthopedics;  Laterality: Left;  [name] VASECTOMY      FAMILY HISTORY:  Family History  Problem Relation Age of Onset  . Leukemia Mother   . Heart disease Father   . Bladder Cancer Sister   . Prostate cancer Brother   . Breast cancer Maternal Grandmother     SOCIAL HISTORY:  Social History   Socioeconomic History  . Marital status: Married    Spouse name: Not on file  . Number of children: 2  . Years of education: Not on file  . Highest education level: Not on file  Occupational History  . Not on file  Tobacco Use  . Smoking status: Never Smoker  . Smokeless tobacco: Never Used  Vaping Use  . Vaping Use: Never used  Substance and Sexual Activity  . Alcohol use: Yes    Alcohol/week: 2.0 standard drinks    Types: 2 Cans of beer per week    Comment: rare  . Drug use: No  . Sexual activity: Not Currently  Other Topics Concern  . Not on file  Social History Narrative   [name] is a father of 2, grandfather of 85.  His daughter Barbaraann Share was classmates with Dr. Ellyn Hack in college.   He and his wife are currently in the process of moving houses, further down the street in order to downsize to a single-story house.  He has been working out  at [employer].      Social Determinants of Health   Financial Resource Strain:   . Difficulty of Paying Living Expenses: Not on file  Food Insecurity:   . Worried About Charity fundraiser in the Last Year: Not on file  . Ran Out of Food in the Last Year: Not on file  Transportation Needs:   . Lack of Transportation (Medical): Not on file  . Lack of Transportation (Non-Medical): Not on file  Physical Activity:   . Days of Exercise per Week: Not on file  . Minutes of Exercise per Session: Not on file  Stress:   . Feeling of Stress : Not on file  Social Connections:   . Frequency of Communication with Friends and Family: Not on file  . Frequency of Social Gatherings with Friends and Family: Not on file  . Attends Religious Services: Not on file  . Active Member of Clubs or Organizations: Not on file  . Attends Archivist Meetings: Not on file  . Marital Status: Not on file  Intimate Partner Violence:   . Fear of Current or Ex-Partner: Not on file  . Emotionally Abused: Not on file  . Physically Abused: Not on file  . Sexually Abused: Not on file    ALLERGIES: Lipitor [atorvastatin], Morphine and related, and Vicodin [hydrocodone-acetaminophen]  MEDICATIONS:  Current Outpatient Medications  Medication Sig Dispense Refill  . acetaminophen (TYLENOL) 325 MG tablet Take 650 mg by mouth every 6 (six) hours as needed for moderate pain.    [name] amiodarone (PACERONE) 200 MG tablet Take 1 tablet (200 mg total) by mouth daily.    . Coenzyme Q10 200 MG capsule Take 200 mg by mouth every morning.     [name] ELIQUIS 5 MG TABS tablet TAKE 1 TABLET BY MOUTH TWICE A DAY (Patient taking differently: Take 5 mg by mouth 2 (two) times daily. ) 180 tablet 2  . ezetimibe (ZETIA) 10 MG tablet Take 1 tablet (10 mg total) by mouth daily. 90 tablet 2  . finasteride (PROSCAR) 5 MG tablet Take 5 mg by mouth every evening.     . folic acid (FOLVITE) 1 MG tablet Take 1 tablet (1 mg total) by mouth daily.  30 tablet 4  . hydroxypropyl methylcellulose / hypromellose (ISOPTO TEARS / GONIOVISC) 2.5 % ophthalmic solution Place 1 drop into both eyes 3 (three) times daily as needed for dry  eyes.    . metoprolol tartrate (LOPRESSOR) 25 MG tablet Take 1 tablet (25 mg total) by mouth 2 (two) times daily. 180 tablet 3  . mirtazapine (REMERON) 30 MG tablet Take 1 tablet (30 mg total) by mouth at bedtime. 30 tablet 2  . Multiple Vitamin (MULTIVITAMIN WITH MINERALS) TABS tablet Take 1 tablet by mouth every morning.     . nitroGLYCERIN (NITROSTAT) 0.4 MG SL tablet PLACE 1 TABLET (0.4 MG TOTAL) UNDER THE TONGUE EVERY 5 (FIVE) MINUTES AS NEEDED. FOR CHEST PAIN (Patient taking differently: Place 0.4 mg under the tongue every 5 (five) minutes as needed for chest pain. ) 25 tablet 4  . olmesartan (BENICAR) 20 MG tablet Take 20 mg by mouth daily.    . Omega-3 Fatty Acids (FISH OIL) 1000 MG CAPS Take 1 capsule (1,000 mg total) by mouth 2 (two) times daily. 60 capsule 0  . prochlorperazine (COMPAZINE) 10 MG tablet Take 1 tablet (10 mg total) by mouth every 6 (six) hours as needed for nausea or vomiting. 30 tablet 0  . rosuvastatin (CRESTOR) 40 MG tablet TAKE 1 TABLET BY MOUTH EVERY DAY (Patient taking differently: Take 40 mg by mouth daily. ) 90 tablet 1  . tamsulosin (FLOMAX) 0.4 MG CAPS capsule Take 0.4 mg by mouth daily.    . traMADol (ULTRAM) 50 MG tablet Take 1-2 tablets (50-100 mg total) by mouth every 6 (six) hours as needed for moderate pain or severe pain. 30 tablet 0  . VENTOLIN HFA 108 (90 Base) MCG/ACT inhaler Inhale into the lungs.     [name] Leuprolide Acetate (ELIGARD [place]) Inject into the skin. (Patient not taking: Reported on 08/01/2020)     No current facility-administered medications for this encounter.    REVIEW OF SYSTEMS:  On review of systems, the patient reports that he is doing fairly well overall. He denies any chest pain, hemoptysis, fevers, chills, or night sweats. He continues with mild shortness of  breath and cough but this has improved since placement of his PleurX catheter which is draining appropriately.  He denies any bowel or bladder disturbances, and denies abdominal pain, nausea or vomiting. He continues with a very poor appetite and admits to poor oral intake but he has recently added protein smoothies into his diet for supplementation. He remains very weak and fatigued and reports dizziness when changing positions from seated to standing and from lying down to sitting upright but denies headaches, visual changes, tremor or focal weakness or paraesthesias. He denies any new, focal musculoskeletal or joint aches or pains. He reports pain at the [facility] joint in the left shoulder if he tries to lay on his left side at night but otherwise, this is not bothersome. He specifically denies pain in the cervical, thoracic or lumbar spine and denies pain in the sternum. He is not requiring any pain medications at this point. A complete review of systems is obtained and is otherwise negative.    PHYSICAL EXAM:  Wt Readings from Last 3 Encounters:  08/09/20 183 lb 2 oz (83.1 kg)  08/08/20 194 lb 1.6 oz (88 kg)  08/05/20 200 lb (90.7 kg)   Temp Readings from Last 3 Encounters:  08/09/20 98.7 F (37.1 C) (Oral)  08/08/20 98.2 F (36.8 C) (Tympanic)  08/05/20 (!) 97.5 F (36.4 C)   BP Readings from Last 3 Encounters:  08/09/20 (!) 87/59  08/09/20 (!) 116/58  08/08/20 110/61   Pulse Readings from Last 3 Encounters:  08/09/20 72  08/09/20 66  08/08/20 85   Pain Assessment Pain Score: 0-No pain/10  In general this is a chronically ill appearing caucasian male in no acute distress. He is alert and oriented x4 and appropriate throughout the examination. HEENT reveals that the patient is normocephalic, atraumatic. EOMs are intact. PERRLA. Skin is intact without any evidence of gross lesions. Cardiopulmonary assessment is negative for acute distress and he exhibits normal effort. The abdomen is  soft, non tender, non distended. Lower extremities are negative for pretibial pitting edema, deep calf tenderness, cyanosis or clubbing.  KPS = 50  100 - Normal; no complaints; no evidence of disease. 90   - Able to carry on normal activity; minor signs or symptoms of disease. 80   - Normal activity with effort; some signs or symptoms of disease. 2   - Cares for self; unable to carry on normal activity or to do active work. 60   - Requires occasional assistance, but is able to care for most of his personal needs. 50   - Requires considerable assistance and frequent medical care. 47   - Disabled; requires special care and assistance. 76   - Severely disabled; hospital admission is indicated although death not imminent. 21   - Very sick; hospital admission necessary; active supportive treatment necessary. 10   - Moribund; fatal processes progressing rapidly. 0     - Dead  Karnofsky DA, Abelmann [place], Craver LS and Burchenal JH [phone]) The use of the nitrogen mustards in the palliative treatment of carcinoma: with particular reference to bronchogenic carcinoma Cancer 1 634-56  LABORATORY DATA:  Lab Results  Component Value Date   WBC 5.2 08/08/2020   HGB 11.6 (L) 08/08/2020   HCT 36.9 (L) 08/08/2020   MCV 90.9 08/08/2020   PLT 176 08/08/2020   Lab Results  Component Value Date   NA 138 08/08/2020   K 4.1 08/08/2020   CL 100 08/08/2020   CO2 32 08/08/2020   Lab Results  Component Value Date   ALT 15 08/08/2020   AST 20 08/08/2020   ALKPHOS 95 08/08/2020   BILITOT 1.1 08/08/2020     RADIOGRAPHY: DG Chest 1 View  Result Date: 07/19/2020 CLINICAL DATA:  Post left thoracentesis EXAM: CHEST  1 VIEW COMPARISON:  07/11/2020 CT FINDINGS: Large left pleural effusion remains present. No pneumothorax following thoracentesis. Airspace disease throughout the left lung. Right lung clear. Heart is normal size. IMPRESSION: Large left effusion remains following thoracentesis. No pneumothorax.  Electronically Signed   By: Rolm Baptise M.D.   On: 07/19/2020 16:21   DG Chest 2 View  Result Date: 08/02/2020 CLINICAL DATA:  Preadmit for PleurX catheter placement. EXAM: CHEST - 2 VIEW COMPARISON:  Chest x-ray 07/31/2020 FINDINGS: Stable cardiomegaly. Stable left suprahilar fullness. Stable prominent left pleural effusion. Stable left pleural linear density. Lung markings are noted lateral this linear density suggesting that this linear density represent scarring. Right lung is clear. Thoracic spine scoliosis and degenerative change again noted. IMPRESSION: 1. Stable prominent left pleural effusion. Stable left pleural thickening. 2.  Stable left suprahilar prominence. 3.  Stable cardiomegaly. Electronically Signed   By: Marcello Moores  Register   On: 08/02/2020 12:50   DG Chest 2 View  Result Date: 07/31/2020 CLINICAL DATA:  Recurrent left pleural effusion. History of stage IV adenocarcinoma of the left lung. EXAM: CHEST - 2 VIEW COMPARISON:  Chest x-ray 07/19/2020 FINDINGS: Moderate to large left pleural effusion is noted approximately halfway up the thorax. Overlying atelectasis is again demonstrated. Stable fullness  in the left hilum and AP window may be residual tumor or radiation change. The right lung is grossly clear.  Remote rib fractures are noted. IMPRESSION: 1. Moderate to large left pleural effusion. 2. Stable left hilar and AP window fullness. Electronically Signed   By: Marijo Sanes M.D.   On: 07/31/2020 15:17   MR BRAIN W WO CONTRAST  Result Date: 07/26/2020 CLINICAL DATA:  Non-small cell lung carcinoma staging EXAM: MRI HEAD WITHOUT AND WITH CONTRAST TECHNIQUE: Multiplanar, multiecho pulse sequences of the brain and surrounding structures were obtained without and with intravenous contrast. CONTRAST:  13m GADAVIST GADOBUTROL 1 MMOL/ML IV SOLN COMPARISON:  None. FINDINGS: Brain: No acute infarct, acute hemorrhage or extra-axial collection. Normal white matter signal. Normal volume of CSF  spaces. No chronic microhemorrhage. Normal midline structures. There is no abnormal contrast enhancement. Vascular: Abnormal left internal carotid artery flow void. Skull and upper cervical spine: Normal marrow signal. Sinuses/Orbits: Negative. Other: None. IMPRESSION: 1. No intracranial metastatic disease. 2. Abnormal left internal carotid artery flow void, consistent with reduced flow or occlusion. Correlation with carotid ultrasound or CTA of the neck is recommended. These results will be called to the ordering clinician or representative by the Radiologist Assistant, and communication documented in the PACS or [employer] Electronically Signed   By: KUlyses JarredM.D.   On: 07/26/2020 03:33   NM PET Image Initial (PI) Skull Base To Thigh  Result Date: 07/16/2020 CLINICAL DATA:  Initial treatment strategy for lung mass with effusion. History of treated prostate carcinoma EXAM: NUCLEAR MEDICINE PET SKULL BASE TO THIGH TECHNIQUE: 11.2 mCi F-18 FDG was injected intravenously. Full-ring PET imaging was performed from the skull base to thigh after the radiotracer. CT data was obtained and used for attenuation correction and anatomic localization. Fasting blood glucose: 104 mg/dl COMPARISON:  CT 07/11/2020 FINDINGS: Mediastinal blood pool activity: SUV max 2.9 Liver activity: SUV max 3.3 NECK: No hypermetabolic lymph nodes in the neck. Incidental CT findings: none CHEST: Hypermetabolic activity in the LEFT hilum posteriorly which localizes to the central portion of the atelectatic LEFT lower lobe. No focal mass lesion identified however central ill-defined activity which is intense with SUV max equal 6.3. Hypermetabolic activity also associated centrally within the atelectatic LEFT upper lobe with SUV max equal 5.0. Ill-defined hypermetabolic tissue extends into the AP window seen best on coronal image 159. There is a complete collapse of the LEFT lower lobe. Partial collapse of the LEFT upper lobe. Large  LEFT pleural effusion. Mild metabolic activity associated with contralateral RIGHT lower paratracheal node. This node is difficult define on CT portion measuring approximately 1 cm image 75/4 and with moderate metabolic activity SUV max equal 4.2. Mild activity associated with small subcarinal node measuring 6 mm (80/4) with SUV max equal 3.2. No hypermetabolic supraclavicular adenopathy. Incidental CT findings: No nodularity in the RIGHT lung. ABDOMEN/PELVIS: No focal activity within the liver. No abnormal activity associated with the adrenal glands. No hypermetabolic abdominopelvic lymph nodes. Diffuse metabolic activity associated with the bowel is favored physiologic. No abnormal activity in the prostate gland. Incidental CT findings: Small gallstones noted. Bilateral benign-appearing low-density renal cysts. Atherosclerotic calcification of the aorta. Mild sigmoid colon diverticulosis. SKELETON: Multifocal hypermetabolic skeletal metastasis. Lytic lesion in the upper sternum measuring 2.1 cm has intense metabolic activity with SUV max equal 8.8. Multiple lesions the cervical, thoracic and lumbar spine. Example lesion in the T12 vertebral body extends into the RIGHT pedicle measuring 2.6 cm with SUV max equal 10.6. Small  lucent lesion with peripheral sclerosis measures 10 mm (image 118/series 4) at L1 with SUV max equal 7.0. Multiple bilateral hypermetabolic rib lesions. Probable pathologic fracture on posterior RIGHT rib ninth rib with SUV max equal 6.8. Small lesion anterior aspect of the LEFT second rib with SUV max equals 6.6. Lytic mildly expansile lesion at L4 posterior on the RIGHT measures 2.6 cm (image 64/4) does not have metabolic activity. There is a lucent lesion the central body of the LEFT scapula with SUV max equal 6.9. Lucent lesion within the angle of the LEFT mandible with SUV max equal 6.8. This small lesion measuring 6 mm. (Image 27/4) Incidental CT findings: none IMPRESSION: 1.  Hypermetabolic activity in the LEFT hilum involving the central portion of the atelectatic LEFT upper lobe and LEFT lower lobe. Ill-defined hypermetabolic tissue extends into the AP window. Findings are concerning for bronchogenic carcinoma. 2. Postobstructive collapse of the LEFT upper lobe and LEFT lower lobe. Large LEFT pleural effusion occupying 70% of the LEFT hemithorax. Recommend thoracentesis with cytology. 3. Mild metabolic activity of small mediastinal lymph nodes is indeterminate. 4. Multifocal hypermetabolic lytic skeletal metastasis. Differential would include lung cancer skeletal metastasis versus active multiple myeloma. Lytic lesions do not favor prostate cancer metastasis. Lesion in the mandible is typical location for multiple myeloma although given the LEFT lung findings bronchogenic carcinoma would be favored differential. Recommend tissue sampling (sternum potential site). These results will be called to the ordering clinician or representative by the Radiologist Assistant, and communication documented in the PACS or [employer]. Electronically Signed   By: Suzy Bouchard M.D.   On: 07/16/2020 13:13   DG C-Arm 1-60 Min-No Report  Result Date: 08/05/2020 Fluoroscopy was utilized by the requesting physician.  No radiographic interpretation.   VAS US CAROTID  Result Date: 07/15/2020 Carotid Arterial Duplex Study Indications:       Carotid artery disease, left endarterectomy and Left CE                    10/31/2018. Risk Factors:      Hypertension, no history of smoking, coronary artery disease. Comparison Study:  Prior duplex 12/25/2019 showed a 1-39% RICA stenosis and LICA                    occlusion. Performing Technologist: Delorise Shiner RVT  Examination Guidelines: A complete evaluation includes B-mode imaging, spectral Doppler, color Doppler, and power Doppler as needed of all accessible portions of each vessel. Bilateral testing is considered an integral part of a complete  examination. Limited examinations for reoccurring indications may be performed as noted.  Right Carotid Findings: +----------+--------+--------+--------+------------------------+--------+           PSV cm/sEDV cm/sStenosisPlaque Description      Comments +----------+--------+--------+--------+------------------------+--------+ CCA Prox  76      27              hypoechoic and smooth            +----------+--------+--------+--------+------------------------+--------+ CCA Mid   73      24                                               +----------+--------+--------+--------+------------------------+--------+ CCA Distal63      24                                               +----------+--------+--------+--------+------------------------+--------+  ICA Prox  39      14      1-39%   homogeneous and calcific         +----------+--------+--------+--------+------------------------+--------+ ICA Mid   64      27                                               +----------+--------+--------+--------+------------------------+--------+ ICA Distal60      22                                      tortuous +----------+--------+--------+--------+------------------------+--------+ ECA       54      13                                               +----------+--------+--------+--------+------------------------+--------+ +----------+--------+-------+----------------+-------------------+           PSV cm/sEDV cmsDescribe        Arm Pressure (mmHG) +----------+--------+-------+----------------+-------------------+ [id number]      0      Multiphasic, WNL                    +----------+--------+-------+----------------+-------------------+ +---------+--------+--+--------+--+---------+ VertebralPSV cm/s65EDV cm/s32Antegrade +---------+--------+--+--------+--+---------+  Left Carotid Findings: +----------+--------+--------+--------+----------------------+--------+            PSV cm/sEDV cm/sStenosisPlaque Description    Comments +----------+--------+--------+--------+----------------------+--------+ CCA Prox  67      8                                              +----------+--------+--------+--------+----------------------+--------+ CCA Mid   50      7                                              +----------+--------+--------+--------+----------------------+--------+ CCA Distal94      9       <50%    smooth and homogeneous         +----------+--------+--------+--------+----------------------+--------+ ICA Prox  0       0       Occludedcalcific                       +----------+--------+--------+--------+----------------------+--------+ ICA Mid   0       0       Occludedcalcific                       +----------+--------+--------+--------+----------------------+--------+ ICA Distal0       0       Occludedheterogenous                   +----------+--------+--------+--------+----------------------+--------+ ECA       59      12                                             +----------+--------+--------+--------+----------------------+--------+ +----------+--------+--------+----------------+-------------------+  PSV cm/sEDV cm/sDescribe        Arm Pressure (mmHG) +----------+--------+--------+----------------+-------------------+ Subclavian71      0       Multiphasic, WNL                    +----------+--------+--------+----------------+-------------------+ +---------+--------+--+--------+--+---------+ VertebralPSV cm/s50EDV cm/s19Antegrade +---------+--------+--+--------+--+---------+   Summary: Right Carotid: Velocities in the right ICA are consistent with a 1-39% stenosis. 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               Non-hemodynamically significant plaque <50% noted in the CCA. Vertebrals:  Bilateral vertebral arteries demonstrate antegrade flow. Subclavians:  Normal flow hemodynamics were seen in bilateral subclavian              arteries. *See table(s) above for measurements and observations.  Electronically signed by Harold Barban MD on 07/15/2020 at 3:50:16 PM.    Final    US THORACENTESIS ASP PLEURAL SPACE W/IMG GUIDE  Result Date: 07/19/2020 INDICATION: Patient with prior history of prostate cancer; now with left lung mass, bone lesions, left pleural effusion. Request received for diagnostic and therapeutic left thoracentesis. EXAM: ULTRASOUND GUIDED DIAGNOSTIC AND THERAPEUTIC LEFT THORACENTESIS MEDICATIONS: 1% lidocaine to skin and subcutaneous tissue COMPLICATIONS: None immediate. PROCEDURE: An ultrasound guided thoracentesis was thoroughly discussed with the patient and questions answered. The benefits, risks, alternatives and complications were also discussed. The patient understands and wishes to proceed with the procedure. Written consent was obtained. Ultrasound was performed to localize and mark an adequate pocket of fluid in the left chest. The area was then prepped and draped in the normal sterile fashion. 1% Lidocaine was used for local anesthesia. Under ultrasound guidance a 6 Fr Safe-T-Centesis catheter was introduced. Thoracentesis was performed. The catheter was removed and a dressing applied. FINDINGS: A total of approximately 2.5 liters of blood-tinged fluid was removed. Samples were sent to the laboratory as requested by the clinical team. Due to patient coughing only the above amount of fluid was removed today. IMPRESSION: Successful ultrasound guided diagnostic and therapeutic left thoracentesis yielding 2.5 liters of pleural fluid. Read by: Rowe Robert, PA-C Electronically Signed   By: Corrie Mckusick D.O.   On: 07/19/2020 16:00      IMPRESSION/PLAN: 1. 74 y.o. 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  Today, we talked to the patient and family about the findings and workup thus  far. We reviewed the PET imaging and pointed out the metastatic involvement in the skeleton, particularly in the cervical spine, that could become problematic in the future and encouraged them to let us know immediately should he develop any persistent pain in the neck or elsewhere. Currently, he denies any focal pain warranting treatment. We discussed the natural history of metastatic lung carcinoma and general treatment, highlighting the role of palliative radiotherapy in the management of painful skeletal metastases. We discussed the available radiation techniques, and focused on the details and logistics of delivery. We reviewed the anticipated acute and late sequelae associated with radiation in this setting but advised that we would recommend reserving palliative radiation for the future should he develop any focally painful sites of metastases. The patient and his wife were encouraged to ask questions that were answered to their satisfaction. He will continue in routine follow up with Dr. Julien Nordmann and we will stay in close correspondence, happy to continue to participate in his care if clinically indicated in the future. They appear  to have a good understanding of his disease and are comfortable and in agreement with the stated plan.  2. Orthostatic hypotension- the patient complains of dizziness with change in position from lying down to sitting upright and from sitting to standing.  Orthostatic hypotension was confirmed today in the office so we have recommended that he present to the symptom management clinic in medical oncology for fluid replacement today. We also encouraged that he increase his oral intake at home and be particularly mindful of getting adequate fluid hydration to maintain his blood pressure and strength and advised that he hold his blood pressure medications and Flomax for BP readings at home less than 70/100. Unfortunately, he had already taken these medications before presenting to  the office this morning.   Nicholos Johns, PA-C    Tyler Pita, MD  [facility] Direct Dial: [phone]  Fax: [phone] [email or web address]  Skype  [email or web address]   This document serves as a record of services personally performed by Tyler Pita, MD and Freeman Caldron, PA-C. It was created on their behalf by Wilburn Mylar, a trained medical scribe. The creation of this record is based on the scribe's personal observations and the provider's statements to them. This document has been checked and approved by the attending provider.

## 2020-08-09 NOTE — Telephone Encounter (Signed)
Scheduled appointments per 9/16 los. Spoke with patient yesterday upon check out. Patient is aware of appointments.

## 2020-08-09 NOTE — Telephone Encounter (Signed)
Charles Robinson is returning Alisha's call. Please advise.

## 2020-08-10 NOTE — Telephone Encounter (Signed)
Lets have him STOP Olmesartan (his ARB) for now -- Until he starts feeling better.   Usually I would agree with the plan that [facility] recommended,  but these drops in BP are too sporadic.   He was recently put back on ARB for elevated BP.  At this point I think permissive hypertension allowing systolic blood pressures as high as 160s and 170s is reasonable.  Glenetta Hew, MD

## 2020-08-12 ENCOUNTER — Telehealth: Payer: Self-pay | Admitting: *Deleted

## 2020-08-12 ENCOUNTER — Inpatient Hospital Stay: Payer: Medicare Other | Admitting: Internal Medicine

## 2020-08-12 ENCOUNTER — Encounter: Payer: Self-pay | Admitting: *Deleted

## 2020-08-12 ENCOUNTER — Encounter: Payer: Self-pay | Admitting: Internal Medicine

## 2020-08-12 ENCOUNTER — Inpatient Hospital Stay: Payer: Medicare Other

## 2020-08-12 ENCOUNTER — Inpatient Hospital Stay (HOSPITAL_BASED_OUTPATIENT_CLINIC_OR_DEPARTMENT_OTHER): Payer: Medicare Other | Admitting: Internal Medicine

## 2020-08-12 ENCOUNTER — Other Ambulatory Visit (HOSPITAL_COMMUNITY)
Admission: RE | Admit: 2020-08-12 | Discharge: 2020-08-12 | Disposition: A | Discharge status: Home / Self Care | Payer: Medicare Other | Source: Ambulatory Visit | Attending: Internal Medicine | Admitting: Internal Medicine

## 2020-08-12 ENCOUNTER — Telehealth: Payer: Self-pay | Admitting: Internal Medicine

## 2020-08-12 ENCOUNTER — Other Ambulatory Visit: Payer: Self-pay

## 2020-08-12 ENCOUNTER — Other Ambulatory Visit: Payer: Self-pay | Admitting: Medical Oncology

## 2020-08-12 VITALS — BP 102/60 | HR 59 | Temp 97.3°F | Resp 17 | Ht 75.0 in | Wt 194.0 lb

## 2020-08-12 DIAGNOSIS — C3492 Malignant neoplasm of unspecified part of left bronchus or lung: Secondary | ICD-10-CM

## 2020-08-12 DIAGNOSIS — I6523 Occlusion and stenosis of bilateral carotid arteries: Secondary | ICD-10-CM

## 2020-08-12 DIAGNOSIS — Z5112 Encounter for antineoplastic immunotherapy: Secondary | ICD-10-CM

## 2020-08-12 DIAGNOSIS — I10 Essential (primary) hypertension: Secondary | ICD-10-CM

## 2020-08-12 DIAGNOSIS — R5382 Chronic fatigue, unspecified: Secondary | ICD-10-CM

## 2020-08-12 DIAGNOSIS — C61 Malignant neoplasm of prostate: Secondary | ICD-10-CM

## 2020-08-12 DIAGNOSIS — Z5111 Encounter for antineoplastic chemotherapy: Secondary | ICD-10-CM

## 2020-08-12 DIAGNOSIS — C7951 Secondary malignant neoplasm of bone: Secondary | ICD-10-CM | POA: Insufficient documentation

## 2020-08-12 LAB — CBC WITH DIFFERENTIAL (CANCER CENTER ONLY)
Abs Immature Granulocytes: 0.01 10*3/uL (ref 0.00–0.07)
Basophils Absolute: 0 10*3/uL (ref 0.0–0.1)
Basophils Relative: 0 %
Eosinophils Absolute: 0.1 10*3/uL (ref 0.0–0.5)
Eosinophils Relative: 2 %
HCT: 35.5 % — ABNORMAL LOW (ref 39.0–52.0)
Hemoglobin: 11.3 g/dL — ABNORMAL LOW (ref 13.0–17.0)
Immature Granulocytes: 0 %
Lymphocytes Relative: 9 %
Lymphs Abs: 0.3 10*3/uL — ABNORMAL LOW (ref 0.7–4.0)
MCH: 28.6 pg (ref 26.0–34.0)
MCHC: 31.8 g/dL (ref 30.0–36.0)
MCV: 89.9 fL (ref 80.0–100.0)
Monocytes Absolute: 0.3 10*3/uL (ref 0.1–1.0)
Monocytes Relative: 10 %
Neutro Abs: 2.6 10*3/uL (ref 1.7–7.7)
Neutrophils Relative %: 79 %
Platelet Count: 204 10*3/uL (ref 150–400)
RBC: 3.95 MIL/uL — ABNORMAL LOW (ref 4.22–5.81)
RDW: 13.6 % (ref 11.5–15.5)
WBC Count: 3.3 10*3/uL — ABNORMAL LOW (ref 4.0–10.5)
nRBC: 0 % (ref 0.0–0.2)

## 2020-08-12 LAB — CMP (CANCER CENTER ONLY)
ALT: 31 U/L (ref 0–44)
AST: 34 U/L (ref 15–41)
Albumin: 2.4 g/dL — ABNORMAL LOW (ref 3.5–5.0)
Alkaline Phosphatase: 89 U/L (ref 38–126)
Anion gap: 6 (ref 5–15)
BUN: 22 mg/dL (ref 8–23)
CO2: 32 mmol/L (ref 22–32)
Calcium: 8.9 mg/dL (ref 8.9–10.3)
Chloride: 104 mmol/L (ref 98–111)
Creatinine: 1.24 mg/dL (ref 0.61–1.24)
GFR, Est AFR Am: >60 mL/min (ref >60)
GFR, Estimated: 56 mL/min — ABNORMAL LOW (ref >60)
Glucose, Bld: 100 mg/dL — ABNORMAL HIGH (ref 70–99)
Potassium: 3.6 mmol/L (ref 3.5–5.1)
Sodium: 142 mmol/L (ref 135–145)
Total Bilirubin: 1 mg/dL (ref 0.3–1.2)
Total Protein: 6.1 g/dL — ABNORMAL LOW (ref 6.5–8.1)

## 2020-08-12 LAB — TSH: TSH: 1.56 u[IU]/mL (ref 0.320–4.118)

## 2020-08-12 NOTE — Progress Notes (Signed)
I spoke with Mr and Ms. Femia today at clinic.  Mr. Cowger drained his pleural fluid this am.  I took this to pathology to be analyzed and sent to [facility] for molecular testing and PDL 1.

## 2020-08-12 NOTE — Telephone Encounter (Signed)
Cancelled appts on 9/20 and f/u appt on 9/23 per MD sch msg. Called and left msg for patient.

## 2020-08-12 NOTE — Telephone Encounter (Signed)
I received a message from Ms. Scott questioning his appt today.  I spoke to Dr. Julien Nordmann and he will see patient today.  I called back and update Mr. Zurn.  He verbalized understanding.

## 2020-08-12 NOTE — Progress Notes (Signed)
[facility] [phone]   [phone]  OFFICE PROGRESS NOTE  [name], MD [place] [address]  DIAGNOSIS: Stage IV (T3, N2, M1c) non-small cell lung cancer, adenocarcinoma presented with left upper lobe lung mass with occlusion of the left upper lobe bronchus and near complete left lower lobe atelectasis in addition to mediastinal lymphadenopathy in the AP window and suspicious malignant left pleural effusion and multifocal bone metastasis diagnosed in August 2021.  BIOMARKER(S) % CFDNA OR AMPLIFICATION ASSOCIATED FDA-APPROVED THERAPIES CLINICAL TRIAL AVAILABILITY [id number]* 0.2% None Yes  [id number] 0.2% None Yes  PRIOR THERAPY:  Status post left Pleurx placement for drainage of recurrent pleural effusion under the care of Dr. Roxan Hockey on 08/05/2020.  CURRENT THERAPY:  Systemic chemotherapy with carboplatin for AUC of 5, Alimta 500 mg/M2 and Keytruda 200 mg IV every 3 weeks.  First dose 08/15/2020.  INTERVAL HISTORY: Charles Robinson 74 y.o. male returns to the clinic today for follow-up visit accompanied by his wife.  The patient is feeling fine today except for the fatigue and generalized weakness in addition to shortness of breath with exertion.  He denied having any chest pain but has mild cough with no hemoptysis.  He lost few pounds since his last visit.  He denied having any nausea, vomiting, diarrhea or constipation.  He denied having any headache or visual changes.  He collected the drainage of the left pleural effusion and brought it to the office to be sent for cytologic evaluation and the molecular studies.  MEDICAL HISTORY: Past Medical History:  Diagnosis Date  . Arthritis    back  . BPH (benign prostatic hypertrophy)   . CAD S/P multivessel PCI: LAD, RCA and extensive LCX-OM1 03/15/2012   s/p PCI to LAD, to RCA (now occluded), then extensive PCI to Dominant LCx-OM1  (enitre prox-AVG Circ for dissection in  07/2011)  . Cancer ([place])    SKIN CA  . Dyspnea   . Dysrhythmia    afib  . GERD (gastroesophageal reflux disease)   . Heart murmur   . History of colon polyps   . History of echocardiogram 08/16/2011   Echo - EF 60-65%; moderate LV concentric hypertrophy; abdnormal LV relaxation (grade 1 diastolic dysfunction; ascending aorta mildly dilated; LA moderately dilated;   . Hyperlipidemia   . Hypertension   . 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 PAF (paroxysmal atrial fibrillation) ([place]) 12/20/2012   followed by Dr. Glenetta Hew; s/p DCCV 2011, 07/2011 post PCI with Type 4a MI; CHA2DS2Vasc = 3, on Warfarin  . Paroxysmal atrial flutter ([place]) 08/20/2011   TEE- atrial septum - no defect identified; RA normal in size, no evidence of thrombus; ascending aorta normal  . Prostate cancer ([place])   . Rupture quadriceps tendon     ALLERGIES:  is allergic to lipitor [atorvastatin], morphine and related, and vicodin [hydrocodone-acetaminophen].  MEDICATIONS:  Current Outpatient Medications  Medication Sig Dispense Refill  . acetaminophen (TYLENOL) 325 MG tablet Take 650 mg by mouth every 6 (six) hours as needed for moderate pain.    [name] amiodarone (PACERONE) 200 MG tablet Take 1 tablet (200 mg total) by mouth daily.    . Coenzyme Q10 200 MG capsule Take 200 mg by mouth every morning.     [name] ELIQUIS 5 MG TABS tablet TAKE 1 TABLET BY MOUTH TWICE A DAY (Patient taking differently: Take 5 mg by  mouth 2 (two) times daily. ) 180 tablet 2  . ezetimibe (ZETIA) 10 MG tablet Take 1 tablet (10 mg total) by mouth daily. 90 tablet 2  . finasteride (PROSCAR) 5 MG tablet Take 5 mg by mouth every evening.     . folic acid (FOLVITE) 1 MG tablet Take 1 tablet (1 mg total) by mouth daily. 30 tablet 4  . hydroxypropyl methylcellulose / hypromellose (ISOPTO TEARS / GONIOVISC) 2.5 % ophthalmic solution Place 1 drop into both eyes 3 (three)  times daily as needed for dry eyes.    [name] Leuprolide Acetate (ELIGARD [place]) Inject into the skin. (Patient not taking: Reported on 08/01/2020)    . metoprolol tartrate (LOPRESSOR) 25 MG tablet Take 1 tablet (25 mg total) by mouth 2 (two) times daily. 180 tablet 3  . mirtazapine (REMERON) 30 MG tablet Take 1 tablet (30 mg total) by mouth at bedtime. 30 tablet 2  . Multiple Vitamin (MULTIVITAMIN WITH MINERALS) TABS tablet Take 1 tablet by mouth every morning.     . nitroGLYCERIN (NITROSTAT) 0.4 MG SL tablet PLACE 1 TABLET (0.4 MG TOTAL) UNDER THE TONGUE EVERY 5 (FIVE) MINUTES AS NEEDED. FOR CHEST PAIN (Patient taking differently: Place 0.4 mg under the tongue every 5 (five) minutes as needed for chest pain. ) 25 tablet 4  . olmesartan (BENICAR) 20 MG tablet Take 20 mg by mouth daily.    . Omega-3 Fatty Acids (FISH OIL) 1000 MG CAPS Take 1 capsule (1,000 mg total) by mouth 2 (two) times daily. 60 capsule 0  . prochlorperazine (COMPAZINE) 10 MG tablet Take 1 tablet (10 mg total) by mouth every 6 (six) hours as needed for nausea or vomiting. 30 tablet 0  . rosuvastatin (CRESTOR) 40 MG tablet TAKE 1 TABLET BY MOUTH EVERY DAY (Patient taking differently: Take 40 mg by mouth daily. ) 90 tablet 1  . tamsulosin (FLOMAX) 0.4 MG CAPS capsule Take 0.4 mg by mouth daily.    . traMADol (ULTRAM) 50 MG tablet Take 1-2 tablets (50-100 mg total) by mouth every 6 (six) hours as needed for moderate pain or severe pain. 30 tablet 0  . VENTOLIN HFA 108 (90 Base) MCG/ACT inhaler Inhale into the lungs.      No current facility-administered medications for this visit.    SURGICAL HISTORY:  Past Surgical History:  Procedure Laterality Date  . BACK SURGERY    . CHEST TUBE INSERTION Left 08/05/2020   Procedure: INSERTION LEFT PLEURAL DRAINAGE CATHETER;  Surgeon: Melrose Nakayama, MD;  Location: [facility];  Service: Thoracic;  Laterality: Left;  . COLONOSCOPY    . CORONARY ANGIOPLASTY WITH STENT PLACEMENT  03/15/2012    multiple stens in AVGroove Circ & OM1;; 2 site PTCA w/ PTCA and stenting of OM2 ang PTCA of distal stent ISR followed by PTCA of mid LCx ISR  . CORONARY STENT PLACEMENT  2003 - 2011 - 2012 - 2013   Pre-2012 - BMS in RCA now known occluded, BMS in proximal LAD; 07/2011 - 2.25 x 23 BMS in OM 1 with significant disease on either side noted shortly after -->  2 additional overlapping DES 2.5 mm x 30 mm and 2.25 mm x 26 mm in OM1,; 3 overlapping Resolute DES in AV groove Cx crossing OM1: (Tapered from 3.8-2.6 mm)- Resolute DES 3.5 x 22, 3.0 x 38, 2.5 x 14  . DOPPLER ECHOCARDIOGRAPHY  07/2011   EF ~60-65%, Grade 1 D Dysfxn; mod Conc LVH  . ENDARTERECTOMY Left 02/27/2016  Procedure: LEFT  CAROTID ARTERY ENDARTERECTOMY ;  Surgeon: Serafina Mitchell, MD;  Location: [facility];  Service: Vascular;  Laterality: Left;  . LEFT HEART CATHETERIZATION WITH CORONARY ANGIOGRAM N/A 03/15/2012   Procedure: LEFT HEART CATHETERIZATION WITH CORONARY ANGIOGRAM;  Surgeon: [name], MD;  Location[facility] CATH LAB: patent LAD stents, patent LCx stents w/80% focal ISR just distal to OM; OM proximal stent open; -- 2 site PTCA-PCI  . LUMBAR LAMINECTOMY/DECOMPRESSION MICRODISCECTOMY Bilateral 12/11/2014   Procedure: Bilateral Lumbar Three-Four Laminectomy;  Surgeon: Kristeen Miss, MD;  Location: [place] [facility] [place] [facility]URO [facility]S;  Service: Neurosurgery;  Laterality: Bilateral;  bilateral  . NM MYOVIEW LTD  11/2012   No ischemia or infarction, normal EF & WM  . PATCH ANGIOPLASTY Left 02/27/2016   Procedure: WITH HEMASHIELD DACRON  PATCH ANGIOPLASTY;  Surgeon: Serafina Mitchell, MD;  Location: [facility];  Service: Vascular;  Laterality: Left;  [name] QUADRICEPS TENDON REPAIR  09/19/2012   Procedure: REPAIR QUADRICEP TENDON;  Surgeon: [name], MD;  Location: [facility];  Service: Orthopedics;  Laterality: Left;  [name] VASECTOMY      REVIEW OF SYSTEMS:  A comprehensive review of systems was negative except for: Constitutional: positive for fatigue and weight  loss Respiratory: positive for dyspnea on exertion Musculoskeletal: positive for muscle weakness   PHYSICAL EXAMINATION: General appearance: alert, cooperative, fatigued and no distress Head: Normocephalic, without obvious abnormality, atraumatic Neck: no adenopathy, no JVD, supple, symmetrical, trachea midline and thyroid not enlarged, symmetric, no tenderness/mass/nodules Lymph nodes: Cervical, supraclavicular, and axillary nodes normal. Resp: clear to auscultation bilaterally Back: symmetric, no curvature. ROM normal. No CVA tenderness. Cardio: regular rate and rhythm, S1, S2 normal, no murmur, click, rub or gallop GI: soft, non-tender; bowel sounds normal; no masses,  no organomegaly Extremities: extremities normal, atraumatic, no cyanosis or edema  ECOG PERFORMANCE STATUS: 1 - Symptomatic but completely ambulatory  Blood pressure 102/60, pulse (!) 59, temperature (!) 97.3 F (36.3 C), temperature source Tympanic, resp. rate 17, height _0  (1.905 m), weight 194 lb (88 kg), SpO2 100 %.  LABORATORY DATA: Lab Results  Component Value Date   WBC 3.3 (L) 08/12/2020   HGB 11.3 (L) 08/12/2020   HCT 35.5 (L) 08/12/2020   MCV 89.9 08/12/2020   PLT 204 08/12/2020      Chemistry      Component Value Date/Time   NA 138 08/08/2020 1309   NA 140 08/15/2019 0936   K 4.1 08/08/2020 1309   CL 100 08/08/2020 1309   CO2 32 08/08/2020 1309   BUN 22 08/08/2020 1309   BUN 27 08/15/2019 0936   CREATININE 1.21 08/08/2020 1309   CREATININE 1.43 (H) 07/29/2016 0814      Component Value Date/Time   CALCIUM 8.8 (L) 08/08/2020 1309   ALKPHOS 95 08/08/2020 1309   AST 20 08/08/2020 1309   ALT 15 08/08/2020 1309   BILITOT 1.1 08/08/2020 1309       RADIOGRAPHIC STUDIES: DG Chest 1 View  Result Date: 07/19/2020 CLINICAL DATA:  Post left thoracentesis EXAM: CHEST  1 VIEW COMPARISON:  07/11/2020 CT FINDINGS: Large left pleural effusion remains present. No pneumothorax following  thoracentesis. Airspace disease throughout the left lung. Right lung clear. Heart is normal size. IMPRESSION: Large left effusion remains following thoracentesis. No pneumothorax. Electronically Signed   By: Rolm Baptise M.D.   On: 07/19/2020 16:21   DG Chest 2 View  Result Date: 08/02/2020 CLINICAL DATA:  Preadmit for PleurX catheter placement. EXAM: CHEST - 2  VIEW COMPARISON:  Chest x-ray 07/31/2020 FINDINGS: Stable cardiomegaly. Stable left suprahilar fullness. Stable prominent left pleural effusion. Stable left pleural linear density. Lung markings are noted lateral this linear density suggesting that this linear density represent scarring. Right lung is clear. Thoracic spine scoliosis and degenerative change again noted. IMPRESSION: 1. Stable prominent left pleural effusion. Stable left pleural thickening. 2.  Stable left suprahilar prominence. 3.  Stable cardiomegaly. Electronically Signed   By: Marcello Moores  Register   On: 08/02/2020 12:50   DG Chest 2 View  Result Date: 07/31/2020 CLINICAL DATA:  Recurrent left pleural effusion. History of stage IV adenocarcinoma of the left lung. EXAM: CHEST - 2 VIEW COMPARISON:  Chest x-ray 07/19/2020 FINDINGS: Moderate to large left pleural effusion is noted approximately halfway up the thorax. Overlying atelectasis is again demonstrated. Stable fullness in the left hilum and AP window may be residual tumor or radiation change. The right lung is grossly clear.  Remote rib fractures are noted. IMPRESSION: 1. Moderate to large left pleural effusion. 2. Stable left hilar and AP window fullness. Electronically Signed   By: Marijo Sanes M.D.   On: 07/31/2020 15:17   MR BRAIN W WO CONTRAST  Result Date: 07/26/2020 CLINICAL DATA:  Non-small cell lung carcinoma staging EXAM: MRI HEAD WITHOUT AND WITH CONTRAST TECHNIQUE: Multiplanar, multiecho pulse sequences of the brain and surrounding structures were obtained without and with intravenous contrast. CONTRAST:  48m GADAVIST  GADOBUTROL 1 MMOL/ML IV SOLN COMPARISON:  None. FINDINGS: Brain: No acute infarct, acute hemorrhage or extra-axial collection. Normal white matter signal. Normal volume of CSF spaces. No chronic microhemorrhage. Normal midline structures. There is no abnormal contrast enhancement. Vascular: Abnormal left internal carotid artery flow void. Skull and upper cervical spine: Normal marrow signal. Sinuses/Orbits: Negative. Other: None. IMPRESSION: 1. No intracranial metastatic disease. 2. Abnormal left internal carotid artery flow void, consistent with reduced flow or occlusion. Correlation with carotid ultrasound or CTA of the neck is recommended. These results will be called to the ordering clinician or representative by the Radiologist Assistant, and communication documented in the PACS or [employer] Electronically Signed   By: KUlyses JarredM.D.   On: 07/26/2020 03:33   NM PET Image Initial (PI) Skull Base To Thigh  Result Date: 07/16/2020 CLINICAL DATA:  Initial treatment strategy for lung mass with effusion. History of treated prostate carcinoma EXAM: NUCLEAR MEDICINE PET SKULL BASE TO THIGH TECHNIQUE: 11.2 mCi F-18 FDG was injected intravenously. Full-ring PET imaging was performed from the skull base to thigh after the radiotracer. CT data was obtained and used for attenuation correction and anatomic localization. Fasting blood glucose: 104 mg/dl COMPARISON:  CT 07/11/2020 FINDINGS: Mediastinal blood pool activity: SUV max 2.9 Liver activity: SUV max 3.3 NECK: No hypermetabolic lymph nodes in the neck. Incidental CT findings: none CHEST: Hypermetabolic activity in the LEFT hilum posteriorly which localizes to the central portion of the atelectatic LEFT lower lobe. No focal mass lesion identified however central ill-defined activity which is intense with SUV max equal 6.3. Hypermetabolic activity also associated centrally within the atelectatic LEFT upper lobe with SUV max equal 5.0. Ill-defined  hypermetabolic tissue extends into the AP window seen best on coronal image 159. There is a complete collapse of the LEFT lower lobe. Partial collapse of the LEFT upper lobe. Large LEFT pleural effusion. Mild metabolic activity associated with contralateral RIGHT lower paratracheal node. This node is difficult define on CT portion measuring approximately 1 cm image 75/4 and with moderate metabolic activity SUV  max equal 4.2. Mild activity associated with small subcarinal node measuring 6 mm (80/4) with SUV max equal 3.2. No hypermetabolic supraclavicular adenopathy. Incidental CT findings: No nodularity in the RIGHT lung. ABDOMEN/PELVIS: No focal activity within the liver. No abnormal activity associated with the adrenal glands. No hypermetabolic abdominopelvic lymph nodes. Diffuse metabolic activity associated with the bowel is favored physiologic. No abnormal activity in the prostate gland. Incidental CT findings: Small gallstones noted. Bilateral benign-appearing low-density renal cysts. Atherosclerotic calcification of the aorta. Mild sigmoid colon diverticulosis. SKELETON: Multifocal hypermetabolic skeletal metastasis. Lytic lesion in the upper sternum measuring 2.1 cm has intense metabolic activity with SUV max equal 8.8. Multiple lesions the cervical, thoracic and lumbar spine. Example lesion in the T12 vertebral body extends into the RIGHT pedicle measuring 2.6 cm with SUV max equal 10.6. Small lucent lesion with peripheral sclerosis measures 10 mm (image 118/series 4) at L1 with SUV max equal 7.0. Multiple bilateral hypermetabolic rib lesions. Probable pathologic fracture on posterior RIGHT rib ninth rib with SUV max equal 6.8. Small lesion anterior aspect of the LEFT second rib with SUV max equals 6.6. Lytic mildly expansile lesion at L4 posterior on the RIGHT measures 2.6 cm (image 64/4) does not have metabolic activity. There is a lucent lesion the central body of the LEFT scapula with SUV max equal 6.9.  Lucent lesion within the angle of the LEFT mandible with SUV max equal 6.8. This small lesion measuring 6 mm. (Image 27/4) Incidental CT findings: none IMPRESSION: 1. Hypermetabolic activity in the LEFT hilum involving the central portion of the atelectatic LEFT upper lobe and LEFT lower lobe. Ill-defined hypermetabolic tissue extends into the AP window. Findings are concerning for bronchogenic carcinoma. 2. Postobstructive collapse of the LEFT upper lobe and LEFT lower lobe. Large LEFT pleural effusion occupying 70% of the LEFT hemithorax. Recommend thoracentesis with cytology. 3. Mild metabolic activity of small mediastinal lymph nodes is indeterminate. 4. Multifocal hypermetabolic lytic skeletal metastasis. Differential would include lung cancer skeletal metastasis versus active multiple myeloma. Lytic lesions do not favor prostate cancer metastasis. Lesion in the mandible is typical location for multiple myeloma although given the LEFT lung findings bronchogenic carcinoma would be favored differential. Recommend tissue sampling (sternum potential site). These results will be called to the ordering clinician or representative by the Radiologist Assistant, and communication documented in the PACS or [employer]. Electronically Signed   By: Suzy Bouchard M.D.   On: 07/16/2020 13:13   DG C-Arm 1-60 Min-No Report  Result Date: 08/05/2020 Fluoroscopy was utilized by the requesting physician.  No radiographic interpretation.   VAS US CAROTID  Result Date: 07/15/2020 Carotid Arterial Duplex Study Indications:       Carotid artery disease, left endarterectomy and Left CE                    10/31/2018. Risk Factors:      Hypertension, no history of smoking, coronary artery disease. Comparison Study:  Prior duplex 12/25/2019 showed a 1-39% RICA stenosis and LICA                    occlusion. Performing Technologist: Delorise Shiner RVT  Examination Guidelines: A complete evaluation includes B-mode imaging,  spectral Doppler, color Doppler, and power Doppler as needed of all accessible portions of each vessel. Bilateral testing is considered an integral part of a complete examination. Limited examinations for reoccurring indications may be performed as noted.  Right Carotid Findings: +----------+--------+--------+--------+------------------------+--------+  PSV cm/sEDV cm/sStenosisPlaque Description      Comments +----------+--------+--------+--------+------------------------+--------+ CCA Prox  76      27              hypoechoic and smooth            +----------+--------+--------+--------+------------------------+--------+ CCA Mid   73      24                                               +----------+--------+--------+--------+------------------------+--------+ CCA Distal63      24                                               +----------+--------+--------+--------+------------------------+--------+ ICA Prox  39      14      1-39%   homogeneous and calcific         +----------+--------+--------+--------+------------------------+--------+ ICA Mid   64      27                                               +----------+--------+--------+--------+------------------------+--------+ ICA Distal60      22                                      tortuous +----------+--------+--------+--------+------------------------+--------+ ECA       54      13                                               +----------+--------+--------+--------+------------------------+--------+ +----------+--------+-------+----------------+-------------------+           PSV cm/sEDV cmsDescribe        Arm Pressure (mmHG) +----------+--------+-------+----------------+-------------------+ [id number]      0      Multiphasic, WNL                    +----------+--------+-------+----------------+-------------------+ +---------+--------+--+--------+--+---------+ VertebralPSV cm/s65EDV  cm/s32Antegrade +---------+--------+--+--------+--+---------+  Left Carotid Findings: +----------+--------+--------+--------+----------------------+--------+           PSV cm/sEDV cm/sStenosisPlaque Description    Comments +----------+--------+--------+--------+----------------------+--------+ CCA Prox  67      8                                              +----------+--------+--------+--------+----------------------+--------+ CCA Mid   50      7                                              +----------+--------+--------+--------+----------------------+--------+ CCA Distal94      9       <50%    smooth and homogeneous         +----------+--------+--------+--------+----------------------+--------+ ICA Prox  0  0       Occludedcalcific                       +----------+--------+--------+--------+----------------------+--------+ ICA Mid   0       0       Occludedcalcific                       +----------+--------+--------+--------+----------------------+--------+ ICA Distal0       0       Occludedheterogenous                   +----------+--------+--------+--------+----------------------+--------+ ECA       59      12                                             +----------+--------+--------+--------+----------------------+--------+ +----------+--------+--------+----------------+-------------------+           PSV cm/sEDV cm/sDescribe        Arm Pressure (mmHG) +----------+--------+--------+----------------+-------------------+ [id number]      0       Multiphasic, WNL                    +----------+--------+--------+----------------+-------------------+ +---------+--------+--+--------+--+---------+ VertebralPSV cm/s50EDV cm/s19Antegrade +---------+--------+--+--------+--+---------+   Summary: Right Carotid: Velocities in the right ICA are consistent with a 1-39% stenosis. Left Carotid: Evidence consistent with a total occlusion of the  left ICA.               Non-hemodynamically significant plaque <50% noted in the CCA. Vertebrals:  Bilateral vertebral arteries demonstrate antegrade flow. Subclavians: Normal flow hemodynamics were seen in bilateral subclavian              arteries. *See table(s) above for measurements and observations.  Electronically signed by Harold Barban MD on 07/15/2020 at 3:50:16 PM.    Final    US THORACENTESIS ASP PLEURAL SPACE W/IMG GUIDE  Result Date: 07/19/2020 INDICATION: Patient with prior history of prostate cancer; now with left lung mass, bone lesions, left pleural effusion. Request received for diagnostic and therapeutic left thoracentesis. EXAM: ULTRASOUND GUIDED DIAGNOSTIC AND THERAPEUTIC LEFT THORACENTESIS MEDICATIONS: 1% lidocaine to skin and subcutaneous tissue COMPLICATIONS: None immediate. PROCEDURE: An ultrasound guided thoracentesis was thoroughly discussed with the patient and questions answered. The benefits, risks, alternatives and complications were also discussed. The patient understands and wishes to proceed with the procedure. Written consent was obtained. Ultrasound was performed to localize and mark an adequate pocket of fluid in the left chest. The area was then prepped and draped in the normal sterile fashion. 1% Lidocaine was used for local anesthesia. Under ultrasound guidance a 6 Fr Safe-T-Centesis catheter was introduced. Thoracentesis was performed. The catheter was removed and a dressing applied. FINDINGS: A total of approximately 2.5 liters of blood-tinged fluid was removed. Samples were sent to the laboratory as requested by the clinical team. Due to patient coughing only the above amount of fluid was removed today. IMPRESSION: Successful ultrasound guided diagnostic and therapeutic left thoracentesis yielding 2.5 liters of pleural fluid. Read by: Rowe Robert, PA-C Electronically Signed   By: Corrie Mckusick D.O.   On: 07/19/2020 16:00    ASSESSMENT AND PLAN: This is a very  pleasant 74 years old white male recently diagnosed with a stage IV (T3, N2, M1 C) non-small cell lung cancer, adenocarcinoma presented with left upper lobe lung mass with  occlusion of the left upper lobe bronchus as well as noted complete left lower lobe atelectasis in addition to mediastinal lymphadenopathy in the AP window as well as malignant left pleural effusion and bone metastasis diagnosed in August 2021. The molecular studies by [employer] showed no actionable mutations.   He collected the drainage of the pleural effusion today for molecular studies and PD-L1 expression.  We will send it to pathology. The patient is scheduled to start the first cycle of systemic chemotherapy with carboplatin for AUC of 5, Alimta 500 mg/M2 and Keytruda 200 mg IV every 3 weeks on 9/23 2021. I recommended for the patient to proceed with his treatment as planned. For the lack of appetite and weight loss, he will continue his current treatment with Remeron. The patient will come back for follow-up visit in less than 2 weeks for evaluation and management of any adverse effect of his treatment. He was advised to call immediately if he has any concerning symptoms in the interval. The patient voices understanding of current disease status and treatment options and is in agreement with the current care plan. All questions were answered. The patient knows to call the clinic with any problems, questions or concerns. We can certainly see the patient much sooner if necessary.  Disclaimer: This note was dictated with voice recognition software. Similar sounding words can inadvertently be transcribed and may not be corrected upon review.

## 2020-08-13 LAB — CYTOLOGY - NON PAP

## 2020-08-13 NOTE — Telephone Encounter (Signed)
Left detailed message okay to hold omelsartan until patient feels better. Can call back if needed

## 2020-08-14 ENCOUNTER — Telehealth: Payer: Self-pay

## 2020-08-14 ENCOUNTER — Other Ambulatory Visit: Payer: Medicare Other

## 2020-08-14 ENCOUNTER — Other Ambulatory Visit: Payer: Self-pay

## 2020-08-14 DIAGNOSIS — Z515 Encounter for palliative care: Secondary | ICD-10-CM

## 2020-08-14 NOTE — Telephone Encounter (Signed)
(  3:48p) Telephone call to patient to schedule palliative care visit with patient. Patient/family in agreement with home visit, but asked SW to call back as patient was with the therapist.

## 2020-08-14 NOTE — Progress Notes (Signed)
Pharmacist Chemotherapy Monitoring - Initial Assessment    Anticipated start date: 08/14/20   Regimen:  . Are orders appropriate based on the patient's diagnosis, regimen, and cycle? Yes . Does the plan date match the patient's scheduled date? Yes . Is the sequencing of drugs appropriate? Yes . Are the premedications appropriate for the patient's regimen? Yes . Prior Authorization for treatment is: Approved o If applicable, is the correct biosimilar selected based on the patient's insurance? not applicable  Organ Function and Labs: [name] Are dose adjustments needed based on the patient's renal function, hepatic function, or hematologic function? Yes . Are appropriate labs ordered prior to the start of patient's treatment? Yes . Other organ system assessment, if indicated: N/A . The following baseline labs, if indicated, have been ordered: pembrolizumab: baseline TSH +/- T4  Dose Assessment: . Are the drug doses appropriate? Yes . Are the following correct: o Drug concentrations Yes o IV fluid compatible with drug Yes o Administration routes Yes o Timing of therapy Yes . If applicable, does the patient have documented access for treatment and/or plans for port-a-cath placement? no . If applicable, have lifetime cumulative doses been properly documented and assessed? yes Lifetime Dose Tracking  No doses have been documented on this patient for the following tracked chemicals: Doxorubicin, Epirubicin, Idarubicin, Daunorubicin, Mitoxantrone, Bleomycin, Oxaliplatin, Carboplatin, Liposomal Doxorubicin  o   Toxicity Monitoring/Prevention: . The patient has the following take home antiemetics prescribed: Prochlorperazine . The patient has the following take home medications prescribed: B12 for pemetrexed and folic acid for pemetrexed . Medication allergies and previous infusion related reactions, if applicable, have been reviewed and addressed. Yes . The patient's current medication list has  been assessed for drug-drug interactions with their chemotherapy regimen. no significant drug-drug interactions were identified on review.  Order Review: . Are the treatment plan orders signed? Yes . Is the patient scheduled to see a provider prior to their treatment? No  I verify that I have reviewed each item in the above checklist and answered each question accordingly.  Romualdo Bolk [facility] 08/14/2020 11:51 AM

## 2020-08-15 ENCOUNTER — Ambulatory Visit: Payer: Medicare Other | Admitting: Internal Medicine

## 2020-08-15 ENCOUNTER — Inpatient Hospital Stay: Payer: Medicare Other

## 2020-08-15 ENCOUNTER — Other Ambulatory Visit: Payer: Medicare Other

## 2020-08-15 ENCOUNTER — Other Ambulatory Visit: Payer: Self-pay

## 2020-08-15 VITALS — BP 126/71 | HR 61 | Temp 98.5°F | Resp 18

## 2020-08-15 DIAGNOSIS — C3492 Malignant neoplasm of unspecified part of left bronchus or lung: Secondary | ICD-10-CM

## 2020-08-15 DIAGNOSIS — Z5111 Encounter for antineoplastic chemotherapy: Secondary | ICD-10-CM | POA: Diagnosis not present

## 2020-08-15 MED ORDER — PALONOSETRON HCL INJECTION 0.25 MG/5ML
INTRAVENOUS | Status: AC
  Filled 2020-08-15: qty 5

## 2020-08-15 MED ORDER — SODIUM CHLORIDE 0.9 % IV SOLN
150.0000 mg | Freq: Once | INTRAVENOUS | Status: AC
  Administered 2020-08-15: 150 mg via INTRAVENOUS
  Filled 2020-08-15: qty 150

## 2020-08-15 MED ORDER — SODIUM CHLORIDE 0.9 % IV SOLN
200.0000 mg | Freq: Once | INTRAVENOUS | Status: AC
  Administered 2020-08-15: 200 mg via INTRAVENOUS
  Filled 2020-08-15: qty 8

## 2020-08-15 MED ORDER — SODIUM CHLORIDE 0.9 % IV SOLN
448.0000 mg | Freq: Once | INTRAVENOUS | Status: AC
  Administered 2020-08-15: 450 mg via INTRAVENOUS
  Filled 2020-08-15: qty 45

## 2020-08-15 MED ORDER — SODIUM CHLORIDE 0.9 % IV SOLN
Freq: Once | INTRAVENOUS | Status: AC
  Filled 2020-08-15: qty 250

## 2020-08-15 MED ORDER — SODIUM CHLORIDE 0.9 % IV SOLN
500.0000 mg/m2 | Freq: Once | INTRAVENOUS | Status: AC
  Administered 2020-08-15: 1100 mg via INTRAVENOUS
  Filled 2020-08-15: qty 4

## 2020-08-15 MED ORDER — SODIUM CHLORIDE 0.9 % IV SOLN
10.0000 mg | Freq: Once | INTRAVENOUS | Status: AC
  Administered 2020-08-15: 10 mg via INTRAVENOUS
  Filled 2020-08-15: qty 10

## 2020-08-15 MED ORDER — PALONOSETRON HCL INJECTION 0.25 MG/5ML
0.2500 mg | Freq: Once | INTRAVENOUS | Status: AC
  Administered 2020-08-15: 0.25 mg via INTRAVENOUS

## 2020-08-15 NOTE — Patient Instructions (Signed)
[facility] Discharge Instructions for Patients Receiving Chemotherapy  Today you received the following chemotherapy agents: Keytruda, Alimta, Carboplatin   To help prevent nausea and vomiting after your treatment, we encourage you to take your nausea medication as directed.    If you develop nausea and vomiting that is not controlled by your nausea medication, call the clinic.   BELOW ARE SYMPTOMS THAT SHOULD BE REPORTED IMMEDIATELY:  *FEVER GREATER THAN 100.5 F  *CHILLS WITH OR WITHOUT FEVER  NAUSEA AND VOMITING THAT IS NOT CONTROLLED WITH YOUR NAUSEA MEDICATION  *UNUSUAL SHORTNESS OF BREATH  *UNUSUAL BRUISING OR BLEEDING  TENDERNESS IN MOUTH AND THROAT WITH OR WITHOUT PRESENCE OF ULCERS  *URINARY PROBLEMS  *BOWEL PROBLEMS  UNUSUAL RASH Items with * indicate a potential emergency and should be followed up as soon as possible.  Feel free to call the clinic should you have any questions or concerns. The clinic phone number is [phone].  Please show the [facility] at check-in to the Emergency Department and triage nurse.  Pembrolizumab injection What is this medicine? PEMBROLIZUMAB (pem broe liz ue mab) is a monoclonal antibody. It is used to treat certain types of cancer. This medicine may be used for other purposes; ask your health care provider or pharmacist if you have questions. COMMON BRAND NAME(S): Keytruda What should I tell my health care provider before I take this medicine? They need to know if you have any of these conditions:  diabetes  immune system problems  inflammatory bowel disease  liver disease  lung or breathing disease  lupus  received or scheduled to receive an organ transplant or a stem-cell transplant that uses donor stem cells  an unusual or allergic reaction to pembrolizumab, other medicines, foods, dyes, or preservatives  pregnant or trying to get pregnant  breast-feeding How should I use this  medicine? This medicine is for infusion into a vein. It is given by a health care professional in a hospital or clinic setting. A special MedGuide will be given to you before each treatment. Be sure to read this information carefully each time. Talk to your pediatrician regarding the use of this medicine in children. While this drug may be prescribed for children as young as 6 months for selected conditions, precautions do apply. Overdosage: If you think you have taken too much of this medicine contact a poison control center or emergency room at once. NOTE: This medicine is only for you. Do not share this medicine with others. What if I miss a dose? It is important not to miss your dose. Call your doctor or health care professional if you are unable to keep an appointment. What may interact with this medicine? Interactions have not been studied. Give your health care provider a list of all the medicines, herbs, non-prescription drugs, or dietary supplements you use. Also tell them if you smoke, drink alcohol, or use illegal drugs. Some items may interact with your medicine. This list may not describe all possible interactions. Give your health care provider a list of all the medicines, herbs, non-prescription drugs, or dietary supplements you use. Also tell them if you smoke, drink alcohol, or use illegal drugs. Some items may interact with your medicine. What should I watch for while using this medicine? Your condition will be monitored carefully while you are receiving this medicine. You may need blood work done while you are taking this medicine. Do not become pregnant while taking this medicine or for 4 months after  stopping it. Women should inform their doctor if they wish to become pregnant or think they might be pregnant. There is a potential for serious side effects to an unborn child. Talk to your health care professional or pharmacist for more information. Do not breast-feed an infant while  taking this medicine or for 4 months after the last dose. What side effects may I notice from receiving this medicine? Side effects that you should report to your doctor or health care professional as soon as possible:  allergic reactions like skin rash, itching or hives, swelling of the face, lips, or tongue  bloody or black, tarry  breathing problems  changes in vision  chest pain  chills  confusion  constipation  cough  diarrhea  dizziness or feeling faint or lightheaded  fast or irregular heartbeat  fever  flushing  joint pain  low blood counts - this medicine may decrease the number of white blood cells, red blood cells and platelets. You may be at increased risk for infections and bleeding.  muscle pain  muscle weakness  pain, tingling, numbness in the hands or feet  persistent headache  redness, blistering, peeling or loosening of the skin, including inside the mouth  signs and symptoms of high blood sugar such as dizziness; dry mouth; dry skin; fruity breath; nausea; stomach pain; increased hunger or thirst; increased urination  signs and symptoms of kidney injury like trouble passing urine or change in the amount of urine  signs and symptoms of liver injury like dark urine, light-colored stools, loss of appetite, nausea, right upper belly pain, yellowing of the eyes or skin  sweating  swollen lymph nodes  weight loss Side effects that usually do not require medical attention (report to your doctor or health care professional if they continue or are bothersome):  decreased appetite  hair loss  muscle pain  tiredness This list may not describe all possible side effects. Call your doctor for medical advice about side effects. You may report side effects to FDA at 1-800-FDA-1088. Where should I keep my medicine? This drug is given in a hospital or clinic and will not be stored at home. NOTE: This sheet is a summary. It may not cover all  possible information. If you have questions about this medicine, talk to your doctor, pharmacist, or health care provider.  2020 Elsevier/Gold Standard (2019-09-15 18:07:58)  Pemetrexed injection What is this medicine? PEMETREXED (PEM e TREX ed) is a chemotherapy drug used to treat lung cancers like non-small cell lung cancer and mesothelioma. It may also be used to treat other cancers. This medicine may be used for other purposes; ask your health care provider or pharmacist if you have questions. COMMON BRAND NAME(S): Alimta What should I tell my health care provider before I take this medicine? They need to know if you have any of these conditions:  infection (especially a virus infection such as chickenpox, cold sores, or herpes)  kidney disease  low blood counts, like low white cell, platelet, or red cell counts  lung or breathing disease, like asthma  radiation therapy  an unusual or allergic reaction to pemetrexed, other medicines, foods, dyes, or preservative  pregnant or trying to get pregnant  breast-feeding How should I use this medicine? This drug is given as an infusion into a vein. It is administered in a hospital or clinic by a specially trained health care professional. Talk to your pediatrician regarding the use of this medicine in children. Special care may be needed.  Overdosage: If you think you have taken too much of this medicine contact a poison control center or emergency room at once. NOTE: This medicine is only for you. Do not share this medicine with others. What if I miss a dose? It is important not to miss your dose. Call your doctor or health care professional if you are unable to keep an appointment. What may interact with this medicine? This medicine may interact with the following medications:  Ibuprofen This list may not describe all possible interactions. Give your health care provider a list of all the medicines, herbs, non-prescription drugs,  or dietary supplements you use. Also tell them if you smoke, drink alcohol, or use illegal drugs. Some items may interact with your medicine. What should I watch for while using this medicine? Visit your doctor for checks on your progress. This drug may make you feel generally unwell. This is not uncommon, as chemotherapy can affect healthy cells as well as cancer cells. Report any side effects. Continue your course of treatment even though you feel ill unless your doctor tells you to stop. In some cases, you may be given additional medicines to help with side effects. Follow all directions for their use. Call your doctor or health care professional for advice if you get a fever, chills or sore throat, or other symptoms of a cold or flu. Do not treat yourself. This drug decreases your body's ability to fight infections. Try to avoid being around people who are sick. This medicine may increase your risk to bruise or bleed. Call your doctor or health care professional if you notice any unusual bleeding. Be careful brushing and flossing your teeth or using a toothpick because you may get an infection or bleed more easily. If you have any dental work done, tell your dentist you are receiving this medicine. Avoid taking products that contain aspirin, acetaminophen, ibuprofen, naproxen, or ketoprofen unless instructed by your doctor. These medicines may hide a fever. Call your doctor or health care professional if you get diarrhea or mouth sores. Do not treat yourself. To protect your kidneys, drink water or other fluids as directed while you are taking this medicine. Do not become pregnant while taking this medicine or for 6 months after stopping it. Women should inform their doctor if they wish to become pregnant or think they might be pregnant. Men should not father a child while taking this medicine and for 3 months after stopping it. This may interfere with the ability to father a child. You should talk to  your doctor or health care professional if you are concerned about your fertility. There is a potential for serious side effects to an unborn child. Talk to your health care professional or pharmacist for more information. Do not breast-feed an infant while taking this medicine or for 1 week after stopping it. What side effects may I notice from receiving this medicine? Side effects that you should report to your doctor or health care professional as soon as possible:  allergic reactions like skin rash, itching or hives, swelling of the face, lips, or tongue  breathing problems  redness, blistering, peeling or loosening of the skin, including inside the mouth  signs and symptoms of bleeding such as bloody or black, tarry stools; red or dark-brown urine; spitting up blood or brown material that looks like coffee grounds; red spots on the skin; unusual bruising or bleeding from the eye, gums, or nose  signs and symptoms of infection like fever or  chills; cough; sore throat; pain or trouble passing urine  signs and symptoms of kidney injury like trouble passing urine or change in the amount of urine  signs and symptoms of liver injury like dark yellow or brown urine; general ill feeling or flu-like symptoms; light-colored stools; loss of appetite; nausea; right upper belly pain; unusually weak or tired; yellowing of the eyes or skin Side effects that usually do not require medical attention (report to your doctor or health care professional if they continue or are bothersome):  constipation  mouth sores  nausea, vomiting  unusually weak or tired This list may not describe all possible side effects. Call your doctor for medical advice about side effects. You may report side effects to FDA at 1-800-FDA-1088. Where should I keep my medicine? This drug is given in a hospital or clinic and will not be stored at home. NOTE: This sheet is a summary. It may not cover all possible information. If  you have questions about this medicine, talk to your doctor, pharmacist, or health care provider.  2020 Elsevier/Gold Standard (2017-12-29 16:11:33)  Carboplatin injection What is this medicine? CARBOPLATIN (KAR boe pla tin) is a chemotherapy drug. It targets fast dividing cells, like cancer cells, and causes these cells to die. This medicine is used to treat ovarian cancer and many other cancers. This medicine may be used for other purposes; ask your health care provider or pharmacist if you have questions. COMMON BRAND NAME(S): Paraplatin What should I tell my health care provider before I take this medicine? They need to know if you have any of these conditions:  blood disorders  hearing problems  kidney disease  recent or ongoing radiation therapy  an unusual or allergic reaction to carboplatin, cisplatin, other chemotherapy, other medicines, foods, dyes, or preservatives  pregnant or trying to get pregnant  breast-feeding How should I use this medicine? This drug is usually given as an infusion into a vein. It is administered in a hospital or clinic by a specially trained health care professional. Talk to your pediatrician regarding the use of this medicine in children. Special care may be needed. Overdosage: If you think you have taken too much of this medicine contact a poison control center or emergency room at once. NOTE: This medicine is only for you. Do not share this medicine with others. What if I miss a dose? It is important not to miss a dose. Call your doctor or health care professional if you are unable to keep an appointment. What may interact with this medicine?  medicines for seizures  medicines to increase blood counts like filgrastim, pegfilgrastim, sargramostim  some antibiotics like amikacin, gentamicin, neomycin, streptomycin, tobramycin  vaccines Talk to your doctor or health care professional before taking any of these  medicines:  acetaminophen  aspirin  ibuprofen  ketoprofen  naproxen This list may not describe all possible interactions. Give your health care provider a list of all the medicines, herbs, non-prescription drugs, or dietary supplements you use. Also tell them if you smoke, drink alcohol, or use illegal drugs. Some items may interact with your medicine. What should I watch for while using this medicine? Your condition will be monitored carefully while you are receiving this medicine. You will need important blood work done while you are taking this medicine. This drug may make you feel generally unwell. This is not uncommon, as chemotherapy can affect healthy cells as well as cancer cells. Report any side effects. Continue your course of treatment even  though you feel ill unless your doctor tells you to stop. In some cases, you may be given additional medicines to help with side effects. Follow all directions for their use. Call your doctor or health care professional for advice if you get a fever, chills or sore throat, or other symptoms of a cold or flu. Do not treat yourself. This drug decreases your body's ability to fight infections. Try to avoid being around people who are sick. This medicine may increase your risk to bruise or bleed. Call your doctor or health care professional if you notice any unusual bleeding. Be careful brushing and flossing your teeth or using a toothpick because you may get an infection or bleed more easily. If you have any dental work done, tell your dentist you are receiving this medicine. Avoid taking products that contain aspirin, acetaminophen, ibuprofen, naproxen, or ketoprofen unless instructed by your doctor. These medicines may hide a fever. Do not become pregnant while taking this medicine. Women should inform their doctor if they wish to become pregnant or think they might be pregnant. There is a potential for serious side effects to an unborn child. Talk  to your health care professional or pharmacist for more information. Do not breast-feed an infant while taking this medicine. What side effects may I notice from receiving this medicine? Side effects that you should report to your doctor or health care professional as soon as possible:  allergic reactions like skin rash, itching or hives, swelling of the face, lips, or tongue  signs of infection - fever or chills, cough, sore throat, pain or difficulty passing urine  signs of decreased platelets or bleeding - bruising, pinpoint red spots on the skin, black, tarry stools, nosebleeds  signs of decreased red blood cells - unusually weak or tired, fainting spells, lightheadedness  breathing problems  changes in hearing  changes in vision  chest pain  high blood pressure  low blood counts - This drug may decrease the number of white blood cells, red blood cells and platelets. You may be at increased risk for infections and bleeding.  nausea and vomiting  pain, swelling, redness or irritation at the injection site  pain, tingling, numbness in the hands or feet  problems with balance, talking, walking  trouble passing urine or change in the amount of urine Side effects that usually do not require medical attention (report to your doctor or health care professional if they continue or are bothersome):  hair loss  loss of appetite  metallic taste in the mouth or changes in taste This list may not describe all possible side effects. Call your doctor for medical advice about side effects. You may report side effects to FDA at 1-800-FDA-1088. Where should I keep my medicine? This drug is given in a hospital or clinic and will not be stored at home. NOTE: This sheet is a summary. It may not cover all possible information. If you have questions about this medicine, talk to your doctor, pharmacist, or health care provider.  2020 Elsevier/Gold Standard (2008-02-14 14:38:05)

## 2020-08-16 ENCOUNTER — Ambulatory Visit: Payer: Medicare Other

## 2020-08-16 ENCOUNTER — Ambulatory Visit: Payer: Medicare Other | Admitting: Urology

## 2020-08-16 NOTE — Progress Notes (Signed)
COMMUNITY PALLIATIVE CARE SW NOTE  PATIENT NAME: Charles Robinson DOB: Nov 21, 1946 MRN: [id number]  PRIMARY CARE PROVIDER: [name], MD  RESPONSIBLE PARTY:  Acct ID - Guarantor Home Phone Work Phone Relationship Acct Type  [id number] ARJAY, [phone]  Self P/F     [address], [place], [place] [address]   Due to the COVID-19, this visit was done via telephone from my office and it was initiated and consent by this PCG.   PLAN OF CARE and INTERVENTIONS:             1. GOALS OF CARE/ ADVANCE CARE PLANNING:  Goals of care and advance directives to be further assessed. 2. SOCIAL/EMOTIONAL/SPIRITUAL ASSESSMENT/ INTERVENTIONS:  SW completed a telephonic visit with patient/PCG. Patient recently released from the hospital. He is experiencing some grief as his wife recently died while he was hospitalized and her funeral is next week. Patient is slowly recuperating. His appetite is poor. His is experiencing some generalized weakness and fatigue. He has loss a great deal of weight. Patient is scheduled to have physical and occupational therapy through [facility]. He has private sitters in place for 24 hour care. His daughter advised that they have a lot going on. Her priority is to get her dad adjusted back at home and get through her mother's funeral. SW provided education regarding the palliative care program, services provided and visit frequency. His daughter provided verbal consent to services. Initial face-to-face visit scheduled for 10/1@ 1:30 pm.  3. PATIENT/CAREGIVER EDUCATION/ COPING:  Patient and PCG experiencing grief/loss issues related to death of patient's wife.  4. PERSONAL EMERGENCY PLAN:  911 can be activated for emergencies. 5. COMMUNITY RESOURCES COORDINATION/ HEALTH CARE NAVIGATION:  Patient has private 24 hour sitters. Patient will be receiving therapy from [facility].  6. FINANCIAL/LEGAL CONCERNS/INTERVENTIONS:  None noted.     SOCIAL HX:  Social History    Tobacco Use  . Smoking status: Never Smoker  . Smokeless tobacco: Never Used  Substance Use Topics  . Alcohol use: Yes    Alcohol/week: 2.0 standard drinks    Types: 2 Cans of beer per week    Comment: rare    CODE STATUS: To be assessed ADVANCED DIRECTIVES: No MOST FORM COMPLETE:  No HOSPICE EDUCATION PROVIDED: No  PPS: Patient is having generalized weakness and increased fatigue. He is alert and oriented to self and situation.    Duration of telephonic visit and documentation: 30 minutes   Katheren Puller, LCSW

## 2020-08-21 ENCOUNTER — Encounter (HOSPITAL_COMMUNITY): Payer: Self-pay | Admitting: Internal Medicine

## 2020-08-22 ENCOUNTER — Telehealth: Payer: Self-pay | Admitting: Internal Medicine

## 2020-08-22 ENCOUNTER — Other Ambulatory Visit: Payer: Self-pay

## 2020-08-22 ENCOUNTER — Inpatient Hospital Stay: Payer: Medicare Other

## 2020-08-22 ENCOUNTER — Inpatient Hospital Stay (HOSPITAL_BASED_OUTPATIENT_CLINIC_OR_DEPARTMENT_OTHER): Payer: Medicare Other | Admitting: Internal Medicine

## 2020-08-22 ENCOUNTER — Encounter: Payer: Self-pay | Admitting: Internal Medicine

## 2020-08-22 VITALS — BP 97/63 | HR 74 | Temp 97.9°F | Resp 17 | Ht 75.0 in | Wt 192.0 lb

## 2020-08-22 DIAGNOSIS — I6523 Occlusion and stenosis of bilateral carotid arteries: Secondary | ICD-10-CM

## 2020-08-22 DIAGNOSIS — C3492 Malignant neoplasm of unspecified part of left bronchus or lung: Secondary | ICD-10-CM | POA: Diagnosis not present

## 2020-08-22 DIAGNOSIS — C7951 Secondary malignant neoplasm of bone: Secondary | ICD-10-CM | POA: Diagnosis not present

## 2020-08-22 DIAGNOSIS — Z5111 Encounter for antineoplastic chemotherapy: Secondary | ICD-10-CM | POA: Diagnosis not present

## 2020-08-22 DIAGNOSIS — J90 Pleural effusion, not elsewhere classified: Secondary | ICD-10-CM | POA: Diagnosis not present

## 2020-08-22 DIAGNOSIS — I10 Essential (primary) hypertension: Secondary | ICD-10-CM | POA: Diagnosis not present

## 2020-08-22 DIAGNOSIS — Z5112 Encounter for antineoplastic immunotherapy: Secondary | ICD-10-CM

## 2020-08-22 LAB — CMP (CANCER CENTER ONLY)
ALT: 21 U/L (ref 0–44)
AST: 22 U/L (ref 15–41)
Albumin: 2.4 g/dL — ABNORMAL LOW (ref 3.5–5.0)
Alkaline Phosphatase: 81 U/L (ref 38–126)
Anion gap: 6 (ref 5–15)
BUN: 21 mg/dL (ref 8–23)
CO2: 29 mmol/L (ref 22–32)
Calcium: 8.5 mg/dL — ABNORMAL LOW (ref 8.9–10.3)
Chloride: 105 mmol/L (ref 98–111)
Creatinine: 1.16 mg/dL (ref 0.61–1.24)
GFR, Est AFR Am: >60 mL/min (ref >60)
GFR, Estimated: >60 mL/min (ref >60)
Glucose, Bld: 105 mg/dL — ABNORMAL HIGH (ref 70–99)
Potassium: 3.9 mmol/L (ref 3.5–5.1)
Sodium: 140 mmol/L (ref 135–145)
Total Bilirubin: 0.7 mg/dL (ref 0.3–1.2)
Total Protein: 5.8 g/dL — ABNORMAL LOW (ref 6.5–8.1)

## 2020-08-22 LAB — CBC WITH DIFFERENTIAL (CANCER CENTER ONLY)
Abs Immature Granulocytes: 0 10*3/uL (ref 0.00–0.07)
Basophils Absolute: 0 10*3/uL (ref 0.0–0.1)
Basophils Relative: 1 %
Eosinophils Absolute: 0.1 10*3/uL (ref 0.0–0.5)
Eosinophils Relative: 9 %
HCT: 33.6 % — ABNORMAL LOW (ref 39.0–52.0)
Hemoglobin: 10.7 g/dL — ABNORMAL LOW (ref 13.0–17.0)
Immature Granulocytes: 0 %
Lymphocytes Relative: 21 %
Lymphs Abs: 0.2 10*3/uL — ABNORMAL LOW (ref 0.7–4.0)
MCH: 28.1 pg (ref 26.0–34.0)
MCHC: 31.8 g/dL (ref 30.0–36.0)
MCV: 88.2 fL (ref 80.0–100.0)
Monocytes Absolute: 0.1 10*3/uL (ref 0.1–1.0)
Monocytes Relative: 9 %
Neutro Abs: 0.7 10*3/uL — ABNORMAL LOW (ref 1.7–7.7)
Neutrophils Relative %: 60 %
Platelet Count: 129 10*3/uL — ABNORMAL LOW (ref 150–400)
RBC: 3.81 MIL/uL — ABNORMAL LOW (ref 4.22–5.81)
RDW: 13.8 % (ref 11.5–15.5)
WBC Count: 1.2 10*3/uL — ABNORMAL LOW (ref 4.0–10.5)
nRBC: 0 % (ref 0.0–0.2)

## 2020-08-22 NOTE — Telephone Encounter (Signed)
Scheduled per 9/30 los. Printed appt calendar for pt.

## 2020-08-22 NOTE — Progress Notes (Signed)
Charles Robinson Telephone:([phone]) [phone]   [phone]) [phone]  OFFICE PROGRESS NOTE  Charles Redwood, MD [place] [address]  DIAGNOSIS: Stage IV (T3, N2, M1c) non-small cell lung cancer, adenocarcinoma presented with left upper lobe lung mass with occlusion of the left upper lobe bronchus and near complete left lower lobe atelectasis in addition to mediastinal lymphadenopathy in the AP window and suspicious malignant left pleural effusion and multifocal bone metastasis diagnosed in August 2021.  BIOMARKER(S) % CFDNA OR AMPLIFICATION ASSOCIATED FDA-APPROVED THERAPIES CLINICAL TRIAL AVAILABILITY [id number]* 0.2% None Yes  [id number] 0.2% None Yes  PRIOR THERAPY:  Status post left Pleurx placement for drainage of recurrent pleural effusion under the care of Dr. Roxan Robinson on 08/05/2020.  CURRENT THERAPY:  Systemic chemotherapy with carboplatin for AUC of 5, Alimta 500 mg/M2 and Keytruda 200 mg IV every 3 weeks.  First dose 08/15/2020.  Status post 1 cycle.  INTERVAL HISTORY: Charles Robinson 74 y.o. male returns to the clinic today for follow-up visit accompanied by his wife.  The patient is feeling fine today with no concerning complaints except for the baseline fatigue and generalized weakness.  He tolerated the first week of his systemic chemotherapy fairly well except for 3 episodes of nausea improved with Compazine and 1 day with diarrhea that resolved spontaneously.  He continues to have drainage of the left pleural effusion around 300 mL daily.  He denied having any chest pain but has shortness of breath with exertion with mild cough and no hemoptysis.  He denied having any headache or visual changes.  He lost few pounds since his last visit.  The patient is here today for evaluation and repeat blood work.  The fluid collected from the pleural effusion for molecular studies did not have enough cellularity to run the test by foundation 1.  MEDICAL  HISTORY: Past Medical History:  Diagnosis Date  . Arthritis    back  . BPH (benign prostatic hypertrophy)   . CAD S/P multivessel PCI: LAD, RCA and extensive LCX-OM1 03/15/2012   s/p PCI to LAD, to RCA (now occluded), then extensive PCI to Dominant LCx-OM1  (enitre prox-AVG Circ for dissection in 07/2011)  . Cancer ([place])    SKIN CA  . Dyspnea   . Dysrhythmia    afib  . GERD (gastroesophageal reflux disease)   . Heart murmur   . History of colon polyps   . History of echocardiogram 08/16/2011   Echo - EF 60-65%; moderate LV concentric hypertrophy; abdnormal LV relaxation (grade 1 diastolic dysfunction; ascending aorta mildly dilated; LA moderately dilated;   . Hyperlipidemia   . Hypertension   . 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 PAF (paroxysmal atrial fibrillation) ([place]) 12/20/2012   followed by Dr. Glenetta Robinson; s/p DCCV 2011, 07/2011 post PCI with Type 4a MI; CHA2DS2Vasc = 3, on Warfarin  . Paroxysmal atrial flutter ([place]) 08/20/2011   TEE- atrial septum - no defect identified; RA normal in size, no evidence of thrombus; ascending aorta normal  . Prostate cancer ([place])   . Rupture quadriceps tendon     ALLERGIES:  is allergic to lipitor [atorvastatin], morphine and related, and vicodin [hydrocodone-acetaminophen].  MEDICATIONS:  Current Outpatient Medications  Medication Sig Dispense Refill  . acetaminophen (TYLENOL) 325 MG tablet Take 650 mg by mouth every 6 (six) hours as needed for moderate pain.    [name] amiodarone (PACERONE) 200 MG  tablet Take 1 tablet (200 mg total) by mouth daily.    . Coenzyme Q10 200 MG capsule Take 200 mg by mouth every morning.     [name] ELIQUIS 5 MG TABS tablet TAKE 1 TABLET BY MOUTH TWICE A DAY (Patient taking differently: Take 5 mg by mouth 2 (two) times daily. ) 180 tablet 2  . ezetimibe (ZETIA) 10 MG tablet Take 1 tablet (10 mg total) by mouth daily. 90 tablet 2  .  finasteride (PROSCAR) 5 MG tablet Take 5 mg by mouth every evening.     . folic acid (FOLVITE) 1 MG tablet Take 1 tablet (1 mg total) by mouth daily. 30 tablet 4  . hydroxypropyl methylcellulose / hypromellose (ISOPTO TEARS / GONIOVISC) 2.5 % ophthalmic solution Place 1 drop into both eyes 3 (three) times daily as needed for dry eyes.    [name] Leuprolide Acetate (ELIGARD [place]) Inject into the skin.     . metoprolol tartrate (LOPRESSOR) 25 MG tablet Take 1 tablet (25 mg total) by mouth 2 (two) times daily. 180 tablet 3  . mirtazapine (REMERON) 30 MG tablet Take 1 tablet (30 mg total) by mouth at bedtime. 30 tablet 2  . Multiple Vitamin (MULTIVITAMIN WITH MINERALS) TABS tablet Take 1 tablet by mouth every morning.     . nitroGLYCERIN (NITROSTAT) 0.4 MG SL tablet PLACE 1 TABLET (0.4 MG TOTAL) UNDER THE TONGUE EVERY 5 (FIVE) MINUTES AS NEEDED. FOR CHEST PAIN (Patient taking differently: Place 0.4 mg under the tongue every 5 (five) minutes as needed for chest pain. ) 25 tablet 4  . olmesartan (BENICAR) 20 MG tablet Take 20 mg by mouth daily. (Patient not taking: Reported on 08/12/2020)    . Omega-3 Fatty Acids (FISH OIL) 1000 MG CAPS Take 1 capsule (1,000 mg total) by mouth 2 (two) times daily. 60 capsule 0  . prochlorperazine (COMPAZINE) 10 MG tablet Take 1 tablet (10 mg total) by mouth every 6 (six) hours as needed for nausea or vomiting. 30 tablet 0  . rosuvastatin (CRESTOR) 40 MG tablet TAKE 1 TABLET BY MOUTH EVERY DAY (Patient taking differently: Take 40 mg by mouth daily. ) 90 tablet 1  . tamsulosin (FLOMAX) 0.4 MG CAPS capsule Take 0.4 mg by mouth daily.    . traMADol (ULTRAM) 50 MG tablet Take 1-2 tablets (50-100 mg total) by mouth every 6 (six) hours as needed for moderate pain or severe pain. 30 tablet 0  . VENTOLIN HFA 108 (90 Base) MCG/ACT inhaler Inhale into the lungs.      No current facility-administered medications for this visit.    SURGICAL HISTORY:  Past Surgical History:  Procedure  Laterality Date  . BACK SURGERY    . CHEST TUBE INSERTION Left 08/05/2020   Procedure: INSERTION LEFT PLEURAL DRAINAGE CATHETER;  Surgeon: Charles Nakayama, MD;  Location: [facility];  Service: Thoracic;  Laterality: Left;  . COLONOSCOPY    . CORONARY ANGIOPLASTY WITH STENT PLACEMENT  03/15/2012   multiple stens in AVGroove Circ & OM1;; 2 site PTCA w/ PTCA and stenting of OM2 ang PTCA of distal stent ISR followed by PTCA of mid LCx ISR  . CORONARY STENT PLACEMENT  2003 - 2011 - 2012 - 2013   Pre-2012 - BMS in RCA now known occluded, BMS in proximal LAD; 07/2011 - 2.25 x 23 BMS in OM 1 with significant disease on either side noted shortly after -->  2 additional overlapping DES 2.5 mm x 30 mm and 2.25 mm x 26  mm in OM1,; 3 overlapping Resolute DES in AV groove Cx crossing OM1: (Tapered from 3.8-2.6 mm)- Resolute DES 3.5 x 22, 3.0 x 38, 2.5 x 14  . DOPPLER ECHOCARDIOGRAPHY  07/2011   EF ~60-65%, Grade 1 D Dysfxn; mod Conc LVH  . ENDARTERECTOMY Left 02/27/2016   Procedure: LEFT  CAROTID ARTERY ENDARTERECTOMY ;  Surgeon: Serafina Mitchell, MD;  Location: [facility];  Service: Vascular;  Laterality: Left;  . LEFT HEART CATHETERIZATION WITH CORONARY ANGIOGRAM N/A 03/15/2012   Procedure: LEFT HEART CATHETERIZATION WITH CORONARY ANGIOGRAM;  Surgeon: [name], MD;  Location: [facility] CATH LAB: patent LAD stents, patent LCx stents w/80% focal ISR just distal to OM; OM proximal stent open; -- 2 site PTCA-PCI  . LUMBAR LAMINECTOMY/DECOMPRESSION MICRODISCECTOMY Bilateral 12/11/2014   Procedure: Bilateral Lumbar Three-Four Laminectomy;  Surgeon: Kristeen Miss, MD;  Location: [facility];  Service: Neurosurgery;  Laterality: Bilateral;  bilateral  . NM MYOVIEW LTD  11/2012   No ischemia or infarction, normal EF & WM  . PATCH ANGIOPLASTY Left 02/27/2016   Procedure: WITH HEMASHIELD DACRON  PATCH ANGIOPLASTY;  Surgeon: Serafina Mitchell, MD;  Location: [facility];  Service: Vascular;  Laterality: Left;  [name] QUADRICEPS TENDON REPAIR   09/19/2012   Procedure: REPAIR QUADRICEP TENDON;  Surgeon: [name], MD;  Location: [facility];  Service: Orthopedics;  Laterality: Left;  [name] VASECTOMY      REVIEW OF SYSTEMS:  A comprehensive review of systems was negative except for: Constitutional: positive for fatigue and weight loss Respiratory: positive for cough and dyspnea on exertion Musculoskeletal: positive for muscle weakness   PHYSICAL EXAMINATION: General appearance: alert, cooperative, fatigued and no distress Head: Normocephalic, without obvious abnormality, atraumatic Neck: no adenopathy, no JVD, supple, symmetrical, trachea midline and thyroid not enlarged, symmetric, no tenderness/mass/nodules Lymph nodes: Cervical, supraclavicular, and axillary nodes normal. Resp: clear to auscultation bilaterally Back: symmetric, no curvature. ROM normal. No CVA tenderness. Cardio: regular rate and rhythm, S1, S2 normal, no murmur, click, rub or gallop GI: soft, non-tender; bowel sounds normal; no masses,  no organomegaly Extremities: extremities normal, atraumatic, no cyanosis or edema  ECOG PERFORMANCE STATUS: 1 - Symptomatic but completely ambulatory  Blood pressure 97/63, pulse 74, temperature 97.9 F (36.6 C), temperature source Tympanic, resp. rate 17, height 6\' 3"  (1.905 m), weight 192 lb (87.1 kg), SpO2 100 %.  LABORATORY DATA: Lab Results  Component Value Date   WBC 1.2 (L) 08/22/2020   HGB 10.7 (L) 08/22/2020   HCT 33.6 (L) 08/22/2020   MCV 88.2 08/22/2020   PLT 129 (L) 08/22/2020      Chemistry      Component Value Date/Time   NA 142 08/12/2020 1010   NA 140 08/15/2019 0936   K 3.6 08/12/2020 1010   CL 104 08/12/2020 1010   CO2 32 08/12/2020 1010   BUN 22 08/12/2020 1010   BUN 27 08/15/2019 0936   CREATININE 1.24 08/12/2020 1010   CREATININE 1.43 (H) 07/29/2016 0814      Component Value Date/Time   CALCIUM 8.9 08/12/2020 1010   ALKPHOS 89 08/12/2020 1010   AST 34 08/12/2020 1010   ALT 31 08/12/2020  1010   BILITOT 1.0 08/12/2020 1010       RADIOGRAPHIC STUDIES: DG Chest 2 View  Result Date: 08/02/2020 CLINICAL DATA:  Preadmit for PleurX catheter placement. EXAM: CHEST - 2 VIEW COMPARISON:  Chest x-ray 07/31/2020 FINDINGS: Stable cardiomegaly. Stable left suprahilar fullness. Stable prominent left pleural effusion. Stable left  pleural linear density. Lung markings are noted lateral this linear density suggesting that this linear density represent scarring. Right lung is clear. Thoracic spine scoliosis and degenerative change again noted. IMPRESSION: 1. Stable prominent left pleural effusion. Stable left pleural thickening. 2.  Stable left suprahilar prominence. 3.  Stable cardiomegaly. Electronically Signed   By: Marcello Moores  Register   On: 08/02/2020 12:50   DG Chest 2 View  Result Date: 07/31/2020 CLINICAL DATA:  Recurrent left pleural effusion. History of stage IV adenocarcinoma of the left lung. EXAM: CHEST - 2 VIEW COMPARISON:  Chest x-ray 07/19/2020 FINDINGS: Moderate to large left pleural effusion is noted approximately halfway up the thorax. Overlying atelectasis is again demonstrated. Stable fullness in the left hilum and AP window may be residual tumor or radiation change. The right lung is grossly clear.  Remote rib fractures are noted. IMPRESSION: 1. Moderate to large left pleural effusion. 2. Stable left hilar and AP window fullness. Electronically Signed   By: Marijo Sanes M.D.   On: 07/31/2020 15:17   MR BRAIN W WO CONTRAST  Result Date: 07/26/2020 CLINICAL DATA:  Non-small cell lung carcinoma staging EXAM: MRI HEAD WITHOUT AND WITH CONTRAST TECHNIQUE: Multiplanar, multiecho pulse sequences of the brain and surrounding structures were obtained without and with intravenous contrast. CONTRAST:  25mL GADAVIST GADOBUTROL 1 MMOL/ML IV SOLN COMPARISON:  None. FINDINGS: Brain: No acute infarct, acute hemorrhage or extra-axial collection. Normal white matter signal. Normal volume of CSF spaces.  No chronic microhemorrhage. Normal midline structures. There is no abnormal contrast enhancement. Vascular: Abnormal left internal carotid artery flow void. Skull and upper cervical spine: Normal marrow signal. Sinuses/Orbits: Negative. Other: None. IMPRESSION: 1. No intracranial metastatic disease. 2. Abnormal left internal carotid artery flow void, consistent with reduced flow or occlusion. Correlation with carotid ultrasound or CTA of the neck is recommended. These results will be called to the ordering clinician or representative by the Radiologist Assistant, and communication documented in the PACS or [employer]. Electronically Signed   By: Ulyses Jarred M.D.   On: 07/26/2020 03:33   DG C-Arm 1-60 Min-No Report  Result Date: 08/05/2020 Fluoroscopy was utilized by the requesting physician.  No radiographic interpretation.    ASSESSMENT AND PLAN: This is a very pleasant 74 years old white male recently diagnosed with a stage IV (T3, N2, M1 C) non-small cell lung cancer, adenocarcinoma presented with left upper lobe lung mass with occlusion of the left upper lobe bronchus as well as noted complete left lower lobe atelectasis in addition to mediastinal lymphadenopathy in the AP window as well as malignant left pleural effusion and bone metastasis diagnosed in August 2021. The molecular studies by [employer] showed no actionable mutations.   The collected fluid from the pleural effusion did not have enough cellularity to run the molecular studies by foundation 1. The patient is currently on systemic chemotherapy with carboplatin for AUC of 5, Alimta 500 mg/M2 and Keytruda 200 mg IV every 3 weeks on 9/23 2021.  Status post 1 cycle.  He tolerated the first week of his treatment well except for few episodes of nausea improved with Compazine and one episode of diarrhea. I recommended for the patient to continue his treatment as planned and he is expected to start cycle #2 in 2 weeks. The patient had  chemotherapy-induced neutropenia and I will monitor him closely and consider the patient for treatment with Granix if any worsening of his neutropenia or if the patient develop any fever or chills he  was advised to go immediately to the hospital. For the lack of appetite he will continue his current treatment with Remeron. The patient will come back for follow-up visit in 2 weeks for evaluation before starting cycle #2. He was advised to call immediately if he has any other concerning symptoms in the interval. The patient voices understanding of current disease status and treatment options and is in agreement with the current care plan. All questions were answered. The patient knows to call the clinic with any problems, questions or concerns. We can certainly see the patient much sooner if necessary.  Disclaimer: This note was dictated with voice recognition software. Similar sounding words can inadvertently be transcribed and may not be corrected upon review.

## 2020-08-23 ENCOUNTER — Other Ambulatory Visit: Payer: Medicare Other | Admitting: *Deleted

## 2020-08-23 ENCOUNTER — Telehealth: Payer: Self-pay | Admitting: *Deleted

## 2020-08-25 ENCOUNTER — Other Ambulatory Visit: Payer: Self-pay | Admitting: Thoracic Surgery (Cardiothoracic Vascular Surgery)

## 2020-08-26 ENCOUNTER — Other Ambulatory Visit: Payer: Self-pay | Admitting: Thoracic Surgery (Cardiothoracic Vascular Surgery)

## 2020-08-26 ENCOUNTER — Encounter: Payer: Self-pay | Admitting: *Deleted

## 2020-08-26 DIAGNOSIS — J90 Pleural effusion, not elsewhere classified: Secondary | ICD-10-CM

## 2020-08-26 DIAGNOSIS — R918 Other nonspecific abnormal finding of lung field: Secondary | ICD-10-CM

## 2020-08-26 NOTE — Progress Notes (Signed)
Oncology Nurse Navigator Documentation  Oncology Nurse Navigator Flowsheets 08/26/2020  Abnormal Finding Date 07/11/2020  Confirmed Diagnosis Date 07/19/2020  Diagnosis Status Confirmed Diagnosis Complete  Planned Course of Treatment Chemotherapy  Phase of Treatment Chemo  Chemotherapy Pending- Reason: Molecular Testing (Blood)  Chemotherapy Actual Start Date: 08/15/2020  Hormonal Therapy Pending- Reason: -  Hormonal Actual Start Date: -  Radiation Pending-Reason: -  Radiation Actual Start Date: -  Radiation Actual End Date: -  Navigator Follow Up Date: 08/26/2020  Navigator Follow Up Reason: Appointment Review  Navigator Location [facility]  Referral Date to RadOnc/MedOnc -  Navigator Encounter Type Other:  Telephone -  Treatment Initiated Date 08/15/2020  Patient Visit Type Other  Treatment Phase Treatment  Barriers/Navigation Needs Coordination of Care  Education -  Interventions Coordination of Care  Acuity Level 2-Minimal Needs (1-2 Barriers Identified)  Coordination of Care Other  Education Method -  Support Groups/Services -  Time Spent with Patient 30

## 2020-08-27 ENCOUNTER — Ambulatory Visit (INDEPENDENT_AMBULATORY_CARE_PROVIDER_SITE_OTHER): Payer: Medicare Other | Admitting: Thoracic Surgery (Cardiothoracic Vascular Surgery)

## 2020-08-27 ENCOUNTER — Other Ambulatory Visit: Payer: Self-pay

## 2020-08-27 ENCOUNTER — Encounter: Payer: Self-pay | Admitting: Thoracic Surgery (Cardiothoracic Vascular Surgery)

## 2020-08-27 ENCOUNTER — Ambulatory Visit
Admission: RE | Admit: 2020-08-27 | Discharge: 2020-08-27 | Disposition: A | Discharge status: Home / Self Care | Payer: Medicare Other | Source: Ambulatory Visit | Attending: Thoracic Surgery (Cardiothoracic Vascular Surgery) | Admitting: Thoracic Surgery (Cardiothoracic Vascular Surgery)

## 2020-08-27 ENCOUNTER — Telehealth: Payer: Self-pay | Admitting: *Deleted

## 2020-08-27 VITALS — BP 97/67 | HR 133 | Temp 97.5°F | Resp 20 | Ht 75.0 in | Wt 193.0 lb

## 2020-08-27 DIAGNOSIS — J90 Pleural effusion, not elsewhere classified: Secondary | ICD-10-CM

## 2020-08-27 DIAGNOSIS — I6523 Occlusion and stenosis of bilateral carotid arteries: Secondary | ICD-10-CM

## 2020-08-27 DIAGNOSIS — J910 Malignant pleural effusion: Secondary | ICD-10-CM | POA: Diagnosis not present

## 2020-08-27 NOTE — Progress Notes (Signed)
[address]       [place] [address]             [phone]       HPI: Charles Robinson returns for follow-up with his malignant pleural effusion.  Charles Robinson is a 74 year old man recently diagnosed with stage IV lung cancer with malignant left pleural effusion.  I placed a pleural catheter on 08/05/2020.  The catheter drained about 2 L of placement.  He has been draining the catheter every day.  The most recent 3 drainages were 350/200/200 mL respectively.  He did strain the catheter yesterday.  He has had significant symptomatic relief since the catheter was placed.  He has had 1 cycle of chemotherapy.  He has his next one in about 10 days.  Past Medical History:  Diagnosis Date  . Arthritis    back  . BPH (benign prostatic hypertrophy)   . CAD S/P multivessel PCI: LAD, RCA and extensive LCX-OM1 03/15/2012   s/p PCI to LAD, to RCA (now occluded), then extensive PCI to Dominant LCx-OM1  (enitre prox-AVG Circ for dissection in 07/2011)  . Cancer ([place])    SKIN CA  . Dyspnea   . Dysrhythmia    afib  . GERD (gastroesophageal reflux disease)   . Heart murmur   . History of colon polyps   . History of echocardiogram 08/16/2011   Echo - EF 60-65%; moderate LV concentric hypertrophy; abdnormal LV relaxation (grade 1 diastolic dysfunction; ascending aorta mildly dilated; LA moderately dilated;   . Hyperlipidemia   . Hypertension   . 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 PAF (paroxysmal atrial fibrillation) ([place]) 12/20/2012   followed by Dr. Glenetta Hew; s/p DCCV 2011, 07/2011 post PCI with Type 4a MI; CHA2DS2Vasc = 3, on Warfarin  . Paroxysmal atrial flutter ([place]) 08/20/2011   TEE- atrial septum - no defect identified; RA normal in size, no evidence of thrombus; ascending aorta normal  . Prostate cancer ([place])   . Rupture quadriceps tendon     Current Outpatient Medications   Medication Sig Dispense Refill  . acetaminophen (TYLENOL) 325 MG tablet Take 650 mg by mouth every 6 (six) hours as needed for moderate pain.    [name] amiodarone (PACERONE) 200 MG tablet Take 1 tablet (200 mg total) by mouth daily.    . Coenzyme Q10 200 MG capsule Take 200 mg by mouth every morning.     [name] ELIQUIS 5 MG TABS tablet TAKE 1 TABLET BY MOUTH TWICE A DAY (Patient taking differently: Take 5 mg by mouth 2 (two) times daily. ) 180 tablet 2  . ezetimibe (ZETIA) 10 MG tablet Take 1 tablet (10 mg total) by mouth daily. 90 tablet 2  . hydroxypropyl methylcellulose / hypromellose (ISOPTO TEARS / GONIOVISC) 2.5 % ophthalmic solution Place 1 drop into both eyes 3 (three) times daily as needed for dry eyes.    [name] Leuprolide Acetate (ELIGARD [place]) Inject into the skin.     . metoprolol tartrate (LOPRESSOR) 25 MG tablet Take 1 tablet (25 mg total) by mouth 2 (two) times daily. 180 tablet 3  . mirtazapine (REMERON) 30 MG tablet Take 1 tablet (30 mg total) by mouth at bedtime. 30 tablet 2  . Multiple Vitamin (MULTIVITAMIN WITH MINERALS) TABS tablet Take 1 tablet by mouth every morning.     . nitroGLYCERIN (NITROSTAT) 0.4 MG SL tablet PLACE 1 TABLET (0.4  MG TOTAL) UNDER THE TONGUE EVERY 5 (FIVE) MINUTES AS NEEDED. FOR CHEST PAIN (Patient taking differently: Place 0.4 mg under the tongue every 5 (five) minutes as needed for chest pain. ) 25 tablet 4  . Omega-3 Fatty Acids (FISH OIL) 1000 MG CAPS Take 1 capsule (1,000 mg total) by mouth 2 (two) times daily. 60 capsule 0  . prochlorperazine (COMPAZINE) 10 MG tablet Take 1 tablet (10 mg total) by mouth every 6 (six) hours as needed for nausea or vomiting. 30 tablet 0  . rosuvastatin (CRESTOR) 40 MG tablet TAKE 1 TABLET BY MOUTH EVERY DAY (Patient taking differently: Take 40 mg by mouth daily. ) 90 tablet 1  . tamsulosin (FLOMAX) 0.4 MG CAPS capsule Take 0.4 mg by mouth daily.    . finasteride (PROSCAR) 5 MG tablet Take 5 mg by mouth every evening.  (Patient not  taking: Reported on 08/27/2020)    . folic acid (FOLVITE) 1 MG tablet Take 1 tablet (1 mg total) by mouth daily. 30 tablet 4  . olmesartan (BENICAR) 20 MG tablet Take 20 mg by mouth daily. (Patient not taking: Reported on 08/12/2020)    . VENTOLIN HFA 108 (90 Base) MCG/ACT inhaler Inhale into the lungs.      No current facility-administered medications for this visit.    Physical Exam BP 97/67 (BP Location: Right Arm, Patient Position: Sitting, Cuff Size: Normal)   Pulse (!) 133   Temp (!) 97.5 F (36.4 C)   Resp 20   Ht 6\' 3"  (1.905 m)   Wt 193 lb (87.5 kg)   SpO2 98% Comment: RA  BMI 24.35 kg/m  74 year old man in no acute distress Alert and oriented x3 with no focal deficits Tachycardic and regular Lungs diminished at left base but otherwise clear Catheter site clean and dry  Diagnostic Tests: CHEST - 2 VIEW  COMPARISON:  August 02, 2020.  FINDINGS: There is a PleurX catheter on the left with left pleural effusion much smaller than on previous study. A small residual left pleural effusion is noted with atelectatic change in the left base. There is a suspected chronic loculated pneumothorax in the apical and apicolateral regions, stable compared to several recent studies. There is apparent scarring in the left upper lobe medially. New ill-defined nodular opacity in the medial left upper lobe is present, measuring 2.7 x 2.3 cm.  Right lung is clear. Heart is upper normal in size with pulmonary vascularity normal. No adenopathy evident. Aortic atherosclerosis noted. Apparent healing fracture of the right posterior sixth rib.  IMPRESSION: Significantly smaller left pleural effusion with PleurX catheter present. Residual effusion with atelectasis left base. Question loculated pneumothorax in the right apical/apicolateral region, stable from several prior studies. No tension component.  Scarring left upper lobe. Ill-defined opacity left upper lobe, new from prior  studies. This finding may warrant noncontrast chest CT to further assess.  Right lung clear.  Stable cardiac silhouette.  Aortic Atherosclerosis (ICD10-I70.0).  These results will be called to the ordering clinician or representative by the Radiologist Assistant, and communication documented in the PACS or [employer].   Electronically Signed   By: Lowella Grip III M.D.   On: 08/27/2020 09:56 I personally reviewed the chest x-ray and concur with the findings noted above  Impression: Charles Robinson is a 75 year old man recently diagnosed with stage IV lung cancer.  He has a malignant left pleural effusion.  We placed Pleurx catheter for that about 3 weeks ago.  He drained a lot initially  but the drainage has been tapering down.  He drained about 200 yesterday and has less than 100 and the canister today.  I think at this point we can go to every other day drainage.  We will continue with that as long as the drainage is less than 500 mL a day.  If he is consistently below 200 a day 3 times in a row then he can go to every third day.  Chest x-ray does show some residual loculated fluid posterior laterally.  Plan: Change to every other day drainage Return in 1 month with PA lateral chest x-ray He knows he can call sooner if need arises.  Melrose Nakayama, MD [facility] [phone]

## 2020-08-27 NOTE — Telephone Encounter (Signed)
Received call from [facility] at [facility] for call report on pt's recent chest xray. Pt to be seen today in office by Dr. Roxan Hockey.

## 2020-08-28 ENCOUNTER — Encounter: Payer: Medicare Other | Admitting: Gastroenterology

## 2020-08-28 ENCOUNTER — Telehealth: Payer: Self-pay

## 2020-08-28 NOTE — Telephone Encounter (Signed)
Patient's home health nurse, Rory Percy with [employer] [phone] contacted per Dr. Leonarda Salon request.  Advised for patient to decrease PleurX drainage schedule to every other day.  Advised that if patient has 3 consecutive drains of less 150 ml's she should contact the office for further drainage instructions.  Also advised that if patient drains more than 500 ml's of pleural fluid from pleurX, drainage schedule should increase back to every day.  She acknowledged receipt.

## 2020-08-28 NOTE — Telephone Encounter (Signed)
Non applicable

## 2020-08-28 NOTE — Telephone Encounter (Signed)
Charles Robinson with [facility], [phone] contacted the office with concerns about patient home health.  She stated that medicare would not cover his home health visits 7 days a week.  She said that they would be able to do three times a week.  Patient is draining his Pleurx every other day, which would leave drainage on the weekends up to patient and family.  Spoke with patient's wife at follow-up appointment whom stated that patient could not change/ drain his Pleurx by himself.  She is legally blind, and they do not have any other family or friends that would be able to drain for him.  Patient's wife also stated that they were not able to come to the office to drain on week days as they have to find a driver.  T stated that she would contact the patient's wife back and talk with her about next steps.  Advised that Dr. Roxan Hockey decreased patient's drainage schedule to every other day.  She acknowledged receipt.

## 2020-08-29 ENCOUNTER — Other Ambulatory Visit: Payer: Self-pay

## 2020-08-29 ENCOUNTER — Inpatient Hospital Stay: Payer: Medicare Other | Attending: Internal Medicine

## 2020-08-29 ENCOUNTER — Telehealth: Payer: Self-pay | Admitting: Medical Oncology

## 2020-08-29 ENCOUNTER — Telehealth: Payer: Self-pay

## 2020-08-29 DIAGNOSIS — Z8546 Personal history of malignant neoplasm of prostate: Secondary | ICD-10-CM | POA: Diagnosis not present

## 2020-08-29 DIAGNOSIS — I2510 Atherosclerotic heart disease of native coronary artery without angina pectoris: Secondary | ICD-10-CM | POA: Insufficient documentation

## 2020-08-29 DIAGNOSIS — N400 Enlarged prostate without lower urinary tract symptoms: Secondary | ICD-10-CM | POA: Insufficient documentation

## 2020-08-29 DIAGNOSIS — Z9221 Personal history of antineoplastic chemotherapy: Secondary | ICD-10-CM | POA: Insufficient documentation

## 2020-08-29 DIAGNOSIS — M1990 Unspecified osteoarthritis, unspecified site: Secondary | ICD-10-CM | POA: Diagnosis not present

## 2020-08-29 DIAGNOSIS — R5383 Other fatigue: Secondary | ICD-10-CM | POA: Diagnosis not present

## 2020-08-29 DIAGNOSIS — I10 Essential (primary) hypertension: Secondary | ICD-10-CM | POA: Insufficient documentation

## 2020-08-29 DIAGNOSIS — Z79899 Other long term (current) drug therapy: Secondary | ICD-10-CM | POA: Diagnosis not present

## 2020-08-29 DIAGNOSIS — Z8673 Personal history of transient ischemic attack (TIA), and cerebral infarction without residual deficits: Secondary | ICD-10-CM | POA: Insufficient documentation

## 2020-08-29 DIAGNOSIS — E785 Hyperlipidemia, unspecified: Secondary | ICD-10-CM | POA: Diagnosis not present

## 2020-08-29 DIAGNOSIS — T451X5A Adverse effect of antineoplastic and immunosuppressive drugs, initial encounter: Secondary | ICD-10-CM | POA: Diagnosis not present

## 2020-08-29 DIAGNOSIS — I480 Paroxysmal atrial fibrillation: Secondary | ICD-10-CM | POA: Insufficient documentation

## 2020-08-29 DIAGNOSIS — D701 Agranulocytosis secondary to cancer chemotherapy: Secondary | ICD-10-CM | POA: Insufficient documentation

## 2020-08-29 DIAGNOSIS — C3492 Malignant neoplasm of unspecified part of left bronchus or lung: Secondary | ICD-10-CM | POA: Insufficient documentation

## 2020-08-29 DIAGNOSIS — Z7901 Long term (current) use of anticoagulants: Secondary | ICD-10-CM | POA: Diagnosis not present

## 2020-08-29 DIAGNOSIS — K219 Gastro-esophageal reflux disease without esophagitis: Secondary | ICD-10-CM | POA: Insufficient documentation

## 2020-08-29 LAB — CBC WITH DIFFERENTIAL (CANCER CENTER ONLY)
Abs Immature Granulocytes: 0.01 10*3/uL (ref 0.00–0.07)
Basophils Absolute: 0 10*3/uL (ref 0.0–0.1)
Basophils Relative: 0 %
Eosinophils Absolute: 0 10*3/uL (ref 0.0–0.5)
Eosinophils Relative: 2 %
HCT: 33.2 % — ABNORMAL LOW (ref 39.0–52.0)
Hemoglobin: 10.6 g/dL — ABNORMAL LOW (ref 13.0–17.0)
Immature Granulocytes: 0 %
Lymphocytes Relative: 14 %
Lymphs Abs: 0.3 10*3/uL — ABNORMAL LOW (ref 0.7–4.0)
MCH: 28 pg (ref 26.0–34.0)
MCHC: 31.9 g/dL (ref 30.0–36.0)
MCV: 87.8 fL (ref 80.0–100.0)
Monocytes Absolute: 0.2 10*3/uL (ref 0.1–1.0)
Monocytes Relative: 9 %
Neutro Abs: 1.7 10*3/uL (ref 1.7–7.7)
Neutrophils Relative %: 75 %
Platelet Count: 82 10*3/uL — ABNORMAL LOW (ref 150–400)
RBC: 3.78 MIL/uL — ABNORMAL LOW (ref 4.22–5.81)
RDW: 14.5 % (ref 11.5–15.5)
WBC Count: 2.3 10*3/uL — ABNORMAL LOW (ref 4.0–10.5)
nRBC: 0 % (ref 0.0–0.2)

## 2020-08-29 LAB — CMP (CANCER CENTER ONLY)
ALT: 28 U/L (ref 0–44)
AST: 29 U/L (ref 15–41)
Albumin: 2.5 g/dL — ABNORMAL LOW (ref 3.5–5.0)
Alkaline Phosphatase: 86 U/L (ref 38–126)
Anion gap: 2 — ABNORMAL LOW (ref 5–15)
BUN: 20 mg/dL (ref 8–23)
CO2: 34 mmol/L — ABNORMAL HIGH (ref 22–32)
Calcium: 9 mg/dL (ref 8.9–10.3)
Chloride: 103 mmol/L (ref 98–111)
Creatinine: 1.19 mg/dL (ref 0.61–1.24)
GFR, Estimated: 59 mL/min — ABNORMAL LOW (ref >60)
Glucose, Bld: 114 mg/dL — ABNORMAL HIGH (ref 70–99)
Potassium: 4.1 mmol/L (ref 3.5–5.1)
Sodium: 139 mmol/L (ref 135–145)
Total Bilirubin: 0.6 mg/dL (ref 0.3–1.2)
Total Protein: 5.9 g/dL — ABNORMAL LOW (ref 6.5–8.1)

## 2020-08-29 NOTE — Telephone Encounter (Signed)
Postpone CYCLE 4- Cycle 4 falls on thanksgiving. Pt wants to postpone cycle 4 to the  following week due to his  family coming in for  thanksgiving and he wants to feel good.  Cycle 4 is not scheduled.

## 2020-08-29 NOTE — Telephone Encounter (Signed)
April, nurse with [facility] contacted the office requesting further instructions per patients' wife's request.  She stated that patient's PleurX drainage schedule is every other day, today it was drained.  Pleurx yielded 300 ml's today.  She stated that his wife questioned if patient should be drained daily.  Advised that Dr. Roxan Hockey stated he wanted the patient drained every other day and if drain yielded 500 ml's of fluid or more, PleurX should be drained daily.  She acknowledged receipt.

## 2020-08-30 ENCOUNTER — Other Ambulatory Visit: Payer: Self-pay | Admitting: Internal Medicine

## 2020-09-03 ENCOUNTER — Telehealth: Payer: Self-pay | Admitting: *Deleted

## 2020-09-03 ENCOUNTER — Telehealth: Payer: Self-pay | Admitting: Internal Medicine

## 2020-09-03 ENCOUNTER — Encounter: Payer: Self-pay | Admitting: *Deleted

## 2020-09-03 NOTE — Telephone Encounter (Signed)
Called patient to confirm 12/2 infusion appointment. No answer. Left message for patient with appointment information.

## 2020-09-03 NOTE — Progress Notes (Signed)
I received a message that patient would like to postpone his treatment the week of Thanksgiving.  I updated Dr. Julien Nordmann and he is ok for this treatment to be delayed until the following week.  I notified scheduling team to call and re-schedule this.

## 2020-09-03 NOTE — Telephone Encounter (Signed)
I called and updated Charles Robinson that Dr. Julien Nordmann is ok with postponing treatment the week of Thanksgiving and he will get a call from scheduling team to get set up for his 4th cycle the week following Thanksgiving. He was thankful for the call and update.

## 2020-09-03 NOTE — Progress Notes (Signed)
[facility] OFFICE PROGRESS NOTE  [name], MD [place] [address]  DIAGNOSIS: Stage IV (T3, N2, M1c)non-small celllung cancer, adenocarcinomapresented with left upper lobe lung mass with occlusion of the left upper lobe bronchus and near complete left lower lobe atelectasis in addition to mediastinal lymphadenopathy in the AP window and suspicious malignant left pleural effusion and multifocal bone metastasis diagnosed in August 2021.  BIOMARKER(S)         % CFDNA OR AMPLIFICATION        ASSOCIATED FDA-APPROVED THERAPIES         CLINICAL TRIAL AVAILABILITY [id number]* 0.2% None   Yes  [id number] 0.2% None Yes  PRIOR THERAPY: Status post left Pleurx placement for drainage of recurrent pleural effusion under the care of Dr. Roxan Hockey on 08/05/2020.  CURRENT THERAPY: Systemic chemotherapy with carboplatin for AUC of 5, Alimta 500 mg/M2 and Keytruda 200 mg IV every 3 weeks.  First dose 08/15/2020.  Status post 1 cycle.   INTERVAL HISTORY: Charles Robinson 74 y.o. male returns to the clinic today for a follow-up visit accompanied by his wife.  The patient is feeling fairly well today without any concerning complaints. The patient is status post his first cycle of treatment and he tolerated it fairly well, except for chemotherapy-induced neutropenia.  He denies any signs or symptoms of infection including fever, chills, night sweats, sore throat, skin infections, or dysuria.  Today, the patient denies any weight loss.  He denies any chest pain or hemoptysis but reports his baseline dyspnea on exertion.  The patient has a Pleurx catheter placed for his malignant pleural effusion.  The patient drains approximately 100-325 milliliters every other day. The patient denies any headache or visual changes.  He denies any rashes or skin changes. The patient denies any vomiting, diarrhea, or constipation at this time.  He reports some nausea but states his antiemetic  helps.  The patient is here today for evaluation before starting cycle #2.  MEDICAL HISTORY: Past Medical History:  Diagnosis Date  . Arthritis    back  . BPH (benign prostatic hypertrophy)   . CAD S/P multivessel PCI: LAD, RCA and extensive LCX-OM1 03/15/2012   s/p PCI to LAD, to RCA (now occluded), then extensive PCI to Dominant LCx-OM1  (enitre prox-AVG Circ for dissection in 07/2011)  . Cancer ([place])    SKIN CA  . Dyspnea   . Dysrhythmia    afib  . GERD (gastroesophageal reflux disease)   . Heart murmur   . History of colon polyps   . History of echocardiogram 08/16/2011   Echo - EF 60-65%; moderate LV concentric hypertrophy; abdnormal LV relaxation (grade 1 diastolic dysfunction; ascending aorta mildly dilated; LA moderately dilated;   . Hyperlipidemia   . Hypertension   . 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 PAF (paroxysmal atrial fibrillation) ([place]) 12/20/2012   followed by Dr. Glenetta Hew; s/p DCCV 2011, 07/2011 post PCI with Type 4a MI; CHA2DS2Vasc = 3, on Warfarin  . Paroxysmal atrial flutter ([place]) 08/20/2011   TEE- atrial septum - no defect identified; RA normal in size, no evidence of thrombus; ascending aorta normal  . Prostate cancer ([place])   . Rupture quadriceps tendon     ALLERGIES:  is allergic to lipitor [atorvastatin], morphine and related, and vicodin [hydrocodone-acetaminophen].  MEDICATIONS:  Current Outpatient Medications  Medication Sig Dispense Refill  . acetaminophen (TYLENOL) 325 MG tablet  Take 650 mg by mouth every 6 (six) hours as needed for moderate pain.    . Coenzyme Q10 200 MG capsule Take 200 mg by mouth every morning.     [name] ELIQUIS 5 MG TABS tablet TAKE 1 TABLET BY MOUTH TWICE A DAY (Patient taking differently: Take 5 mg by mouth 2 (two) times daily. ) 180 tablet 2  . ezetimibe (ZETIA) 10 MG tablet Take 1 tablet (10 mg total) by mouth daily. 90 tablet 2  .  finasteride (PROSCAR) 5 MG tablet Take 5 mg by mouth every evening.     . folic acid (FOLVITE) 1 MG tablet Take 1 tablet (1 mg total) by mouth daily. 30 tablet 4  . hydroxypropyl methylcellulose / hypromellose (ISOPTO TEARS / GONIOVISC) 2.5 % ophthalmic solution Place 1 drop into both eyes 3 (three) times daily as needed for dry eyes.    [name] Leuprolide Acetate (ELIGARD [place]) Inject into the skin.     . metoprolol tartrate (LOPRESSOR) 25 MG tablet Take 1 tablet (25 mg total) by mouth 2 (two) times daily. 180 tablet 3  . mirtazapine (REMERON) 30 MG tablet TAKE 1 TABLET BY MOUTH AT BEDTIME. 90 tablet 1  . Multiple Vitamin (MULTIVITAMIN WITH MINERALS) TABS tablet Take 1 tablet by mouth every morning.     . Omega-3 Fatty Acids (FISH OIL) 1000 MG CAPS Take 1 capsule (1,000 mg total) by mouth 2 (two) times daily. 60 capsule 0  . prochlorperazine (COMPAZINE) 10 MG tablet Take 1 tablet (10 mg total) by mouth every 6 (six) hours as needed for nausea or vomiting. 30 tablet 0  . rosuvastatin (CRESTOR) 40 MG tablet TAKE 1 TABLET BY MOUTH EVERY DAY (Patient taking differently: Take 40 mg by mouth daily. ) 90 tablet 1  . tamsulosin (FLOMAX) 0.4 MG CAPS capsule Take 0.4 mg by mouth daily.    [name] amiodarone (PACERONE) 200 MG tablet Take 1 tablet (200 mg total) by mouth daily. (Patient not taking: Reported on 09/05/2020)    . nitroGLYCERIN (NITROSTAT) 0.4 MG SL tablet PLACE 1 TABLET (0.4 MG TOTAL) UNDER THE TONGUE EVERY 5 (FIVE) MINUTES AS NEEDED. FOR CHEST PAIN (Patient not taking: Reported on 09/05/2020) 25 tablet 4   No current facility-administered medications for this visit.    SURGICAL HISTORY:  Past Surgical History:  Procedure Laterality Date  . BACK SURGERY    . CHEST TUBE INSERTION Left 08/05/2020   Procedure: INSERTION LEFT PLEURAL DRAINAGE CATHETER;  Surgeon: Melrose Nakayama, MD;  Location: [facility];  Service: Thoracic;  Laterality: Left;  . COLONOSCOPY    . CORONARY ANGIOPLASTY WITH STENT PLACEMENT   03/15/2012   multiple stens in AVGroove Circ & OM1;; 2 site PTCA w/ PTCA and stenting of OM2 ang PTCA of distal stent ISR followed by PTCA of mid LCx ISR  . CORONARY STENT PLACEMENT  2003 - 2011 - 2012 - 2013   Pre-2012 - BMS in RCA now known occluded, BMS in proximal LAD; 07/2011 - 2.25 x 23 BMS in OM 1 with significant disease on either side noted shortly after -->  2 additional overlapping DES 2.5 mm x 30 mm and 2.25 mm x 26 mm in OM1,; 3 overlapping Resolute DES in AV groove Cx crossing OM1: (Tapered from 3.8-2.6 mm)- Resolute DES 3.5 x 22, 3.0 x 38, 2.5 x 14  . DOPPLER ECHOCARDIOGRAPHY  07/2011   EF ~60-65%, Grade 1 D Dysfxn; mod Conc LVH  . ENDARTERECTOMY Left 02/27/2016   Procedure: LEFT  CAROTID ARTERY ENDARTERECTOMY ;  Surgeon: Serafina Mitchell, MD;  Location: [facility];  Service: Vascular;  Laterality: Left;  . LEFT HEART CATHETERIZATION WITH CORONARY ANGIOGRAM N/A 03/15/2012   Procedure: LEFT HEART CATHETERIZATION WITH CORONARY ANGIOGRAM;  Surgeon: [name], MD;  Location[facility] CATH LAB: patent LAD stents, patent LCx stents w/80% focal ISR just distal to OM; OM proximal stent open; -- 2 site PTCA-PCI  . LUMBAR LAMINECTOMY/DECOMPRESSION MICRODISCECTOMY Bilateral 12/11/2014   Procedure: Bilateral Lumbar Three-Four Laminectomy;  Surgeon: Kristeen Miss, MD;  Location: [facility];  Service: Neurosurgery;  Laterality: Bilateral;  bilateral  . NM MYOVIEW LTD  11/2012   No ischemia or infarction, normal EF & WM  . PATCH ANGIOPLASTY Left 02/27/2016   Procedure: WITH HEMASHIELD DACRON  PATCH ANGIOPLASTY;  Surgeon: Serafina Mitchell, MD;  Location: [facility];  Service: Vascular;  Laterality: Left;  [name] QUADRICEPS TENDON REPAIR  09/19/2012   Procedure: REPAIR QUADRICEP TENDON;  Surgeon: [name], MD;  Location: [facility];  Service: Orthopedics;  Laterality: Left;  [name] VASECTOMY      REVIEW OF SYSTEMS:   Review of Systems  Constitutional: Positive for fatigue.  Negative for appetite change, chills, fever and  unexpected weight change.  HENT: Negative for mouth sores, nosebleeds, sore throat and trouble swallowing.   Eyes: Negative for eye problems and icterus.  Respiratory: Positive for baseline cough and dyspnea on exertion.  Negative for hemoptysis and wheezing.   Cardiovascular: Negative for chest pain and leg swelling.  Gastrointestinal: Positive for (occassional) nausea. negative for abdominal pain, constipation, diarrhea, and vomiting.  Genitourinary: Negative for bladder incontinence, difficulty urinating, dysuria, frequency and hematuria.   Musculoskeletal: Negative for back pain, gait problem, neck pain and neck stiffness.  Skin: Negative for itching and rash.  Neurological: Negative for dizziness, extremity weakness, gait problem, headaches, light-headedness and seizures.  Hematological: Negative for adenopathy. Does not bruise/bleed easily.  Psychiatric/Behavioral: Negative for confusion, depression and sleep disturbance. The patient is not nervous/anxious.     PHYSICAL EXAMINATION:  Blood pressure (!) 94/59, pulse 64, temperature 97.8 F (36.6 C), temperature source Tympanic, resp. rate 18, height 6\' 3"  (1.905 m), weight 193 lb 12.8 oz (87.9 kg), SpO2 100 %.  ECOG PERFORMANCE STATUS: 2 - Symptomatic, <50% confined to bed  Physical Exam  Constitutional: Oriented to person, place, and time and well-developed, well-nourished, and in no distress.  HENT:  Head: Normocephalic and atraumatic.  Mouth/Throat: Oropharynx is clear and moist. No oropharyngeal exudate.  Eyes: Conjunctivae are normal. Right eye exhibits no discharge. Left eye exhibits no discharge. No scleral icterus.  Neck: Normal range of motion. Neck supple.  Cardiovascular: Normal rate, regular rhythm, normal heart sounds and intact distal pulses.   Pulmonary/Chest: Effort normal and breath sounds normal. No respiratory distress. No wheezes. No rales.  Abdominal: Soft. Bowel sounds are normal. Exhibits no distension and no  mass. There is no tenderness.  Musculoskeletal: Normal range of motion. Exhibits no edema.  Lymphadenopathy:    No cervical adenopathy.  Neurological: Alert and oriented to person, place, and time. Exhibits normal muscle tone. Gait normal. Coordination normal.  Skin: Skin is warm and dry. No rash noted. Not diaphoretic. No erythema. No pallor.  Psychiatric: Mood, memory and judgment normal.  Vitals reviewed.  LABORATORY DATA: Lab Results  Component Value Date   WBC 1.6 (L) 09/05/2020   HGB 10.1 (L) 09/05/2020   HCT 31.5 (L) 09/05/2020   MCV 87.3 09/05/2020   PLT 182 09/05/2020  Chemistry      Component Value Date/Time   NA 139 09/05/2020 1021   NA 140 08/15/2019 0936   K 3.8 09/05/2020 1021   CL 105 09/05/2020 1021   CO2 29 09/05/2020 1021   BUN 19 09/05/2020 1021   BUN 27 08/15/2019 0936   CREATININE 1.35 (H) 09/05/2020 1021   CREATININE 1.43 (H) 07/29/2016 0814      Component Value Date/Time   CALCIUM 8.9 09/05/2020 1021   ALKPHOS 81 09/05/2020 1021   AST 22 09/05/2020 1021   ALT 18 09/05/2020 1021   BILITOT 0.5 09/05/2020 1021       RADIOGRAPHIC STUDIES:  DG Chest 2 View  Result Date: 08/27/2020 CLINICAL DATA:  Left-sided pleural effusion EXAM: CHEST - 2 VIEW COMPARISON:  August 02, 2020. FINDINGS: There is a PleurX catheter on the left with left pleural effusion much smaller than on previous study. A small residual left pleural effusion is noted with atelectatic change in the left base. There is a suspected chronic loculated pneumothorax in the apical and apicolateral regions, stable compared to several recent studies. There is apparent scarring in the left upper lobe medially. New ill-defined nodular opacity in the medial left upper lobe is present, measuring 2.7 x 2.3 cm. Right lung is clear. Heart is upper normal in size with pulmonary vascularity normal. No adenopathy evident. Aortic atherosclerosis noted. Apparent healing fracture of the right posterior  sixth rib. IMPRESSION: Significantly smaller left pleural effusion with PleurX catheter present. Residual effusion with atelectasis left base. Question loculated pneumothorax in the right apical/apicolateral region, stable from several prior studies. No tension component. Scarring left upper lobe. Ill-defined opacity left upper lobe, new from prior studies. This finding may warrant noncontrast chest CT to further assess. Right lung clear.  Stable cardiac silhouette. Aortic Atherosclerosis (ICD10-I70.0). These results will be called to the ordering clinician or representative by the Radiologist Assistant, and communication documented in the PACS or [employer]. Electronically Signed   By: Lowella Grip III M.D.   On: 08/27/2020 09:56     ASSESSMENT/PLAN:  This is a very pleasant 74 year old Caucasian male recently diagnosed with stage IV (T3, N2, M1 C) non-small cell lung cancer, adenocarcinoma.  He presented with a left upper lobe lung mass and occlusion of the left upper lobe bronchus as well as complete left lower lobe atelectasis.  He also had mediastinal lymphadenopathy in the AP window as well as a malignant left pleural effusion and metastatic disease to the bone.  He was diagnosed in August 2021.  The patient's molecular studies by [employer] did not show any actionable mutations.  There was not enough material from his pleural effusion lead for molecular studies by foundation [facility].  The patient is currently undergoing systemic chemotherapy with carboplatin for an AUC of 5, Alimta 500 mg per metered squared, Keytruda 200 mg IV every 3 weeks.  He is status post 1 cycle.  He tolerated it well except for a few episodes of nausea which was controlled with his antiemetic.  The patient also has a Pleurx catheter which was placed under the care of Dr. Roxan Hockey in September 2021.  The patient was seen with Dr. Julien Nordmann today.  Labs were reviewed. His labs show neutropenia today. We will arrange  for granix to be given today and tomorrow. Going forward, he will receive neulasta on day 3 of every cycle to prevent neutropenia.  Will not receive treatment today and we will delay his treatment by 1 week.  We will plan on administering treatment next week as long as his labs are acceptable for treatment and his neutropenia has improved.  We will see the patient back for follow-up visit in 4 weeks for evaluation before starting cycle #3.   The patient's baseline blood pressure runs a little low.  The patient is on metoprolol as well as amiodarone is managed by cardiology. He will increase his fluid intake.   The patient was advised to call immediately if he has any concerning symptoms in the interval. The patient voices understanding of current disease status and treatment options and is in agreement with the current care plan. All questions were answered. The patient knows to call the clinic with any problems, questions or concerns. We can certainly see the patient much sooner if necessary  No orders of the defined types were placed in this encounter.    Augustine Leverette L Yousuf Ager, PA-C 09/05/20  ADDENDUM: Hematology/Oncology Attending: I had a face-to-face encounter with the patient today.  I recommended his care plan.  This is a very pleasant 74 years old white male with a stage IV non-small cell lung cancer, adenocarcinoma.  He is currently undergoing systemic chemotherapy with carboplatin, Alimta and Keytruda status post 1 cycle.  The patient tolerated the first cycle of his treatment except for fatigue and intermittent nausea. He was supposed to start cycle #2 of his treatment today but his absolute neutrophil count is below the optimal number to start the treatment. I recommended for the patient to receive 2 doses of Granix or other biosimilars today and tomorrow to improve his absolute neutrophil count before resuming his treatment next week. Starting the next cycle we will support the  patient with prophylactic Neulasta or Biosimilars to avoid the chemotherapy-induced neutropenia that have been with the cycle of the treatment especially with the patient's current comorbidities and risk factor. We will delay the start of cycle number 2 by 1 week. The patient will come back for follow-up visit in 4 weeks for evaluation with the start of cycle #3. For the nausea, he will continue with his current antiemetics. He was advised to call immediately if he has any concerning symptoms in the interval.  Disclaimer: This note was dictated with voice recognition software. Similar sounding words can inadvertently be transcribed and may be missed upon review. Eilleen Kempf, MD 09/06/20

## 2020-09-05 ENCOUNTER — Inpatient Hospital Stay: Payer: Medicare Other

## 2020-09-05 ENCOUNTER — Inpatient Hospital Stay (HOSPITAL_BASED_OUTPATIENT_CLINIC_OR_DEPARTMENT_OTHER): Payer: Medicare Other | Admitting: Physician Assistant

## 2020-09-05 ENCOUNTER — Other Ambulatory Visit: Payer: Self-pay

## 2020-09-05 VITALS — BP 94/59 | HR 64 | Temp 97.8°F | Resp 18 | Ht 75.0 in | Wt 193.8 lb

## 2020-09-05 DIAGNOSIS — R5382 Chronic fatigue, unspecified: Secondary | ICD-10-CM

## 2020-09-05 DIAGNOSIS — D701 Agranulocytosis secondary to cancer chemotherapy: Secondary | ICD-10-CM | POA: Diagnosis not present

## 2020-09-05 DIAGNOSIS — T451X5A Adverse effect of antineoplastic and immunosuppressive drugs, initial encounter: Secondary | ICD-10-CM | POA: Insufficient documentation

## 2020-09-05 DIAGNOSIS — C3492 Malignant neoplasm of unspecified part of left bronchus or lung: Secondary | ICD-10-CM

## 2020-09-05 DIAGNOSIS — I6523 Occlusion and stenosis of bilateral carotid arteries: Secondary | ICD-10-CM | POA: Diagnosis not present

## 2020-09-05 LAB — CBC WITH DIFFERENTIAL (CANCER CENTER ONLY)
Abs Immature Granulocytes: 0.01 10*3/uL (ref 0.00–0.07)
Basophils Absolute: 0 10*3/uL (ref 0.0–0.1)
Basophils Relative: 1 %
Eosinophils Absolute: 0 10*3/uL (ref 0.0–0.5)
Eosinophils Relative: 1 %
HCT: 31.5 % — ABNORMAL LOW (ref 39.0–52.0)
Hemoglobin: 10.1 g/dL — ABNORMAL LOW (ref 13.0–17.0)
Immature Granulocytes: 1 %
Lymphocytes Relative: 17 %
Lymphs Abs: 0.3 10*3/uL — ABNORMAL LOW (ref 0.7–4.0)
MCH: 28 pg (ref 26.0–34.0)
MCHC: 32.1 g/dL (ref 30.0–36.0)
MCV: 87.3 fL (ref 80.0–100.0)
Monocytes Absolute: 0.3 10*3/uL (ref 0.1–1.0)
Monocytes Relative: 21 %
Neutro Abs: 0.9 10*3/uL — ABNORMAL LOW (ref 1.7–7.7)
Neutrophils Relative %: 59 %
Platelet Count: 182 10*3/uL (ref 150–400)
RBC: 3.61 MIL/uL — ABNORMAL LOW (ref 4.22–5.81)
RDW: 16 % — ABNORMAL HIGH (ref 11.5–15.5)
WBC Count: 1.6 10*3/uL — ABNORMAL LOW (ref 4.0–10.5)
nRBC: 0 % (ref 0.0–0.2)

## 2020-09-05 LAB — CMP (CANCER CENTER ONLY)
ALT: 18 U/L (ref 0–44)
AST: 22 U/L (ref 15–41)
Albumin: 2.5 g/dL — ABNORMAL LOW (ref 3.5–5.0)
Alkaline Phosphatase: 81 U/L (ref 38–126)
Anion gap: 5 (ref 5–15)
BUN: 19 mg/dL (ref 8–23)
CO2: 29 mmol/L (ref 22–32)
Calcium: 8.9 mg/dL (ref 8.9–10.3)
Chloride: 105 mmol/L (ref 98–111)
Creatinine: 1.35 mg/dL — ABNORMAL HIGH (ref 0.61–1.24)
GFR, Estimated: 51 mL/min — ABNORMAL LOW (ref >60)
Glucose, Bld: 112 mg/dL — ABNORMAL HIGH (ref 70–99)
Potassium: 3.8 mmol/L (ref 3.5–5.1)
Sodium: 139 mmol/L (ref 135–145)
Total Bilirubin: 0.5 mg/dL (ref 0.3–1.2)
Total Protein: 5.9 g/dL — ABNORMAL LOW (ref 6.5–8.1)

## 2020-09-05 LAB — TSH: TSH: 2.957 u[IU]/mL (ref 0.320–4.118)

## 2020-09-05 MED ORDER — FILGRASTIM-SNDZ 300 MCG/0.5ML IJ SOSY
PREFILLED_SYRINGE | INTRAMUSCULAR | Status: AC
  Filled 2020-09-05: qty 0.5

## 2020-09-05 MED ORDER — TBO-FILGRASTIM 300 MCG/0.5ML ~~LOC~~ SOSY: 300.0000 ug | PREFILLED_SYRINGE | Freq: Once | SUBCUTANEOUS | Status: DC

## 2020-09-05 MED ORDER — FILGRASTIM-SNDZ 300 MCG/0.5ML IJ SOSY
300.0000 ug | PREFILLED_SYRINGE | Freq: Once | INTRAMUSCULAR | Status: AC
  Administered 2020-09-05: 300 ug via SUBCUTANEOUS

## 2020-09-05 NOTE — Patient Instructions (Signed)

## 2020-09-06 ENCOUNTER — Encounter: Payer: Self-pay | Admitting: Physician Assistant

## 2020-09-06 ENCOUNTER — Other Ambulatory Visit: Payer: Self-pay

## 2020-09-06 ENCOUNTER — Telehealth: Payer: Self-pay | Admitting: Physician Assistant

## 2020-09-06 ENCOUNTER — Inpatient Hospital Stay: Payer: Medicare Other

## 2020-09-06 VITALS — BP 100/64 | HR 76 | Temp 98.4°F | Resp 18

## 2020-09-06 DIAGNOSIS — D701 Agranulocytosis secondary to cancer chemotherapy: Secondary | ICD-10-CM

## 2020-09-06 DIAGNOSIS — C3492 Malignant neoplasm of unspecified part of left bronchus or lung: Secondary | ICD-10-CM | POA: Diagnosis not present

## 2020-09-06 MED ORDER — FILGRASTIM-SNDZ 300 MCG/0.5ML IJ SOSY
300.0000 ug | PREFILLED_SYRINGE | Freq: Once | INTRAMUSCULAR | Status: AC
  Administered 2020-09-06: 300 ug via SUBCUTANEOUS

## 2020-09-06 MED ORDER — FILGRASTIM-SNDZ 300 MCG/0.5ML IJ SOSY
PREFILLED_SYRINGE | INTRAMUSCULAR | Status: AC
  Filled 2020-09-06: qty 0.5

## 2020-09-06 NOTE — Telephone Encounter (Signed)
Scheduled per 10/14 los. Spoke with pt's wife and is aware of appt times and dates. Noted to give pt appt calendar on next visit.

## 2020-09-07 ENCOUNTER — Ambulatory Visit: Payer: Medicare Other

## 2020-09-08 ENCOUNTER — Other Ambulatory Visit: Payer: Self-pay | Admitting: Internal Medicine

## 2020-09-12 ENCOUNTER — Inpatient Hospital Stay: Payer: Medicare Other

## 2020-09-12 ENCOUNTER — Telehealth: Payer: Self-pay

## 2020-09-12 ENCOUNTER — Other Ambulatory Visit: Payer: Self-pay

## 2020-09-12 VITALS — BP 102/64 | HR 72 | Temp 98.2°F | Resp 17

## 2020-09-12 DIAGNOSIS — C3492 Malignant neoplasm of unspecified part of left bronchus or lung: Secondary | ICD-10-CM | POA: Diagnosis not present

## 2020-09-12 LAB — CBC WITH DIFFERENTIAL (CANCER CENTER ONLY)
Abs Immature Granulocytes: 0.09 10*3/uL — ABNORMAL HIGH (ref 0.00–0.07)
Basophils Absolute: 0 10*3/uL (ref 0.0–0.1)
Basophils Relative: 0 %
Eosinophils Absolute: 0 10*3/uL (ref 0.0–0.5)
Eosinophils Relative: 0 %
HCT: 31.7 % — ABNORMAL LOW (ref 39.0–52.0)
Hemoglobin: 10 g/dL — ABNORMAL LOW (ref 13.0–17.0)
Immature Granulocytes: 2 %
Lymphocytes Relative: 11 %
Lymphs Abs: 0.5 10*3/uL — ABNORMAL LOW (ref 0.7–4.0)
MCH: 28.4 pg (ref 26.0–34.0)
MCHC: 31.5 g/dL (ref 30.0–36.0)
MCV: 90.1 fL (ref 80.0–100.0)
Monocytes Absolute: 0.6 10*3/uL (ref 0.1–1.0)
Monocytes Relative: 13 %
Neutro Abs: 3.6 10*3/uL (ref 1.7–7.7)
Neutrophils Relative %: 74 %
Platelet Count: 166 10*3/uL (ref 150–400)
RBC: 3.52 MIL/uL — ABNORMAL LOW (ref 4.22–5.81)
RDW: 17.2 % — ABNORMAL HIGH (ref 11.5–15.5)
WBC Count: 4.8 10*3/uL (ref 4.0–10.5)
nRBC: 0 % (ref 0.0–0.2)

## 2020-09-12 LAB — CMP (CANCER CENTER ONLY)
ALT: 19 U/L (ref 0–44)
AST: 23 U/L (ref 15–41)
Albumin: 2.5 g/dL — ABNORMAL LOW (ref 3.5–5.0)
Alkaline Phosphatase: 103 U/L (ref 38–126)
Anion gap: 7 (ref 5–15)
BUN: 19 mg/dL (ref 8–23)
CO2: 30 mmol/L (ref 22–32)
Calcium: 8.8 mg/dL — ABNORMAL LOW (ref 8.9–10.3)
Chloride: 104 mmol/L (ref 98–111)
Creatinine: 1.41 mg/dL — ABNORMAL HIGH (ref 0.61–1.24)
GFR, Estimated: 52 mL/min — ABNORMAL LOW (ref >60)
Glucose, Bld: 115 mg/dL — ABNORMAL HIGH (ref 70–99)
Potassium: 3.7 mmol/L (ref 3.5–5.1)
Sodium: 141 mmol/L (ref 135–145)
Total Bilirubin: 0.5 mg/dL (ref 0.3–1.2)
Total Protein: 5.9 g/dL — ABNORMAL LOW (ref 6.5–8.1)

## 2020-09-12 MED ORDER — SODIUM CHLORIDE 0.9 % IV SOLN
Freq: Once | INTRAVENOUS | Status: AC
  Filled 2020-09-12: qty 250

## 2020-09-12 MED ORDER — PALONOSETRON HCL INJECTION 0.25 MG/5ML
INTRAVENOUS | Status: AC
  Filled 2020-09-12: qty 5

## 2020-09-12 MED ORDER — SODIUM CHLORIDE 0.9 % IV SOLN
500.0000 mg/m2 | Freq: Once | INTRAVENOUS | Status: AC
  Administered 2020-09-12: 1100 mg via INTRAVENOUS
  Filled 2020-09-12: qty 40

## 2020-09-12 MED ORDER — PALONOSETRON HCL INJECTION 0.25 MG/5ML
0.2500 mg | Freq: Once | INTRAVENOUS | Status: AC
  Administered 2020-09-12: 0.25 mg via INTRAVENOUS

## 2020-09-12 MED ORDER — SODIUM CHLORIDE 0.9 % IV SOLN
200.0000 mg | Freq: Once | INTRAVENOUS | Status: AC
  Administered 2020-09-12: 200 mg via INTRAVENOUS
  Filled 2020-09-12: qty 8

## 2020-09-12 MED ORDER — SODIUM CHLORIDE 0.9 % IV SOLN
150.0000 mg | Freq: Once | INTRAVENOUS | Status: AC
  Administered 2020-09-12: 150 mg via INTRAVENOUS
  Filled 2020-09-12: qty 150

## 2020-09-12 MED ORDER — SODIUM CHLORIDE 0.9 % IV SOLN
10.0000 mg | Freq: Once | INTRAVENOUS | Status: AC
  Administered 2020-09-12: 10 mg via INTRAVENOUS
  Filled 2020-09-12: qty 10

## 2020-09-12 MED ORDER — SODIUM CHLORIDE 0.9 % IV SOLN
414.5000 mg | Freq: Once | INTRAVENOUS | Status: AC
  Administered 2020-09-12: 410 mg via INTRAVENOUS
  Filled 2020-09-12: qty 41

## 2020-09-12 NOTE — Telephone Encounter (Signed)
Charles Robinson, PT with [facility] [phone] contacted the office for verbal orders for home health Physical Therapy.  Patient is s/p Left Pleurx catheter placement with Dr. Roxan Hockey 08/05/20.  Advised to refer to patient's PCP for PT orders.  She acknowledged receipt.

## 2020-09-12 NOTE — Patient Instructions (Signed)
[facility] Discharge Instructions for Patients Receiving Chemotherapy  Today you received the following chemotherapy agents: Keytruda, Alimta, Carboplatin   To help prevent nausea and vomiting after your treatment, we encourage you to take your nausea medication as directed.    If you develop nausea and vomiting that is not controlled by your nausea medication, call the clinic.   BELOW ARE SYMPTOMS THAT SHOULD BE REPORTED IMMEDIATELY:  *FEVER GREATER THAN 100.5 F  *CHILLS WITH OR WITHOUT FEVER  NAUSEA AND VOMITING THAT IS NOT CONTROLLED WITH YOUR NAUSEA MEDICATION  *UNUSUAL SHORTNESS OF BREATH  *UNUSUAL BRUISING OR BLEEDING  TENDERNESS IN MOUTH AND THROAT WITH OR WITHOUT PRESENCE OF ULCERS  *URINARY PROBLEMS  *BOWEL PROBLEMS  UNUSUAL RASH Items with * indicate a potential emergency and should be followed up as soon as possible.  Feel free to call the clinic should you have any questions or concerns. The clinic phone number is [phone].  Please show the [facility] at check-in to the Emergency Department and triage nurse.  Pembrolizumab injection What is this medicine? PEMBROLIZUMAB (pem broe liz ue mab) is a monoclonal antibody. It is used to treat certain types of cancer. This medicine may be used for other purposes; ask your health care provider or pharmacist if you have questions. COMMON BRAND NAME(S): Keytruda What should I tell my health care provider before I take this medicine? They need to know if you have any of these conditions:  diabetes  immune system problems  inflammatory bowel disease  liver disease  lung or breathing disease  lupus  received or scheduled to receive an organ transplant or a stem-cell transplant that uses donor stem cells  an unusual or allergic reaction to pembrolizumab, other medicines, foods, dyes, or preservatives  pregnant or trying to get pregnant  breast-feeding How should I use this  medicine? This medicine is for infusion into a vein. It is given by a health care professional in a hospital or clinic setting. A special MedGuide will be given to you before each treatment. Be sure to read this information carefully each time. Talk to your pediatrician regarding the use of this medicine in children. While this drug may be prescribed for children as young as 6 months for selected conditions, precautions do apply. Overdosage: If you think you have taken too much of this medicine contact a poison control center or emergency room at once. NOTE: This medicine is only for you. Do not share this medicine with others. What if I miss a dose? It is important not to miss your dose. Call your doctor or health care professional if you are unable to keep an appointment. What may interact with this medicine? Interactions have not been studied. Give your health care provider a list of all the medicines, herbs, non-prescription drugs, or dietary supplements you use. Also tell them if you smoke, drink alcohol, or use illegal drugs. Some items may interact with your medicine. This list may not describe all possible interactions. Give your health care provider a list of all the medicines, herbs, non-prescription drugs, or dietary supplements you use. Also tell them if you smoke, drink alcohol, or use illegal drugs. Some items may interact with your medicine. What should I watch for while using this medicine? Your condition will be monitored carefully while you are receiving this medicine. You may need blood work done while you are taking this medicine. Do not become pregnant while taking this medicine or for 4 months after  stopping it. Women should inform their doctor if they wish to become pregnant or think they might be pregnant. There is a potential for serious side effects to an unborn child. Talk to your health care professional or pharmacist for more information. Do not breast-feed an infant while  taking this medicine or for 4 months after the last dose. What side effects may I notice from receiving this medicine? Side effects that you should report to your doctor or health care professional as soon as possible:  allergic reactions like skin rash, itching or hives, swelling of the face, lips, or tongue  bloody or black, tarry  breathing problems  changes in vision  chest pain  chills  confusion  constipation  cough  diarrhea  dizziness or feeling faint or lightheaded  fast or irregular heartbeat  fever  flushing  joint pain  low blood counts - this medicine may decrease the number of white blood cells, red blood cells and platelets. You may be at increased risk for infections and bleeding.  muscle pain  muscle weakness  pain, tingling, numbness in the hands or feet  persistent headache  redness, blistering, peeling or loosening of the skin, including inside the mouth  signs and symptoms of high blood sugar such as dizziness; dry mouth; dry skin; fruity breath; nausea; stomach pain; increased hunger or thirst; increased urination  signs and symptoms of kidney injury like trouble passing urine or change in the amount of urine  signs and symptoms of liver injury like dark urine, light-colored stools, loss of appetite, nausea, right upper belly pain, yellowing of the eyes or skin  sweating  swollen lymph nodes  weight loss Side effects that usually do not require medical attention (report to your doctor or health care professional if they continue or are bothersome):  decreased appetite  hair loss  muscle pain  tiredness This list may not describe all possible side effects. Call your doctor for medical advice about side effects. You may report side effects to FDA at 1-800-FDA-1088. Where should I keep my medicine? This drug is given in a hospital or clinic and will not be stored at home. NOTE: This sheet is a summary. It may not cover all  possible information. If you have questions about this medicine, talk to your doctor, pharmacist, or health care provider.  2020 Elsevier/Gold Standard (2019-09-15 18:07:58)  Pemetrexed injection What is this medicine? PEMETREXED (PEM e TREX ed) is a chemotherapy drug used to treat lung cancers like non-small cell lung cancer and mesothelioma. It may also be used to treat other cancers. This medicine may be used for other purposes; ask your health care provider or pharmacist if you have questions. COMMON BRAND NAME(S): Alimta What should I tell my health care provider before I take this medicine? They need to know if you have any of these conditions:  infection (especially a virus infection such as chickenpox, cold sores, or herpes)  kidney disease  low blood counts, like low white cell, platelet, or red cell counts  lung or breathing disease, like asthma  radiation therapy  an unusual or allergic reaction to pemetrexed, other medicines, foods, dyes, or preservative  pregnant or trying to get pregnant  breast-feeding How should I use this medicine? This drug is given as an infusion into a vein. It is administered in a hospital or clinic by a specially trained health care professional. Talk to your pediatrician regarding the use of this medicine in children. Special care may be needed.  Overdosage: If you think you have taken too much of this medicine contact a poison control center or emergency room at once. NOTE: This medicine is only for you. Do not share this medicine with others. What if I miss a dose? It is important not to miss your dose. Call your doctor or health care professional if you are unable to keep an appointment. What may interact with this medicine? This medicine may interact with the following medications:  Ibuprofen This list may not describe all possible interactions. Give your health care provider a list of all the medicines, herbs, non-prescription drugs,  or dietary supplements you use. Also tell them if you smoke, drink alcohol, or use illegal drugs. Some items may interact with your medicine. What should I watch for while using this medicine? Visit your doctor for checks on your progress. This drug may make you feel generally unwell. This is not uncommon, as chemotherapy can affect healthy cells as well as cancer cells. Report any side effects. Continue your course of treatment even though you feel ill unless your doctor tells you to stop. In some cases, you may be given additional medicines to help with side effects. Follow all directions for their use. Call your doctor or health care professional for advice if you get a fever, chills or sore throat, or other symptoms of a cold or flu. Do not treat yourself. This drug decreases your body's ability to fight infections. Try to avoid being around people who are sick. This medicine may increase your risk to bruise or bleed. Call your doctor or health care professional if you notice any unusual bleeding. Be careful brushing and flossing your teeth or using a toothpick because you may get an infection or bleed more easily. If you have any dental work done, tell your dentist you are receiving this medicine. Avoid taking products that contain aspirin, acetaminophen, ibuprofen, naproxen, or ketoprofen unless instructed by your doctor. These medicines may hide a fever. Call your doctor or health care professional if you get diarrhea or mouth sores. Do not treat yourself. To protect your kidneys, drink water or other fluids as directed while you are taking this medicine. Do not become pregnant while taking this medicine or for 6 months after stopping it. Women should inform their doctor if they wish to become pregnant or think they might be pregnant. Men should not father a child while taking this medicine and for 3 months after stopping it. This may interfere with the ability to father a child. You should talk to  your doctor or health care professional if you are concerned about your fertility. There is a potential for serious side effects to an unborn child. Talk to your health care professional or pharmacist for more information. Do not breast-feed an infant while taking this medicine or for 1 week after stopping it. What side effects may I notice from receiving this medicine? Side effects that you should report to your doctor or health care professional as soon as possible:  allergic reactions like skin rash, itching or hives, swelling of the face, lips, or tongue  breathing problems  redness, blistering, peeling or loosening of the skin, including inside the mouth  signs and symptoms of bleeding such as bloody or black, tarry stools; red or dark-brown urine; spitting up blood or brown material that looks like coffee grounds; red spots on the skin; unusual bruising or bleeding from the eye, gums, or nose  signs and symptoms of infection like fever or  chills; cough; sore throat; pain or trouble passing urine  signs and symptoms of kidney injury like trouble passing urine or change in the amount of urine  signs and symptoms of liver injury like dark yellow or brown urine; general ill feeling or flu-like symptoms; light-colored stools; loss of appetite; nausea; right upper belly pain; unusually weak or tired; yellowing of the eyes or skin Side effects that usually do not require medical attention (report to your doctor or health care professional if they continue or are bothersome):  constipation  mouth sores  nausea, vomiting  unusually weak or tired This list may not describe all possible side effects. Call your doctor for medical advice about side effects. You may report side effects to FDA at 1-800-FDA-1088. Where should I keep my medicine? This drug is given in a hospital or clinic and will not be stored at home. NOTE: This sheet is a summary. It may not cover all possible information. If  you have questions about this medicine, talk to your doctor, pharmacist, or health care provider.  2020 Elsevier/Gold Standard (2017-12-29 16:11:33)  Carboplatin injection What is this medicine? CARBOPLATIN (KAR boe pla tin) is a chemotherapy drug. It targets fast dividing cells, like cancer cells, and causes these cells to die. This medicine is used to treat ovarian cancer and many other cancers. This medicine may be used for other purposes; ask your health care provider or pharmacist if you have questions. COMMON BRAND NAME(S): Paraplatin What should I tell my health care provider before I take this medicine? They need to know if you have any of these conditions:  blood disorders  hearing problems  kidney disease  recent or ongoing radiation therapy  an unusual or allergic reaction to carboplatin, cisplatin, other chemotherapy, other medicines, foods, dyes, or preservatives  pregnant or trying to get pregnant  breast-feeding How should I use this medicine? This drug is usually given as an infusion into a vein. It is administered in a hospital or clinic by a specially trained health care professional. Talk to your pediatrician regarding the use of this medicine in children. Special care may be needed. Overdosage: If you think you have taken too much of this medicine contact a poison control center or emergency room at once. NOTE: This medicine is only for you. Do not share this medicine with others. What if I miss a dose? It is important not to miss a dose. Call your doctor or health care professional if you are unable to keep an appointment. What may interact with this medicine?  medicines for seizures  medicines to increase blood counts like filgrastim, pegfilgrastim, sargramostim  some antibiotics like amikacin, gentamicin, neomycin, streptomycin, tobramycin  vaccines Talk to your doctor or health care professional before taking any of these  medicines:  acetaminophen  aspirin  ibuprofen  ketoprofen  naproxen This list may not describe all possible interactions. Give your health care provider a list of all the medicines, herbs, non-prescription drugs, or dietary supplements you use. Also tell them if you smoke, drink alcohol, or use illegal drugs. Some items may interact with your medicine. What should I watch for while using this medicine? Your condition will be monitored carefully while you are receiving this medicine. You will need important blood work done while you are taking this medicine. This drug may make you feel generally unwell. This is not uncommon, as chemotherapy can affect healthy cells as well as cancer cells. Report any side effects. Continue your course of treatment even  though you feel ill unless your doctor tells you to stop. In some cases, you may be given additional medicines to help with side effects. Follow all directions for their use. Call your doctor or health care professional for advice if you get a fever, chills or sore throat, or other symptoms of a cold or flu. Do not treat yourself. This drug decreases your body's ability to fight infections. Try to avoid being around people who are sick. This medicine may increase your risk to bruise or bleed. Call your doctor or health care professional if you notice any unusual bleeding. Be careful brushing and flossing your teeth or using a toothpick because you may get an infection or bleed more easily. If you have any dental work done, tell your dentist you are receiving this medicine. Avoid taking products that contain aspirin, acetaminophen, ibuprofen, naproxen, or ketoprofen unless instructed by your doctor. These medicines may hide a fever. Do not become pregnant while taking this medicine. Women should inform their doctor if they wish to become pregnant or think they might be pregnant. There is a potential for serious side effects to an unborn child. Talk  to your health care professional or pharmacist for more information. Do not breast-feed an infant while taking this medicine. What side effects may I notice from receiving this medicine? Side effects that you should report to your doctor or health care professional as soon as possible:  allergic reactions like skin rash, itching or hives, swelling of the face, lips, or tongue  signs of infection - fever or chills, cough, sore throat, pain or difficulty passing urine  signs of decreased platelets or bleeding - bruising, pinpoint red spots on the skin, black, tarry stools, nosebleeds  signs of decreased red blood cells - unusually weak or tired, fainting spells, lightheadedness  breathing problems  changes in hearing  changes in vision  chest pain  high blood pressure  low blood counts - This drug may decrease the number of white blood cells, red blood cells and platelets. You may be at increased risk for infections and bleeding.  nausea and vomiting  pain, swelling, redness or irritation at the injection site  pain, tingling, numbness in the hands or feet  problems with balance, talking, walking  trouble passing urine or change in the amount of urine Side effects that usually do not require medical attention (report to your doctor or health care professional if they continue or are bothersome):  hair loss  loss of appetite  metallic taste in the mouth or changes in taste This list may not describe all possible side effects. Call your doctor for medical advice about side effects. You may report side effects to FDA at 1-800-FDA-1088. Where should I keep my medicine? This drug is given in a hospital or clinic and will not be stored at home. NOTE: This sheet is a summary. It may not cover all possible information. If you have questions about this medicine, talk to your doctor, pharmacist, or health care provider.  2020 Elsevier/Gold Standard (2008-02-14 14:38:05)

## 2020-09-12 NOTE — Progress Notes (Signed)
The following biosimilar Udenyca (pegfilgrastim-cbqv) has been selected for use in this patient.  Kennith Center, Pharm.D., CPP 09/12/2020@5 :05 PM

## 2020-09-14 ENCOUNTER — Other Ambulatory Visit: Payer: Self-pay

## 2020-09-14 ENCOUNTER — Inpatient Hospital Stay: Payer: Medicare Other

## 2020-09-14 VITALS — BP 134/62 | HR 71 | Temp 96.4°F | Resp 18

## 2020-09-14 DIAGNOSIS — C3492 Malignant neoplasm of unspecified part of left bronchus or lung: Secondary | ICD-10-CM

## 2020-09-14 MED ORDER — PEGFILGRASTIM-CBQV 6 MG/0.6ML ~~LOC~~ SOSY
6.0000 mg | PREFILLED_SYRINGE | Freq: Once | SUBCUTANEOUS | Status: AC
  Administered 2020-09-14: 6 mg via SUBCUTANEOUS

## 2020-09-15 ENCOUNTER — Other Ambulatory Visit: Payer: Self-pay | Admitting: Cardiology

## 2020-09-18 ENCOUNTER — Other Ambulatory Visit: Payer: Self-pay | Admitting: Physician Assistant

## 2020-09-18 ENCOUNTER — Telehealth: Payer: Self-pay

## 2020-09-18 DIAGNOSIS — R059 Cough, unspecified: Secondary | ICD-10-CM

## 2020-09-18 MED ORDER — HYDROCODONE-HOMATROPINE 5-1.5 MG/5ML PO SYRP: 5.0000 mL | ORAL_SOLUTION | Freq: Four times a day (QID) | ORAL | 0 refills | Status: DC | PRN

## 2020-09-18 NOTE — Telephone Encounter (Addendum)
Pt LM stating he has a very bad cough and wants to know what he can take for it.  Discussed with Cassie, PA-C who advised she will send in a rx of Hycodan and Dr. Julien Nordmann does not need to see the pt.  I spoke to pt and advised as indicated. He expressed understanding of this information.

## 2020-09-19 ENCOUNTER — Inpatient Hospital Stay: Payer: Medicare Other

## 2020-09-19 ENCOUNTER — Ambulatory Visit: Payer: Medicare Other | Admitting: Internal Medicine

## 2020-09-19 ENCOUNTER — Other Ambulatory Visit: Payer: Self-pay

## 2020-09-19 DIAGNOSIS — C3492 Malignant neoplasm of unspecified part of left bronchus or lung: Secondary | ICD-10-CM | POA: Diagnosis not present

## 2020-09-19 LAB — CBC WITH DIFFERENTIAL (CANCER CENTER ONLY)
Abs Immature Granulocytes: 0.04 10*3/uL (ref 0.00–0.07)
Basophils Absolute: 0 10*3/uL (ref 0.0–0.1)
Basophils Relative: 1 %
Eosinophils Absolute: 0 10*3/uL (ref 0.0–0.5)
Eosinophils Relative: 1 %
HCT: 31.9 % — ABNORMAL LOW (ref 39.0–52.0)
Hemoglobin: 10.1 g/dL — ABNORMAL LOW (ref 13.0–17.0)
Immature Granulocytes: 1 %
Lymphocytes Relative: 3 %
Lymphs Abs: 0.2 10*3/uL — ABNORMAL LOW (ref 0.7–4.0)
MCH: 28.7 pg (ref 26.0–34.0)
MCHC: 31.7 g/dL (ref 30.0–36.0)
MCV: 90.6 fL (ref 80.0–100.0)
Monocytes Absolute: 0.7 10*3/uL (ref 0.1–1.0)
Monocytes Relative: 10 %
Neutro Abs: 5.6 10*3/uL (ref 1.7–7.7)
Neutrophils Relative %: 84 %
Platelet Count: 104 10*3/uL — ABNORMAL LOW (ref 150–400)
RBC: 3.52 MIL/uL — ABNORMAL LOW (ref 4.22–5.81)
RDW: 17.5 % — ABNORMAL HIGH (ref 11.5–15.5)
WBC Count: 6.5 10*3/uL (ref 4.0–10.5)
nRBC: 0 % (ref 0.0–0.2)

## 2020-09-19 LAB — CMP (CANCER CENTER ONLY)
ALT: 16 U/L (ref 0–44)
AST: 23 U/L (ref 15–41)
Albumin: 2.5 g/dL — ABNORMAL LOW (ref 3.5–5.0)
Alkaline Phosphatase: 139 U/L — ABNORMAL HIGH (ref 38–126)
Anion gap: 8 (ref 5–15)
BUN: 15 mg/dL (ref 8–23)
CO2: 30 mmol/L (ref 22–32)
Calcium: 9 mg/dL (ref 8.9–10.3)
Chloride: 102 mmol/L (ref 98–111)
Creatinine: 1.21 mg/dL (ref 0.61–1.24)
GFR, Estimated: >60 mL/min (ref >60)
Glucose, Bld: 108 mg/dL — ABNORMAL HIGH (ref 70–99)
Potassium: 3.6 mmol/L (ref 3.5–5.1)
Sodium: 140 mmol/L (ref 135–145)
Total Bilirubin: 0.7 mg/dL (ref 0.3–1.2)
Total Protein: 6.1 g/dL — ABNORMAL LOW (ref 6.5–8.1)

## 2020-09-20 ENCOUNTER — Ambulatory Visit
Admission: RE | Admit: 2020-09-20 | Discharge: 2020-09-20 | Disposition: A | Discharge status: Home / Self Care | Payer: Medicare Other | Source: Ambulatory Visit | Attending: Surgical | Admitting: Surgical

## 2020-09-20 ENCOUNTER — Telehealth: Payer: Self-pay | Admitting: *Deleted

## 2020-09-20 ENCOUNTER — Other Ambulatory Visit: Payer: Self-pay | Admitting: Thoracic Surgery (Cardiothoracic Vascular Surgery)

## 2020-09-20 ENCOUNTER — Other Ambulatory Visit: Payer: Self-pay | Admitting: *Deleted

## 2020-09-20 ENCOUNTER — Ambulatory Visit (INDEPENDENT_AMBULATORY_CARE_PROVIDER_SITE_OTHER): Payer: Medicare Other | Admitting: Surgical

## 2020-09-20 VITALS — BP 107/60 | HR 76 | Resp 18 | Ht 75.0 in | Wt 192.0 lb

## 2020-09-20 DIAGNOSIS — J90 Pleural effusion, not elsewhere classified: Secondary | ICD-10-CM

## 2020-09-20 NOTE — Assessment & Plan Note (Signed)
Obtain CT to evaluate for loculation

## 2020-09-20 NOTE — Patient Instructions (Signed)
Instructed to call the office for any worsening of symptoms, specifically shortness of breath.  We will obtain a CT of the chest to better evaluate left chest.

## 2020-09-20 NOTE — Progress Notes (Signed)
[address]       [place] [address]             [phone]      Charles Robinson [facility] Medical Record #[id number] Date of Birth: [birth year]  Referring: [name], MD Primary Care: [name], MD Primary Cardiologist: Glenetta Hew, MD   Chief Complaint:   POST OP FOLLOW UP  Complains of increased cough  History of Present Illness:    The patient is seen in the office on today's date due to increased cough and decreased drainage from the Pleurx catheter.  4 days ago drainage was 450 cc but only minimal drainage was obtained today.  He denies significant shortness of breath but does note that his cough is worse than normal.  He received some antitussive medication from oncology yesterday and states there is some improvement.  He contacted the office and we asked him to come in for further evaluation.  Chest x-ray is reviewed and as described below.  He is not having fevers, chills or other significant constitutional symptoms.  Oxygen saturations are 95% on room air.  His vital signs are otherwise stable.      Past Medical History:  Diagnosis Date  . Arthritis    back  . BPH (benign prostatic hypertrophy)   . CAD S/P multivessel PCI: LAD, RCA and extensive LCX-OM1 03/15/2012   s/p PCI to LAD, to RCA (now occluded), then extensive PCI to Dominant LCx-OM1  (enitre prox-AVG Circ for dissection in 07/2011)  . Cancer ([place])    SKIN CA  . Dyspnea   . Dysrhythmia    afib  . GERD (gastroesophageal reflux disease)   . Heart murmur   . History of colon polyps   . History of echocardiogram 08/16/2011   Echo - EF 60-65%; moderate LV concentric hypertrophy; abdnormal LV relaxation (grade 1 diastolic dysfunction; ascending aorta mildly dilated; LA moderately dilated;   . Hyperlipidemia   . Hypertension   . 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 PAF (paroxysmal atrial  fibrillation) ([place]) 12/20/2012   followed by Dr. Glenetta Hew; s/p DCCV 2011, 07/2011 post PCI with Type 4a MI; CHA2DS2Vasc = 3, on Warfarin  . Paroxysmal atrial flutter ([place]) 08/20/2011   TEE- atrial septum - no defect identified; RA normal in size, no evidence of thrombus; ascending aorta normal  . Prostate cancer ([place])   . Rupture quadriceps tendon      Social History   Tobacco Use  Smoking Status Never Smoker  Smokeless Tobacco Never Used    Social History   Substance and Sexual Activity  Alcohol Use Yes  . Alcohol/week: 2.0 standard drinks  . Types: 2 Cans of beer per week   Comment: rare     Allergies  Allergen Reactions  . Lipitor [Atorvastatin] Other (See Comments)    Increased HR is currently tolerating --   . Morphine And Related Nausea And Vomiting  . Vicodin [Hydrocodone-Acetaminophen] Other (See Comments)    Feel crazy    Current Outpatient Medications  Medication Sig Dispense Refill  . acetaminophen (TYLENOL) 325 MG tablet Take 650 mg by mouth every 6 (six) hours as needed for moderate pain.    [name] amiodarone (PACERONE) 200 MG tablet Take 1 tablet (200 mg total) by mouth daily.    . Coenzyme Q10 200 MG capsule Take 200 mg by mouth every morning.     [name]  ELIQUIS 5 MG TABS tablet TAKE 1 TABLET BY MOUTH TWICE A DAY (Patient taking differently: Take 5 mg by mouth 2 (two) times daily. ) 180 tablet 2  . ezetimibe (ZETIA) 10 MG tablet Take 1 tablet (10 mg total) by mouth daily. 90 tablet 2  . finasteride (PROSCAR) 5 MG tablet Take 5 mg by mouth every evening.     . folic acid (FOLVITE) 1 MG tablet Take 1 tablet (1 mg total) by mouth daily. 30 tablet 4  . HYDROcodone-homatropine (HYCODAN) 5-1.5 MG/5ML syrup Take 5 mLs by mouth every 6 (six) hours as needed for cough. 120 mL 0  . hydroxypropyl methylcellulose / hypromellose (ISOPTO TEARS / GONIOVISC) 2.5 % ophthalmic solution Place 1 drop into both eyes 3 (three) times daily as needed for dry eyes.    [name] Leuprolide  Acetate (ELIGARD [place]) Inject into the skin.     . metoprolol tartrate (LOPRESSOR) 25 MG tablet Take 1 tablet (25 mg total) by mouth 2 (two) times daily. 180 tablet 3  . mirtazapine (REMERON) 30 MG tablet TAKE 1 TABLET BY MOUTH AT BEDTIME. 90 tablet 1  . Multiple Vitamin (MULTIVITAMIN WITH MINERALS) TABS tablet Take 1 tablet by mouth every morning.     . nitroGLYCERIN (NITROSTAT) 0.4 MG SL tablet PLACE 1 TABLET (0.4 MG TOTAL) UNDER THE TONGUE EVERY 5 (FIVE) MINUTES AS NEEDED. FOR CHEST PAIN 25 tablet 4  . Omega-3 Fatty Acids (FISH OIL) 1000 MG CAPS Take 1 capsule (1,000 mg total) by mouth 2 (two) times daily. 60 capsule 0  . prochlorperazine (COMPAZINE) 10 MG tablet TAKE 1 TABLET (10 MG TOTAL) BY MOUTH EVERY 6 (SIX) HOURS AS NEEDED FOR NAUSEA OR VOMITING. 30 tablet 0  . rosuvastatin (CRESTOR) 40 MG tablet TAKE 1 TABLET BY MOUTH EVERY DAY 90 tablet 3  . tamsulosin (FLOMAX) 0.4 MG CAPS capsule Take 0.4 mg by mouth daily.     No current facility-administered medications for this visit.       Physical Exam: BP 107/60 (BP Location: Left Arm, Patient Position: Sitting)   Pulse 76   Resp 18   Ht 6\' 3"  (1.905 m)   Wt 192 lb (87.1 kg)   SpO2 95%   BMI 24.00 kg/m   General appearance: alert, cooperative and no distress Heart: regular rate and rhythm Lungs: dim left base   Diagnostic Studies & Laboratory data:     Recent Radiology Findings:   DG Chest 2 View  Result Date: 09/20/2020 CLINICAL DATA:  Question occluded PleurX catheter. EXAM: CHEST - 2 VIEW COMPARISON:  Chest x-ray 08/27/2020. FINDINGS: Mediastinum hilar structures normal. Stable cardiomegaly. Persistent left base atelectasis. Developing left base infiltrate. Progressive left-sided pleural effusion. No pneumothorax noted on today's exam. Small right pleural effusion noted on today's exam. PleurX catheter in stable position. Degenerative changes scoliosis thoracic spine. Surgical clips left neck. IMPRESSION: 1. Persistent left  base atelectasis. Developing left base infiltrate. Progressive left-sided pleural effusion. Small right pleural effusion noted on today's exam. 2. PleurX catheter in stable position. Again there has been interval increase in left-sided pleural effusion. No pneumothorax noted on today's exam. Electronically Signed   By: [place]   On: 09/20/2020 14:07      Recent Lab Findings: Lab Results  Component Value Date   WBC 6.5 09/19/2020   HGB 10.1 (L) 09/19/2020   HCT 31.9 (L) 09/19/2020   PLT 104 (L) 09/19/2020   GLUCOSE 108 (H) 09/19/2020   CHOL 132 08/15/2019   TRIG 78  08/15/2019   HDL 46 08/15/2019   LDLCALC 71 08/15/2019   ALT 16 09/19/2020   AST 23 09/19/2020   NA 140 09/19/2020   K 3.6 09/19/2020   CL 102 09/19/2020   CREATININE 1.21 09/19/2020   BUN 15 09/19/2020   CO2 30 09/19/2020   TSH 2.957 09/05/2020   INR 1.2 08/02/2020   HGBA1C 5.3 09/06/2006      Assessment / Plan: An adapter was placed on the Pleurx catheter flushed with 50 cc of sterile saline without difficulty.  We then attempted to drain in a routine manner with the Pleurx collection bottle.  Less than 50 cc were able to be obtained.  Plan at this point is to obtain a chest CT to further evaluate lung fields and to see if this fluid may be loculated or if the tube may require replacement.  We will arrange this through the office and schedule him to be followed up with Dr. Roxan Hockey next Tuesday.  The patient understands that where he did become more symptomatic he should contact our office or if in any type of distress call EMS or go to the emergency room.  Clinically he looks pretty stable and does not appear in any distress at this time.  He will continue his current antitussive medication as well as his other medications      Medication Changes: No orders of the defined types were placed in this encounter.     John Giovanni, PA-C 09/20/2020 2:30 PM

## 2020-09-20 NOTE — Telephone Encounter (Signed)
Anderson Malta from [facility] called in regards to Mr. Hambly stating he has an occluded Pleurx catheter. Per RN, pt has been unable to drain his catheter for four days, is SOB & has had a cough. NP is requesting pt be seen today by a provider. Reported Cindra Eves can see Mr. Moone today at 5 with chest xray prior to arrival. Anderson Malta to relay message to Mr. Brouillet.

## 2020-09-21 ENCOUNTER — Other Ambulatory Visit: Payer: Self-pay | Admitting: Cardiology

## 2020-09-23 ENCOUNTER — Other Ambulatory Visit: Payer: Self-pay | Admitting: Cardiology

## 2020-09-23 ENCOUNTER — Ambulatory Visit
Admission: RE | Admit: 2020-09-23 | Discharge: 2020-09-23 | Disposition: A | Discharge status: Home / Self Care | Payer: Medicare Other | Source: Ambulatory Visit | Attending: Thoracic Surgery (Cardiothoracic Vascular Surgery) | Admitting: Thoracic Surgery (Cardiothoracic Vascular Surgery)

## 2020-09-23 DIAGNOSIS — J90 Pleural effusion, not elsewhere classified: Secondary | ICD-10-CM

## 2020-09-24 ENCOUNTER — Other Ambulatory Visit: Payer: Self-pay

## 2020-09-24 ENCOUNTER — Ambulatory Visit (INDEPENDENT_AMBULATORY_CARE_PROVIDER_SITE_OTHER): Payer: Medicare Other | Admitting: Thoracic Surgery (Cardiothoracic Vascular Surgery)

## 2020-09-24 ENCOUNTER — Telehealth: Payer: Self-pay | Admitting: *Deleted

## 2020-09-24 VITALS — BP 105/70 | HR 88 | Temp 97.6°F | Resp 20 | Ht 75.0 in | Wt 192.0 lb

## 2020-09-24 DIAGNOSIS — I6523 Occlusion and stenosis of bilateral carotid arteries: Secondary | ICD-10-CM

## 2020-09-24 DIAGNOSIS — J910 Malignant pleural effusion: Secondary | ICD-10-CM

## 2020-09-24 DIAGNOSIS — C3492 Malignant neoplasm of unspecified part of left bronchus or lung: Secondary | ICD-10-CM | POA: Diagnosis not present

## 2020-09-24 NOTE — Progress Notes (Signed)
[address]       [place] [address]             [phone]     HPI: Mr. Giammarino returns for follow-up regarding his malignant pleural effusion.  [place] Charles Robinson is a 74 year old man with stage IV lung cancer and a malignant left pleural effusion.  I placed a pleural catheter on 08/05/2020.  He drained 2 L initially and then was draining in the 200 to 350 mL range.  About a week ago they noticed that the drainage dropped off dramatically.  He was seen in the office by Jadene Pierini on 09/20/2020.  A CT of the chest was ordered.  He continues to have almost no drainage from the catheter.  His breathing is significantly improved from prior to placement of the catheter.  He has not noticed any significant deterioration over the past week or 2.  His wife says that he still coughs frequently.  Past Medical History:  Diagnosis Date  . Arthritis    back  . BPH (benign prostatic hypertrophy)   . CAD S/P multivessel PCI: LAD, RCA and extensive LCX-OM1 03/15/2012   s/p PCI to LAD, to RCA (now occluded), then extensive PCI to Dominant LCx-OM1  (enitre prox-AVG Circ for dissection in 07/2011)  . Cancer ([place])    SKIN CA  . Dyspnea   . Dysrhythmia    afib  . GERD (gastroesophageal reflux disease)   . Heart murmur   . History of colon polyps   . History of echocardiogram 08/16/2011   Echo - EF 60-65%; moderate LV concentric hypertrophy; abdnormal LV relaxation (grade 1 diastolic dysfunction; ascending aorta mildly dilated; LA moderately dilated;   . Hyperlipidemia   . Hypertension   . 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 PAF (paroxysmal atrial fibrillation) ([place]) 12/20/2012   followed by Dr. Glenetta Hew; s/p DCCV 2011, 07/2011 post PCI with Type 4a MI; CHA2DS2Vasc = 3, on Warfarin  . Paroxysmal atrial flutter ([place]) 08/20/2011   TEE- atrial septum - no defect identified; RA normal in size, no  evidence of thrombus; ascending aorta normal  . Prostate cancer ([place])   . Rupture quadriceps tendon     Current Outpatient Medications  Medication Sig Dispense Refill  . acetaminophen (TYLENOL) 325 MG tablet Take 650 mg by mouth every 6 (six) hours as needed for moderate pain.    [name] amiodarone (PACERONE) 200 MG tablet Take 1 tablet (200 mg total) by mouth daily.    . Coenzyme Q10 200 MG capsule Take 200 mg by mouth every morning.     [name] ELIQUIS 5 MG TABS tablet TAKE 1 TABLET BY MOUTH TWICE A DAY (Patient taking differently: Take 5 mg by mouth 2 (two) times daily. ) 180 tablet 2  . ezetimibe (ZETIA) 10 MG tablet Take 1 tablet (10 mg total) by mouth daily. 90 tablet 2  . finasteride (PROSCAR) 5 MG tablet Take 5 mg by mouth every evening.     . folic acid (FOLVITE) 1 MG tablet Take 1 tablet (1 mg total) by mouth daily. 30 tablet 4  . HYDROcodone-homatropine (HYCODAN) 5-1.5 MG/5ML syrup Take 5 mLs by mouth every 6 (six) hours as needed for cough. 120 mL 0  . hydroxypropyl methylcellulose / hypromellose (ISOPTO TEARS / GONIOVISC) 2.5 % ophthalmic solution Place 1 drop into both eyes 3 (three) times daily as needed for dry eyes.    [name]  Leuprolide Acetate (ELIGARD [place]) Inject into the skin.     . metoprolol tartrate (LOPRESSOR) 25 MG tablet Take 1 tablet (25 mg total) by mouth 2 (two) times daily. 180 tablet 3  . mirtazapine (REMERON) 30 MG tablet TAKE 1 TABLET BY MOUTH AT BEDTIME. 90 tablet 1  . Multiple Vitamin (MULTIVITAMIN WITH MINERALS) TABS tablet Take 1 tablet by mouth every morning.     . nitroGLYCERIN (NITROSTAT) 0.4 MG SL tablet PLACE 1 TABLET (0.4 MG TOTAL) UNDER THE TONGUE EVERY 5 (FIVE) MINUTES AS NEEDED. FOR CHEST PAIN 25 tablet 4  . Omega-3 Fatty Acids (FISH OIL) 1000 MG CAPS Take 1 capsule (1,000 mg total) by mouth 2 (two) times daily. 60 capsule 0  . prochlorperazine (COMPAZINE) 10 MG tablet TAKE 1 TABLET (10 MG TOTAL) BY MOUTH EVERY 6 (SIX) HOURS AS NEEDED FOR NAUSEA OR VOMITING. 30  tablet 0  . rosuvastatin (CRESTOR) 40 MG tablet TAKE 1 TABLET BY MOUTH EVERY DAY 90 tablet 3  . tamsulosin (FLOMAX) 0.4 MG CAPS capsule Take 0.4 mg by mouth daily.     No current facility-administered medications for this visit.    Physical Exam BP 105/70   Pulse 88   Temp 97.6 F (36.4 C) (Skin)   Resp 20   Ht 6\' 3"  (1.905 m)   Wt 192 lb (87.1 kg)   SpO2 94% Comment: RA  BMI 24.78 kg/m  74 year old man in no acute distress Alert and oriented x3 with no focal deficits Diminished breath sounds left base Catheter in place, site clean, flushes easily  Diagnostic Tests: CT CHEST WITHOUT CONTRAST  TECHNIQUE: Multidetector CT imaging of the chest was performed following the standard protocol without IV contrast. Sagittal and coronal MPR images reconstructed from axial data set.  COMPARISON:  07/11/2020  Correlation: PET-CT 07/16/2020  FINDINGS: Cardiovascular: Atherosclerotic calcifications of thoracic aorta, minimally in proximal great vessels, and significantly in coronary arteries. Small pericardial effusion. Heart size normal. 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  Mediastinum/Nodes: Esophagus unremarkable. Base of cervical region normal appearance. No thoracic adenopathy.  Lungs/Pleura: Small RIGHT pleural effusion. Loculated moderate LEFT pleural effusion including fluid within the major fissure LEFT thoracostomy tube. Subsegmental atelectasis medial LEFT upper lobe. Minimal compressive atelectasis in both lungs. No pulmonary infiltrate or pneumothorax. No discrete pulmonary mass. No definite pleural based masses in the hemithoraces identified.  Upper Abdomen: Cholelithiasis. Upper pole cyst RIGHT kidney incompletely visualized, 5.3 x 4.9 cm. Smaller cyst upper pole RIGHT kidney 2.6 cm greatest diameter. Low-attenuation foci within liver question cysts again noted.  Musculoskeletal: Scattered osseous metastases in spine,  ribs, sternum  IMPRESSION: Loculated moderate LEFT pleural effusion including fluid within the major fissure despite LEFT thoracostomy tube.  Small RIGHT pleural effusion with minimal compressive atelectasis in both lungs.  Scattered osseous metastases.  Cholelithiasis.  RIGHT renal cysts.  Extensive atherosclerotic calcifications including coronary arteries with aneurysmal dilatation of the ascending thoracic aorta, 4.4 cm greatest diameter; recommend annual imaging followup by CTA or MRA.  This recommendation follows 2010 ACCF/AHA/AATS/ACR/ASA/SCA/SCAI/SIR/STS/SVM Guidelines for the Diagnosis and Management of Patients with Thoracic Aortic Disease. Circulation. 2010; 121: L892-J194. Aortic aneurysm NOS (ICD10-I71.9)  Aortic Atherosclerosis (ICD10-I70.0)  Aortic aneurysm NOS (ICD10-I71.9).   Electronically Signed   By: Lavonia Dana M.D.   On: 09/23/2020 12:21 I personally reviewed the CT images and concur with the findings noted above.  There is some loculated left pleural effusion  Impression: Charles Robinson is a 74 year old man with stage IV  lung cancer with malignant left pleural effusion.  He has had a pleural catheter in place for about 6 weeks.  He drained about 2 L initially and then was draining 200 to 350 mL a day.  More recently the drainage is dropped off dramatically.  His catheter flushes easily but only returns about the amount of saline that is instilled.  Therefore the catheter is not occluded but could potentially be "walled off."  So far he has tolerated this well symptomatically.  I recommended that we wait about 2 weeks without draining the catheter.  We will plan to repeat a chest x-ray.  If that is stable he still doing well from a respiratory standpoint we will plan to DC the catheter at that time.  If his effusion is getting larger we can discuss the possibility of attempting thrombolytic therapy versus having another catheter  placed.  Plan: Return in 2 weeks Patient knows to call if he has some respiratory difficulties in the meantime  Melrose Nakayama, MD [facility] [phone]

## 2020-09-24 NOTE — Telephone Encounter (Signed)
Per Dr. Roxan Hockey, [place] with [facility] called to cancel pleurX draining's until patients next appt with Dr. Roxan Hockey on November 16.

## 2020-09-25 ENCOUNTER — Telehealth: Payer: Self-pay | Admitting: Cardiology

## 2020-09-25 ENCOUNTER — Other Ambulatory Visit: Payer: Self-pay | Admitting: Cardiology

## 2020-09-25 NOTE — Telephone Encounter (Signed)
Spoke with patient wife and patient as they have been taking 1 tablet of medication daily and never changed to taking a half of tablet daily of Amiodarone.  I contacted patient pharmacy and though we have sent in multiple Rx refill request (9/1 by Dr. Leonarda Salon office; 10/30 and 11/1 from our office) they have not received any of those request. I provided a verbal refills and contacted the patient to make him aware and remind him to take the HALF a tablet by mouth ONCE daily. Verbalized understanding and not additional questions at this time.   Sending note to [place] as patient never adjusted his does to half a tablet daily and he needs to continue at this dose. Wife did state that BP readings are all over the place from 70/50 (get him walking, hydrate, feed him and usually comes up) to 150/70. Please advise patient if needs to stay on half tablet 100 mg Daily or keep doing as he has been taking 1 tablet by mouth 200 mg daily

## 2020-09-25 NOTE — Telephone Encounter (Signed)
*  STAT* If patient is at the pharmacy, call can be transferred to refill team.   1. Which medications need to be refilled? (please list name of each medication and dose if known) amiodarone   2. Which pharmacy/location (including street and city if local pharmacy) is medication to be sent to? [facility]  3. Do they need a 30 day or 90 day supply? [place]

## 2020-09-26 ENCOUNTER — Other Ambulatory Visit: Payer: Self-pay

## 2020-09-26 ENCOUNTER — Ambulatory Visit: Payer: Medicare Other | Admitting: Internal Medicine

## 2020-09-26 ENCOUNTER — Inpatient Hospital Stay: Payer: Medicare Other | Attending: Internal Medicine

## 2020-09-26 ENCOUNTER — Ambulatory Visit: Payer: Medicare Other

## 2020-09-26 ENCOUNTER — Other Ambulatory Visit: Payer: Medicare Other

## 2020-09-26 DIAGNOSIS — C3492 Malignant neoplasm of unspecified part of left bronchus or lung: Secondary | ICD-10-CM

## 2020-09-26 DIAGNOSIS — Z5189 Encounter for other specified aftercare: Secondary | ICD-10-CM | POA: Diagnosis not present

## 2020-09-26 DIAGNOSIS — E785 Hyperlipidemia, unspecified: Secondary | ICD-10-CM | POA: Insufficient documentation

## 2020-09-26 DIAGNOSIS — Z79899 Other long term (current) drug therapy: Secondary | ICD-10-CM | POA: Insufficient documentation

## 2020-09-26 DIAGNOSIS — I480 Paroxysmal atrial fibrillation: Secondary | ICD-10-CM | POA: Insufficient documentation

## 2020-09-26 DIAGNOSIS — C3412 Malignant neoplasm of upper lobe, left bronchus or lung: Secondary | ICD-10-CM | POA: Insufficient documentation

## 2020-09-26 DIAGNOSIS — J9811 Atelectasis: Secondary | ICD-10-CM | POA: Diagnosis not present

## 2020-09-26 DIAGNOSIS — I2510 Atherosclerotic heart disease of native coronary artery without angina pectoris: Secondary | ICD-10-CM | POA: Diagnosis not present

## 2020-09-26 DIAGNOSIS — J910 Malignant pleural effusion: Secondary | ICD-10-CM | POA: Diagnosis not present

## 2020-09-26 DIAGNOSIS — K219 Gastro-esophageal reflux disease without esophagitis: Secondary | ICD-10-CM | POA: Diagnosis not present

## 2020-09-26 DIAGNOSIS — R5382 Chronic fatigue, unspecified: Secondary | ICD-10-CM

## 2020-09-26 DIAGNOSIS — N400 Enlarged prostate without lower urinary tract symptoms: Secondary | ICD-10-CM | POA: Diagnosis not present

## 2020-09-26 DIAGNOSIS — R000 Tachycardia, unspecified: Secondary | ICD-10-CM | POA: Diagnosis not present

## 2020-09-26 DIAGNOSIS — Z923 Personal history of irradiation: Secondary | ICD-10-CM | POA: Insufficient documentation

## 2020-09-26 DIAGNOSIS — C7951 Secondary malignant neoplasm of bone: Secondary | ICD-10-CM | POA: Diagnosis not present

## 2020-09-26 DIAGNOSIS — I119 Hypertensive heart disease without heart failure: Secondary | ICD-10-CM | POA: Diagnosis not present

## 2020-09-26 DIAGNOSIS — Z8546 Personal history of malignant neoplasm of prostate: Secondary | ICD-10-CM | POA: Insufficient documentation

## 2020-09-26 DIAGNOSIS — Z7901 Long term (current) use of anticoagulants: Secondary | ICD-10-CM | POA: Diagnosis not present

## 2020-09-26 DIAGNOSIS — R634 Abnormal weight loss: Secondary | ICD-10-CM | POA: Diagnosis not present

## 2020-09-26 DIAGNOSIS — Z5111 Encounter for antineoplastic chemotherapy: Secondary | ICD-10-CM | POA: Insufficient documentation

## 2020-09-26 LAB — CBC WITH DIFFERENTIAL (CANCER CENTER ONLY)
Abs Immature Granulocytes: 0.04 10*3/uL (ref 0.00–0.07)
Basophils Absolute: 0 10*3/uL (ref 0.0–0.1)
Basophils Relative: 0 %
Eosinophils Absolute: 0 10*3/uL (ref 0.0–0.5)
Eosinophils Relative: 1 %
HCT: 29.1 % — ABNORMAL LOW (ref 39.0–52.0)
Hemoglobin: 9.2 g/dL — ABNORMAL LOW (ref 13.0–17.0)
Immature Granulocytes: 1 %
Lymphocytes Relative: 5 %
Lymphs Abs: 0.3 10*3/uL — ABNORMAL LOW (ref 0.7–4.0)
MCH: 28.4 pg (ref 26.0–34.0)
MCHC: 31.6 g/dL (ref 30.0–36.0)
MCV: 89.8 fL (ref 80.0–100.0)
Monocytes Absolute: 0.5 10*3/uL (ref 0.1–1.0)
Monocytes Relative: 9 %
Neutro Abs: 4.9 10*3/uL (ref 1.7–7.7)
Neutrophils Relative %: 84 %
Platelet Count: 117 10*3/uL — ABNORMAL LOW (ref 150–400)
RBC: 3.24 MIL/uL — ABNORMAL LOW (ref 4.22–5.81)
RDW: 17.6 % — ABNORMAL HIGH (ref 11.5–15.5)
WBC Count: 5.8 10*3/uL (ref 4.0–10.5)
nRBC: 0 % (ref 0.0–0.2)

## 2020-09-26 LAB — CMP (CANCER CENTER ONLY)
ALT: 17 U/L (ref 0–44)
AST: 23 U/L (ref 15–41)
Albumin: 2.4 g/dL — ABNORMAL LOW (ref 3.5–5.0)
Alkaline Phosphatase: 116 U/L (ref 38–126)
Anion gap: 6 (ref 5–15)
BUN: 13 mg/dL (ref 8–23)
CO2: 34 mmol/L — ABNORMAL HIGH (ref 22–32)
Calcium: 8.8 mg/dL — ABNORMAL LOW (ref 8.9–10.3)
Chloride: 102 mmol/L (ref 98–111)
Creatinine: 1.23 mg/dL (ref 0.61–1.24)
GFR, Estimated: >60 mL/min (ref >60)
Glucose, Bld: 120 mg/dL — ABNORMAL HIGH (ref 70–99)
Potassium: 3.8 mmol/L (ref 3.5–5.1)
Sodium: 142 mmol/L (ref 135–145)
Total Bilirubin: 0.4 mg/dL (ref 0.3–1.2)
Total Protein: 6 g/dL — ABNORMAL LOW (ref 6.5–8.1)

## 2020-09-26 LAB — TSH: TSH: 2.291 u[IU]/mL (ref 0.320–4.118)

## 2020-09-26 NOTE — Telephone Encounter (Signed)
Based on my last note, the plan was for him to actually stop taking amiodarone.  Someone else must have restarted it or have him continue it, probably related to his lung cancer.  If he is taking it, it is perfectly fine for him take 1/2 tablet a day. -> Changed Rx: Amiodarone 200 mg, take 1/2 tablet daily.  My concern was for his cough but he may have had some amiodarone toxicity, clearly he did not.  I do think we can reduce his overall dose though.  Glenetta Hew, MD

## 2020-09-30 NOTE — Progress Notes (Signed)
[facility] OFFICE PROGRESS NOTE  Charles Redwood, MD [place] [address]  DIAGNOSIS: Stage IV (T3, N2, M1c)non-small celllung cancer, adenocarcinomapresented with left upper lobe lung mass with occlusion of the left upper lobe bronchus and near complete left lower lobe atelectasis in addition to mediastinal lymphadenopathy in the AP window and suspicious malignant left pleural effusion and multifocal bone metastasis diagnosed in August 2021.  BIOMARKER(S)% CFDNA OR AMPLIFICATIONASSOCIATED FDA-APPROVED THERAPIESCLINICAL TRIAL AVAILABILITY [id number]* 0.2% NoneYes  [id number] 0.2% None Yes  PRIOR THERAPY: Status post left Pleurx placement for drainage of recurrent pleural effusion under the care of Dr. Roxan Hockey on 08/05/2020.  CURRENT THERAPY: Systemic chemotherapy with carboplatin for AUC of 5, Alimta 500 mg/M2 and Keytruda 200 mg IV every 3 weeks. First dose 08/15/2020.Status post 2 cycles.  INTERVAL HISTORY: Charles Robinson 75 y.o. male returns to the clinic today for a follow-up visit accompanied by his wife.  The patient is feeling fairly well today without any concerning complaints except for some persistent nausea following his last cycle of treatment. He states he takes compazine every 8 hours with good control of his nausea. The patient has been tolerating his treatment well except for chemo-therapy induced neutropenia which was causing dose delays for which he now recieves udencya injections.  He denies fevers, chills, or night sweats. He reports 4 lb weight loss. He drinks supplemental drinks "on occasion". He denies any chest pain or hemoptysis but reports his baseline dyspnea. He reports a persistent cough. He is requesting a refill of hycodan as that has helped him. The patient has a Pleurx catheter placed for his malignant pleural effusion. He recently had a CT scan without contrast on 09/23/20 to evaluate this. He is  scheduled to have a chest x-ray done by Dr. Roxan Hockey and have a follow up visit on 10/08/20. The patient denies any headache or visual changes.  He denies any rashes or skin changes. The patient denies any vomiting, diarrhea, or constipation at this time. The patient is followed by cardiology. He has a history of PAF. The patient is here today for evaluation before starting cycle #3.   MEDICAL HISTORY: Past Medical History:  Diagnosis Date  . Arthritis    back  . BPH (benign prostatic hypertrophy)   . CAD S/P multivessel PCI: LAD, RCA and extensive LCX-OM1 03/15/2012   s/p PCI to LAD, to RCA (now occluded), then extensive PCI to Dominant LCx-OM1  (enitre prox-AVG Circ for dissection in 07/2011)  . Cancer ([facility])    SKIN CA  . Dyspnea   . Dysrhythmia    afib  . GERD (gastroesophageal reflux disease)   . Heart murmur   . History of colon polyps   . History of echocardiogram 08/16/2011   Echo - EF 60-65%; moderate LV concentric hypertrophy; abdnormal LV relaxation (grade 1 diastolic dysfunction; ascending aorta mildly dilated; LA moderately dilated;   . Hyperlipidemia   . Hypertension   . 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 PAF (paroxysmal atrial fibrillation) ([facility]) 12/20/2012   followed by Dr. Glenetta Hew; s/p DCCV 2011, 07/2011 post PCI with Type 4a MI; CHA2DS2Vasc = 3, on Warfarin  . Paroxysmal atrial flutter ([facility]) 08/20/2011   TEE- atrial septum - no defect identified; RA normal in size, no evidence of thrombus; ascending aorta normal  . Prostate cancer ([facility])   . Rupture quadriceps tendon     ALLERGIES:  is allergic  to lipitor [atorvastatin], morphine and related, and vicodin [hydrocodone-acetaminophen].  MEDICATIONS:  Current Outpatient Medications  Medication Sig Dispense Refill  . acetaminophen (TYLENOL) 325 MG tablet Take 650 mg by mouth every 6 (six) hours as needed for moderate pain.     [name] amiodarone (PACERONE) 200 MG tablet Take 1 tablet (200 mg total) by mouth daily.    . Coenzyme Q10 200 MG capsule Take 200 mg by mouth every morning.     [name] ELIQUIS 5 MG TABS tablet TAKE 1 TABLET BY MOUTH TWICE A DAY (Patient taking differently: Take 5 mg by mouth 2 (two) times daily. ) 180 tablet 2  . ezetimibe (ZETIA) 10 MG tablet Take 1 tablet (10 mg total) by mouth daily. 90 tablet 2  . finasteride (PROSCAR) 5 MG tablet Take 5 mg by mouth every evening.     . folic acid (FOLVITE) 1 MG tablet Take 1 tablet (1 mg total) by mouth daily. 30 tablet 4  . HYDROcodone-homatropine (HYCODAN) 5-1.5 MG/5ML syrup Take 5 mLs by mouth every 6 (six) hours as needed for cough. 120 mL 0  . hydroxypropyl methylcellulose / hypromellose (ISOPTO TEARS / GONIOVISC) 2.5 % ophthalmic solution Place 1 drop into both eyes 3 (three) times daily as needed for dry eyes.    [name] Leuprolide Acetate (ELIGARD [place]) Inject into the skin.     . metoprolol tartrate (LOPRESSOR) 25 MG tablet Take 1 tablet (25 mg total) by mouth 2 (two) times daily. 180 tablet 3  . mirtazapine (REMERON) 30 MG tablet TAKE 1 TABLET BY MOUTH AT BEDTIME. 90 tablet 1  . Multiple Vitamin (MULTIVITAMIN WITH MINERALS) TABS tablet Take 1 tablet by mouth every morning.     . nitroGLYCERIN (NITROSTAT) 0.4 MG SL tablet PLACE 1 TABLET (0.4 MG TOTAL) UNDER THE TONGUE EVERY 5 (FIVE) MINUTES AS NEEDED. FOR CHEST PAIN 25 tablet 4  . Omega-3 Fatty Acids (FISH OIL) 1000 MG CAPS Take 1 capsule (1,000 mg total) by mouth 2 (two) times daily. 60 capsule 0  . prochlorperazine (COMPAZINE) 10 MG tablet Take 1 tablet (10 mg total) by mouth every 6 (six) hours as needed for nausea or vomiting. 30 tablet 3  . rosuvastatin (CRESTOR) 40 MG tablet TAKE 1 TABLET BY MOUTH EVERY DAY 90 tablet 3  . tamsulosin (FLOMAX) 0.4 MG CAPS capsule Take 0.4 mg by mouth daily.     No current facility-administered medications for this visit.    SURGICAL HISTORY:  Past Surgical History:   Procedure Laterality Date  . BACK SURGERY    . CHEST TUBE INSERTION Left 08/05/2020   Procedure: INSERTION LEFT PLEURAL DRAINAGE CATHETER;  Surgeon: Melrose Nakayama, MD;  Location: [facility];  Service: Thoracic;  Laterality: Left;  . COLONOSCOPY    . CORONARY ANGIOPLASTY WITH STENT PLACEMENT  03/15/2012   multiple stens in AVGroove Circ & OM1;; 2 site PTCA w/ PTCA and stenting of OM2 ang PTCA of distal stent ISR followed by PTCA of mid LCx ISR  . CORONARY STENT PLACEMENT  2003 - 2011 - 2012 - 2013   Pre-2012 - BMS in RCA now known occluded, BMS in proximal LAD; 07/2011 - 2.25 x 23 BMS in OM 1 with significant disease on either side noted shortly after -->  2 additional overlapping DES 2.5 mm x 30 mm and 2.25 mm x 26 mm in OM1,; 3 overlapping Resolute DES in AV groove Cx crossing OM1: (Tapered from 3.8-2.6 mm)- Resolute DES 3.5 x 22, 3.0 x 38, 2.5 x  14  . DOPPLER ECHOCARDIOGRAPHY  07/2011   EF ~60-65%, Grade 1 D Dysfxn; mod Conc LVH  . ENDARTERECTOMY Left 02/27/2016   Procedure: LEFT  CAROTID ARTERY ENDARTERECTOMY ;  Surgeon: Serafina Mitchell, MD;  Location: [facility];  Service: Vascular;  Laterality: Left;  . LEFT HEART CATHETERIZATION WITH CORONARY ANGIOGRAM N/A 03/15/2012   Procedure: LEFT HEART CATHETERIZATION WITH CORONARY ANGIOGRAM;  Surgeon: [name], MD;  Location: [facility] CATH LAB: patent LAD stents, patent LCx stents w/80% focal ISR just distal to OM; OM proximal stent open; -- 2 site PTCA-PCI  . LUMBAR LAMINECTOMY/DECOMPRESSION MICRODISCECTOMY Bilateral 12/11/2014   Procedure: Bilateral Lumbar Three-Four Laminectomy;  Surgeon: Kristeen Miss, MD;  Location: [facility]UR[facility];  Service: Neurosurgery;  Laterality: Bilateral;  bilateral  . NM MYOVIEW LTD  11/2012   No ischemia or infarction, normal EF & WM  . PATCH ANGIOPLASTY Left 02/27/2016   Procedure: WITH HEMASHIELD DACRON  PATCH ANGIOPLASTY;  Surgeon: Serafina Mitchell, MD;  Location: [facility];  Service: Vascular;  Laterality: Left;  [name] QUADRICEPS TENDON  REPAIR  09/19/2012   Procedure: REPAIR QUADRICEP TENDON;  Surgeon: [name], MD;  Location: [facility];  Service: Orthopedics;  Laterality: Left;  [name] VASECTOMY      REVIEW OF SYSTEMS:   Review of Systems  Constitutional: Positive for fatigue and weight loss. Negative for chills, fever and unexpected weight change.  HENT: Negative for mouth sores, nosebleeds, sore throat and trouble swallowing.   Eyes: Negative for eye problems and icterus.  Respiratory: Positive for cough and dyspnea on exertion. Negative for hemoptysis and wheezing.   Cardiovascular: Negative for chest pain and leg swelling.  Gastrointestinal: Positive for nausea. Negative for abdominal pain, constipation, diarrhea, and vomiting.  Genitourinary: Negative for bladder incontinence, difficulty urinating, dysuria, frequency and hematuria.   Musculoskeletal: Negative for back pain, gait problem, neck pain and neck stiffness.  Skin: Negative for itching and rash.  Neurological: Negative for dizziness, extremity weakness, gait problem, headaches, light-headedness and seizures.  Hematological: Negative for adenopathy. Does not bruise/bleed easily.  Psychiatric/Behavioral: Negative for confusion, depression and sleep disturbance. The patient is not nervous/anxious.     PHYSICAL EXAMINATION:  Blood pressure (!) 91/51, pulse (!) 117, temperature (!) 97.1 F (36.2 C), temperature source Tympanic, resp. rate 17, height 6\' 3"  (1.905 m), weight 188 lb 8 oz (85.5 kg), SpO2 100 %.  ECOG PERFORMANCE STATUS: 1 - Symptomatic but completely ambulatory  Physical Exam  Constitutional: Oriented to person, place, and time and well-developed, well-nourished, and in no distress.  HENT:  Head: Normocephalic and atraumatic.  Mouth/Throat: Oropharynx is clear and moist. No oropharyngeal exudate.  Eyes: Conjunctivae are normal. Right eye exhibits no discharge. Left eye exhibits no discharge. No scleral icterus.  Neck: Normal range of motion.  Neck supple.  Cardiovascular: Tachycardic, regular rhythm, normal heart sounds and intact distal pulses.   Pulmonary/Chest: Effort normal and breath sounds normal. No respiratory distress. No wheezes. No rales.  Abdominal: Soft. Bowel sounds are normal. Exhibits no distension and no mass. There is no tenderness.  Musculoskeletal: Normal range of motion. Exhibits no edema.  Lymphadenopathy:    No cervical adenopathy.  Neurological: Alert and oriented to person, place, and time. Exhibits normal muscle tone. Examined in the wheelchair.  Skin: Skin is warm and dry. No rash noted. Not diaphoretic. No erythema. No pallor.  Psychiatric: Mood, memory and judgment normal.  Vitals reviewed.  LABORATORY DATA: Lab Results  Component Value Date   WBC 6.5  10/03/2020   HGB 10.4 (L) 10/03/2020   HCT 33.2 (L) 10/03/2020   MCV 92.0 10/03/2020   PLT 227 10/03/2020      Chemistry      Component Value Date/Time   NA 142 10/03/2020 0922   NA 140 08/15/2019 0936   K 3.4 (L) 10/03/2020 0922   CL 102 10/03/2020 0922   CO2 31 10/03/2020 0922   BUN 18 10/03/2020 0922   BUN 27 08/15/2019 0936   CREATININE 1.31 (H) 10/03/2020 0922   CREATININE 1.43 (H) 07/29/2016 0814      Component Value Date/Time   CALCIUM 8.9 10/03/2020 0922   ALKPHOS 118 10/03/2020 0922   AST 24 10/03/2020 0922   ALT 14 10/03/2020 0922   BILITOT 0.6 10/03/2020 0922       RADIOGRAPHIC STUDIES:  DG Chest 2 View  Result Date: 09/20/2020 CLINICAL DATA:  Question occluded PleurX catheter. EXAM: CHEST - 2 VIEW COMPARISON:  Chest x-ray 08/27/2020. FINDINGS: Mediastinum hilar structures normal. Stable cardiomegaly. Persistent left base atelectasis. Developing left base infiltrate. Progressive left-sided pleural effusion. No pneumothorax noted on today's exam. Small right pleural effusion noted on today's exam. PleurX catheter in stable position. Degenerative changes scoliosis thoracic spine. Surgical clips left neck. IMPRESSION: 1.  Persistent left base atelectasis. Developing left base infiltrate. Progressive left-sided pleural effusion. Small right pleural effusion noted on today's exam. 2. PleurX catheter in stable position. Again there has been interval increase in left-sided pleural effusion. No pneumothorax noted on today's exam. Electronically Signed   By: Marcello Moores  Register   On: 09/20/2020 14:07   CT CHEST WO CONTRAST  Result Date: 09/23/2020 CLINICAL DATA:  LEFT chest drain placed 3-4 weeks ago, follow-up, pleural effusion, prostate cancer post radiation therapy, lymphadenopathy LEFT arm EXAM: CT CHEST WITHOUT CONTRAST TECHNIQUE: Multidetector CT imaging of the chest was performed following the standard protocol without IV contrast. Sagittal and coronal MPR images reconstructed from axial data set. COMPARISON:  07/11/2020 Correlation: PET-CT 07/16/2020 FINDINGS: Cardiovascular: Atherosclerotic calcifications of thoracic aorta, minimally in proximal great vessels, and significantly in coronary arteries. Small pericardial effusion. Heart size normal. 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 Mediastinum/Nodes: Esophagus unremarkable. Base of cervical region normal appearance. No thoracic adenopathy. Lungs/Pleura: Small RIGHT pleural effusion. Loculated moderate LEFT pleural effusion including fluid within the major fissure LEFT thoracostomy tube. Subsegmental atelectasis medial LEFT upper lobe. Minimal compressive atelectasis in both lungs. No pulmonary infiltrate or pneumothorax. No discrete pulmonary mass. No definite pleural based masses in the hemithoraces identified. Upper Abdomen: Cholelithiasis. Upper pole cyst RIGHT kidney incompletely visualized, 5.3 x 4.9 cm. Smaller cyst upper pole RIGHT kidney 2.6 cm greatest diameter. Low-attenuation foci within liver question cysts again noted. Musculoskeletal: Scattered osseous metastases in spine, ribs, sternum IMPRESSION: Loculated moderate LEFT pleural  effusion including fluid within the major fissure despite LEFT thoracostomy tube. Small RIGHT pleural effusion with minimal compressive atelectasis in both lungs. Scattered osseous metastases. Cholelithiasis. RIGHT renal cysts. Extensive atherosclerotic calcifications including coronary arteries with aneurysmal dilatation of the ascending thoracic aorta, 4.4 cm greatest diameter; recommend annual imaging followup by CTA or MRA. This recommendation follows 2010 ACCF/AHA/AATS/ACR/ASA/SCA/SCAI/SIR/STS/SVM Guidelines for the Diagnosis and Management of Patients with Thoracic Aortic Disease. Circulation. 2010; 121: V409-W119. Aortic aneurysm NOS (ICD10-I71.9) Aortic Atherosclerosis (ICD10-I70.0) Aortic aneurysm NOS (ICD10-I71.9). Electronically Signed   By: Lavonia Dana M.D.   On: 09/23/2020 12:21     ASSESSMENT/PLAN:  recently diagnosed with stage IV (T3, N2, M1 C) non-small cell lung cancer, adenocarcinoma.  He presented with a left upper lobe lung mass and occlusion of the left upper lobe bronchus as well as complete left lower lobe atelectasis.  He also had mediastinal lymphadenopathy in the AP window as well as a malignant left pleural effusion and metastatic disease to the bone.  He was diagnosed in August 2021.  The patient's molecular studies by guardant [employer] did not show any actionable mutations.  There was not enough material from his pleural effusion fluid for molecular studies by foundation 1.  The patient is currently undergoing systemic chemotherapy with carboplatin for an AUC of 5, Alimta 500 mg per metered squared, Keytruda 200 mg IV every 3 weeks.  He is status post 2 cycles.  He tolerated it well except for a few episodes of nausea which was controlled with his antiemetic. He also had chemotherapy induced neutropenia and currently recieves udencya injections on day 3 to prevent neutropenia/dose delays.   The patient also has a Pleurx catheter which was placed under the care of Dr. Roxan Hockey  in September 2021. He has a follow up appointment with him scheduled on 10/08/20.  The patient was seen with Dr. Julien Nordmann today.  Labs were reviewed. Recommend he proceed with cycle #3 today as scheduled.   I will arrange for a restaging CT scan of the chest, abdomen, and pelvis prior to starting his next cycle of treatment. We will order with contrast to help visualize his malignancy's treatment response   We will see the patient back for follow-up visit in 3 weeks for evaluation and to review his scan results before starting cycle #4.   I will reach out to the authorization team to see if the patient would qualify for onpro due to his transportation constraints.   The patient has a history of PAF. He took lopressor this morning. His pulse was 117 and his BP is low at 91/51. He had an EKG performed which showed sinus tachycardia with PVCs. His ventricular rate is 110. We will arrange for him to have additional IVF today while in infusion.  He was encouraged to increase his protein intake. He was instructed to increase his intake of boost/ensure for his weight loss.   I refilled his hycodan and compazine.    The patient was advised to call immediately if he has any concerning symptoms in the interval. The patient voices understanding of current disease status and treatment options and is in agreement with the current care plan. All questions were answered. The patient knows to call the clinic with any problems, questions or concerns. We can certainly see the patient much sooner if necessary  Orders Placed This Encounter  Procedures  . CT Abdomen Pelvis W Contrast    Standing Status:   Future    Standing Expiration Date:   10/03/2021    Order Specific Question:   If indicated for the ordered procedure, I authorize the administration of contrast media per Radiology protocol    Answer:   Yes    Order Specific Question:   Preferred imaging location?    Answer:   [facility]     Order Specific Question:   Is Oral Contrast requested for this exam?    Answer:   Yes, Per Radiology protocol  . CT Chest W Contrast    Standing Status:   Future    Standing Expiration Date:   10/03/2021    Order Specific Question:   If indicated for the ordered procedure, I authorize the administration of contrast  media per Radiology protocol    Answer:   Yes    Order Specific Question:   Preferred imaging location?    Answer:   [facility], [facility] 10/03/20  ADDENDUM: Hematology/Oncology Attending: I had a face-to-face encounter with the patient today.  I recommended his care plan.  This is a very pleasant 74 years old white male with a stage IV non-small cell lung cancer, adenocarcinoma and he is currently undergoing systemic chemotherapy with carboplatin, Alimta and Keytruda every 3 weeks status post 2 cycles.  The patient has been tolerating this treatment well with no concerning adverse effect except for mild fatigue and persistent dry cough. I recommended for him to proceed with cycle #3 today as planned. He will come back for follow-up visit in 3 weeks for evaluation with repeat CT scan of the chest, abdomen pelvis for restaging of his disease. The patient was advised to call immediately if he has any other concerning symptoms in the interval.  Disclaimer: This note was dictated with voice recognition software. Similar sounding words can inadvertently be transcribed and may be missed upon review. Eilleen Kempf, MD 10/04/20

## 2020-10-01 ENCOUNTER — Ambulatory Visit: Payer: Medicare Other | Admitting: Thoracic Surgery (Cardiothoracic Vascular Surgery)

## 2020-10-03 ENCOUNTER — Inpatient Hospital Stay: Payer: Medicare Other

## 2020-10-03 ENCOUNTER — Encounter: Payer: Self-pay | Admitting: Internal Medicine

## 2020-10-03 ENCOUNTER — Other Ambulatory Visit: Payer: Medicare Other

## 2020-10-03 ENCOUNTER — Other Ambulatory Visit: Payer: Self-pay

## 2020-10-03 ENCOUNTER — Inpatient Hospital Stay (HOSPITAL_BASED_OUTPATIENT_CLINIC_OR_DEPARTMENT_OTHER): Payer: Medicare Other | Admitting: Physician Assistant

## 2020-10-03 VITALS — HR 109

## 2020-10-03 VITALS — BP 91/51 | HR 117 | Temp 97.1°F | Resp 17 | Ht 75.0 in | Wt 188.5 lb

## 2020-10-03 DIAGNOSIS — R059 Cough, unspecified: Secondary | ICD-10-CM | POA: Diagnosis not present

## 2020-10-03 DIAGNOSIS — I6523 Occlusion and stenosis of bilateral carotid arteries: Secondary | ICD-10-CM | POA: Diagnosis not present

## 2020-10-03 DIAGNOSIS — C3492 Malignant neoplasm of unspecified part of left bronchus or lung: Secondary | ICD-10-CM

## 2020-10-03 DIAGNOSIS — I959 Hypotension, unspecified: Secondary | ICD-10-CM | POA: Insufficient documentation

## 2020-10-03 DIAGNOSIS — R000 Tachycardia, unspecified: Secondary | ICD-10-CM | POA: Diagnosis not present

## 2020-10-03 DIAGNOSIS — Z5111 Encounter for antineoplastic chemotherapy: Secondary | ICD-10-CM

## 2020-10-03 DIAGNOSIS — Z5112 Encounter for antineoplastic immunotherapy: Secondary | ICD-10-CM

## 2020-10-03 LAB — CBC WITH DIFFERENTIAL (CANCER CENTER ONLY)
Abs Immature Granulocytes: 0.05 10*3/uL (ref 0.00–0.07)
Basophils Absolute: 0 10*3/uL (ref 0.0–0.1)
Basophils Relative: 1 %
Eosinophils Absolute: 0 10*3/uL (ref 0.0–0.5)
Eosinophils Relative: 1 %
HCT: 33.2 % — ABNORMAL LOW (ref 39.0–52.0)
Hemoglobin: 10.4 g/dL — ABNORMAL LOW (ref 13.0–17.0)
Immature Granulocytes: 1 %
Lymphocytes Relative: 8 %
Lymphs Abs: 0.5 10*3/uL — ABNORMAL LOW (ref 0.7–4.0)
MCH: 28.8 pg (ref 26.0–34.0)
MCHC: 31.3 g/dL (ref 30.0–36.0)
MCV: 92 fL (ref 80.0–100.0)
Monocytes Absolute: 0.7 10*3/uL (ref 0.1–1.0)
Monocytes Relative: 10 %
Neutro Abs: 5.3 10*3/uL (ref 1.7–7.7)
Neutrophils Relative %: 79 %
Platelet Count: 227 10*3/uL (ref 150–400)
RBC: 3.61 MIL/uL — ABNORMAL LOW (ref 4.22–5.81)
RDW: 18.8 % — ABNORMAL HIGH (ref 11.5–15.5)
WBC Count: 6.5 10*3/uL (ref 4.0–10.5)
nRBC: 0 % (ref 0.0–0.2)

## 2020-10-03 LAB — CMP (CANCER CENTER ONLY)
ALT: 14 U/L (ref 0–44)
AST: 24 U/L (ref 15–41)
Albumin: 2.7 g/dL — ABNORMAL LOW (ref 3.5–5.0)
Alkaline Phosphatase: 118 U/L (ref 38–126)
Anion gap: 9 (ref 5–15)
BUN: 18 mg/dL (ref 8–23)
CO2: 31 mmol/L (ref 22–32)
Calcium: 8.9 mg/dL (ref 8.9–10.3)
Chloride: 102 mmol/L (ref 98–111)
Creatinine: 1.31 mg/dL — ABNORMAL HIGH (ref 0.61–1.24)
GFR, Estimated: 56 mL/min — ABNORMAL LOW (ref >60)
Glucose, Bld: 116 mg/dL — ABNORMAL HIGH (ref 70–99)
Potassium: 3.4 mmol/L — ABNORMAL LOW (ref 3.5–5.1)
Sodium: 142 mmol/L (ref 135–145)
Total Bilirubin: 0.6 mg/dL (ref 0.3–1.2)
Total Protein: 6.4 g/dL — ABNORMAL LOW (ref 6.5–8.1)

## 2020-10-03 MED ORDER — HYDROCODONE-HOMATROPINE 5-1.5 MG/5ML PO SYRP: 5.0000 mL | ORAL_SOLUTION | Freq: Four times a day (QID) | ORAL | 0 refills | Status: DC | PRN

## 2020-10-03 MED ORDER — PEGFILGRASTIM 6 MG/0.6ML ~~LOC~~ PSKT
PREFILLED_SYRINGE | SUBCUTANEOUS | Status: AC
  Filled 2020-10-03: qty 0.6

## 2020-10-03 MED ORDER — PROCHLORPERAZINE MALEATE 10 MG PO TABS: 10.0000 mg | ORAL_TABLET | Freq: Four times a day (QID) | ORAL | 3 refills | Status: DC | PRN

## 2020-10-03 MED ORDER — SODIUM CHLORIDE 0.9 % IV SOLN
150.0000 mg | Freq: Once | INTRAVENOUS | Status: AC
  Administered 2020-10-03: 150 mg via INTRAVENOUS
  Filled 2020-10-03: qty 150

## 2020-10-03 MED ORDER — SODIUM CHLORIDE 0.9 % IV SOLN
10.0000 mg | Freq: Once | INTRAVENOUS | Status: AC
  Administered 2020-10-03: 10 mg via INTRAVENOUS
  Filled 2020-10-03: qty 10

## 2020-10-03 MED ORDER — SODIUM CHLORIDE 0.9 % IV SOLN
Freq: Once | INTRAVENOUS | Status: AC
  Filled 2020-10-03: qty 250

## 2020-10-03 MED ORDER — CYANOCOBALAMIN 1000 MCG/ML IJ SOLN
1000.0000 ug | Freq: Once | INTRAMUSCULAR | Status: AC
  Administered 2020-10-03: 1000 ug via INTRAMUSCULAR

## 2020-10-03 MED ORDER — PEGFILGRASTIM 6 MG/0.6ML ~~LOC~~ PSKT
6.0000 mg | PREFILLED_SYRINGE | Freq: Once | SUBCUTANEOUS | Status: AC
  Administered 2020-10-03: 6 mg via SUBCUTANEOUS

## 2020-10-03 MED ORDER — SODIUM CHLORIDE 0.9 % IV SOLN
423.5000 mg | Freq: Once | INTRAVENOUS | Status: AC
  Administered 2020-10-03: 420 mg via INTRAVENOUS
  Filled 2020-10-03: qty 42

## 2020-10-03 MED ORDER — PALONOSETRON HCL INJECTION 0.25 MG/5ML
INTRAVENOUS | Status: AC
  Filled 2020-10-03: qty 5

## 2020-10-03 MED ORDER — CYANOCOBALAMIN 1000 MCG/ML IJ SOLN
INTRAMUSCULAR | Status: AC
  Filled 2020-10-03: qty 1

## 2020-10-03 MED ORDER — SODIUM CHLORIDE 0.9 % IV SOLN
500.0000 mg/m2 | Freq: Once | INTRAVENOUS | Status: AC
  Administered 2020-10-03: 1100 mg via INTRAVENOUS
  Filled 2020-10-03: qty 40

## 2020-10-03 MED ORDER — PALONOSETRON HCL INJECTION 0.25 MG/5ML
0.2500 mg | Freq: Once | INTRAVENOUS | Status: AC
  Administered 2020-10-03: 0.25 mg via INTRAVENOUS

## 2020-10-03 MED ORDER — SODIUM CHLORIDE 0.9 % IV SOLN
200.0000 mg | Freq: Once | INTRAVENOUS | Status: AC
  Administered 2020-10-03: 200 mg via INTRAVENOUS
  Filled 2020-10-03: qty 8

## 2020-10-03 NOTE — Progress Notes (Signed)
Met with patient/spouse at registration to introduce myself as Arboriculturist and to offer available resources.  Discussed one-time $1000 Radio broadcast assistant to assist with personal expenses while going through treatment.  Gave them my card if interested in applying and for any additional financial questions or concerns.

## 2020-10-03 NOTE — Progress Notes (Signed)
Per Cassie Heiligoepter, PA, ok to treat with HR 109.

## 2020-10-03 NOTE — Patient Instructions (Addendum)
PLEASE REMOVE NEULASTA ONPRO AFTER 6:30PM ON Friday 10/04/20.   [facility] Discharge Instructions for Patients Receiving Chemotherapy  Today you received the following chemotherapy agents: Keytruda, Alimta, Carboplatin   To help prevent nausea and vomiting after your treatment, we encourage you to take your nausea medication as directed.    If you develop nausea and vomiting that is not controlled by your nausea medication, call the clinic.   BELOW ARE SYMPTOMS THAT SHOULD BE REPORTED IMMEDIATELY:  *FEVER GREATER THAN 100.5 F  *CHILLS WITH OR WITHOUT FEVER  NAUSEA AND VOMITING THAT IS NOT CONTROLLED WITH YOUR NAUSEA MEDICATION  *UNUSUAL SHORTNESS OF BREATH  *UNUSUAL BRUISING OR BLEEDING  TENDERNESS IN MOUTH AND THROAT WITH OR WITHOUT PRESENCE OF ULCERS  *URINARY PROBLEMS  *BOWEL PROBLEMS  UNUSUAL RASH Items with * indicate a potential emergency and should be followed up as soon as possible.  Feel free to call the clinic should you have any questions or concerns. The clinic phone number is [phone].  Please show the [facility] at check-in to the Emergency Department and triage nurse.

## 2020-10-04 ENCOUNTER — Other Ambulatory Visit: Payer: Self-pay

## 2020-10-04 ENCOUNTER — Other Ambulatory Visit: Payer: Self-pay | Admitting: Physician Assistant

## 2020-10-04 ENCOUNTER — Telehealth: Payer: Self-pay | Admitting: Physician Assistant

## 2020-10-04 ENCOUNTER — Inpatient Hospital Stay: Payer: Medicare Other

## 2020-10-04 DIAGNOSIS — R059 Cough, unspecified: Secondary | ICD-10-CM

## 2020-10-04 DIAGNOSIS — C3492 Malignant neoplasm of unspecified part of left bronchus or lung: Secondary | ICD-10-CM

## 2020-10-04 DIAGNOSIS — Z5111 Encounter for antineoplastic chemotherapy: Secondary | ICD-10-CM | POA: Diagnosis not present

## 2020-10-04 MED ORDER — HYDROCODONE-HOMATROPINE 5-1.5 MG/5ML PO SYRP: 5.0000 mL | ORAL_SOLUTION | Freq: Four times a day (QID) | ORAL | 0 refills | Status: DC | PRN

## 2020-10-04 MED ORDER — PEGFILGRASTIM INJECTION 6 MG/0.6ML ~~LOC~~
6.0000 mg | PREFILLED_SYRINGE | Freq: Once | SUBCUTANEOUS | Status: AC
  Administered 2020-10-04: 6 mg via SUBCUTANEOUS

## 2020-10-04 NOTE — Telephone Encounter (Signed)
I received a message today that the patient's on pro fell off while he was sleeping last night.  The patient completed cycle #3 of his chemotherapy yesterday.  We would recommend for the patient to come in for a Neulasta injection either today 10/04/20 after 2:30 PM or tomorrow morning (10/05/2020).  The patient was instructed to bring his on pro back to the clinic so he can be reimbursed.  I have sent a message to scheduling to make him an injection appointment.

## 2020-10-04 NOTE — Patient Instructions (Signed)

## 2020-10-04 NOTE — Progress Notes (Signed)
Patient returned the [facility] device today and was given a Neulasta injection. Sent to pharmacy.

## 2020-10-04 NOTE — Progress Notes (Signed)
..  Neulasta OnPro DOS: 10/03/2020. Per patient device mal-function occurred due to feel off arm while sleeping 11/11, did not dispense any medication. [employer] replacing device. Original device returned to 10/04/2020 & new device to replace this dose being shipped. Case # [id number].

## 2020-10-05 ENCOUNTER — Ambulatory Visit: Payer: Medicare Other

## 2020-10-05 DIAGNOSIS — C3412 Malignant neoplasm of upper lobe, left bronchus or lung: Secondary | ICD-10-CM | POA: Diagnosis not present

## 2020-10-07 ENCOUNTER — Other Ambulatory Visit: Payer: Self-pay | Admitting: Thoracic Surgery (Cardiothoracic Vascular Surgery)

## 2020-10-07 DIAGNOSIS — J90 Pleural effusion, not elsewhere classified: Secondary | ICD-10-CM

## 2020-10-08 ENCOUNTER — Ambulatory Visit (INDEPENDENT_AMBULATORY_CARE_PROVIDER_SITE_OTHER): Payer: Medicare Other | Admitting: Thoracic Surgery (Cardiothoracic Vascular Surgery)

## 2020-10-08 ENCOUNTER — Ambulatory Visit
Admission: RE | Admit: 2020-10-08 | Discharge: 2020-10-08 | Disposition: A | Discharge status: Home / Self Care | Payer: Medicare Other | Source: Ambulatory Visit | Attending: Cardiothoracic Surgery | Admitting: Cardiothoracic Surgery

## 2020-10-08 ENCOUNTER — Other Ambulatory Visit: Payer: Self-pay

## 2020-10-08 VITALS — BP 92/61 | HR 92 | Temp 98.1°F | Resp 20 | Ht 75.0 in | Wt 185.0 lb

## 2020-10-08 DIAGNOSIS — I6523 Occlusion and stenosis of bilateral carotid arteries: Secondary | ICD-10-CM

## 2020-10-08 DIAGNOSIS — J90 Pleural effusion, not elsewhere classified: Secondary | ICD-10-CM

## 2020-10-08 DIAGNOSIS — C3492 Malignant neoplasm of unspecified part of left bronchus or lung: Secondary | ICD-10-CM

## 2020-10-08 DIAGNOSIS — J910 Malignant pleural effusion: Secondary | ICD-10-CM

## 2020-10-08 NOTE — Progress Notes (Signed)
[address]       [place] [address]             [phone]    HPI: Mr. Charles Robinson returns for follow-up of his malignant pleural effusion.  Charles Robinson is a 74 year old man with stage IV lung cancer and a malignant left pleural effusion.  I placed a pleural catheter on 08/05/2020.  He drained a fair amount up until the end of October when his drainage dropped off dramatically.  He had a small residual effusion.  He is not draining the catheter for 2 weeks and now returns with a repeat chest x-ray.  Over the past 2 weeks he has noted no worsening of his respiratory status.  He has been breathing comfortably.  Past Medical History:  Diagnosis Date  . Arthritis    back  . BPH (benign prostatic hypertrophy)   . CAD S/P multivessel PCI: LAD, RCA and extensive LCX-OM1 03/15/2012   s/p PCI to LAD, to RCA (now occluded), then extensive PCI to Dominant LCx-OM1  (enitre prox-AVG Circ for dissection in 07/2011)  . Cancer ([place])    SKIN CA  . Dyspnea   . Dysrhythmia    afib  . GERD (gastroesophageal reflux disease)   . Heart murmur   . History of colon polyps   . History of echocardiogram 08/16/2011   Echo - EF 60-65%; moderate LV concentric hypertrophy; abdnormal LV relaxation (grade 1 diastolic dysfunction; ascending aorta mildly dilated; LA moderately dilated;   . Hyperlipidemia   . Hypertension   . 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 PAF (paroxysmal atrial fibrillation) ([place]) 12/20/2012   followed by Dr. Glenetta Hew; s/p DCCV 2011, 07/2011 post PCI with Type 4a MI; CHA2DS2Vasc = 3, on Warfarin  . Paroxysmal atrial flutter ([place]) 08/20/2011   TEE- atrial septum - no defect identified; RA normal in size, no evidence of thrombus; ascending aorta normal  . Prostate cancer ([place])   . Rupture quadriceps tendon     Current Outpatient Medications  Medication Sig Dispense Refill  .  acetaminophen (TYLENOL) 325 MG tablet Take 650 mg by mouth every 6 (six) hours as needed for moderate pain.    [name] amiodarone (PACERONE) 200 MG tablet Take 1 tablet (200 mg total) by mouth daily.    . Coenzyme Q10 200 MG capsule Take 200 mg by mouth every morning.     [name] ELIQUIS 5 MG TABS tablet TAKE 1 TABLET BY MOUTH TWICE A DAY (Patient taking differently: Take 5 mg by mouth 2 (two) times daily. ) 180 tablet 2  . ezetimibe (ZETIA) 10 MG tablet Take 1 tablet (10 mg total) by mouth daily. 90 tablet 2  . folic acid (FOLVITE) 1 MG tablet Take 1 tablet (1 mg total) by mouth daily. 30 tablet 4  . HYDROcodone-homatropine (HYCODAN) 5-1.5 MG/5ML syrup Take 5 mLs by mouth every 6 (six) hours as needed for cough. 120 mL 0  . hydroxypropyl methylcellulose / hypromellose (ISOPTO TEARS / GONIOVISC) 2.5 % ophthalmic solution Place 1 drop into both eyes 3 (three) times daily as needed for dry eyes.    [name] Leuprolide Acetate (ELIGARD [place]) Inject into the skin.     . mirtazapine (REMERON) 30 MG tablet TAKE 1 TABLET BY MOUTH AT BEDTIME. 90 tablet 1  . Multiple Vitamin (MULTIVITAMIN WITH MINERALS) TABS tablet Take 1 tablet by mouth every morning.     [name]  nitroGLYCERIN (NITROSTAT) 0.4 MG SL tablet PLACE 1 TABLET (0.4 MG TOTAL) UNDER THE TONGUE EVERY 5 (FIVE) MINUTES AS NEEDED. FOR CHEST PAIN 25 tablet 4  . Omega-3 Fatty Acids (FISH OIL) 1000 MG CAPS Take 1 capsule (1,000 mg total) by mouth 2 (two) times daily. 60 capsule 0  . prochlorperazine (COMPAZINE) 10 MG tablet Take 1 tablet (10 mg total) by mouth every 6 (six) hours as needed for nausea or vomiting. 30 tablet 3  . rosuvastatin (CRESTOR) 40 MG tablet TAKE 1 TABLET BY MOUTH EVERY DAY 90 tablet 3  . tamsulosin (FLOMAX) 0.4 MG CAPS capsule Take 0.4 mg by mouth daily.     No current facility-administered medications for this visit.    Physical Exam BP 92/61   Pulse 92   Temp 98.1 F (36.7 C) (Skin)   Resp 20   Ht 6\' 3"  (1.905 m)   Wt 185 lb (83.9 kg)   SpO2 97%  Comment: RA  BMI 23.96 kg/m  74 year old man in no acute distress Alert and oriented x3 with no focal deficits Catheter site clean and dry Diminished breath sounds at left base but otherwise clear  Diagnostic Tests: CHEST - 2 VIEW  COMPARISON:  09/20/2020  FINDINGS: Pleural drainage catheter overlies the LEFT lung base and appears unchanged. Heart size is UPPER normal and unchanged. Persistent LEFT pleural effusion appears similar to the previous chest x-ray. Trace RIGHT pleural effusion. There is no pneumothorax. No consolidation or pulmonary edema. Remote RIGHT rib fracture.  IMPRESSION: Bilateral pleural effusions, LEFT greater than RIGHT. No pneumothorax.   Electronically Signed   By: Nolon Nations M.D.   On: 10/08/2020 14:01 I personally reviewed the chest x-ray images.  The left pleural effusion is small and is decreased from his previous chest x-ray.  Impression: Charles Robinson is a 74 year old man with stage IV lung cancer with a malignant left pleural effusion who had a pleural catheter in place.  The catheter is been in place for a couple of months now that it drained for about a month and then dropped off dramatically.  For the past month he has had essentially no drainage.  Despite that his chest x-ray is improved compared to the film from 2 weeks ago.  We discussed removal of the catheter versus top of pleurodesis prior to removing the catheter.  I think in his case, I would avoid talc pleurodesis as there is still some fluid present in the chest and that could complicate things if he were to need an intervention in the future.  Plan: Remove pleural catheter. Return in a month with a PA and lateral chest x-ray  Procedure note  The area around the pleural catheter was prepped.  Cold spray was applied and then the area around the catheter exit site was anesthetized with 2 mL of 1% lidocaine.  The cuff was gently separated from the subcutaneous tissue with  blunt dissection and the catheter was removed intact without difficulty.  Melrose Nakayama, MD [facility] [phone]

## 2020-10-10 ENCOUNTER — Inpatient Hospital Stay: Payer: Medicare Other

## 2020-10-10 ENCOUNTER — Other Ambulatory Visit: Payer: Self-pay

## 2020-10-10 DIAGNOSIS — Z5111 Encounter for antineoplastic chemotherapy: Secondary | ICD-10-CM | POA: Diagnosis not present

## 2020-10-10 DIAGNOSIS — C3492 Malignant neoplasm of unspecified part of left bronchus or lung: Secondary | ICD-10-CM

## 2020-10-10 LAB — CBC WITH DIFFERENTIAL (CANCER CENTER ONLY)
Abs Immature Granulocytes: 0.06 10*3/uL (ref 0.00–0.07)
Basophils Absolute: 0 10*3/uL (ref 0.0–0.1)
Basophils Relative: 0 %
Eosinophils Absolute: 0 10*3/uL (ref 0.0–0.5)
Eosinophils Relative: 0 %
HCT: 26.8 % — ABNORMAL LOW (ref 39.0–52.0)
Hemoglobin: 8.4 g/dL — ABNORMAL LOW (ref 13.0–17.0)
Immature Granulocytes: 2 %
Lymphocytes Relative: 4 %
Lymphs Abs: 0.2 10*3/uL — ABNORMAL LOW (ref 0.7–4.0)
MCH: 29.1 pg (ref 26.0–34.0)
MCHC: 31.3 g/dL (ref 30.0–36.0)
MCV: 92.7 fL (ref 80.0–100.0)
Monocytes Absolute: 0.2 10*3/uL (ref 0.1–1.0)
Monocytes Relative: 6 %
Neutro Abs: 3.6 10*3/uL (ref 1.7–7.7)
Neutrophils Relative %: 88 %
Platelet Count: 55 10*3/uL — ABNORMAL LOW (ref 150–400)
RBC: 2.89 MIL/uL — ABNORMAL LOW (ref 4.22–5.81)
RDW: 19.3 % — ABNORMAL HIGH (ref 11.5–15.5)
WBC Count: 4.1 10*3/uL (ref 4.0–10.5)
nRBC: 0 % (ref 0.0–0.2)

## 2020-10-10 LAB — CMP (CANCER CENTER ONLY)
ALT: 14 U/L (ref 0–44)
AST: 31 U/L (ref 15–41)
Albumin: 2.5 g/dL — ABNORMAL LOW (ref 3.5–5.0)
Alkaline Phosphatase: 117 U/L (ref 38–126)
Anion gap: 8 (ref 5–15)
BUN: 29 mg/dL — ABNORMAL HIGH (ref 8–23)
CO2: 29 mmol/L (ref 22–32)
Calcium: 8.4 mg/dL — ABNORMAL LOW (ref 8.9–10.3)
Chloride: 105 mmol/L (ref 98–111)
Creatinine: 1.17 mg/dL (ref 0.61–1.24)
GFR, Estimated: >60 mL/min (ref >60)
Glucose, Bld: 103 mg/dL — ABNORMAL HIGH (ref 70–99)
Potassium: 3.6 mmol/L (ref 3.5–5.1)
Sodium: 142 mmol/L (ref 135–145)
Total Bilirubin: 0.6 mg/dL (ref 0.3–1.2)
Total Protein: 6 g/dL — ABNORMAL LOW (ref 6.5–8.1)

## 2020-10-16 ENCOUNTER — Other Ambulatory Visit: Payer: Self-pay

## 2020-10-16 ENCOUNTER — Telehealth: Payer: Self-pay | Admitting: *Deleted

## 2020-10-16 ENCOUNTER — Inpatient Hospital Stay: Payer: Medicare Other

## 2020-10-16 DIAGNOSIS — Z5111 Encounter for antineoplastic chemotherapy: Secondary | ICD-10-CM | POA: Diagnosis not present

## 2020-10-16 DIAGNOSIS — C3492 Malignant neoplasm of unspecified part of left bronchus or lung: Secondary | ICD-10-CM

## 2020-10-16 DIAGNOSIS — R5382 Chronic fatigue, unspecified: Secondary | ICD-10-CM

## 2020-10-16 LAB — CBC WITH DIFFERENTIAL (CANCER CENTER ONLY)
Abs Immature Granulocytes: 0.02 10*3/uL (ref 0.00–0.07)
Basophils Absolute: 0 10*3/uL (ref 0.0–0.1)
Basophils Relative: 0 %
Eosinophils Absolute: 0 10*3/uL (ref 0.0–0.5)
Eosinophils Relative: 0 %
HCT: 26.5 % — ABNORMAL LOW (ref 39.0–52.0)
Hemoglobin: 8.4 g/dL — ABNORMAL LOW (ref 13.0–17.0)
Immature Granulocytes: 1 %
Lymphocytes Relative: 7 %
Lymphs Abs: 0.2 10*3/uL — ABNORMAL LOW (ref 0.7–4.0)
MCH: 29.5 pg (ref 26.0–34.0)
MCHC: 31.7 g/dL (ref 30.0–36.0)
MCV: 93 fL (ref 80.0–100.0)
Monocytes Absolute: 0.3 10*3/uL (ref 0.1–1.0)
Monocytes Relative: 10 %
Neutro Abs: 2.4 10*3/uL (ref 1.7–7.7)
Neutrophils Relative %: 82 %
Platelet Count: 53 10*3/uL — ABNORMAL LOW (ref 150–400)
RBC: 2.85 MIL/uL — ABNORMAL LOW (ref 4.22–5.81)
RDW: 18.8 % — ABNORMAL HIGH (ref 11.5–15.5)
WBC Count: 2.9 10*3/uL — ABNORMAL LOW (ref 4.0–10.5)
nRBC: 0 % (ref 0.0–0.2)

## 2020-10-16 LAB — CMP (CANCER CENTER ONLY)
ALT: 17 U/L (ref 0–44)
AST: 28 U/L (ref 15–41)
Albumin: 2.7 g/dL — ABNORMAL LOW (ref 3.5–5.0)
Alkaline Phosphatase: 125 U/L (ref 38–126)
Anion gap: 10 (ref 5–15)
BUN: 15 mg/dL (ref 8–23)
CO2: 29 mmol/L (ref 22–32)
Calcium: 9 mg/dL (ref 8.9–10.3)
Chloride: 103 mmol/L (ref 98–111)
Creatinine: 1.14 mg/dL (ref 0.61–1.24)
GFR, Estimated: >60 mL/min (ref >60)
Glucose, Bld: 114 mg/dL — ABNORMAL HIGH (ref 70–99)
Potassium: 3.1 mmol/L — ABNORMAL LOW (ref 3.5–5.1)
Sodium: 142 mmol/L (ref 135–145)
Total Bilirubin: 0.5 mg/dL (ref 0.3–1.2)
Total Protein: 6.7 g/dL (ref 6.5–8.1)

## 2020-10-16 LAB — TSH: TSH: 2.707 u[IU]/mL (ref 0.320–4.118)

## 2020-10-16 NOTE — Telephone Encounter (Signed)
I called to check on Charles Robinson.  I was unable to reach but did leave vm message with my name to call if needed.

## 2020-10-18 ENCOUNTER — Telehealth: Payer: Self-pay | Admitting: *Deleted

## 2020-10-18 NOTE — Telephone Encounter (Signed)
-----   Message from Ardeen Garland, RN sent at 10/18/2020 10:19 AM EST -----  ----- Message ----- From: Curt Bears, MD Sent: 10/16/2020   4:20 PM EST To: Ardeen Garland, RN, #  Please call the patient and make sure he increase the potassium supplements in his diet.  Thank you. ----- Message ----- From: Buel Ream, Lab In [place] Sent: 10/16/2020   9:12 AM EST To: Curt Bears, MD

## 2020-10-18 NOTE — Telephone Encounter (Signed)
LM with note below 

## 2020-10-21 ENCOUNTER — Telehealth: Payer: Self-pay

## 2020-10-21 NOTE — Telephone Encounter (Signed)
I spoke with pts wife and advised as indicated. She stated pt has been eating a couple banana's a day. I also advise of some additional sources of potassium. She expressed understanding of this information.

## 2020-10-21 NOTE — Telephone Encounter (Signed)
-----   Message from Curt Bears, MD sent at 10/16/2020  4:20 PM EST ----- Please call the patient and make sure he increase the potassium supplements in his diet.  Thank you. ----- Message ----- From: Buel Ream, Lab In [place] Sent: 10/16/2020   9:12 AM EST To: Curt Bears, MD

## 2020-10-23 ENCOUNTER — Encounter (HOSPITAL_COMMUNITY): Payer: Self-pay

## 2020-10-23 ENCOUNTER — Other Ambulatory Visit: Payer: Self-pay

## 2020-10-23 ENCOUNTER — Encounter: Payer: Self-pay | Admitting: Internal Medicine

## 2020-10-23 ENCOUNTER — Telehealth: Payer: Self-pay

## 2020-10-23 ENCOUNTER — Ambulatory Visit (HOSPITAL_COMMUNITY)
Admission: RE | Admit: 2020-10-23 | Discharge: 2020-10-23 | Disposition: A | Discharge status: Home / Self Care | Payer: Medicare Other | Source: Ambulatory Visit | Attending: Physician Assistant | Admitting: Physician Assistant

## 2020-10-23 DIAGNOSIS — C3492 Malignant neoplasm of unspecified part of left bronchus or lung: Secondary | ICD-10-CM

## 2020-10-23 MED ORDER — IOHEXOL 300 MG/ML  SOLN
100.0000 mL | Freq: Once | INTRAMUSCULAR | Status: AC | PRN
  Administered 2020-10-23: 100 mL via INTRAVENOUS

## 2020-10-23 NOTE — Telephone Encounter (Signed)
-----   Message from Curt Bears, MD sent at 10/22/2020 10:09 PM EST ----- Regarding: RE: update Yes. Drink as much as he can. We can bring him to [facility] for IVF. Thank you ----- Message ----- From: Valrie Hart, RN Sent: 10/22/2020   3:38 PM EST To: Curt Bears, MD Subject: update                                         Hi Dr. Julien Nordmann, I received a call from Ms. Cherry.  She states that her husband is not doing well.  He is in the bed most of the time and is not eating.  He has a scan tomorrow but she is concerned he can not drink all the contrast.  Can I tell her just to have him do the best he can with that?  Also, she is checking to see if his treatment can be every 4 weeks instead of 3 weeks.  She states she is very concerned about him and not sure what to do.  Let me know your thoughts and I can call her with an update. Thanks D

## 2020-10-23 NOTE — Telephone Encounter (Signed)
I spoke with pts wife and advised as indicated. She is also aware pt has an OV and treatment scheduled for tomorrow.

## 2020-10-24 ENCOUNTER — Other Ambulatory Visit: Payer: Self-pay

## 2020-10-24 ENCOUNTER — Inpatient Hospital Stay (HOSPITAL_BASED_OUTPATIENT_CLINIC_OR_DEPARTMENT_OTHER): Payer: Medicare Other | Admitting: Internal Medicine

## 2020-10-24 ENCOUNTER — Telehealth: Payer: Self-pay

## 2020-10-24 ENCOUNTER — Inpatient Hospital Stay: Payer: Medicare Other

## 2020-10-24 ENCOUNTER — Inpatient Hospital Stay: Payer: Medicare Other | Attending: Internal Medicine

## 2020-10-24 ENCOUNTER — Encounter: Payer: Self-pay | Admitting: Internal Medicine

## 2020-10-24 VITALS — BP 104/68 | HR 86 | Temp 97.7°F | Resp 18 | Ht 75.0 in | Wt 179.7 lb

## 2020-10-24 DIAGNOSIS — Z85828 Personal history of other malignant neoplasm of skin: Secondary | ICD-10-CM | POA: Diagnosis not present

## 2020-10-24 DIAGNOSIS — C3492 Malignant neoplasm of unspecified part of left bronchus or lung: Secondary | ICD-10-CM

## 2020-10-24 DIAGNOSIS — I480 Paroxysmal atrial fibrillation: Secondary | ICD-10-CM | POA: Diagnosis not present

## 2020-10-24 DIAGNOSIS — K219 Gastro-esophageal reflux disease without esophagitis: Secondary | ICD-10-CM | POA: Insufficient documentation

## 2020-10-24 DIAGNOSIS — M1990 Unspecified osteoarthritis, unspecified site: Secondary | ICD-10-CM | POA: Insufficient documentation

## 2020-10-24 DIAGNOSIS — Z5189 Encounter for other specified aftercare: Secondary | ICD-10-CM | POA: Insufficient documentation

## 2020-10-24 DIAGNOSIS — Z7901 Long term (current) use of anticoagulants: Secondary | ICD-10-CM | POA: Insufficient documentation

## 2020-10-24 DIAGNOSIS — Z5112 Encounter for antineoplastic immunotherapy: Secondary | ICD-10-CM | POA: Insufficient documentation

## 2020-10-24 DIAGNOSIS — Z79899 Other long term (current) drug therapy: Secondary | ICD-10-CM | POA: Diagnosis not present

## 2020-10-24 DIAGNOSIS — Z5111 Encounter for antineoplastic chemotherapy: Secondary | ICD-10-CM | POA: Diagnosis present

## 2020-10-24 DIAGNOSIS — E785 Hyperlipidemia, unspecified: Secondary | ICD-10-CM | POA: Insufficient documentation

## 2020-10-24 DIAGNOSIS — C3412 Malignant neoplasm of upper lobe, left bronchus or lung: Secondary | ICD-10-CM | POA: Insufficient documentation

## 2020-10-24 DIAGNOSIS — C7951 Secondary malignant neoplasm of bone: Secondary | ICD-10-CM | POA: Diagnosis not present

## 2020-10-24 DIAGNOSIS — Z8546 Personal history of malignant neoplasm of prostate: Secondary | ICD-10-CM | POA: Insufficient documentation

## 2020-10-24 DIAGNOSIS — I6523 Occlusion and stenosis of bilateral carotid arteries: Secondary | ICD-10-CM | POA: Diagnosis not present

## 2020-10-24 DIAGNOSIS — I2510 Atherosclerotic heart disease of native coronary artery without angina pectoris: Secondary | ICD-10-CM | POA: Diagnosis not present

## 2020-10-24 DIAGNOSIS — J90 Pleural effusion, not elsewhere classified: Secondary | ICD-10-CM | POA: Diagnosis not present

## 2020-10-24 DIAGNOSIS — I10 Essential (primary) hypertension: Secondary | ICD-10-CM | POA: Diagnosis not present

## 2020-10-24 LAB — CBC WITH DIFFERENTIAL (CANCER CENTER ONLY)
Abs Immature Granulocytes: 0.02 10*3/uL (ref 0.00–0.07)
Basophils Absolute: 0 10*3/uL (ref 0.0–0.1)
Basophils Relative: 0 %
Eosinophils Absolute: 0 10*3/uL (ref 0.0–0.5)
Eosinophils Relative: 0 %
HCT: 27.3 % — ABNORMAL LOW (ref 39.0–52.0)
Hemoglobin: 8.6 g/dL — ABNORMAL LOW (ref 13.0–17.0)
Immature Granulocytes: 0 %
Lymphocytes Relative: 10 %
Lymphs Abs: 0.4 10*3/uL — ABNORMAL LOW (ref 0.7–4.0)
MCH: 30.1 pg (ref 26.0–34.0)
MCHC: 31.5 g/dL (ref 30.0–36.0)
MCV: 95.5 fL (ref 80.0–100.0)
Monocytes Absolute: 0.5 10*3/uL (ref 0.1–1.0)
Monocytes Relative: 11 %
Neutro Abs: 3.6 10*3/uL (ref 1.7–7.7)
Neutrophils Relative %: 79 %
Platelet Count: 155 10*3/uL (ref 150–400)
RBC: 2.86 MIL/uL — ABNORMAL LOW (ref 4.22–5.81)
RDW: 20.2 % — ABNORMAL HIGH (ref 11.5–15.5)
WBC Count: 4.5 10*3/uL (ref 4.0–10.5)
nRBC: 0 % (ref 0.0–0.2)

## 2020-10-24 LAB — CMP (CANCER CENTER ONLY)
ALT: 14 U/L (ref 0–44)
AST: 22 U/L (ref 15–41)
Albumin: 2.7 g/dL — ABNORMAL LOW (ref 3.5–5.0)
Alkaline Phosphatase: 135 U/L — ABNORMAL HIGH (ref 38–126)
Anion gap: 8 (ref 5–15)
BUN: 14 mg/dL (ref 8–23)
CO2: 29 mmol/L (ref 22–32)
Calcium: 9.2 mg/dL (ref 8.9–10.3)
Chloride: 103 mmol/L (ref 98–111)
Creatinine: 1.23 mg/dL (ref 0.61–1.24)
GFR, Estimated: >60 mL/min (ref >60)
Glucose, Bld: 116 mg/dL — ABNORMAL HIGH (ref 70–99)
Potassium: 3.7 mmol/L (ref 3.5–5.1)
Sodium: 140 mmol/L (ref 135–145)
Total Bilirubin: 0.5 mg/dL (ref 0.3–1.2)
Total Protein: 6.5 g/dL (ref 6.5–8.1)

## 2020-10-24 IMAGING — CR DG CHEST 2V
2 series · 2 of 2 positions shown · non-contrast
Comparison: August 02, 2020.

CLINICAL DATA: Left-sided pleural effusion

EXAM:
CHEST - 2 VIEW

[w chest pa]
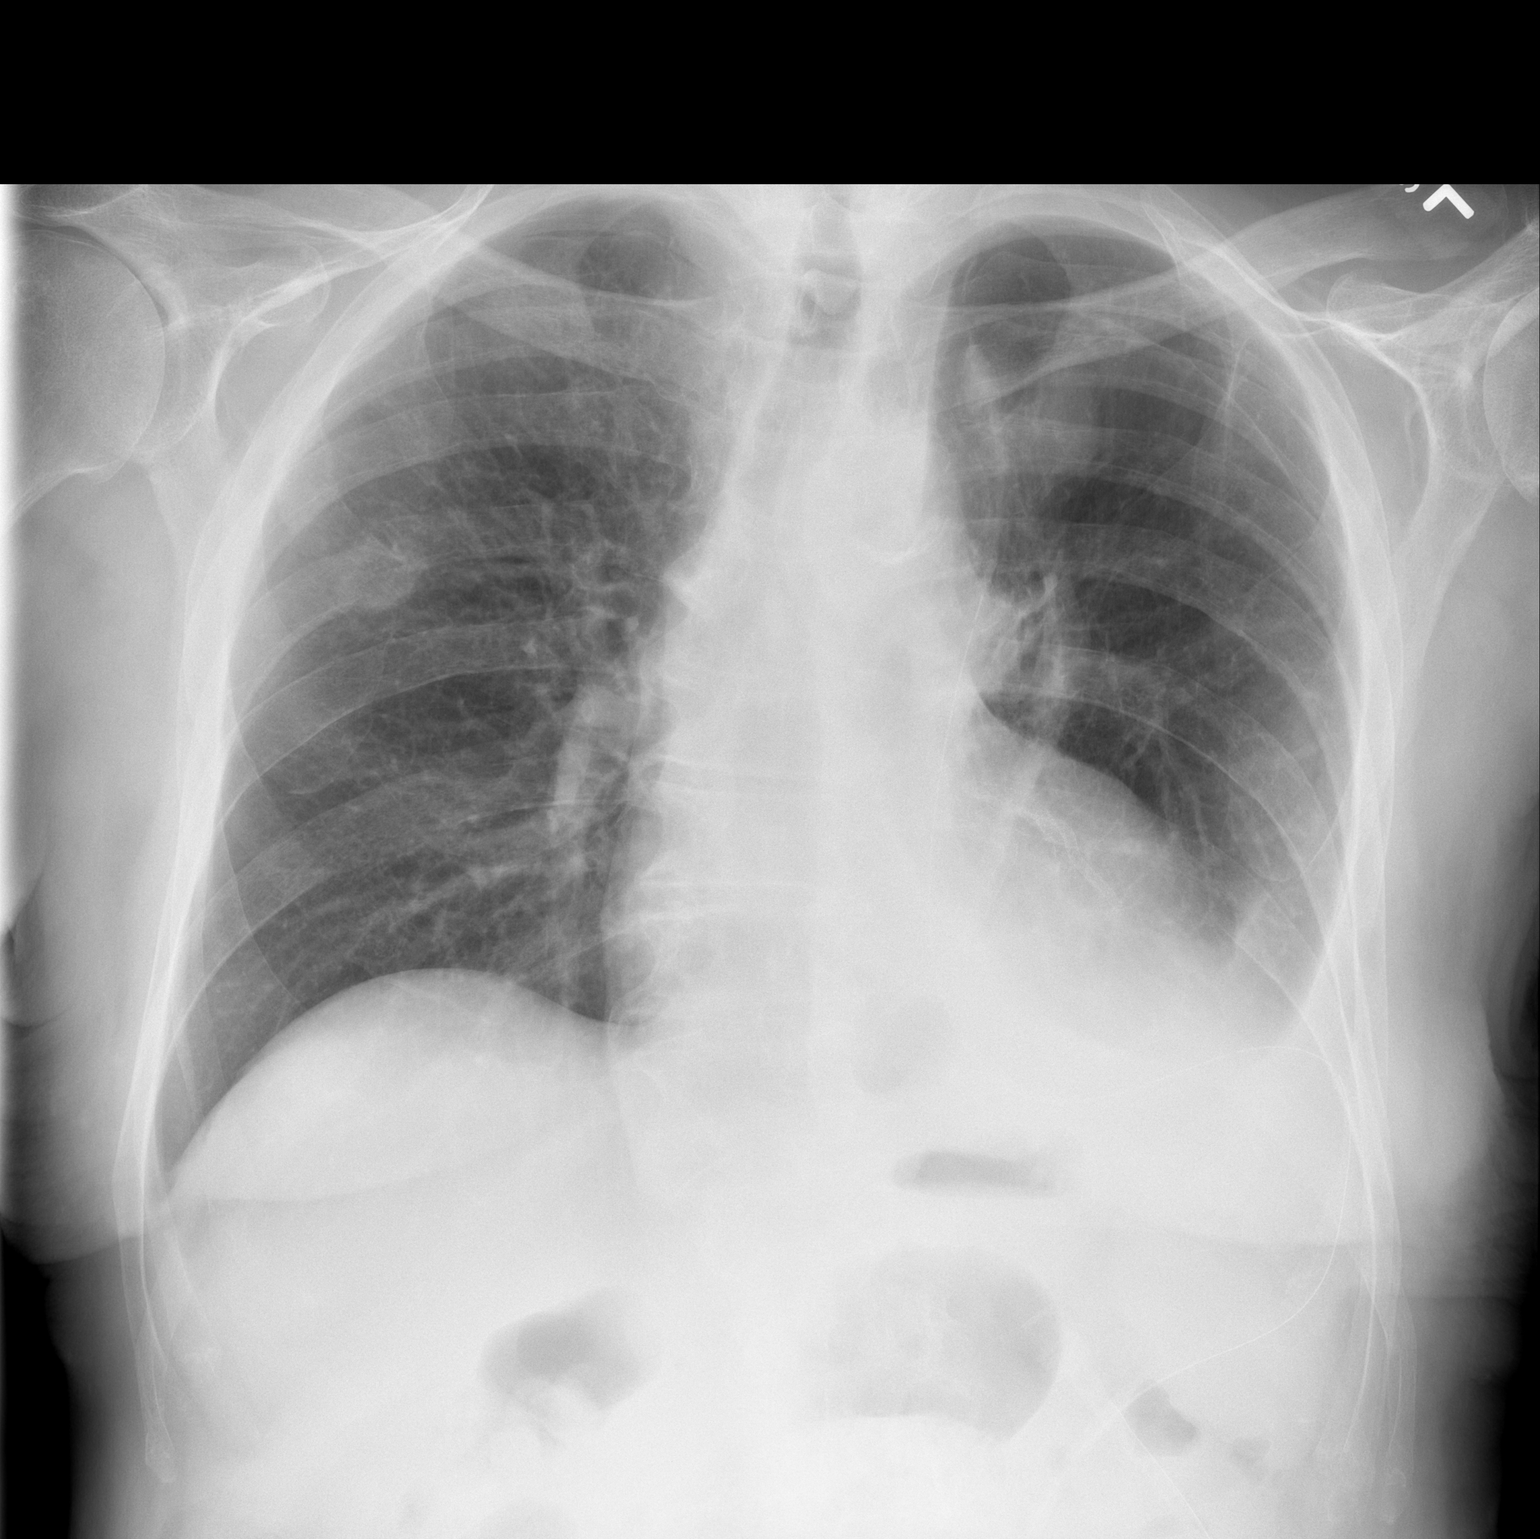

[w chest lat]
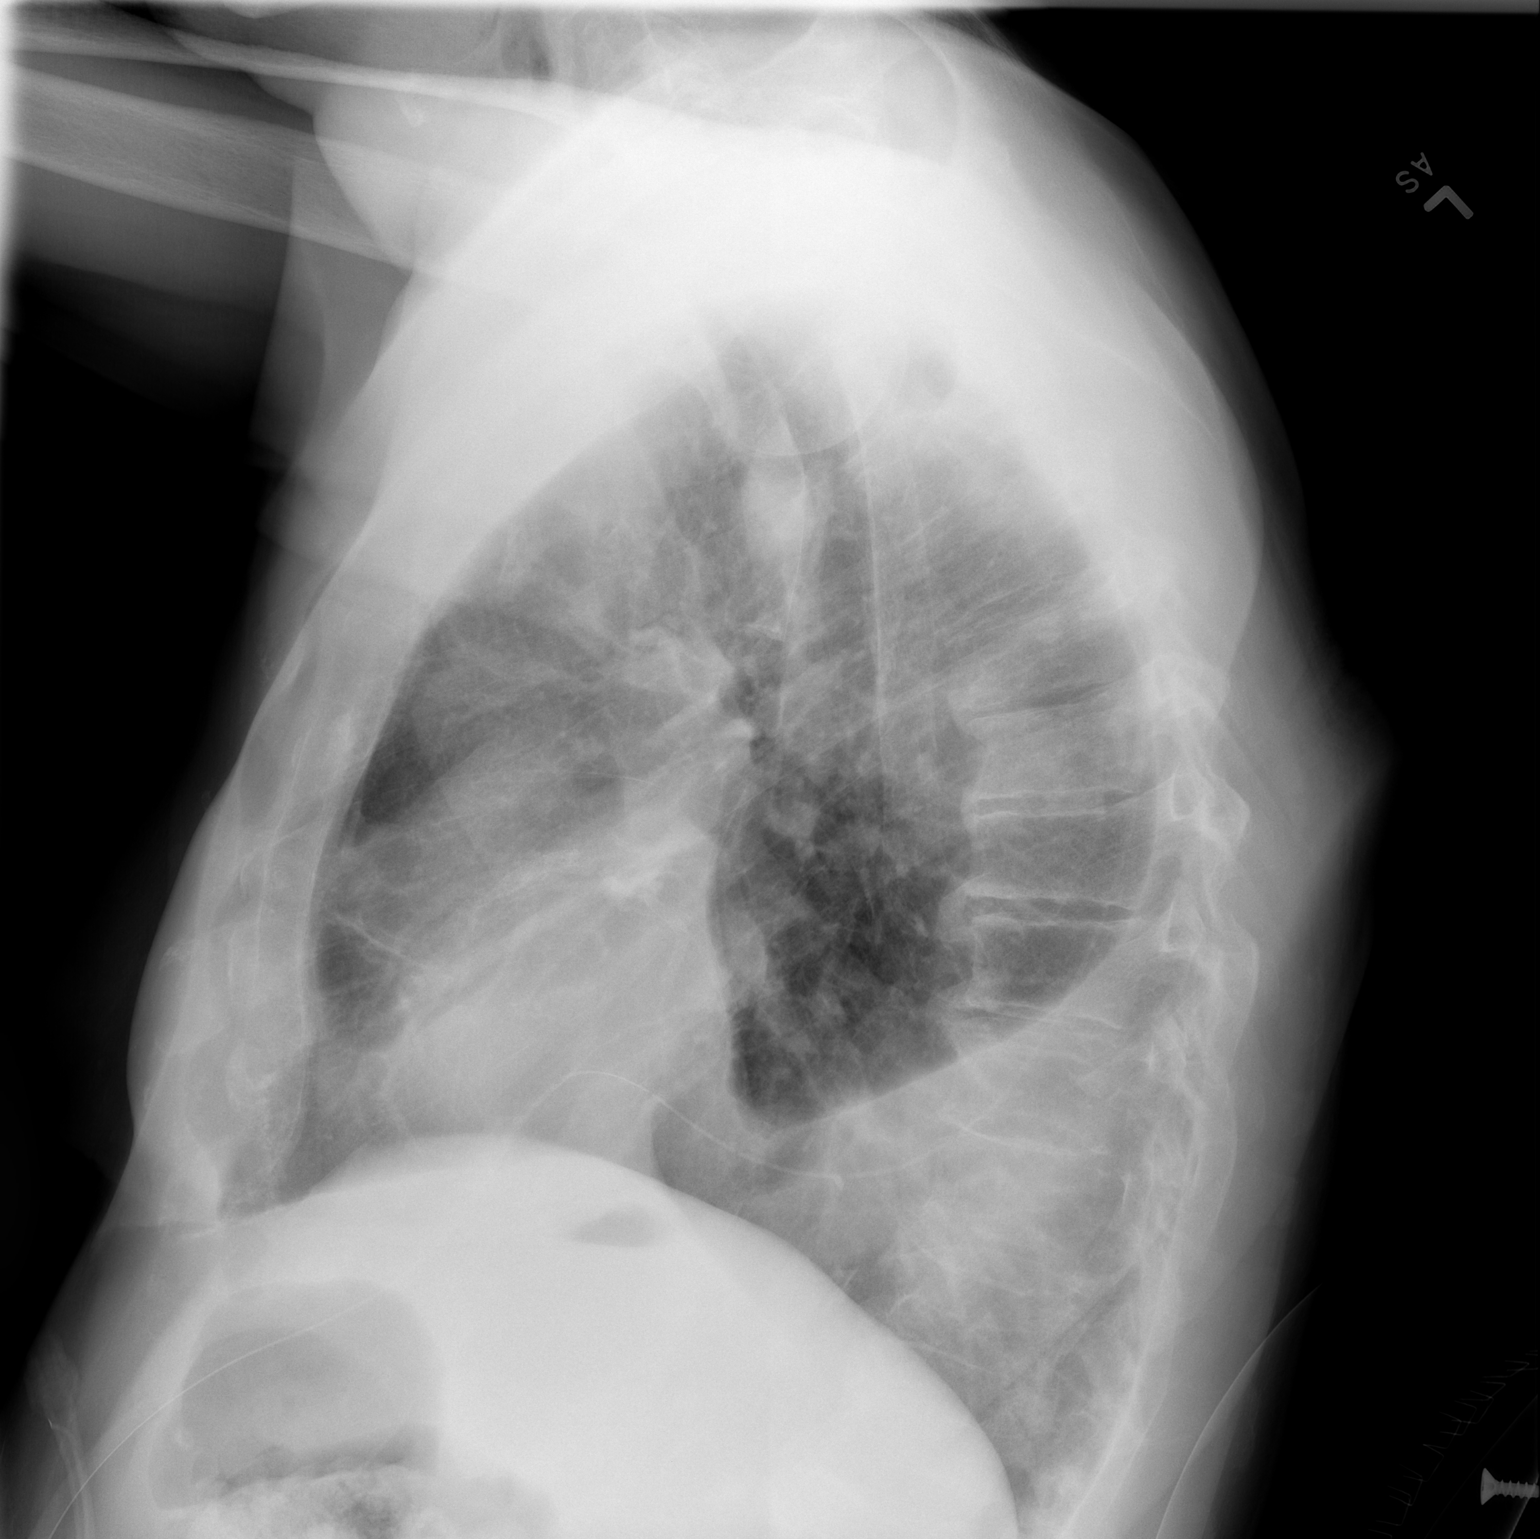

[2 of 2 positions shown; findings below may reference images not displayed]

FINDINGS: There is a PleurX catheter on the left with left pleural effusion
much smaller than on previous study. A small residual left pleural
effusion is noted with atelectatic change in the left base. There is
a suspected chronic loculated pneumothorax in the apical and
apicolateral regions, stable compared to several recent studies.
There is apparent scarring in the left upper lobe medially. New
ill-defined nodular opacity in the medial left upper lobe is
present, measuring 2.7 x 2.3 cm.

Right lung is clear. Heart is upper normal in size with pulmonary
vascularity normal. No adenopathy evident. Aortic atherosclerosis
noted. Apparent healing fracture of the right posterior sixth rib.
IMPRESSION: Significantly smaller left pleural effusion with PleurX catheter
present. Residual effusion with atelectasis left base. Question
loculated pneumothorax in the right apical/apicolateral region,
stable from several prior studies. No tension component.

Scarring left upper lobe. Ill-defined opacity left upper lobe, new
from prior studies. This finding may warrant noncontrast chest CT to
further assess.

Right lung clear.  Stable cardiac silhouette.

Aortic Atherosclerosis ([id number]).

These results will be called to the ordering clinician or
representative by the Radiologist Assistant, and communication
documented in the PACS or [REDACTED].

## 2020-10-24 MED ORDER — PEGFILGRASTIM 6 MG/0.6ML ~~LOC~~ PSKT: 6.0000 mg | PREFILLED_SYRINGE | Freq: Once | SUBCUTANEOUS | Status: DC

## 2020-10-24 MED ORDER — SODIUM CHLORIDE 0.9 % IV SOLN
10.0000 mg | Freq: Once | INTRAVENOUS | Status: AC
  Administered 2020-10-24: 10 mg via INTRAVENOUS
  Filled 2020-10-24: qty 10

## 2020-10-24 MED ORDER — SODIUM CHLORIDE 0.9 % IV SOLN
Freq: Once | INTRAVENOUS | Status: AC
  Filled 2020-10-24: qty 250

## 2020-10-24 MED ORDER — SODIUM CHLORIDE 0.9 % IV SOLN
150.0000 mg | Freq: Once | INTRAVENOUS | Status: AC
  Administered 2020-10-24: 150 mg via INTRAVENOUS
  Filled 2020-10-24: qty 150

## 2020-10-24 MED ORDER — PALONOSETRON HCL INJECTION 0.25 MG/5ML
0.2500 mg | Freq: Once | INTRAVENOUS | Status: AC
  Administered 2020-10-24: 0.25 mg via INTRAVENOUS

## 2020-10-24 MED ORDER — SODIUM CHLORIDE 0.9 % IV SOLN
200.0000 mg | Freq: Once | INTRAVENOUS | Status: AC
  Administered 2020-10-24: 200 mg via INTRAVENOUS
  Filled 2020-10-24: qty 8

## 2020-10-24 MED ORDER — SODIUM CHLORIDE 0.9 % IV SOLN
Freq: Once | INTRAVENOUS | Status: DC
  Filled 2020-10-24: qty 250

## 2020-10-24 MED ORDER — SODIUM CHLORIDE 0.9 % IV SOLN
400.0000 mg/m2 | Freq: Once | INTRAVENOUS | Status: AC
  Administered 2020-10-24: 900 mg via INTRAVENOUS
  Filled 2020-10-24: qty 20

## 2020-10-24 MED ORDER — PEGFILGRASTIM 6 MG/0.6ML ~~LOC~~ PSKT
PREFILLED_SYRINGE | SUBCUTANEOUS | Status: AC
  Filled 2020-10-24: qty 0.6

## 2020-10-24 MED ORDER — SODIUM CHLORIDE 0.9 % IV SOLN
354.4000 mg | Freq: Once | INTRAVENOUS | Status: AC
  Administered 2020-10-24: 350 mg via INTRAVENOUS
  Filled 2020-10-24: qty 35

## 2020-10-24 MED ORDER — SODIUM CHLORIDE 0.9 % IV SOLN
INTRAVENOUS | Status: AC
  Filled 2020-10-24 (×2): qty 250

## 2020-10-24 MED ORDER — PALONOSETRON HCL INJECTION 0.25 MG/5ML
INTRAVENOUS | Status: AC
  Filled 2020-10-24: qty 5

## 2020-10-24 NOTE — Patient Instructions (Signed)
[facility] Discharge Instructions for Patients Receiving Chemotherapy  Today you received the following chemotherapy agents Pembrolizumab (KEYTRUDA), Pemetrexed (ALIMTA) & Carboplatin (PARAPLATIN).  To help prevent nausea and vomiting after your treatment, we encourage you to take your nausea medication as prescribed.   If you develop nausea and vomiting that is not controlled by your nausea medication, call the clinic.   BELOW ARE SYMPTOMS THAT SHOULD BE REPORTED IMMEDIATELY:  *FEVER GREATER THAN 100.5 F  *CHILLS WITH OR WITHOUT FEVER  NAUSEA AND VOMITING THAT IS NOT CONTROLLED WITH YOUR NAUSEA MEDICATION  *UNUSUAL SHORTNESS OF BREATH  *UNUSUAL BRUISING OR BLEEDING  TENDERNESS IN MOUTH AND THROAT WITH OR WITHOUT PRESENCE OF ULCERS  *URINARY PROBLEMS  *BOWEL PROBLEMS  UNUSUAL RASH Items with * indicate a potential emergency and should be followed up as soon as possible.  Feel free to call the clinic should you have any questions or concerns. The clinic phone number is [phone].  Please show the [facility] at check-in to the Emergency Department and triage nurse.

## 2020-10-24 NOTE — Telephone Encounter (Signed)
Per Dr. Julien Nordmann, patient to receive one unit of blood between today and Friday. Patient sent to lab to have type and screen drawn, blood orders placed, and patient scheduled for a blood transfusion on Saturday 10/26/20 at 8:30. Patient is aware not to remove blue blood bank bracelet.

## 2020-10-24 NOTE — Progress Notes (Signed)
[facility] [phone]   [phone]  OFFICE PROGRESS NOTE  [name], MD [place] [address]  DIAGNOSIS: Stage IV (T3, N2, M1c) non-small cell lung cancer, adenocarcinoma presented with left upper lobe lung mass with occlusion of the left upper lobe bronchus and near complete left lower lobe atelectasis in addition to mediastinal lymphadenopathy in the AP window and suspicious malignant left pleural effusion and multifocal bone metastasis diagnosed in August 2021.  BIOMARKER(S) % CFDNA OR AMPLIFICATION ASSOCIATED FDA-APPROVED THERAPIES CLINICAL TRIAL AVAILABILITY [id number]* 0.2% None Yes  [id number] 0.2% None Yes  PRIOR THERAPY:  Status post left Pleurx placement for drainage of recurrent pleural effusion under the care of Dr. Roxan Hockey on 08/05/2020.  CURRENT THERAPY:  Systemic chemotherapy with carboplatin for AUC of 5, Alimta 500 mg/M2 and Keytruda 200 mg IV every 3 weeks.  First dose 08/15/2020.  Status post 3 cycles.  INTERVAL HISTORY: Charles Robinson 74 y.o. male returns to the clinic today for follow-up visit.  The patient is feeling fine today except for fatigue, lack of appetite and occasional nausea.  He tolerated the last cycle of his treatment well except for the fatigue.  He denied having any current chest pain but has occasional shortness of breath with exertion and mild cough with no hemoptysis.  He denied having any vomiting, abdominal pain, diarrhea or constipation.  He has no headache or visual changes.  He had the Pleurx catheter removed by Dr. Roxan Hockey.  The patient had repeat CT scan of the chest, abdomen pelvis performed recently and he is here for evaluation and discussion of his scan results.  MEDICAL HISTORY: Past Medical History:  Diagnosis Date  . Arthritis    back  . BPH (benign prostatic hypertrophy)   . CAD S/P multivessel PCI: LAD, RCA and extensive LCX-OM1 03/15/2012   s/p PCI to LAD, to  RCA (now occluded), then extensive PCI to Dominant LCx-OM1  (enitre prox-AVG Circ for dissection in 07/2011)  . Cancer ([facility])    SKIN CA  . Dyspnea   . Dysrhythmia    afib  . GERD (gastroesophageal reflux disease)   . Heart murmur   . History of colon polyps   . History of echocardiogram 08/16/2011   Echo - EF 60-65%; moderate LV concentric hypertrophy; abdnormal LV relaxation (grade 1 diastolic dysfunction; ascending aorta mildly dilated; LA moderately dilated;   . Hyperlipidemia   . Hypertension   . 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 PAF (paroxysmal atrial fibrillation) ([facility]) 12/20/2012   followed by Dr. Glenetta Hew; s/p DCCV 2011, 07/2011 post PCI with Type 4a MI; CHA2DS2Vasc = 3, on Warfarin  . Paroxysmal atrial flutter ([facility]) 08/20/2011   TEE- atrial septum - no defect identified; RA normal in size, no evidence of thrombus; ascending aorta normal  . Prostate cancer ([facility])   . Rupture quadriceps tendon     ALLERGIES:  is allergic to lipitor [atorvastatin], morphine and related, and vicodin [hydrocodone-acetaminophen].  MEDICATIONS:  Current Outpatient Medications  Medication Sig Dispense Refill  . acetaminophen (TYLENOL) 325 MG tablet Take 650 mg by mouth every 6 (six) hours as needed for moderate pain.    [name] amiodarone (PACERONE) 200 MG tablet Take 1 tablet (200 mg total) by mouth daily.    . Coenzyme Q10 200 MG capsule Take 200 mg by mouth every morning.     [name] ELIQUIS 5  MG TABS tablet TAKE 1 TABLET BY MOUTH TWICE A DAY (Patient taking differently: Take 5 mg by mouth 2 (two) times daily. ) 180 tablet 2  . ezetimibe (ZETIA) 10 MG tablet Take 1 tablet (10 mg total) by mouth daily. 90 tablet 2  . folic acid (FOLVITE) 1 MG tablet Take 1 tablet (1 mg total) by mouth daily. 30 tablet 4  . HYDROcodone-homatropine (HYCODAN) 5-1.5 MG/5ML syrup Take 5 mLs by mouth every 6 (six) hours as needed for cough.  120 mL 0  . hydroxypropyl methylcellulose / hypromellose (ISOPTO TEARS / GONIOVISC) 2.5 % ophthalmic solution Place 1 drop into both eyes 3 (three) times daily as needed for dry eyes.    [name] Leuprolide Acetate (ELIGARD ) Inject into the skin.     . mirtazapine (REMERON) 30 MG tablet TAKE 1 TABLET BY MOUTH AT BEDTIME. 90 tablet 1  . Multiple Vitamin (MULTIVITAMIN WITH MINERALS) TABS tablet Take 1 tablet by mouth every morning.     . nitroGLYCERIN (NITROSTAT) 0.4 MG SL tablet PLACE 1 TABLET (0.4 MG TOTAL) UNDER THE TONGUE EVERY 5 (FIVE) MINUTES AS NEEDED. FOR CHEST PAIN 25 tablet 4  . Omega-3 Fatty Acids (FISH OIL) 1000 MG CAPS Take 1 capsule (1,000 mg total) by mouth 2 (two) times daily. 60 capsule 0  . prochlorperazine (COMPAZINE) 10 MG tablet Take 1 tablet (10 mg total) by mouth every 6 (six) hours as needed for nausea or vomiting. 30 tablet 3  . rosuvastatin (CRESTOR) 40 MG tablet TAKE 1 TABLET BY MOUTH EVERY DAY 90 tablet 3  . tamsulosin (FLOMAX) 0.4 MG CAPS capsule Take 0.4 mg by mouth daily.     No current facility-administered medications for this visit.    SURGICAL HISTORY:  Past Surgical History:  Procedure Laterality Date  . BACK SURGERY    . CHEST TUBE INSERTION Left 08/05/2020   Procedure: INSERTION LEFT PLEURAL DRAINAGE CATHETER;  Surgeon: Melrose Nakayama, MD;  Location: [facility];  Service: Thoracic;  Laterality: Left;  . COLONOSCOPY    . CORONARY ANGIOPLASTY WITH STENT PLACEMENT  03/15/2012   multiple stens in AVGroove Circ & OM1;; 2 site PTCA w/ PTCA and stenting of OM2 ang PTCA of distal stent ISR followed by PTCA of mid LCx ISR  . CORONARY STENT PLACEMENT  2003 - 2011 - 2012 - 2013   Pre-2012 - BMS in RCA now known occluded, BMS in proximal LAD; 07/2011 - 2.25 x 23 BMS in OM 1 with significant disease on either side noted shortly after -->  2 additional overlapping DES 2.5 mm x 30 mm and 2.25 mm x 26 mm in OM1,; 3 overlapping Resolute DES in AV groove Cx crossing OM1:  (Tapered from 3.8-2.6 mm)- Resolute DES 3.5 x 22, 3.0 x 38, 2.5 x 14  . DOPPLER ECHOCARDIOGRAPHY  07/2011   EF ~60-65%, Grade 1 D Dysfxn; mod Conc LVH  . ENDARTERECTOMY Left 02/27/2016   Procedure: LEFT  CAROTID ARTERY ENDARTERECTOMY ;  Surgeon: Serafina Mitchell, MD;  Location: [facility];  Service: Vascular;  Laterality: Left;  . LEFT HEART CATHETERIZATION WITH CORONARY ANGIOGRAM N/A 03/15/2012   Procedure: LEFT HEART CATHETERIZATION WITH CORONARY ANGIOGRAM;  Surgeon: [name], MD;  Location: [facility] CATH LAB: patent LAD stents, patent LCx stents w/80% focal ISR just distal to OM; OM proximal stent open; -- 2 site PTCA-PCI  . LUMBAR LAMINECTOMY/DECOMPRESSION MICRODISCECTOMY Bilateral 12/11/2014   Procedure: Bilateral Lumbar Three-Four Laminectomy;  Surgeon: Kristeen Miss, MD;  Location: [facility];  Service: Neurosurgery;  Laterality: Bilateral;  bilateral  . NM MYOVIEW LTD  11/2012   No ischemia or infarction, normal EF & WM  . PATCH ANGIOPLASTY Left 02/27/2016   Procedure: WITH HEMASHIELD DACRON  PATCH ANGIOPLASTY;  Surgeon: Serafina Mitchell, MD;  Location: [facility];  Service: Vascular;  Laterality: Left;  [name] QUADRICEPS TENDON REPAIR  09/19/2012   Procedure: REPAIR QUADRICEP TENDON;  Surgeon: [name], MD;  Location: [facility];  Service: Orthopedics;  Laterality: Left;  [name] VASECTOMY      REVIEW OF SYSTEMS:  Constitutional: positive for anorexia, fatigue and weight loss Eyes: negative Ears, nose, mouth, throat, and face: negative Respiratory: positive for cough and dyspnea on exertion Cardiovascular: negative Gastrointestinal: negative Genitourinary:negative Integument/breast: negative Hematologic/lymphatic: negative Musculoskeletal:positive for muscle weakness Neurological: negative Behavioral/Psych: negative Endocrine: negative Allergic/Immunologic: negative   PHYSICAL EXAMINATION: General appearance: alert, cooperative, fatigued and no distress Head: Normocephalic, without obvious  abnormality, atraumatic Neck: no adenopathy, no JVD, supple, symmetrical, trachea midline and thyroid not enlarged, symmetric, no tenderness/mass/nodules Lymph nodes: Cervical, supraclavicular, and axillary nodes normal. Resp: clear to auscultation bilaterally Back: symmetric, no curvature. ROM normal. No CVA tenderness. Cardio: regular rate and rhythm, S1, S2 normal, no murmur, click, rub or gallop GI: soft, non-tender; bowel sounds normal; no masses,  no organomegaly Extremities: extremities normal, atraumatic, no cyanosis or edema Neurologic: Alert and oriented X 3, normal strength and tone. Normal symmetric reflexes. Normal coordination and gait  ECOG PERFORMANCE STATUS: 1 - Symptomatic but completely ambulatory  Blood pressure 104/68, pulse 86, temperature 97.7 F (36.5 C), temperature source Tympanic, resp. rate 18, height 6\' 3"  (1.905 m), weight 179 lb 11.2 oz (81.5 kg), SpO2 94 %.  LABORATORY DATA: Lab Results  Component Value Date   WBC 4.5 10/24/2020   HGB 8.6 (L) 10/24/2020   HCT 27.3 (L) 10/24/2020   MCV 95.5 10/24/2020   PLT 155 10/24/2020      Chemistry      Component Value Date/Time   NA 142 10/16/2020 0859   NA 140 08/15/2019 0936   K 3.1 (L) 10/16/2020 0859   CL 103 10/16/2020 0859   CO2 29 10/16/2020 0859   BUN 15 10/16/2020 0859   BUN 27 08/15/2019 0936   CREATININE 1.14 10/16/2020 0859   CREATININE 1.43 (H) 07/29/2016 0814      Component Value Date/Time   CALCIUM 9.0 10/16/2020 0859   ALKPHOS 125 10/16/2020 0859   AST 28 10/16/2020 0859   ALT 17 10/16/2020 0859   BILITOT 0.5 10/16/2020 0859       RADIOGRAPHIC STUDIES: DG Chest 2 View  Result Date: 10/08/2020 CLINICAL DATA:  Pleural effusion on the LEFT. History of adenocarcinoma. Pleural drain. EXAM: CHEST - 2 VIEW COMPARISON:  09/20/2020 FINDINGS: Pleural drainage catheter overlies the LEFT lung base and appears unchanged. Heart size is UPPER normal and unchanged. Persistent LEFT pleural  effusion appears similar to the previous chest x-ray. Trace RIGHT pleural effusion. There is no pneumothorax. No consolidation or pulmonary edema. Remote RIGHT rib fracture. IMPRESSION: Bilateral pleural effusions, LEFT greater than RIGHT. No pneumothorax. Electronically Signed   By: Nolon Nations M.D.   On: 10/08/2020 14:01    ASSESSMENT AND PLAN: This is a very pleasant 74 years old white male recently diagnosed with a stage IV (T3, N2, M1 C) non-small cell lung cancer, adenocarcinoma presented with left upper lobe lung mass with occlusion of the left upper lobe bronchus as well as noted complete left lower lobe  atelectasis in addition to mediastinal lymphadenopathy in the AP window as well as malignant left pleural effusion and bone metastasis diagnosed in August 2021. The molecular studies by [facility] showed no actionable mutations.   The collected fluid from the pleural effusion did not have enough cellularity to run the molecular studies by [facility]. The patient is currently on systemic chemotherapy with carboplatin for AUC of 5, Alimta 500 mg/M2 and Keytruda 200 mg IV every 3 weeks on 9/23 2021.  Status post 3 cycles.  He is tolerating this treatment well except for fatigue and lack of appetite. He had repeat CT scan of the chest, abdomen pelvis performed yesterday.  I personally and independently reviewed the scan images and discussed the result and showed the images to the patient and his wife. His scan showed significant improvement in his disease but there was worsening of the bone lesion likely secondary to a healing process from his treatment. I recommended for the patient to continue his current treatment with chemotherapy but I will reduce the dose of carboplatin to AUC of 4 and Alimta 400 mg/M2 in addition to the standard dose of Keytruda 200 mg IV every 3 weeks with Neulasta support. The patient will come back for follow-up visit in 3 weeks for evaluation before starting the  first cycle of his maintenance therapy with Alimta and Keytruda. I will see him back for follow-up visit at that time. For the lack of appetite, he will continue his current treatment with Remeron. For the chemotherapy-induced anemia, I will arrange for the patient to receive 1 unit of PRBCs transfusion this week. The patient was advised to call immediately if he has any concerning symptoms in the interval. The patient voices understanding of current disease status and treatment options and is in agreement with the current care plan. All questions were answered. The patient knows to call the clinic with any problems, questions or concerns. We can certainly see the patient much sooner if necessary.  Disclaimer: This note was dictated with voice recognition software. Similar sounding words can inadvertently be transcribed and may not be corrected upon review.

## 2020-10-26 ENCOUNTER — Inpatient Hospital Stay: Payer: Medicare Other

## 2020-10-26 ENCOUNTER — Other Ambulatory Visit: Payer: Self-pay

## 2020-10-26 VITALS — BP 128/78 | HR 72 | Temp 98.0°F | Resp 17

## 2020-10-26 DIAGNOSIS — C3492 Malignant neoplasm of unspecified part of left bronchus or lung: Secondary | ICD-10-CM

## 2020-10-26 DIAGNOSIS — Z5111 Encounter for antineoplastic chemotherapy: Secondary | ICD-10-CM | POA: Diagnosis not present

## 2020-10-26 LAB — PREPARE RBC (CROSSMATCH)

## 2020-10-26 MED ORDER — PEGFILGRASTIM INJECTION 6 MG/0.6ML ~~LOC~~
PREFILLED_SYRINGE | SUBCUTANEOUS | Status: AC
  Filled 2020-10-26: qty 0.6

## 2020-10-26 MED ORDER — PEGFILGRASTIM INJECTION 6 MG/0.6ML ~~LOC~~
6.0000 mg | PREFILLED_SYRINGE | Freq: Once | SUBCUTANEOUS | Status: AC
  Administered 2020-10-26: 6 mg via SUBCUTANEOUS

## 2020-10-26 MED ORDER — SODIUM CHLORIDE 0.9% IV SOLUTION
250.0000 mL | Freq: Once | INTRAVENOUS | Status: AC
  Administered 2020-10-26: 250 mL via INTRAVENOUS
  Filled 2020-10-26: qty 250

## 2020-10-26 NOTE — Patient Instructions (Signed)

## 2020-10-27 LAB — TYPE AND SCREEN
ABO/RH(D): B POS
Antibody Screen: NEGATIVE
Unit division: 0

## 2020-10-27 LAB — BPAM RBC
Blood Product Expiration Date: 202112312359
ISSUE DATE / TIME: 202112040909
Unit Type and Rh: 7300

## 2020-10-31 ENCOUNTER — Other Ambulatory Visit: Payer: Self-pay

## 2020-10-31 ENCOUNTER — Inpatient Hospital Stay: Payer: Medicare Other

## 2020-10-31 DIAGNOSIS — C3492 Malignant neoplasm of unspecified part of left bronchus or lung: Secondary | ICD-10-CM

## 2020-10-31 DIAGNOSIS — Z5111 Encounter for antineoplastic chemotherapy: Secondary | ICD-10-CM | POA: Diagnosis not present

## 2020-10-31 LAB — CBC WITH DIFFERENTIAL (CANCER CENTER ONLY)
Abs Immature Granulocytes: 0.03 10*3/uL (ref 0.00–0.07)
Basophils Absolute: 0 10*3/uL (ref 0.0–0.1)
Basophils Relative: 1 %
Eosinophils Absolute: 0 10*3/uL (ref 0.0–0.5)
Eosinophils Relative: 0 %
HCT: 28.4 % — ABNORMAL LOW (ref 39.0–52.0)
Hemoglobin: 9.2 g/dL — ABNORMAL LOW (ref 13.0–17.0)
Immature Granulocytes: 1 %
Lymphocytes Relative: 4 %
Lymphs Abs: 0.2 10*3/uL — ABNORMAL LOW (ref 0.7–4.0)
MCH: 30.1 pg (ref 26.0–34.0)
MCHC: 32.4 g/dL (ref 30.0–36.0)
MCV: 92.8 fL (ref 80.0–100.0)
Monocytes Absolute: 0.5 10*3/uL (ref 0.1–1.0)
Monocytes Relative: 9 %
Neutro Abs: 4.9 10*3/uL (ref 1.7–7.7)
Neutrophils Relative %: 85 %
Platelet Count: 63 10*3/uL — ABNORMAL LOW (ref 150–400)
RBC: 3.06 MIL/uL — ABNORMAL LOW (ref 4.22–5.81)
RDW: 19.1 % — ABNORMAL HIGH (ref 11.5–15.5)
WBC Count: 5.7 10*3/uL (ref 4.0–10.5)
nRBC: 0 % (ref 0.0–0.2)

## 2020-10-31 LAB — CMP (CANCER CENTER ONLY)
ALT: 12 U/L (ref 0–44)
AST: 23 U/L (ref 15–41)
Albumin: 2.7 g/dL — ABNORMAL LOW (ref 3.5–5.0)
Alkaline Phosphatase: 144 U/L — ABNORMAL HIGH (ref 38–126)
Anion gap: 10 (ref 5–15)
BUN: 16 mg/dL (ref 8–23)
CO2: 29 mmol/L (ref 22–32)
Calcium: 9 mg/dL (ref 8.9–10.3)
Chloride: 102 mmol/L (ref 98–111)
Creatinine: 1.07 mg/dL (ref 0.61–1.24)
GFR, Estimated: >60 mL/min (ref >60)
Glucose, Bld: 102 mg/dL — ABNORMAL HIGH (ref 70–99)
Potassium: 3.6 mmol/L (ref 3.5–5.1)
Sodium: 141 mmol/L (ref 135–145)
Total Bilirubin: 0.7 mg/dL (ref 0.3–1.2)
Total Protein: 6.4 g/dL — ABNORMAL LOW (ref 6.5–8.1)

## 2020-11-04 ENCOUNTER — Other Ambulatory Visit: Payer: Self-pay | Admitting: Thoracic Surgery (Cardiothoracic Vascular Surgery)

## 2020-11-04 DIAGNOSIS — C3492 Malignant neoplasm of unspecified part of left bronchus or lung: Secondary | ICD-10-CM

## 2020-11-05 ENCOUNTER — Other Ambulatory Visit: Payer: Self-pay

## 2020-11-05 ENCOUNTER — Ambulatory Visit
Admission: RE | Admit: 2020-11-05 | Discharge: 2020-11-05 | Disposition: A | Discharge status: Home / Self Care | Payer: Medicare Other | Source: Ambulatory Visit | Attending: Thoracic Surgery (Cardiothoracic Vascular Surgery) | Admitting: Thoracic Surgery (Cardiothoracic Vascular Surgery)

## 2020-11-05 ENCOUNTER — Ambulatory Visit (INDEPENDENT_AMBULATORY_CARE_PROVIDER_SITE_OTHER): Payer: Medicare Other | Admitting: Thoracic Surgery (Cardiothoracic Vascular Surgery)

## 2020-11-05 ENCOUNTER — Encounter: Payer: Self-pay | Admitting: Thoracic Surgery (Cardiothoracic Vascular Surgery)

## 2020-11-05 VITALS — BP 105/69 | HR 96 | Resp 20 | Ht 75.0 in | Wt 176.0 lb

## 2020-11-05 DIAGNOSIS — I6523 Occlusion and stenosis of bilateral carotid arteries: Secondary | ICD-10-CM

## 2020-11-05 DIAGNOSIS — C3492 Malignant neoplasm of unspecified part of left bronchus or lung: Secondary | ICD-10-CM

## 2020-11-05 DIAGNOSIS — J910 Malignant pleural effusion: Secondary | ICD-10-CM

## 2020-11-05 NOTE — Progress Notes (Signed)
  Radiation Oncology         [phone] ________________________________  Name: Charles Robinson MRN: [id number]  Date: 04/19/2020  DOB: Aug 20, 1946  End of Treatment Note  Diagnosis:   74 y.o. 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     Indication for treatment:  Curative, Definitive Radiotherapy       Radiation treatment dates:   02/26/20-04/19/20  Site/dose:  1. The prostate, seminal vesicles, and pelvic lymph nodes were initially treated to 45 Gy in 25 fractions of 1.8 Gy  2. The prostate only was boosted to 75 Gy with 15 additional fractions of 2.0 Gy   Beams/energy:  1. The prostate, seminal vesicles, and pelvic lymph nodes were initially treated using VMAT intensity modulated radiotherapy delivering 6 megavolt photons. Image guidance was performed with CB-CT studies prior to each fraction. He was immobilized with a body fix lower extremity mold.  2. the prostate only was boosted using VMAT intensity modulated radiotherapy delivering 6 megavolt photons. Image guidance was performed with CB-CT studies prior to each fraction. He was immobilized with a body fix lower extremity mold.  Narrative: The patient tolerated radiation treatment relatively well.   The patient experienced some minor urinary irritation and modest fatigue.    Plan: The patient has completed radiation treatment. He will return to radiation oncology clinic for routine followup in one month. I advised him to call or return sooner if he has any questions or concerns related to his recovery or treatment. ________________________________  Sheral Apley. Tammi Klippel, M.D.

## 2020-11-05 NOTE — Progress Notes (Signed)
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  He had a pleural catheter placed back in September.  He drinks pretty regularly until the end of October when his drainage dropped off dramatically.  I removed the catheter on 10/08/2020.  He now returns for follow-up.  In the interim since his last visit his respiratory status has remained stable.  He does have a persistent cough.  Past Medical History:  Diagnosis Date  . Arthritis    back  . BPH (benign prostatic hypertrophy)   . CAD S/P multivessel PCI: LAD, RCA and extensive LCX-OM1 03/15/2012   s/p PCI to LAD, to RCA (now occluded), then extensive PCI to Dominant LCx-OM1  (enitre prox-AVG Circ for dissection in 07/2011)  . Cancer ([facility])    SKIN CA  . Dyspnea   . Dysrhythmia    afib  . GERD (gastroesophageal reflux disease)   . Heart murmur   . History of colon polyps   . History of echocardiogram 08/16/2011   Echo - EF 60-65%; moderate LV concentric hypertrophy; abdnormal LV relaxation (grade 1 diastolic dysfunction; ascending aorta mildly dilated; LA moderately dilated;   . Hyperlipidemia   . Hypertension   . 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 PAF (paroxysmal atrial fibrillation) ([facility]) 12/20/2012   followed by Dr. Glenetta Hew; s/p DCCV 2011, 07/2011 post PCI with Type 4a MI; CHA2DS2Vasc = 3, on Warfarin  . Paroxysmal atrial flutter ([facility]) 08/20/2011   TEE- atrial septum - no defect identified; RA normal in size, no evidence of thrombus; ascending aorta normal  . Prostate cancer ([facility])   . Rupture quadriceps tendon     Current Outpatient Medications  Medication Sig Dispense Refill  . acetaminophen (TYLENOL) 325 MG tablet Take  650 mg by mouth every 6 (six) hours as needed for moderate pain.    [name] amiodarone (PACERONE) 200 MG tablet Take 1 tablet (200 mg total) by mouth daily. (Patient taking differently: Take 100 mg by mouth daily.)    . Coenzyme Q10 200 MG capsule Take 200 mg by mouth every morning.     [name] ELIQUIS 5 MG TABS tablet TAKE 1 TABLET BY MOUTH TWICE A DAY (Patient taking differently: Take 5 mg by mouth 2 (two) times daily. ) 180 tablet 2  . ezetimibe (ZETIA) 10 MG tablet Take 1 tablet (10 mg total) by mouth daily. 90 tablet 2  . folic acid (FOLVITE) 1 MG tablet Take 1 tablet (1 mg total) by mouth daily. 30 tablet 4  . HYDROcodone-homatropine (HYCODAN) 5-1.5 MG/5ML syrup Take 5 mLs by mouth every 6 (six) hours as needed for cough. 120 mL 0  . hydroxypropyl methylcellulose / hypromellose (ISOPTO TEARS / GONIOVISC) 2.5 % ophthalmic solution Place 1 drop into both eyes 3 (three) times daily as needed for dry eyes.    [name] Leuprolide Acetate (ELIGARD [place]) Inject into the skin.     . mirtazapine (REMERON) 30 MG tablet TAKE 1 TABLET BY MOUTH AT BEDTIME. 90 tablet 1  . Multiple Vitamin (MULTIVITAMIN WITH MINERALS) TABS tablet Take 1 tablet by mouth every morning.     [name]  nitroGLYCERIN (NITROSTAT) 0.4 MG SL tablet PLACE 1 TABLET (0.4 MG TOTAL) UNDER THE TONGUE EVERY 5 (FIVE) MINUTES AS NEEDED. FOR CHEST PAIN 25 tablet 4  . Omega-3 Fatty Acids (FISH OIL) 1000 MG CAPS Take 1 capsule (1,000 mg total) by mouth 2 (two) times daily. 60 capsule 0  . prochlorperazine (COMPAZINE) 10 MG tablet Take 1 tablet (10 mg total) by mouth every 6 (six) hours as needed for nausea or vomiting. 30 tablet 3  . rosuvastatin (CRESTOR) 40 MG tablet TAKE 1 TABLET BY MOUTH EVERY DAY 90 tablet 3  . tamsulosin (FLOMAX) 0.4 MG CAPS capsule Take 0.4 mg by mouth daily.     No current facility-administered medications for this visit.    Physical Exam BP 105/69 (BP Location: Left Arm, Patient Position: Sitting)   Pulse 96   Resp 20   Ht 6\' 3"  (1.905 m)    Wt 176 lb (79.8 kg)   SpO2 97% Comment: RA  BMI 22.72 kg/m  74 year old man in no acute distress Alert and oriented x3 Lungs diminished at left base  Diagnostic Tests: CHEST - 2 VIEW  COMPARISON:  10/23/2020  FINDINGS: Cardiac shadow is stable. Right lung remains clear. Left lung shows persistent basilar effusion tracking along the lateral aspect. Aortic calcifications are seen. No bony abnormality is noted.  IMPRESSION: Persistent left-sided pleural effusion similar to that seen on prior CT.  No acute abnormality is noted.   Electronically Signed   By: Inez Catalina M.D.   On: 11/05/2020 14:04 I personally reviewed the chest x-ray images and concur with the findings noted above. CT chest abdomen pelvis 10/23/2020 IMPRESSION: Chest:  1. Interval decrease in size of LEFT lower lobe central peribronchial mass and LEFT suprahilar mass. Interval re-expansion of the LEFT upper lobe and LEFT lower lobe 2. Marked reduction in the LEFT pleural effusion. Small to moderate volume of fluid remains at the LEFT lung base extending along the horizontal fissure. 3. Interval increase in size of several of the lytic metastasis within the spine. NEW HEALED FRACTURE OF THE POSTERIOR RIGHT FIFTH RIB. I personally reviewed the CT images and concur with the findings noted above  Impression: Charles Robinson is a 74 year old man with stage IV lung cancer with malignant left pleural effusion.  When he initially presented he had essentially complete white out of the left side.  He had a pleural catheter placed back in September.  That stopped draining about the end of October so really has been 2 months since he had any output from that.  We removed that about a month ago.  On his chest x-ray today there is a stable persistent loculated effusion of the left base.  There is no indication for intervention on that.  His respiratory status is improved dramatically during that time.  He also has had  some significant improvement in the size of the mass.  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  Plan: Follow-up with Dr. Julien Nordmann I will be happy to see Charles Robinson back if I can be of any assistance with his care  I spent 12 minutes in review of images, records, and in consultation with Charles Robinson today. Melrose Nakayama, MD [facility] [phone]

## 2020-11-06 ENCOUNTER — Telehealth: Payer: Self-pay | Admitting: Medical Oncology

## 2020-11-06 ENCOUNTER — Ambulatory Visit (INDEPENDENT_AMBULATORY_CARE_PROVIDER_SITE_OTHER): Payer: Medicare Other | Admitting: Cardiology

## 2020-11-06 ENCOUNTER — Telehealth: Payer: Self-pay | Admitting: Internal Medicine

## 2020-11-06 ENCOUNTER — Encounter: Payer: Self-pay | Admitting: Cardiology

## 2020-11-06 ENCOUNTER — Telehealth: Payer: Self-pay

## 2020-11-06 VITALS — BP 105/63 | HR 94 | Ht 75.0 in | Wt 176.0 lb

## 2020-11-06 DIAGNOSIS — I25119 Atherosclerotic heart disease of native coronary artery with unspecified angina pectoris: Secondary | ICD-10-CM | POA: Diagnosis not present

## 2020-11-06 DIAGNOSIS — E785 Hyperlipidemia, unspecified: Secondary | ICD-10-CM

## 2020-11-06 DIAGNOSIS — I959 Hypotension, unspecified: Secondary | ICD-10-CM | POA: Diagnosis not present

## 2020-11-06 DIAGNOSIS — I10 Essential (primary) hypertension: Secondary | ICD-10-CM | POA: Diagnosis not present

## 2020-11-06 DIAGNOSIS — I6523 Occlusion and stenosis of bilateral carotid arteries: Secondary | ICD-10-CM | POA: Diagnosis not present

## 2020-11-06 DIAGNOSIS — Z7902 Long term (current) use of antithrombotics/antiplatelets: Secondary | ICD-10-CM

## 2020-11-06 DIAGNOSIS — I480 Paroxysmal atrial fibrillation: Secondary | ICD-10-CM | POA: Diagnosis not present

## 2020-11-06 DIAGNOSIS — Z79899 Other long term (current) drug therapy: Secondary | ICD-10-CM

## 2020-11-06 NOTE — Telephone Encounter (Signed)
scheduled appt per 12/15 sch msg - unable to reach pt . Left message for patient with appt date and time

## 2020-11-06 NOTE — Telephone Encounter (Signed)
appts-Pt wants to postpone next tx to 12/30, so he will feel good for xmas.   I recommended pt come in as scheduled for 12/16 weekly labs and 12/22-but no infusion. Schedule message sent for  12/30 labs, Charles Robinson and tx

## 2020-11-06 NOTE — Progress Notes (Signed)
Primary Care Provider: [name], MD Cardiologist: Glenetta Hew, MD Electrophysiologist: None  Clinic Note: Chief Complaint  Patient presents with  . Coronary Artery Disease    Multivessel PCI, most notably in the LCx distribution.  No angina  . Atrial Fibrillation    Paroxysmal on low-dose amiodarone, not with bleeding on Eliquis.  . Follow-up    3-month; diagnosed with stage IV lung cancer since last visit.    Problem List Items Addressed This Visit    Essential hypertension (Chronic)   PAF (paroxysmal atrial fibrillation) (HCC) CHA2DS2-VASc score-6 (age & CVA -2each, HTN, MI/CAD) (Chronic)   Coronary artery disease involving native coronary artery of native heart with angina pectoris (HCC) (Chronic)   On amiodarone therapy (Chronic)   Antiplatelet or antithrombotic long-term use (Chronic)   Hypotension (Chronic)      HPI:    Charles Nicks "Sonny" is a 74 y.o. male with a PMH notable for CAD (multivessel PCI of LCx system), PAF (on Amiodarone & Eliquis), h/o CVA - L Carotid Occlusion (after CEA in 2019) who presents today for 4 month f/u, .  Recent Significant Diagnoses  Prostate cancer (November 2020)-(Seed implantation), XRT (completed May 2028) and hormonal therapy  Positive Cologuard study-pending colonoscopy (Aug 2021)  Stage IV non-small cell lung cancer (August 6073) -> complicated by pleural effusion and bone metastasis  Charles Nicks was last seen on July 08, 2020 for 3-month follow-up.  As of March, he was walking about 2 miles a day without any sensation of palpitations or irregular heartbeats.  No chest pain or pressure. - ->  He had a ER visit with near syncope.  Was found to be in A. fib RVR.  Dehydrated and hypotensive.  Was started on low-dose beta-blocker.  No cardioversion because of missed dose of Eliquis. -> Noted profound weakness and fatigue.  Completed radiation therapy office doing hormonal therapy.  Other than being worn out, no  sensations of irregular heartbeats except for the ER visit.  No chest tightness or pressure.  No exertional dyspnea.  He did note cough (-converted from ACE inhibitor to ARB).   With concern of possible pulmonary toxicity, decided to reduce amiodarone to 1/2 tab daily and stop Plavix because of concerns of bleeding/fusion.  Also he had a positive Cologuard test.  Chest x-ray diabetes August 8th ER encounter showed small left pleural effusion with patchy left hilar lung opacity and volume loss in the left hemithorax. ->  Referred for CT scan by PCP:  Large dependent left pleural effusion with masslike focus of consolidation in posterior central left upper lobe (6.3 x 3.5 cm) associated with left upper lobe bronchus and near complete left lower lobe atelectasis.  Few tiny right upper lobe nodules.  Also noted rib lesions concerning for osseous metastasis with pathologic right seventh rib fracture as well as lytic lesions in thoracic vertebral bodies.   PET scan ordered July 16, 2020 showed hypermetabolic activity in the left hilum involving central portion atelectatic left upper lobe and left lower lobe.  Ill-defined hypermetabolic tissue-findings consistent with bronchogenic carcinoma with postobstructive collapse of the left upper lobe and left lower lobe with large left pleural effusion occupying 70% of the hemithorax.  Multifocal hypermetabolic lytic skeletal metastasis (not at baseline prostate cancer [place])  Recent Hospitalizations:   08/05/2020: Insertion of left pleural drainage catheter (removed on November 6)   Reviewed  CV studies:    The following studies were reviewed today: (if available, images/films reviewed: From Epic Chart  or Care Everywhere) . Echo 01/2020: EF 55-60%.  Normal wall motion.  Moderate concentric LVH.  GRII DD with severe LA dilation..    Mild to moderate Calcific Aortic Stenosis.Mildly elevated PAP and borderline elevated RAP. [name] Carotid Dopplers 07/15/2020: <40% TICA.  LICA TO.  Normal bilateral vertebral arteries and subclavian arteries.  Interval History:   Charles Robinson is here today for follow-up.  We discussed his very unfortunate new diagnosis of lung cancer.  He is very weak, fatigued, and probably not at this point depressed as well.  He says he is eating a little better, but chemotherapy does take his appetite and makes him quite nauseated.  He is very deconditioned and weak.  Lost a lot of weight.  He does get short of breath with minimal activity, cough is getting better.  Shortness of breath that improved with the drainage of his pleural effusion.  Thankfully, he has not noted any recurrent episodes of A. fib that have been tachycardic, he has not had PRN aggressive with exercise noted exertional chest discomfort or dyspnea.  He has had issues with hypotension and therefore is off of any blood pressure medications.  CV Review of Symptoms (Summary): positive for - dyspnea on exertion, shortness of breath and Cough, lightheadedness and weakness worse with change in position.  He has rib pain but no anginal pain negative for - edema, irregular heartbeat, orthopnea, palpitations, paroxysmal nocturnal dyspnea, shortness of breath or Syncope or near syncope, TIA/amaurosis fugax or claudication.  The patient does not have symptoms concerning for COVID-19 infection (fever, chills, cough, or new shortness of breath).   REVIEWED OF SYSTEMS   Review of Systems  Constitutional: Positive for chills, malaise/fatigue and weight loss. Negative for diaphoresis (Pale, night sweats) and fever.  HENT: Positive for congestion. Negative for nosebleeds.   Respiratory: Positive for cough (Minimal) and shortness of breath.   Cardiovascular: Negative for leg swelling.  Gastrointestinal: Negative for blood in stool and melena.  Genitourinary: Negative for hematuria.  Musculoskeletal: Positive for joint pain. Negative for falls.  Neurological: Positive for dizziness, focal  weakness and weakness.  Psychiatric/Behavioral: Positive for memory loss. Negative for hallucinations and substance abuse. Depression: At least dysthymic -> 2 cancers in 1 year. The patient is nervous/anxious and has insomnia.    I have reviewed and (if needed) personally updated the patient's problem list, medications, allergies, past medical and surgical history, social and family history.   PAST MEDICAL HISTORY   Past Medical History:  Diagnosis Date  . Arthritis    back  . BPH (benign prostatic hypertrophy)   . CAD S/P multivessel PCI: LAD, RCA and extensive LCX-OM1 2004   2004: s/p PCI to LAD, to RCA (now occluded),; 02/2011 - OM1 PCI -> 07/2011 then extensive PCI to Dominant LCx-OM1  (enitre prox-AVG Circ for dissection in 07/2011)  . Calcific aortic stenosis 01/2020   Mild to moderate  . Dyspnea   . Dysrhythmia    afib  . GERD (gastroesophageal reflux disease)   . History of colon polyps   . History of echocardiogram 08/16/2011   Echo - EF 60-65%; moderate LV concentric hypertrophy; abdnormal LV relaxation (grade 1 diastolic dysfunction; ascending aorta mildly dilated; LA moderately dilated;   . Hyperlipidemia   . Hypertension   . 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 Lung cancer, primary, with metastasis from lung to other site, left ([place]) 2020-2021   LEFT: Stage  IV (T3, N2, M1c) non-small cell lung cancer, adenocarcinoma; malignant pleural effusion and bone metastasis  . PAF (paroxysmal atrial fibrillation) ([place]) 12/20/2012   followed by Dr. Glenetta Hew; s/p DCCV 2011, 07/2011 post PCI with Type 4a MI; CHA2DS2Vasc = 3, on Warfarin  . Paroxysmal atrial flutter ([place]) 08/20/2011   TEE- atrial septum - no defect identified; RA normal in size, no evidence of thrombus; ascending aorta normal  . Prostate cancer ([place]) 09/2019   Radioactive Seed Implantation; XRT (completed 03/2020), hormome Rx.  .  Rupture quadriceps tendon   . Skin cancer    SKIN CA   Longstanding CAD/CV History:  2004 (Dr. Albertine Patricia) - PCI LAD & RCA  03/2011: routine Myoview -- Inferior Ischemia -->   Cath-PCI (Dr. Rex Kras) : small RCA 100% CTO @ stent (L-R collaterals). LAD- prox stent patent, D1 ost 50%. OM1 subtotal TO just proximal to bifurcation--> bifurcation PCI/PTCA - 2.25 x 23 (2.5 mm) MinVision BMS into inferior branch.  (07/2011) Recurrent Angina - Abnormal Nuc ST--  PCI of OM1 ISR /thrombosis -> complicated by Guideliner related dissection involving a large portion of the Cx &OM1 -- essentially "full metal jacket" in Cx from prox to distal bifurcation &down the OM1 up to the previous stent.  ? Hospitalization complicated by Type 4a MI & Afib RVR -- DCCV.  03/15/2012: Abnormal Nuc ST again for CP -->   PTCA of OM1 ostium & overlapping DES in OM (uncovered site) with BMS ISR PTCA & mCX PTCA.  Follow-up Myoview January 2014: No ischemia or infarction.  S/p L CEA - 2019 --> now with CTO of LICA on Carotid Dopplers 2021  Mild-Mod AS on Echo 01/2020   PAST SURGICAL HISTORY   Past Surgical History:  Procedure Laterality Date  . BACK SURGERY    . CHEST TUBE INSERTION Left 08/05/2020   Procedure: INSERTION LEFT PLEURAL DRAINAGE CATHETER;  Surgeon: Melrose Nakayama, MD;  Location: [facility];  Service: Thoracic;  Laterality: Left;  . COLONOSCOPY    . CORONARY ANGIOPLASTY WITH STENT PLACEMENT  03/15/2012   multiple stens in AVGroove Circ & OM1;; 2 site PTCA w/ PTCA and stenting of OM2 and  PTCA of distal stent ISR followed by PTCA of mid LCx ISR  . CORONARY STENT PLACEMENT  2003 - 2011 - 2012 - 2013   Pre-2012 - BMS in RCA now known occluded, BMS in proximal LAD; 07/2011 - 2.25 x 23 BMS in OM 1 with significant disease on either side noted shortly after -->  2 additional overlapping DES 2.5 mm x 30 mm and 2.25 mm x 26 mm in OM1,; 3 overlapping Resolute DES in AV groove Cx crossing OM1: (Tapered from 3.8-2.6  mm)- Resolute DES 3.5 x 22, 3.0 x 38, 2.5 x 14  . ENDARTERECTOMY Left 02/27/2016   Procedure: LEFT  CAROTID ARTERY ENDARTERECTOMY ;  Surgeon: Serafina Mitchell, MD;  Location: [facility];  Service: Vascular;  Laterality: Left;  . LEFT HEART CATHETERIZATION WITH CORONARY ANGIOGRAM N/A 03/15/2012   Procedure: LEFT HEART CATHETERIZATION WITH CORONARY ANGIOGRAM;  Surgeon: [name], MD;  Location: [facility] CATH LAB: patent LAD stents, patent LCx stents w/80% focal ISR just distal to OM; OM proximal stent open; -- 2 site PTCA-PCI  . LUMBAR LAMINECTOMY/DECOMPRESSION MICRODISCECTOMY Bilateral 12/11/2014   Procedure: Bilateral Lumbar Three-Four Laminectomy;  Surgeon: Kristeen Miss, MD;  Location: [facility];  Service: Neurosurgery;  Laterality: Bilateral;  bilateral  . NM MYOVIEW LTD  11/2012   No ischemia or  infarction, normal EF & WM  . PATCH ANGIOPLASTY Left 02/27/2016   Procedure: WITH HEMASHIELD DACRON  PATCH ANGIOPLASTY;  Surgeon: Serafina Mitchell, MD;  Location: [facility];  Service: Vascular;  Laterality: Left;  [name] QUADRICEPS TENDON REPAIR  09/19/2012   Procedure: REPAIR QUADRICEP TENDON;  Surgeon: [name], MD;  Location: [facility];  Service: Orthopedics;  Laterality: Left;  . TRANSTHORACIC ECHOCARDIOGRAM  01/2020   EF 55-60%.  Normal wall motion.  Moderate concentric LVH.  GRII DD with severe LA dilation..    Mild to moderate Calcific Aortic Stenosis.Mildly elevated PAP and borderline elevated RAP.  [name] VASECTOMY      Immunization History  Administered Date(s) Administered  . Influenza Whole 11/23/2005, 10/24/2007, 08/23/2009  . Influenza, High Dose Seasonal PF 08/20/2017, 08/29/2018  . PFIZER SARS-COV-2 Vaccination 12/07/2019, 12/26/2019  . Pneumococcal Polysaccharide-23 11/28/2009  . Td 11/23/1996, 10/23/2006  . Tdap 06/30/2020  . Zoster 11/27/2008   C MEDICATIONS/ALLERGIES   Current Meds  Medication Sig  . acetaminophen (TYLENOL) 325 MG tablet Take 650 mg by mouth every 6 (six) hours as needed for  moderate pain.  [name] amiodarone (PACERONE) 200 MG tablet Take 0.5 tablets (100 mg total) by mouth daily.  [name] apixaban (ELIQUIS) 5 MG TABS tablet Take 1 tablet (5 mg total) by mouth 2 (two) times daily.  . Coenzyme Q10 200 MG capsule Take 200 mg by mouth every morning.   . ezetimibe (ZETIA) 10 MG tablet Take 1 tablet (10 mg total) by mouth daily.  . folic acid (FOLVITE) 1 MG tablet Take 1 tablet (1 mg total) by mouth daily.  [name] HYDROcodone-homatropine (HYCODAN) 5-1.5 MG/5ML syrup Take 5 mLs by mouth every 6 (six) hours as needed for cough.  . hydroxypropyl methylcellulose / hypromellose (ISOPTO TEARS / GONIOVISC) 2.5 % ophthalmic solution Place 1 drop into both eyes 3 (three) times daily as needed for dry eyes.  [name] Leuprolide Acetate (ELIGARD [place]) Inject into the skin.   . mirtazapine (REMERON) 30 MG tablet TAKE 1 TABLET BY MOUTH AT BEDTIME.  . Multiple Vitamin (MULTIVITAMIN WITH MINERALS) TABS tablet Take 1 tablet by mouth every morning.   . nitroGLYCERIN (NITROSTAT) 0.4 MG SL tablet PLACE 1 TABLET (0.4 MG TOTAL) UNDER THE TONGUE EVERY 5 (FIVE) MINUTES AS NEEDED. FOR CHEST PAIN  . Omega-3 Fatty Acids (FISH OIL) 1000 MG CAPS Take 1 capsule (1,000 mg total) by mouth 2 (two) times daily.  . prochlorperazine (COMPAZINE) 10 MG tablet Take 1 tablet (10 mg total) by mouth every 6 (six) hours as needed for nausea or vomiting.  . rosuvastatin (CRESTOR) 40 MG tablet Take 1 tablet (40 mg total) by mouth daily.  . tamsulosin (FLOMAX) 0.4 MG CAPS capsule Take 0.4 mg by mouth daily.  . [DISCONTINUED] amiodarone (PACERONE) 200 MG tablet Take 1 tablet (200 mg total) by mouth daily. (Patient taking differently: Take 100 mg by mouth daily.)  . [DISCONTINUED] ELIQUIS 5 MG TABS tablet TAKE 1 TABLET BY MOUTH TWICE A DAY (Patient taking differently: Take 5 mg by mouth 2 (two) times daily. )  . [DISCONTINUED] ezetimibe (ZETIA) 10 MG tablet Take 1 tablet (10 mg total) by mouth daily.  . [DISCONTINUED] nitroGLYCERIN (NITROSTAT)  0.4 MG SL tablet PLACE 1 TABLET (0.4 MG TOTAL) UNDER THE TONGUE EVERY 5 (FIVE) MINUTES AS NEEDED. FOR CHEST PAIN  . [DISCONTINUED] rosuvastatin (CRESTOR) 40 MG tablet TAKE 1 TABLET BY MOUTH EVERY DAY    Allergies  Allergen Reactions  . Lipitor [Atorvastatin] Other (See Comments)  Increased HR is currently tolerating --   . Morphine And Related Nausea And Vomiting  . Vicodin [Hydrocodone-Acetaminophen] Other (See Comments)    Feel crazy    SOCIAL HISTORY/FAMILY HISTORY   Reviewed in Epic:  Pertinent findings:  Social History   Tobacco Use  . Smoking status: Never Smoker  . Smokeless tobacco: Never Used  Vaping Use  . Vaping Use: Never used  Substance Use Topics  . Alcohol use: Yes    Alcohol/week: 2.0 standard drinks    Types: 2 Cans of beer per week    Comment: rare  . Drug use: No   Social History   Social History Narrative   [name] is a father of 2, grandfather of 12.  His daughter Barbaraann Share was classmates with Dr. Ellyn Hack in college.   He and his wife are currently in the process of moving houses, further down the street in order to downsize to a single-story house.  He has been working out at [employer].       OBJCTIVE -PE, EKG, labs   Wt Readings from Last 3 Encounters:  11/21/20 172 lb 11.2 oz (78.3 kg)  11/06/20 176 lb (79.8 kg)  11/05/20 176 lb (79.8 kg)  07/08/20: 214 lb 9.6 oz.  Physical Exam: BP 105/63   Pulse 94   Ht 6\' 3"  (1.905 m)   Wt 176 lb (79.8 kg)   SpO2 99%   BMI 22.00 kg/m  Physical Exam Vitals reviewed.  Constitutional:      General: He is in acute distress.     Appearance: He is ill-appearing (chronicallly ill appearing - thin/ frail; notable wgt loss). He is not toxic-appearing or diaphoretic.  HENT:     Head: Normocephalic and atraumatic.     Comments: Mild temporal wasting Neck:     Vascular: No carotid bruit (radiated AS murmur).  Cardiovascular:     Rate and Rhythm: Normal rate and regular rhythm. Occasional extrasystoles are  present.    Chest Wall: PMI is not displaced.     Pulses: Intact distal pulses.     Heart sounds: S1 normal and S2 normal. Heart sounds are distant. Murmur (1-2/6 C-D SEM @ RUSB -> neck) heard.  No gallop.   Pulmonary:     Effort: No accessory muscle usage or prolonged expiration.     Breath sounds: Examination of the left-upper field reveals decreased breath sounds and rhonchi. Examination of the left-middle field reveals decreased breath sounds and rhonchi. Examination of the left-lower field reveals decreased breath sounds, rhonchi and rales. Decreased breath sounds, rhonchi and rales present.  Chest:     Chest wall: Tenderness (R lateral) present.  Musculoskeletal:        General: No swelling.     Cervical back: Normal range of motion and neck supple.  Skin:    General: Skin is warm and dry.     Coloration: Skin is pale.  Neurological:     General: No focal deficit present.     Mental Status: He is alert and oriented to person, place, and time.  Psychiatric:        Mood and Affect: Mood normal.        Behavior: Behavior normal.        Thought Content: Thought content normal.        Judgment: Judgment normal.     Adult ECG Report Not checked  Recent Labs:    05/21/20: TC 142, HDL 40, LDL 79, TG 115. Lab Results  Component  Value Date   CHOL 132 08/15/2019   HDL 46 08/15/2019   LDLCALC 71 08/15/2019   TRIG 78 08/15/2019   CHOLHDL 2.9 08/15/2019   Lab Results  Component Value Date   CREATININE 1.22 11/21/2020   BUN 23 11/21/2020   NA 140 11/21/2020   K 3.4 (L) 11/21/2020   CL 101 11/21/2020   CO2 31 11/21/2020   Lab Results  Component Value Date   TSH 1.554 11/07/2020    ASSESSMENT/PLAN    Problem List Items Addressed This Visit    Dyslipidemia, goal LDL below 70 (Chronic)    Last lipids showed LDL 79.  At this point I think continuing current dose of rosuvastatin and Zetia is reasonable.      Relevant Medications   rosuvastatin (CRESTOR) 40 MG tablet    nitroGLYCERIN (NITROSTAT) 0.4 MG SL tablet   apixaban (ELIQUIS) 5 MG TABS tablet   amiodarone (PACERONE) 200 MG tablet   ezetimibe (ZETIA) 10 MG tablet   Essential hypertension (Chronic)    No longer having issues with hypertension.  57 hypotension.  He is off all antihypertensive agents.      Relevant Medications   rosuvastatin (CRESTOR) 40 MG tablet   nitroGLYCERIN (NITROSTAT) 0.4 MG SL tablet   apixaban (ELIQUIS) 5 MG TABS tablet   amiodarone (PACERONE) 200 MG tablet   ezetimibe (ZETIA) 10 MG tablet   PAF (paroxysmal atrial fibrillation) (HCC) CHA2DS2-VASc score-6 (age & CVA -2each, HTN, MI/CAD) (Chronic)    We previously plan to stop amiodarone, but have breakthrough not that long ago.  Plan: Continue monitoring and daily amiodarone as well as Eliquis.  Beta-blocker stopped last visit, concern for hypotension.      Relevant Medications   rosuvastatin (CRESTOR) 40 MG tablet   nitroGLYCERIN (NITROSTAT) 0.4 MG SL tablet   apixaban (ELIQUIS) 5 MG TABS tablet   amiodarone (PACERONE) 200 MG tablet   ezetimibe (ZETIA) 10 MG tablet   Coronary artery disease involving native coronary artery of native heart with angina pectoris (HCC) - Primary (Chronic)    Extensive PCI to LCx and OM 1 with no current anginal symptoms.  Last evaluation showed no evidence of ischemia but infarct noted. Recently stopped Plavix because of Eliquis. Not on beta-blocker or other antihypertensives because of hypotension  Plan: Continue with low-dose amiodarone which does give some beta-blocker effect  Continue rosuvastatin plus ezetimibe.  Monitor for symptoms, but for now we will hold off on any further ischemic evaluation      Relevant Medications   rosuvastatin (CRESTOR) 40 MG tablet   nitroGLYCERIN (NITROSTAT) 0.4 MG SL tablet   apixaban (ELIQUIS) 5 MG TABS tablet   amiodarone (PACERONE) 200 MG tablet   ezetimibe (ZETIA) 10 MG tablet   On amiodarone therapy (Chronic)    He has lung cancer,  unlikely to have pulmonary toxicity, however need to follow-up.  Unfortunately, PFTs were not helpful, nor would markers of inflammation      Antiplatelet or antithrombotic long-term use (Chronic)    Now with combination of A. fib and stroke-CHA2DS2-VASc score of 6, he also has double was not triple malignancy all of which significantly increased is risk of thrombosis. He remains on Eliquis, we have stopped Plavix to avoid potential bleeding      Hypotension (Chronic)    Blood pressure seems to be stabilized off all medications.  Low threshold to consider midodrine. Ensure adequate hydration and nutrition      Relevant Medications   rosuvastatin (CRESTOR) 40 MG  tablet   nitroGLYCERIN (NITROSTAT) 0.4 MG SL tablet   apixaban (ELIQUIS) 5 MG TABS tablet   amiodarone (PACERONE) 200 MG tablet   ezetimibe (ZETIA) 10 MG tablet       COVID-19 Education: The signs and symptoms of COVID-19 were discussed with the patient and how to seek care for testing (follow up with PCP or arrange E-visit).   The importance of social distancing and COVID-19 vaccination was discussed today. The patient is practicing social distancing & Masking.   I spent a total of 32minutes with the patient spent in direct patient consultation.  Additional time spent with chart review  / charting (studies, outside notes, etc): 18 -> reviewed recent Dx of Lung Ca.  Total Time: 43 min   Current medicines are reviewed at length with the patient today.  (+/- concerns) n/a  This visit occurred during the SARS-CoV-2 public health emergency.  Safety protocols were in place, including screening questions prior to the visit, additional usage of staff PPE, and extensive cleaning of exam room while observing appropriate contact time as indicated for disinfecting solutions.  Notice: This dictation was prepared with Dragon dictation along with smaller phrase technology. Any transcriptional errors that result from this process are  unintentional and may not be corrected upon review.  Patient Instructions / Medication Changes & Studies & Tests Ordered   Patient Instructions  Medication Instructions:  Continue same medications *If you need a refill on your cardiac medications before your next appointment, please call your pharmacy*   Lab Work: None ordered   Testing/Procedures: None ordered   Follow-Up: At [facility], you and your health needs are our priority.  As part of our continuing mission to provide you with exceptional heart care, we have created designated Provider Care Teams.  These Care Teams include your primary Cardiologist (physician) and Advanced Practice Providers (APPs -  Physician Assistants and Nurse Practitioners) who all work together to provide you with the care you need, when you need it.  We recommend signing up for the patient portal called "MyChart".  Sign up information is provided on this After Visit Summary.  MyChart is used to connect with patients for Virtual Visits (Telemedicine).  Patients are able to view lab/test results, encounter notes, upcoming appointments, etc.  Non-urgent messages can be sent to your provider as well.   To learn more about what you can do with MyChart, go to [email or web address].    Your next appointment:  6 months   The format for your next appointment: Office   Provider:  Dr.Ashia Dehner      Studies Ordered:   No orders of the defined types were placed in this encounter.    Glenetta Hew, M.D., M.S. Interventional Cardiologist   Pager # [phone] Phone # [phone] [address] [place], [place] [address]   Thank you for choosing Heartcare at [facility]!!

## 2020-11-06 NOTE — Telephone Encounter (Signed)
(  4:24p) SW left a message requesting a call back to check patient status and schedule palliative care visit.

## 2020-11-06 NOTE — Patient Instructions (Signed)
Medication Instructions:  Continue same medications *If you need a refill on your cardiac medications before your next appointment, please call your pharmacy*   Lab Work: None ordered   Testing/Procedures: None ordered   Follow-Up: At [facility], you and your health needs are our priority.  As part of our continuing mission to provide you with exceptional heart care, we have created designated Provider Care Teams.  These Care Teams include your primary Cardiologist (physician) and Advanced Practice Providers (APPs -  Physician Assistants and Nurse Practitioners) who all work together to provide you with the care you need, when you need it.  We recommend signing up for the patient portal called "MyChart".  Sign up information is provided on this After Visit Summary.  MyChart is used to connect with patients for Virtual Visits (Telemedicine).  Patients are able to view lab/test results, encounter notes, upcoming appointments, etc.  Non-urgent messages can be sent to your provider as well.   To learn more about what you can do with MyChart, go to [email or web address].    Your next appointment:  6 months   The format for your next appointment: Office   Provider:  Dr.Harding

## 2020-11-07 ENCOUNTER — Other Ambulatory Visit: Payer: Medicare Other

## 2020-11-07 ENCOUNTER — Other Ambulatory Visit: Payer: Self-pay | Admitting: Medical Oncology

## 2020-11-07 ENCOUNTER — Other Ambulatory Visit: Payer: Self-pay

## 2020-11-07 ENCOUNTER — Telehealth: Payer: Self-pay | Admitting: Medical Oncology

## 2020-11-07 ENCOUNTER — Inpatient Hospital Stay: Payer: Medicare Other

## 2020-11-07 DIAGNOSIS — Z5112 Encounter for antineoplastic immunotherapy: Secondary | ICD-10-CM

## 2020-11-07 DIAGNOSIS — Z5111 Encounter for antineoplastic chemotherapy: Secondary | ICD-10-CM

## 2020-11-07 DIAGNOSIS — C3492 Malignant neoplasm of unspecified part of left bronchus or lung: Secondary | ICD-10-CM

## 2020-11-07 DIAGNOSIS — R5382 Chronic fatigue, unspecified: Secondary | ICD-10-CM

## 2020-11-07 LAB — CMP (CANCER CENTER ONLY)
ALT: 14 U/L (ref 0–44)
AST: 22 U/L (ref 15–41)
Albumin: 2.7 g/dL — ABNORMAL LOW (ref 3.5–5.0)
Alkaline Phosphatase: 130 U/L — ABNORMAL HIGH (ref 38–126)
Anion gap: 10 (ref 5–15)
BUN: 19 mg/dL (ref 8–23)
CO2: 30 mmol/L (ref 22–32)
Calcium: 9.2 mg/dL (ref 8.9–10.3)
Chloride: 102 mmol/L (ref 98–111)
Creatinine: 1.25 mg/dL — ABNORMAL HIGH (ref 0.61–1.24)
GFR, Estimated: 60 mL/min — ABNORMAL LOW (ref >60)
Glucose, Bld: 151 mg/dL — ABNORMAL HIGH (ref 70–99)
Potassium: 3.9 mmol/L (ref 3.5–5.1)
Sodium: 142 mmol/L (ref 135–145)
Total Bilirubin: 0.6 mg/dL (ref 0.3–1.2)
Total Protein: 6.4 g/dL — ABNORMAL LOW (ref 6.5–8.1)

## 2020-11-07 LAB — CBC WITH DIFFERENTIAL (CANCER CENTER ONLY)
Abs Immature Granulocytes: 0.02 10*3/uL (ref 0.00–0.07)
Basophils Absolute: 0 10*3/uL (ref 0.0–0.1)
Basophils Relative: 0 %
Eosinophils Absolute: 0 10*3/uL (ref 0.0–0.5)
Eosinophils Relative: 0 %
HCT: 24.8 % — ABNORMAL LOW (ref 39.0–52.0)
Hemoglobin: 8.1 g/dL — ABNORMAL LOW (ref 13.0–17.0)
Immature Granulocytes: 1 %
Lymphocytes Relative: 6 %
Lymphs Abs: 0.2 10*3/uL — ABNORMAL LOW (ref 0.7–4.0)
MCH: 30.8 pg (ref 26.0–34.0)
MCHC: 32.7 g/dL (ref 30.0–36.0)
MCV: 94.3 fL (ref 80.0–100.0)
Monocytes Absolute: 0.4 10*3/uL (ref 0.1–1.0)
Monocytes Relative: 11 %
Neutro Abs: 3.4 10*3/uL (ref 1.7–7.7)
Neutrophils Relative %: 82 %
Platelet Count: 58 10*3/uL — ABNORMAL LOW (ref 150–400)
RBC: 2.63 MIL/uL — ABNORMAL LOW (ref 4.22–5.81)
RDW: 18.9 % — ABNORMAL HIGH (ref 11.5–15.5)
WBC Count: 4.1 10*3/uL (ref 4.0–10.5)
nRBC: 0 % (ref 0.0–0.2)

## 2020-11-07 LAB — TSH: TSH: 1.554 u[IU]/mL (ref 0.320–4.118)

## 2020-11-07 NOTE — Telephone Encounter (Signed)
wife notified. Schedule message sent and ordered entered.

## 2020-11-07 NOTE — Telephone Encounter (Signed)
-----   Message from Curt Bears, MD sent at 11/07/2020 12:53 PM EST ----- He will need 2 units of PRBCs transfusion either tomorrow or Saturday.  Thank you. ----- Message ----- From: Buel Ream, Lab In [place] Sent: 11/07/2020   9:45 AM EST To: Curt Bears, MD

## 2020-11-08 ENCOUNTER — Other Ambulatory Visit: Payer: Self-pay

## 2020-11-08 ENCOUNTER — Inpatient Hospital Stay: Payer: Medicare Other

## 2020-11-08 DIAGNOSIS — Z5111 Encounter for antineoplastic chemotherapy: Secondary | ICD-10-CM

## 2020-11-08 LAB — PREPARE RBC (CROSSMATCH)

## 2020-11-08 MED ORDER — SODIUM CHLORIDE 0.9% IV SOLUTION
250.0000 mL | Freq: Once | INTRAVENOUS | Status: AC
  Administered 2020-11-08: 12:00:00 250 mL via INTRAVENOUS
  Filled 2020-11-08: qty 250

## 2020-11-08 MED ORDER — DIPHENHYDRAMINE HCL 25 MG PO CAPS
25.0000 mg | ORAL_CAPSULE | Freq: Once | ORAL | Status: AC
  Administered 2020-11-08: 12:00:00 25 mg via ORAL

## 2020-11-08 MED ORDER — ACETAMINOPHEN 325 MG PO TABS
650.0000 mg | ORAL_TABLET | Freq: Once | ORAL | Status: AC
  Administered 2020-11-08: 12:00:00 650 mg via ORAL

## 2020-11-08 MED ORDER — ACETAMINOPHEN 325 MG PO TABS
ORAL_TABLET | ORAL | Status: AC
  Filled 2020-11-08: qty 2

## 2020-11-08 MED ORDER — DIPHENHYDRAMINE HCL 25 MG PO CAPS
ORAL_CAPSULE | ORAL | Status: AC
  Filled 2020-11-08: qty 1

## 2020-11-08 NOTE — Patient Instructions (Signed)

## 2020-11-08 NOTE — Progress Notes (Signed)
Patient discharged in stable condition with no complaints 

## 2020-11-09 LAB — TYPE AND SCREEN
ABO/RH(D): B POS
Antibody Screen: NEGATIVE
Unit division: 0
Unit division: 0

## 2020-11-09 LAB — BPAM RBC
Blood Product Expiration Date: 202201152359
Blood Product Expiration Date: 202201152359
ISSUE DATE / TIME: 202112171200
ISSUE DATE / TIME: 202112171200
Unit Type and Rh: 7300
Unit Type and Rh: 7300

## 2020-11-13 ENCOUNTER — Ambulatory Visit: Payer: Medicare Other | Admitting: Physician Assistant

## 2020-11-13 ENCOUNTER — Other Ambulatory Visit: Payer: Self-pay

## 2020-11-13 ENCOUNTER — Inpatient Hospital Stay: Payer: Medicare Other

## 2020-11-13 ENCOUNTER — Ambulatory Visit: Payer: Medicare Other

## 2020-11-13 DIAGNOSIS — Z5111 Encounter for antineoplastic chemotherapy: Secondary | ICD-10-CM | POA: Diagnosis not present

## 2020-11-13 DIAGNOSIS — Z5112 Encounter for antineoplastic immunotherapy: Secondary | ICD-10-CM

## 2020-11-13 DIAGNOSIS — C3492 Malignant neoplasm of unspecified part of left bronchus or lung: Secondary | ICD-10-CM

## 2020-11-13 LAB — CMP (CANCER CENTER ONLY)
ALT: 14 U/L (ref 0–44)
AST: 26 U/L (ref 15–41)
Albumin: 3 g/dL — ABNORMAL LOW (ref 3.5–5.0)
Alkaline Phosphatase: 145 U/L — ABNORMAL HIGH (ref 38–126)
Anion gap: 12 (ref 5–15)
BUN: 16 mg/dL (ref 8–23)
CO2: 29 mmol/L (ref 22–32)
Calcium: 9.7 mg/dL (ref 8.9–10.3)
Chloride: 102 mmol/L (ref 98–111)
Creatinine: 1.34 mg/dL — ABNORMAL HIGH (ref 0.61–1.24)
GFR, Estimated: 55 mL/min — ABNORMAL LOW (ref >60)
Glucose, Bld: 141 mg/dL — ABNORMAL HIGH (ref 70–99)
Potassium: 3.3 mmol/L — ABNORMAL LOW (ref 3.5–5.1)
Sodium: 143 mmol/L (ref 135–145)
Total Bilirubin: 0.7 mg/dL (ref 0.3–1.2)
Total Protein: 7.1 g/dL (ref 6.5–8.1)

## 2020-11-13 LAB — CBC WITH DIFFERENTIAL (CANCER CENTER ONLY)
Abs Immature Granulocytes: 0.04 10*3/uL (ref 0.00–0.07)
Basophils Absolute: 0 10*3/uL (ref 0.0–0.1)
Basophils Relative: 0 %
Eosinophils Absolute: 0 10*3/uL (ref 0.0–0.5)
Eosinophils Relative: 0 %
HCT: 35.3 % — ABNORMAL LOW (ref 39.0–52.0)
Hemoglobin: 11.6 g/dL — ABNORMAL LOW (ref 13.0–17.0)
Immature Granulocytes: 1 %
Lymphocytes Relative: 6 %
Lymphs Abs: 0.4 10*3/uL — ABNORMAL LOW (ref 0.7–4.0)
MCH: 30.9 pg (ref 26.0–34.0)
MCHC: 32.9 g/dL (ref 30.0–36.0)
MCV: 93.9 fL (ref 80.0–100.0)
Monocytes Absolute: 0.6 10*3/uL (ref 0.1–1.0)
Monocytes Relative: 9 %
Neutro Abs: 5.4 10*3/uL (ref 1.7–7.7)
Neutrophils Relative %: 84 %
Platelet Count: 149 10*3/uL — ABNORMAL LOW (ref 150–400)
RBC: 3.76 MIL/uL — ABNORMAL LOW (ref 4.22–5.81)
RDW: 17.4 % — ABNORMAL HIGH (ref 11.5–15.5)
WBC Count: 6.4 10*3/uL (ref 4.0–10.5)
nRBC: 0 % (ref 0.0–0.2)

## 2020-11-14 ENCOUNTER — Other Ambulatory Visit: Payer: Medicare Other

## 2020-11-18 ENCOUNTER — Other Ambulatory Visit: Payer: Medicare Other

## 2020-11-18 ENCOUNTER — Other Ambulatory Visit: Payer: Self-pay

## 2020-11-18 DIAGNOSIS — Z515 Encounter for palliative care: Secondary | ICD-10-CM

## 2020-11-20 NOTE — Progress Notes (Signed)
COMMUNITY PALLIATIVE CARE SW NOTE  PATIENT NAME: Charles Robinson DOB: [birth year] MRN: [id number]  PRIMARY CARE PROVIDER: [name], MD  RESPONSIBLE PARTY:  Acct ID - Guarantor Home Phone Work Phone Relationship Acct Type  [id number] Charles Robinson, [id number] [phone]  Self P/F     [address] [place], [place], [place] [address]   Due to the COVID-19, this visit was done via telephone from my office and it was initiated and consent by this PCG.    PLAN OF CARE and INTERVENTIONS:             1. GOALS OF CARE/ ADVANCE CARE PLANNING:  Goals to be further explorel 2. SOCIAL/EMOTIONAL/SPIRITUAL ASSESSMENT/ INTERVENTIONS:  SW completed a telephonic visit with patient's wife-[name]. [name] thanked SW for following up. She expressed her frustration with the home health agency who will be discharging patient this week. [name] explained that although patient was doing better, she still needed help and had become dependent on the regular visits from the home health nurse. She stated that patient continues to receive chemotherapy, but it is light chemotherapy. Although she feels his overall condition has improved, patient continues to have low energy, his appetite has improved where he is eating 50% of 2-3 meals a day. He ambulates with a walker and has not had any falls. His blood pressures have remained stable. SW assessed PCG coping. PCG advised that she is coping well since patient's status has improved. [name] expressed appreciation for the call and is open to ongoing SW/palliative care support.  3. PATIENT/CAREGIVER EDUCATION/ COPING:  PCG report that she and patient are coping well. They have good support through family and friends. 4. PERSONAL EMERGENCY PLAN:  911 can be accessed for emergencies.  5. COMMUNITY RESOURCES COORDINATION/ HEALTH CARE NAVIGATION:  Patient is scheduled to be discharged from home health services this week. SW reinforced access to palliative care support.  6. FINANCIAL/LEGAL  CONCERNS/INTERVENTIONS:  None.     SOCIAL HX:  Social History   Tobacco Use   Smoking status: Never Smoker   Smokeless tobacco: Never Used  Substance Use Topics   Alcohol use: Yes    Alcohol/week: 2.0 standard drinks    Types: 2 Cans of beer per week    Comment: rare    CODE STATUS: To be assessed ADVANCED DIRECTIVES: To be assessed MOST FORM COMPLETE:  To be assessed HOSPICE EDUCATION PROVIDED: None.  PPS: Patient is alert and oriented x3. His condition is stable as reported by PCG.  Duration of telephonic visit and documentation: 30 minutes.      [address] [place], [place]

## 2020-11-21 ENCOUNTER — Other Ambulatory Visit: Payer: Medicare Other

## 2020-11-21 ENCOUNTER — Inpatient Hospital Stay: Payer: Medicare Other

## 2020-11-21 ENCOUNTER — Other Ambulatory Visit: Payer: Self-pay

## 2020-11-21 ENCOUNTER — Telehealth: Payer: Self-pay | Admitting: Internal Medicine

## 2020-11-21 ENCOUNTER — Inpatient Hospital Stay (HOSPITAL_BASED_OUTPATIENT_CLINIC_OR_DEPARTMENT_OTHER): Payer: Medicare Other | Admitting: Internal Medicine

## 2020-11-21 ENCOUNTER — Ambulatory Visit: Payer: Medicare Other | Admitting: Internal Medicine

## 2020-11-21 ENCOUNTER — Ambulatory Visit: Payer: Medicare Other

## 2020-11-21 VITALS — BP 104/64 | HR 88 | Temp 97.7°F | Resp 15 | Ht 75.0 in | Wt 172.7 lb

## 2020-11-21 DIAGNOSIS — C3492 Malignant neoplasm of unspecified part of left bronchus or lung: Secondary | ICD-10-CM

## 2020-11-21 DIAGNOSIS — C7951 Secondary malignant neoplasm of bone: Secondary | ICD-10-CM

## 2020-11-21 DIAGNOSIS — I10 Essential (primary) hypertension: Secondary | ICD-10-CM | POA: Diagnosis not present

## 2020-11-21 DIAGNOSIS — I6523 Occlusion and stenosis of bilateral carotid arteries: Secondary | ICD-10-CM | POA: Diagnosis not present

## 2020-11-21 DIAGNOSIS — Z5112 Encounter for antineoplastic immunotherapy: Secondary | ICD-10-CM

## 2020-11-21 DIAGNOSIS — Z5111 Encounter for antineoplastic chemotherapy: Secondary | ICD-10-CM

## 2020-11-21 LAB — CBC WITH DIFFERENTIAL (CANCER CENTER ONLY)
Abs Immature Granulocytes: 0.03 10*3/uL (ref 0.00–0.07)
Basophils Absolute: 0 10*3/uL (ref 0.0–0.1)
Basophils Relative: 0 %
Eosinophils Absolute: 0 10*3/uL (ref 0.0–0.5)
Eosinophils Relative: 0 %
HCT: 32.6 % — ABNORMAL LOW (ref 39.0–52.0)
Hemoglobin: 10.7 g/dL — ABNORMAL LOW (ref 13.0–17.0)
Immature Granulocytes: 1 %
Lymphocytes Relative: 4 %
Lymphs Abs: 0.2 10*3/uL — ABNORMAL LOW (ref 0.7–4.0)
MCH: 30.5 pg (ref 26.0–34.0)
MCHC: 32.8 g/dL (ref 30.0–36.0)
MCV: 92.9 fL (ref 80.0–100.0)
Monocytes Absolute: 0.7 10*3/uL (ref 0.1–1.0)
Monocytes Relative: 13 %
Neutro Abs: 4.4 10*3/uL (ref 1.7–7.7)
Neutrophils Relative %: 82 %
Platelet Count: 153 10*3/uL (ref 150–400)
RBC: 3.51 MIL/uL — ABNORMAL LOW (ref 4.22–5.81)
RDW: 17 % — ABNORMAL HIGH (ref 11.5–15.5)
WBC Count: 5.4 10*3/uL (ref 4.0–10.5)
nRBC: 0 % (ref 0.0–0.2)

## 2020-11-21 LAB — CMP (CANCER CENTER ONLY)
ALT: 12 U/L (ref 0–44)
AST: 22 U/L (ref 15–41)
Albumin: 2.7 g/dL — ABNORMAL LOW (ref 3.5–5.0)
Alkaline Phosphatase: 131 U/L — ABNORMAL HIGH (ref 38–126)
Anion gap: 8 (ref 5–15)
BUN: 23 mg/dL (ref 8–23)
CO2: 31 mmol/L (ref 22–32)
Calcium: 9.3 mg/dL (ref 8.9–10.3)
Chloride: 101 mmol/L (ref 98–111)
Creatinine: 1.22 mg/dL (ref 0.61–1.24)
GFR, Estimated: >60 mL/min (ref >60)
Glucose, Bld: 147 mg/dL — ABNORMAL HIGH (ref 70–99)
Potassium: 3.4 mmol/L — ABNORMAL LOW (ref 3.5–5.1)
Sodium: 140 mmol/L (ref 135–145)
Total Bilirubin: 0.7 mg/dL (ref 0.3–1.2)
Total Protein: 6.7 g/dL (ref 6.5–8.1)

## 2020-11-21 MED ORDER — PEMETREXED DISODIUM CHEMO INJECTION 500 MG
400.0000 mg/m2 | Freq: Once | INTRAVENOUS | Status: AC
  Administered 2020-11-21: 15:00:00 900 mg via INTRAVENOUS
  Filled 2020-11-21: qty 20

## 2020-11-21 MED ORDER — PROCHLORPERAZINE MALEATE 10 MG PO TABS
10.0000 mg | ORAL_TABLET | Freq: Once | ORAL | Status: AC
  Administered 2020-11-21: 13:00:00 10 mg via ORAL

## 2020-11-21 MED ORDER — PROCHLORPERAZINE MALEATE 10 MG PO TABS
ORAL_TABLET | ORAL | Status: AC
  Filled 2020-11-21: qty 1

## 2020-11-21 MED ORDER — PEGFILGRASTIM 6 MG/0.6ML ~~LOC~~ PSKT: 6.0000 mg | PREFILLED_SYRINGE | Freq: Once | SUBCUTANEOUS | Status: DC

## 2020-11-21 MED ORDER — PEGFILGRASTIM 6 MG/0.6ML ~~LOC~~ PSKT
PREFILLED_SYRINGE | SUBCUTANEOUS | Status: AC
  Filled 2020-11-21: qty 0.6

## 2020-11-21 MED ORDER — SODIUM CHLORIDE 0.9 % IV SOLN
200.0000 mg | Freq: Once | INTRAVENOUS | Status: AC
  Administered 2020-11-21: 14:00:00 200 mg via INTRAVENOUS
  Filled 2020-11-21: qty 8

## 2020-11-21 MED ORDER — SODIUM CHLORIDE 0.9 % IV SOLN
Freq: Once | INTRAVENOUS | Status: AC
  Filled 2020-11-21: qty 250

## 2020-11-21 NOTE — Patient Instructions (Signed)
[place] Discharge Instructions for Patients Receiving Chemotherapy  Today you received the following chemotherapy agents Pembrolizumab (KEYTRUDA) & Pemetrexed (ALIMTA).  To help prevent nausea and vomiting after your treatment, we encourage you to take your nausea medication as prescribed.   If you develop nausea and vomiting that is not controlled by your nausea medication, call the clinic.   BELOW ARE SYMPTOMS THAT SHOULD BE REPORTED IMMEDIATELY:  *FEVER GREATER THAN 100.5 F  *CHILLS WITH OR WITHOUT FEVER  NAUSEA AND VOMITING THAT IS NOT CONTROLLED WITH YOUR NAUSEA MEDICATION  *UNUSUAL SHORTNESS OF BREATH  *UNUSUAL BRUISING OR BLEEDING  TENDERNESS IN MOUTH AND THROAT WITH OR WITHOUT PRESENCE OF ULCERS  *URINARY PROBLEMS  *BOWEL PROBLEMS  UNUSUAL RASH Items with * indicate a potential emergency and should be followed up as soon as possible.  Feel free to call the clinic should you have any questions or concerns. The clinic phone number is [phone].  Please show the [place] at check-in to the Emergency Department and triage nurse.

## 2020-11-21 NOTE — Progress Notes (Signed)
[place] [phone]   [phone]  OFFICE PROGRESS NOTE  [name], MD [place] [address]  DIAGNOSIS: Stage IV (T3, N2, M1c) non-small cell lung cancer, adenocarcinoma presented with left upper lobe lung mass with occlusion of the left upper lobe bronchus and near complete left lower lobe atelectasis in addition to mediastinal lymphadenopathy in the AP window and suspicious malignant left pleural effusion and multifocal bone metastasis diagnosed in August 2021.  BIOMARKER(S) % CFDNA OR AMPLIFICATION ASSOCIATED FDA-APPROVED THERAPIES CLINICAL TRIAL AVAILABILITY [id number]* 0.2% None Yes  [id number] 0.2% None Yes  PRIOR THERAPY:  Status post left Pleurx placement for drainage of recurrent pleural effusion under the care of Dr. Roxan Hockey on 08/05/2020.  CURRENT THERAPY:  Systemic chemotherapy with carboplatin for AUC of 5, Alimta 500 mg/M2 and Keytruda 200 mg IV every 3 weeks.  First dose 08/15/2020.  Status post 4 cycles.  Starting from cycle #5 he will be on maintenance treatment with Alimta and Keytruda every 3 weeks.  INTERVAL HISTORY: Charles Robinson 74 y.o. male returns to the clinic today for follow-up visit accompanied by his wife.  The patient is feeling fine today with no concerning complaints except for the baseline fatigue and generalized weakness.  He also lost 4 pounds since his last visit secondary to lack of appetite.  He is currently on Remeron for depression and to stimulate his appetite.  He denied having any current chest pain but has shortness of breath with exertion with no cough or hemoptysis.  He denied having any fever or chills.  He has no nausea, vomiting, diarrhea or constipation.  He is here today for evaluation before starting cycle #5 of his treatment.  MEDICAL HISTORY: Past Medical History:  Diagnosis Date   Arthritis    back   BPH (benign prostatic hypertrophy)    CAD S/P multivessel PCI:  LAD, RCA and extensive LCX-OM1 03/15/2012   s/p PCI to LAD, to RCA (now occluded), then extensive PCI to Dominant LCx-OM1  (enitre prox-AVG Circ for dissection in 07/2011)   Cancer ([place])    SKIN CA   Dyspnea    Dysrhythmia    afib   GERD (gastroesophageal reflux disease)    Heart murmur    History of colon polyps    History of echocardiogram 08/16/2011   Echo - EF 60-65%; moderate LV concentric hypertrophy; abdnormal LV relaxation (grade 1 diastolic dysfunction; ascending aorta mildly dilated; LA moderately dilated;    Hyperlipidemia    Hypertension    Left-sided extracranial carotid artery occlusion 08/01/2009   History of CVA, as of February 2021-Dopplers indicate CTO   PAF (paroxysmal atrial fibrillation) ([place]) 12/20/2012   followed by Dr. Glenetta Hew; s/p DCCV 2011, 07/2011 post PCI with Type 4a MI; CHA2DS2Vasc = 3, on Warfarin   Paroxysmal atrial flutter ([place]) 08/20/2011   TEE- atrial septum - no defect identified; RA normal in size, no evidence of thrombus; ascending aorta normal   Prostate cancer ([place])    Rupture quadriceps tendon     ALLERGIES:  is allergic to lipitor [atorvastatin], morphine and related, and vicodin [hydrocodone-acetaminophen].  MEDICATIONS:  Current Outpatient Medications  Medication Sig Dispense Refill   acetaminophen (TYLENOL) 325 MG tablet Take 650 mg by mouth every 6 (six) hours as needed for moderate pain.     amiodarone (PACERONE) 200 MG tablet Take 1 tablet (200 mg total) by mouth daily. (Patient taking differently: Take 100 mg by mouth daily.)  Coenzyme Q10 200 MG capsule Take 200 mg by mouth every morning.      ELIQUIS 5 MG TABS tablet TAKE 1 TABLET BY MOUTH TWICE A DAY (Patient taking differently: Take 5 mg by mouth 2 (two) times daily. ) 180 tablet 2   ezetimibe (ZETIA) 10 MG tablet Take 1 tablet (10 mg total) by mouth daily. 90 tablet 2   folic acid (FOLVITE) 1 MG tablet Take 1 tablet (1 mg total) by mouth daily. 30  tablet 4   HYDROcodone-homatropine (HYCODAN) 5-1.5 MG/5ML syrup Take 5 mLs by mouth every 6 (six) hours as needed for cough. 120 mL 0   hydroxypropyl methylcellulose / hypromellose (ISOPTO TEARS / GONIOVISC) 2.5 % ophthalmic solution Place 1 drop into both eyes 3 (three) times daily as needed for dry eyes.     Leuprolide Acetate (ELIGARD Shorter) Inject into the skin.      mirtazapine (REMERON) 30 MG tablet TAKE 1 TABLET BY MOUTH AT BEDTIME. 90 tablet 1   Multiple Vitamin (MULTIVITAMIN WITH MINERALS) TABS tablet Take 1 tablet by mouth every morning.      nitroGLYCERIN (NITROSTAT) 0.4 MG SL tablet PLACE 1 TABLET (0.4 MG TOTAL) UNDER THE TONGUE EVERY 5 (FIVE) MINUTES AS NEEDED. FOR CHEST PAIN 25 tablet 4   Omega-3 Fatty Acids (FISH OIL) 1000 MG CAPS Take 1 capsule (1,000 mg total) by mouth 2 (two) times daily. 60 capsule 0   prochlorperazine (COMPAZINE) 10 MG tablet Take 1 tablet (10 mg total) by mouth every 6 (six) hours as needed for nausea or vomiting. 30 tablet 3   rosuvastatin (CRESTOR) 40 MG tablet TAKE 1 TABLET BY MOUTH EVERY DAY 90 tablet 3   tamsulosin (FLOMAX) 0.4 MG CAPS capsule Take 0.4 mg by mouth daily.     No current facility-administered medications for this visit.    SURGICAL HISTORY:  Past Surgical History:  Procedure Laterality Date   BACK SURGERY     CHEST TUBE INSERTION Left 08/05/2020   Procedure: INSERTION LEFT PLEURAL DRAINAGE CATHETER;  Surgeon: Melrose Nakayama, MD;  Location: [facility];  Service: Thoracic;  Laterality: Left;   COLONOSCOPY     CORONARY ANGIOPLASTY WITH STENT PLACEMENT  03/15/2012   multiple stens in [place];; 2 site PTCA w/ PTCA and stenting of OM2 ang PTCA of distal stent ISR followed by PTCA of mid LCx ISR   CORONARY STENT PLACEMENT  2003 - 2011 - 2012 - 2013   Pre-2012 - BMS in RCA now known occluded, BMS in proximal LAD; 07/2011 - 2.25 x 23 BMS in OM 1 with significant disease on either side noted shortly after -->  2  additional overlapping DES 2.5 mm x 30 mm and 2.25 mm x 26 mm in OM1,; 3 overlapping Resolute DES in AV groove Cx crossing OM1: (Tapered from 3.8-2.6 mm)- Resolute DES 3.5 x 22, 3.0 x 38, 2.5 x 14   DOPPLER ECHOCARDIOGRAPHY  07/2011   EF ~60-65%, Grade 1 D Dysfxn; mod Conc LVH   ENDARTERECTOMY Left 02/27/2016   Procedure: LEFT  CAROTID ARTERY ENDARTERECTOMY ;  Surgeon: Serafina Mitchell, MD;  Location: [facility];  Service: Vascular;  Laterality: Left;   LEFT HEART CATHETERIZATION WITH CORONARY ANGIOGRAM N/A 03/15/2012   Procedure: LEFT HEART CATHETERIZATION WITH CORONARY ANGIOGRAM;  Surgeon: [name], MD;  Location: [facility] CATH LAB: patent LAD stents, patent LCx stents w/80% focal ISR just distal to OM; OM proximal stent open; -- 2 site PTCA-PCI   LUMBAR LAMINECTOMY/DECOMPRESSION  MICRODISCECTOMY Bilateral 12/11/2014   Procedure: Bilateral Lumbar Three-Four Laminectomy;  Surgeon: Kristeen Miss, MD;  Location: [facility];  Service: Neurosurgery;  Laterality: Bilateral;  bilateral   NM MYOVIEW LTD  11/2012   No ischemia or infarction, normal EF & WM   PATCH ANGIOPLASTY Left 02/27/2016   Procedure: WITH HEMASHIELD DACRON  PATCH ANGIOPLASTY;  Surgeon: Serafina Mitchell, MD;  Location: [facility];  Service: Vascular;  Laterality: Left;   [place]  09/19/2012   Procedure: REPAIR QUADRICEP TENDON;  Surgeon: [name], MD;  Location: [facility];  Service: Orthopedics;  Laterality: Left;   VASECTOMY      REVIEW OF SYSTEMS:  A comprehensive review of systems was negative except for: Constitutional: positive for anorexia, fatigue and weight loss Respiratory: positive for dyspnea on exertion Musculoskeletal: positive for muscle weakness   PHYSICAL EXAMINATION: General appearance: alert, cooperative, fatigued and no distress Head: Normocephalic, without obvious abnormality, atraumatic Neck: no adenopathy, no JVD, supple, symmetrical, trachea midline and thyroid not enlarged, symmetric, no  tenderness/mass/nodules Lymph nodes: Cervical, supraclavicular, and axillary nodes normal. Resp: clear to auscultation bilaterally Back: symmetric, no curvature. ROM normal. No CVA tenderness. Cardio: regular rate and rhythm, S1, S2 normal, no murmur, click, rub or gallop GI: soft, non-tender; bowel sounds normal; no masses,  no organomegaly Extremities: extremities normal, atraumatic, no cyanosis or edema  ECOG PERFORMANCE STATUS: 1 - Symptomatic but completely ambulatory  Blood pressure 104/64, pulse 88, temperature 97.7 F (36.5 C), temperature source Tympanic, resp. rate 15, height 6\' 3"  (1.905 m), weight 172 lb 11.2 oz (78.3 kg), SpO2 100 %.  LABORATORY DATA: Lab Results  Component Value Date   WBC 5.4 11/21/2020   HGB 10.7 (L) 11/21/2020   HCT 32.6 (L) 11/21/2020   MCV 92.9 11/21/2020   PLT 153 11/21/2020      Chemistry      Component Value Date/Time   NA 140 11/21/2020 0959   NA 140 08/15/2019 0936   K 3.4 (L) 11/21/2020 0959   CL 101 11/21/2020 0959   CO2 31 11/21/2020 0959   BUN 23 11/21/2020 0959   BUN 27 08/15/2019 0936   CREATININE 1.22 11/21/2020 0959   CREATININE 1.43 (H) 07/29/2016 0814      Component Value Date/Time   CALCIUM 9.3 11/21/2020 0959   ALKPHOS 131 (H) 11/21/2020 0959   AST 22 11/21/2020 0959   ALT 12 11/21/2020 0959   BILITOT 0.7 11/21/2020 0959       RADIOGRAPHIC STUDIES: DG Chest 2 View  Result Date: 11/05/2020 CLINICAL DATA:  History of lung carcinoma with left-sided pleural effusion EXAM: CHEST - 2 VIEW COMPARISON:  10/23/2020 FINDINGS: Cardiac shadow is stable. Right lung remains clear. Left lung shows persistent basilar effusion tracking along the lateral aspect. Aortic calcifications are seen. No bony abnormality is noted. IMPRESSION: Persistent left-sided pleural effusion similar to that seen on prior CT. No acute abnormality is noted. Electronically Signed   By: Inez Catalina M.D.   On: 11/05/2020 14:04   CT Chest W  Contrast  Result Date: 10/24/2020 CLINICAL DATA:  74 year old male with stage IV lung cancer. LEFT perihilar mass with bone metastasis. Ongoing chemotherapy and immunotherapy. EXAM: CT CHEST, ABDOMEN, AND PELVIS WITH CONTRAST TECHNIQUE: Multidetector CT imaging of the chest, abdomen and pelvis was performed following the standard protocol during bolus administration of intravenous contrast. CONTRAST:  153mL OMNIPAQUE IOHEXOL 300 MG/ML  SOLN COMPARISON:  PET-CT 07/16/2020 FINDINGS: CT CHEST FINDINGS Cardiovascular: Coronary artery calcification and  aortic atherosclerotic calcification. No pericardial effusion. Mediastinum/Nodes: No axillary or supraclavicular adenopathy. No mediastinal adenopathy. Indistinctness of the mediastinal fat around the LEFT mainstem bronchus without mass lesion. Small hiatal hernia. Lungs/Pleura: Marked reduction in the LEFT pleural effusion. Small to moderate volume of fluid remains at the LEFT lung base extending along the horizontal fissure. Interval re-expansion of the LEFT lower lobe. Mass lesion previously at the surrounding the LEFT lower lobe bronchus appears decreased in size and now lays against the mediastinal border measuring 3.9 x 2.1 cm decreased from 5.8 x 2.7 cm. (Image 26/2). Interval expansion of the LEFT upper lobe. LEFT suprahilar mass surrounding the LEFT upper lobe bronchus is decreased in volume measuring 2.4 x 2.4 cm (image 22/2) decreased from approximately 4.5 x 5.2 cm on prior. Persistent atelectasis in the medial aspect of the LEFT upper lobe along the anterior margin of pleural surface and mediastinum (image 35/6). A trace RIGHT pleural effusion. No suspicious nodularity in RIGHT lung. Musculoskeletal: Multiple lytic skeletal metastasis again noted. In comparison to CT scan 07/11/2020 PET-CT scan 07/16/2020, several lesions several lesions are increased in size. For example the sternal lesion measuring 30 mm (image 93/5) compares with 28 mm. Lesion in the  superior endplate of T7 vertebral body is increased in size measuring 12 mm compared to 5 mm (image 90/5). Lucent vertebral body lesion at T6 with a rim of sclerosis measuring 11 mm (image 90/5/sagittal) is increased/new from prior. Lesion at T12 measuring 14 mm (image 87/5) increased from 9 mm. Lytic lesion in the T11 vertebral body measuring 18 mm (image 92/5) is more conspicuous than prior. New fracture of the posterior RIGHT fifth rib with mild healing (image 18/2). CT ABDOMEN AND PELVIS FINDINGS Hepatobiliary: Low-density lesion liver more conspicuous than on comparison exam. Presumably due to IV contrast. Example 12 mm lesion the anterior aspect of the central LEFT hepatic lobe on image 50/2. Smaller lesions too small to characterize. Small gallstones present. Pancreas: Pancreas is normal. No ductal dilatation. No pancreatic inflammation. Spleen: Normal spleen Adrenals/urinary tract: Adrenal glands normal. Bilateral nonenhancing renal cysts. Ureters and bladder normal. Curvilinear calcification at the posterior LEFT bladder not changed. Calcification in the wall the bladder is new from prior (image 111/2) appears to be at the RIGHT vesicoureteral junction. No hydroureter on the RIGHT. Stomach/Bowel: Stomach, small bowel, appendix, and cecum are normal. The colon and rectosigmoid colon are normal. Vascular/Lymphatic: Abdominal aorta is normal caliber with atherosclerotic calcification. There is no retroperitoneal or periportal lymphadenopathy. No pelvic lymphadenopathy. Reproductive: Prostate unremarkable.  Fiduciary markers noted Other: No peritoneal metastasis. Musculoskeletal: Multiple metastatic lesions noted. 13 mm lesion in the L5 vertebral body (image 85/2) is increased from 9 mm. No distinct new lesions are identified. IMPRESSION: Chest: 1. Interval decrease in size of LEFT lower lobe central peribronchial mass and LEFT suprahilar mass. Interval re-expansion of the LEFT upper lobe and LEFT lower lobe 2.  Marked reduction in the LEFT pleural effusion. Small to moderate volume of fluid remains at the LEFT lung base extending along the horizontal fissure. 3. Interval increase in size of several of the lytic metastasis within the spine. NEW HEALED FRACTURE OF THE POSTERIOR RIGHT FIFTH RIB. Abdomen: 1. No evidence of metastatic disease in the soft tissues of the abdomen pelvis. 2. Low-density lesions in the liver more conspicuous on current contrast exam and favored benign cysts. 3. Interval increase in size of lytic lesions within the lumbar spine and pelvis. No new lesions identified. 4. New calcification at the RIGHT  vesicoureteral junction is concerning for distal ureteral stone. No hydronephrosis or hydroureter to suggest RIGHT renal obstruction. 5. Cholelithiasis. 6. Aortic Atherosclerosis (ICD10-I70.0). Findings conveyed toDr.  Neale Burly 10/24/2020  at08:54. Electronically Signed   By: Suzy Bouchard M.D.   On: 10/24/2020 08:54   CT Abdomen Pelvis W Contrast  Result Date: 10/24/2020 CLINICAL DATA:  74 year old male with stage IV lung cancer. LEFT perihilar mass with bone metastasis. Ongoing chemotherapy and immunotherapy. EXAM: CT CHEST, ABDOMEN, AND PELVIS WITH CONTRAST TECHNIQUE: Multidetector CT imaging of the chest, abdomen and pelvis was performed following the standard protocol during bolus administration of intravenous contrast. CONTRAST:  16mL OMNIPAQUE IOHEXOL 300 MG/ML  SOLN COMPARISON:  PET-CT 07/16/2020 FINDINGS: CT CHEST FINDINGS Cardiovascular: Coronary artery calcification and aortic atherosclerotic calcification. No pericardial effusion. Mediastinum/Nodes: No axillary or supraclavicular adenopathy. No mediastinal adenopathy. Indistinctness of the mediastinal fat around the LEFT mainstem bronchus without mass lesion. Small hiatal hernia. Lungs/Pleura: Marked reduction in the LEFT pleural effusion. Small to moderate volume of fluid remains at the LEFT lung base extending along the horizontal  fissure. Interval re-expansion of the LEFT lower lobe. Mass lesion previously at the surrounding the LEFT lower lobe bronchus appears decreased in size and now lays against the mediastinal border measuring 3.9 x 2.1 cm decreased from 5.8 x 2.7 cm. (Image 26/2). Interval expansion of the LEFT upper lobe. LEFT suprahilar mass surrounding the LEFT upper lobe bronchus is decreased in volume measuring 2.4 x 2.4 cm (image 22/2) decreased from approximately 4.5 x 5.2 cm on prior. Persistent atelectasis in the medial aspect of the LEFT upper lobe along the anterior margin of pleural surface and mediastinum (image 35/6). A trace RIGHT pleural effusion. No suspicious nodularity in RIGHT lung. Musculoskeletal: Multiple lytic skeletal metastasis again noted. In comparison to CT scan 07/11/2020 PET-CT scan 07/16/2020, several lesions several lesions are increased in size. For example the sternal lesion measuring 30 mm (image 93/5) compares with 28 mm. Lesion in the superior endplate of T7 vertebral body is increased in size measuring 12 mm compared to 5 mm (image 90/5). Lucent vertebral body lesion at T6 with a rim of sclerosis measuring 11 mm (image 90/5/sagittal) is increased/new from prior. Lesion at T12 measuring 14 mm (image 87/5) increased from 9 mm. Lytic lesion in the T11 vertebral body measuring 18 mm (image 92/5) is more conspicuous than prior. New fracture of the posterior RIGHT fifth rib with mild healing (image 18/2). CT ABDOMEN AND PELVIS FINDINGS Hepatobiliary: Low-density lesion liver more conspicuous than on comparison exam. Presumably due to IV contrast. Example 12 mm lesion the anterior aspect of the central LEFT hepatic lobe on image 50/2. Smaller lesions too small to characterize. Small gallstones present. Pancreas: Pancreas is normal. No ductal dilatation. No pancreatic inflammation. Spleen: Normal spleen Adrenals/urinary tract: Adrenal glands normal. Bilateral nonenhancing renal cysts. Ureters and bladder  normal. Curvilinear calcification at the posterior LEFT bladder not changed. Calcification in the wall the bladder is new from prior (image 111/2) appears to be at the RIGHT vesicoureteral junction. No hydroureter on the RIGHT. Stomach/Bowel: Stomach, small bowel, appendix, and cecum are normal. The colon and rectosigmoid colon are normal. Vascular/Lymphatic: Abdominal aorta is normal caliber with atherosclerotic calcification. There is no retroperitoneal or periportal lymphadenopathy. No pelvic lymphadenopathy. Reproductive: Prostate unremarkable.  Fiduciary markers noted Other: No peritoneal metastasis. Musculoskeletal: Multiple metastatic lesions noted. 13 mm lesion in the L5 vertebral body (image 85/2) is increased from 9 mm. No distinct new lesions are identified. IMPRESSION: Chest: 1. Interval  decrease in size of LEFT lower lobe central peribronchial mass and LEFT suprahilar mass. Interval re-expansion of the LEFT upper lobe and LEFT lower lobe 2. Marked reduction in the LEFT pleural effusion. Small to moderate volume of fluid remains at the LEFT lung base extending along the horizontal fissure. 3. Interval increase in size of several of the lytic metastasis within the spine. NEW HEALED FRACTURE OF THE POSTERIOR RIGHT FIFTH RIB. Abdomen: 1. No evidence of metastatic disease in the soft tissues of the abdomen pelvis. 2. Low-density lesions in the liver more conspicuous on current contrast exam and favored benign cysts. 3. Interval increase in size of lytic lesions within the lumbar spine and pelvis. No new lesions identified. 4. New calcification at the RIGHT vesicoureteral junction is concerning for distal ureteral stone. No hydronephrosis or hydroureter to suggest RIGHT renal obstruction. 5. Cholelithiasis. 6. Aortic Atherosclerosis (ICD10-I70.0). Findings conveyed toDr.  Neale Burly 10/24/2020  at08:54. Electronically Signed   By: Suzy Bouchard M.D.   On: 10/24/2020 08:54    ASSESSMENT AND PLAN: This is a  very pleasant 74 years old white male recently diagnosed with a stage IV (T3, N2, M1 C) non-small cell lung cancer, adenocarcinoma presented with left upper lobe lung mass with occlusion of the left upper lobe bronchus as well as noted complete left lower lobe atelectasis in addition to mediastinal lymphadenopathy in the AP window as well as malignant left pleural effusion and bone metastasis diagnosed in August 2021. The molecular studies by [employer] showed no actionable mutations.   The collected fluid from the pleural effusion did not have enough cellularity to run the molecular studies by [facility]. The patient is currently on systemic chemotherapy with carboplatin for AUC of 5, Alimta 500 mg/M2 and Keytruda 200 mg IV every 3 weeks on 9/23 2021.  Status post 4 cycles.  He is tolerating this treatment well except for fatigue and lack of appetite. I recommended for the patient to proceed with cycle #5 today which will be the first cycle of maintenance treatment with Alimta and Keytruda every 3 weeks. For the lack of appetite, he will continue his current treatment with Remeron. I will see the patient back for follow-up visit in 3 weeks for evaluation before the next cycle of his treatment. He was advised to call immediately if he has any concerning symptoms in the interval. The patient voices understanding of current disease status and treatment options and is in agreement with the current care plan. All questions were answered. The patient knows to call the clinic with any problems, questions or concerns. We can certainly see the patient much sooner if necessary.  Disclaimer: This note was dictated with voice recognition software. Similar sounding words can inadvertently be transcribed and may not be corrected upon review.

## 2020-11-21 NOTE — Telephone Encounter (Signed)
Scheduled per 12/30 los, patient received calender and is aware of upcoming appointments.

## 2020-11-21 NOTE — Progress Notes (Signed)
Ok to d/c neulasta onpro from today and remaining of tx plan

## 2020-11-22 ENCOUNTER — Encounter: Payer: Self-pay | Admitting: Cardiology

## 2020-11-22 DIAGNOSIS — C3490 Malignant neoplasm of unspecified part of unspecified bronchus or lung: Secondary | ICD-10-CM | POA: Insufficient documentation

## 2020-11-22 MED ORDER — EZETIMIBE 10 MG PO TABS: 10.0000 mg | ORAL_TABLET | Freq: Every day | ORAL | 2 refills | Status: AC

## 2020-11-22 MED ORDER — NITROGLYCERIN 0.4 MG SL SUBL: SUBLINGUAL_TABLET | SUBLINGUAL | 4 refills | Status: AC

## 2020-11-22 MED ORDER — APIXABAN 5 MG PO TABS: 5.0000 mg | ORAL_TABLET | Freq: Two times a day (BID) | ORAL | 2 refills | Status: AC

## 2020-11-22 MED ORDER — AMIODARONE HCL 200 MG PO TABS: 100.0000 mg | ORAL_TABLET | Freq: Every day | ORAL | Status: AC

## 2020-11-22 MED ORDER — ROSUVASTATIN CALCIUM 40 MG PO TABS: 40.0000 mg | ORAL_TABLET | Freq: Every day | ORAL | 3 refills | Status: AC

## 2020-11-22 NOTE — Assessment & Plan Note (Signed)
Last lipids showed LDL 79.  At this point I think continuing current dose of rosuvastatin and Zetia is reasonable.

## 2020-11-22 NOTE — Assessment & Plan Note (Signed)
He has lung cancer, unlikely to have pulmonary toxicity, however need to follow-up.  Unfortunately, PFTs were not helpful, nor would markers of inflammation

## 2020-11-22 NOTE — Assessment & Plan Note (Signed)
Now with combination of A. fib and stroke-CHA2DS2-VASc score of 6, he also has double was not triple malignancy all of which significantly increased is risk of thrombosis. He remains on Eliquis, we have stopped Plavix to avoid potential bleeding

## 2020-11-22 NOTE — Assessment & Plan Note (Signed)
No longer having issues with hypertension.  57 hypotension.  He is off all antihypertensive agents.

## 2020-11-22 NOTE — Assessment & Plan Note (Signed)
We previously plan to stop amiodarone, but have breakthrough not that long ago.  Plan: Continue monitoring and daily amiodarone as well as Eliquis.  Beta-blocker stopped last visit, concern for hypotension.

## 2020-11-22 NOTE — Assessment & Plan Note (Signed)
Blood pressure seems to be stabilized off all medications.  Low threshold to consider midodrine. Ensure adequate hydration and nutrition

## 2020-11-22 NOTE — Assessment & Plan Note (Addendum)
Extensive PCI to LCx and OM 1 with no current anginal symptoms.  Last evaluation showed no evidence of ischemia but infarct noted. Recently stopped Plavix because of Eliquis. Not on beta-blocker or other antihypertensives because of hypotension  Plan: Continue with low-dose amiodarone which does give some beta-blocker effect  Continue rosuvastatin plus ezetimibe.  Monitor for symptoms, but for now we will hold off on any further ischemic evaluation

## 2020-11-25 ENCOUNTER — Other Ambulatory Visit: Payer: Self-pay | Admitting: Physician Assistant

## 2020-11-25 ENCOUNTER — Telehealth: Payer: Self-pay

## 2020-11-25 DIAGNOSIS — R112 Nausea with vomiting, unspecified: Secondary | ICD-10-CM

## 2020-11-25 DIAGNOSIS — C3492 Malignant neoplasm of unspecified part of left bronchus or lung: Secondary | ICD-10-CM

## 2020-11-25 MED ORDER — PROCHLORPERAZINE MALEATE 10 MG PO TABS: 10.0000 mg | ORAL_TABLET | Freq: Four times a day (QID) | ORAL | 3 refills | Status: AC | PRN

## 2020-11-25 MED ORDER — ONDANSETRON 8 MG PO TBDP: 8.0000 mg | ORAL_TABLET | Freq: Three times a day (TID) | ORAL | 2 refills | Status: AC | PRN

## 2020-11-25 NOTE — Telephone Encounter (Signed)
Pt has been nauseous and vomiting since his 12/30 tx. Pt has been taking his rx of Compazine but has trouble keeping it down.  Please advise.

## 2020-11-27 ENCOUNTER — Other Ambulatory Visit: Payer: Self-pay

## 2020-11-27 ENCOUNTER — Inpatient Hospital Stay: Payer: Medicare Other

## 2020-11-27 ENCOUNTER — Inpatient Hospital Stay: Payer: Medicare Other | Attending: Internal Medicine | Admitting: Medical

## 2020-11-27 VITALS — BP 114/65 | HR 95 | Temp 98.7°F | Resp 18 | Ht 75.0 in | Wt 166.9 lb

## 2020-11-27 DIAGNOSIS — Z8673 Personal history of transient ischemic attack (TIA), and cerebral infarction without residual deficits: Secondary | ICD-10-CM | POA: Diagnosis not present

## 2020-11-27 DIAGNOSIS — R5382 Chronic fatigue, unspecified: Secondary | ICD-10-CM

## 2020-11-27 DIAGNOSIS — Z85828 Personal history of other malignant neoplasm of skin: Secondary | ICD-10-CM | POA: Diagnosis not present

## 2020-11-27 DIAGNOSIS — E785 Hyperlipidemia, unspecified: Secondary | ICD-10-CM | POA: Insufficient documentation

## 2020-11-27 DIAGNOSIS — Z803 Family history of malignant neoplasm of breast: Secondary | ICD-10-CM | POA: Insufficient documentation

## 2020-11-27 DIAGNOSIS — Z9221 Personal history of antineoplastic chemotherapy: Secondary | ICD-10-CM | POA: Insufficient documentation

## 2020-11-27 DIAGNOSIS — M1990 Unspecified osteoarthritis, unspecified site: Secondary | ICD-10-CM | POA: Insufficient documentation

## 2020-11-27 DIAGNOSIS — Z79899 Other long term (current) drug therapy: Secondary | ICD-10-CM | POA: Diagnosis not present

## 2020-11-27 DIAGNOSIS — N400 Enlarged prostate without lower urinary tract symptoms: Secondary | ICD-10-CM | POA: Diagnosis not present

## 2020-11-27 DIAGNOSIS — R531 Weakness: Secondary | ICD-10-CM | POA: Insufficient documentation

## 2020-11-27 DIAGNOSIS — K219 Gastro-esophageal reflux disease without esophagitis: Secondary | ICD-10-CM | POA: Insufficient documentation

## 2020-11-27 DIAGNOSIS — Z5112 Encounter for antineoplastic immunotherapy: Secondary | ICD-10-CM

## 2020-11-27 DIAGNOSIS — C3492 Malignant neoplasm of unspecified part of left bronchus or lung: Secondary | ICD-10-CM

## 2020-11-27 DIAGNOSIS — C7951 Secondary malignant neoplasm of bone: Secondary | ICD-10-CM | POA: Diagnosis not present

## 2020-11-27 DIAGNOSIS — I480 Paroxysmal atrial fibrillation: Secondary | ICD-10-CM | POA: Diagnosis not present

## 2020-11-27 DIAGNOSIS — E860 Dehydration: Secondary | ICD-10-CM | POA: Insufficient documentation

## 2020-11-27 DIAGNOSIS — I2510 Atherosclerotic heart disease of native coronary artery without angina pectoris: Secondary | ICD-10-CM | POA: Diagnosis not present

## 2020-11-27 DIAGNOSIS — R5383 Other fatigue: Secondary | ICD-10-CM | POA: Insufficient documentation

## 2020-11-27 DIAGNOSIS — I10 Essential (primary) hypertension: Secondary | ICD-10-CM | POA: Diagnosis not present

## 2020-11-27 DIAGNOSIS — Z9225 Personal history of immunosupression therapy: Secondary | ICD-10-CM | POA: Insufficient documentation

## 2020-11-27 DIAGNOSIS — R630 Anorexia: Secondary | ICD-10-CM | POA: Diagnosis not present

## 2020-11-27 DIAGNOSIS — Z8546 Personal history of malignant neoplasm of prostate: Secondary | ICD-10-CM | POA: Insufficient documentation

## 2020-11-27 LAB — CBC WITH DIFFERENTIAL (CANCER CENTER ONLY)
Abs Immature Granulocytes: 0.02 10*3/uL (ref 0.00–0.07)
Basophils Absolute: 0 10*3/uL (ref 0.0–0.1)
Basophils Relative: 0 %
Eosinophils Absolute: 0 10*3/uL (ref 0.0–0.5)
Eosinophils Relative: 0 %
HCT: 27.6 % — ABNORMAL LOW (ref 39.0–52.0)
Hemoglobin: 8.9 g/dL — ABNORMAL LOW (ref 13.0–17.0)
Immature Granulocytes: 1 %
Lymphocytes Relative: 7 %
Lymphs Abs: 0.1 10*3/uL — ABNORMAL LOW (ref 0.7–4.0)
MCH: 30.9 pg (ref 26.0–34.0)
MCHC: 32.2 g/dL (ref 30.0–36.0)
MCV: 95.8 fL (ref 80.0–100.0)
Monocytes Absolute: 0.1 10*3/uL (ref 0.1–1.0)
Monocytes Relative: 3 %
Neutro Abs: 1.6 10*3/uL — ABNORMAL LOW (ref 1.7–7.7)
Neutrophils Relative %: 89 %
Platelet Count: 83 10*3/uL — ABNORMAL LOW (ref 150–400)
RBC: 2.88 MIL/uL — ABNORMAL LOW (ref 4.22–5.81)
RDW: 16.7 % — ABNORMAL HIGH (ref 11.5–15.5)
WBC Count: 1.8 10*3/uL — ABNORMAL LOW (ref 4.0–10.5)
nRBC: 0 % (ref 0.0–0.2)

## 2020-11-27 LAB — CMP (CANCER CENTER ONLY)
ALT: 10 U/L (ref 0–44)
AST: 26 U/L (ref 15–41)
Albumin: 2.3 g/dL — ABNORMAL LOW (ref 3.5–5.0)
Alkaline Phosphatase: 114 U/L (ref 38–126)
Anion gap: 12 (ref 5–15)
BUN: 29 mg/dL — ABNORMAL HIGH (ref 8–23)
CO2: 29 mmol/L (ref 22–32)
Calcium: 8.9 mg/dL (ref 8.9–10.3)
Chloride: 103 mmol/L (ref 98–111)
Creatinine: 1.27 mg/dL — ABNORMAL HIGH (ref 0.61–1.24)
GFR, Estimated: 59 mL/min — ABNORMAL LOW (ref >60)
Glucose, Bld: 126 mg/dL — ABNORMAL HIGH (ref 70–99)
Potassium: 3.3 mmol/L — ABNORMAL LOW (ref 3.5–5.1)
Sodium: 144 mmol/L (ref 135–145)
Total Bilirubin: 1 mg/dL (ref 0.3–1.2)
Total Protein: 6.5 g/dL (ref 6.5–8.1)

## 2020-11-27 LAB — SAMPLE TO BLOOD BANK

## 2020-11-27 LAB — TSH: TSH: 2.059 u[IU]/mL (ref 0.320–4.118)

## 2020-11-27 MED ORDER — SODIUM CHLORIDE 0.9 % IV SOLN
Freq: Once | INTRAVENOUS | Status: AC
  Filled 2020-11-27: qty 250

## 2020-11-27 NOTE — Patient Instructions (Signed)

## 2020-11-27 NOTE — Progress Notes (Signed)
Final VS reviewed with PA Lucianne Lei, ok to d/c per PA.

## 2020-11-28 ENCOUNTER — Other Ambulatory Visit: Payer: Medicare Other

## 2020-11-29 ENCOUNTER — Telehealth: Payer: Self-pay

## 2020-11-29 NOTE — Progress Notes (Signed)
[facility] Progress Note   SRICHARAN LACOMB [id number] [birth year] 75 y.o.  [name] is managed by Dr. Fanny Bien. Mohamed  Actively treated with chemotherapy/immunotherapy/hormonal therapy: yes  Current therapy: Alimta and Keytruda  Last treated: 11/11/2020 (cycle #5, day #1)  Next scheduled appointment with provider: 12/11/2020  Assessment: Plan:    Dehydration - Plan: 0.9 %  sodium chloride infusion  Adenocarcinoma of left lung, stage 4 (HCC)  Metastasis to bone [facility])  Dehydration: Mr. Richman was given 1 liter of normal saline over 2 hours IV today.  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 Behney is status post cycle #5, day #1 of Alimta and Keytruda which was dosed on 11/11/2020. Mr. And Mrs. Ferrin would like to speak to hospice to see what services could be provided as they are considering stopping treatment and proceeding with hospice care.   Please see After Visit Summary for patient specific instructions.  Future Appointments  Date Time Provider [place]  12/11/2020 11:00 AM [facility] LAB [facility] None  12/11/2020 11:30 AM Heilingoetter, Cassandra L, PA-C [facility] None  12/11/2020 12:30 PM [facility] INFUSION [facility] None  01/02/2021 10:45 AM [facility] LAB [facility] None  01/02/2021 11:15 AM Curt Bears, MD [facility] None  01/02/2021 12:30 PM [facility] INFUSION [facility] None  01/23/2021 11:00 AM [facility] LAB [facility] None  01/23/2021 11:30 AM Heilingoetter, Cassandra L, PA-C [facility] None  01/23/2021 12:30 PM [facility] NURSE [facility] None  02/13/2021 10:00 AM [facility] LAB [facility] None  02/13/2021 10:45 AM Curt Bears, MD [facility] None  02/13/2021 11:30 AM [facility] INFUSION [facility] None  05/12/2021 10:00 AM [name], MD [facility]    No orders of the defined types were placed in this encounter.      Subjective:   Patient ID:   KAEDON FANELLI is a 75 y.o. (DOB 1946/04/23) male.  Chief Complaint:  Chief Complaint  Patient presents with  . Fatigue    HPI YOAN SALLADE is a 75 y.o. male with a diagnosis of a metastatic adenocarcinoma of the left lung with bane metastasis. Mr. Brach is followed by Dr. Julien Nordmann and is status post cycle #5, day #1 of Alimta and Keytruda which was dosed on 11/11/2020. Mr. Lips reports increasing fatigue, generalized and anorexia. Mr. And Mrs. Paulding would like to speak to hospice to see what services could be provided as they are considering stopping treatment and proceeding with hospice care.    Medications: I have reviewed the patient's current medications.  Allergies:  Allergies  Allergen Reactions  . Lipitor [Atorvastatin] Other (See Comments)    Increased HR is currently tolerating --   . Morphine And Related Nausea And Vomiting  . Vicodin [Hydrocodone-Acetaminophen] Other (See Comments)    Feel crazy    Past Medical History:  Diagnosis Date  . Arthritis    back  . BPH (benign prostatic hypertrophy)   . CAD S/P multivessel PCI: LAD, RCA and extensive LCX-OM1 2004   2004: s/p PCI to LAD, to RCA (now occluded),; 02/2011 - OM1 PCI -> 07/2011 then extensive PCI to Dominant LCx-OM1  (enitre prox-AVG Circ for dissection in 07/2011)  . Calcific aortic stenosis 01/2020   Mild to moderate  . Dyspnea   . Dysrhythmia    afib  . GERD (gastroesophageal reflux disease)   . History of colon polyps   . History of echocardiogram 08/16/2011   Echo - EF 60-65%; moderate LV concentric hypertrophy; abdnormal LV relaxation (grade 1 diastolic  dysfunction; ascending aorta mildly dilated; LA moderately dilated;   . Hyperlipidemia   . Hypertension   . 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 Lung cancer, primary, with metastasis from lung to other site, left ([place]) 2020-2021   LEFT: Stage IV (T3, N2, M1c)  non-small cell lung cancer, adenocarcinoma; malignant pleural effusion and bone metastasis  . PAF (paroxysmal atrial fibrillation) ([place]) 12/20/2012   followed by Dr. Glenetta Hew; s/p DCCV 2011, 07/2011 post PCI with Type 4a MI; CHA2DS2Vasc = 3, on Warfarin  . Paroxysmal atrial flutter ([place]) 08/20/2011   TEE- atrial septum - no defect identified; RA normal in size, no evidence of thrombus; ascending aorta normal  . Prostate cancer ([place]) 09/2019   Radioactive Seed Implantation; XRT (completed 03/2020), hormome Rx.  . Rupture quadriceps tendon   . Skin cancer    SKIN CA    Past Surgical History:  Procedure Laterality Date  . BACK SURGERY    . CHEST TUBE INSERTION Left 08/05/2020   Procedure: INSERTION LEFT PLEURAL DRAINAGE CATHETER;  Surgeon: Melrose Nakayama, MD;  Location: [place];  Service: Thoracic;  Laterality: Left;  . COLONOSCOPY    . CORONARY ANGIOPLASTY WITH STENT PLACEMENT  03/15/2012   multiple stens in AVGroove Circ & OM1;; 2 site PTCA w/ PTCA and stenting of OM2 and  PTCA of distal stent ISR followed by PTCA of mid LCx ISR  . CORONARY STENT PLACEMENT  2003 - 2011 - 2012 - 2013   Pre-2012 - BMS in RCA now known occluded, BMS in proximal LAD; 07/2011 - 2.25 x 23 BMS in OM 1 with significant disease on either side noted shortly after -->  2 additional overlapping DES 2.5 mm x 30 mm and 2.25 mm x 26 mm in OM1,; 3 overlapping Resolute DES in AV groove Cx crossing OM1: (Tapered from 3.8-2.6 mm)- Resolute DES 3.5 x 22, 3.0 x 38, 2.5 x 14  . ENDARTERECTOMY Left 02/27/2016   Procedure: LEFT  CAROTID ARTERY ENDARTERECTOMY ;  Surgeon: Serafina Mitchell, MD;  Location: [place];  Service: Vascular;  Laterality: Left;  . LEFT HEART CATHETERIZATION WITH CORONARY ANGIOGRAM N/A 03/15/2012   Procedure: LEFT HEART CATHETERIZATION WITH CORONARY ANGIOGRAM;  Surgeon: [name], MD;  Location: [facility] CATH LAB: patent LAD stents, patent LCx stents w/80% focal ISR just distal to OM; OM proximal stent open; --  2 site PTCA-PCI  . LUMBAR LAMINECTOMY/DECOMPRESSION MICRODISCECTOMY Bilateral 12/11/2014   Procedure: Bilateral Lumbar Three-Four Laminectomy;  Surgeon: Kristeen Miss, MD;  Location: [place] [facility];  Service: Neurosurgery;  Laterality: Bilateral;  bilateral  . NM MYOVIEW LTD  11/2012   No ischemia or infarction, normal EF & WM  . PATCH ANGIOPLASTY Left 02/27/2016   Procedure: WITH HEMASHIELD DACRON  PATCH ANGIOPLASTY;  Surgeon: Serafina Mitchell, MD;  Location: [place];  Service: Vascular;  Laterality: Left;  [name] QUADRICEPS TENDON REPAIR  09/19/2012   Procedure: REPAIR QUADRICEP TENDON;  Surgeon: [name], MD;  Location: [facility];  Service: Orthopedics;  Laterality: Left;  . TRANSTHORACIC ECHOCARDIOGRAM  01/2020   EF 55-60%.  Normal wall motion.  Moderate concentric LVH.  GRII DD with severe LA dilation..    Mild to moderate Calcific Aortic Stenosis.Mildly elevated PAP and borderline elevated RAP.  [name] VASECTOMY      Family History  Problem Relation Age of Onset  . Leukemia Mother   . Heart disease Father   .  Bladder Cancer Sister   . Prostate cancer Brother   . Breast cancer Maternal Grandmother     Social History   Socioeconomic History  . Marital status: Married    Spouse name: Not on file  . Number of children: 2  . Years of education: Not on file  . Highest education level: Not on file  Occupational History  . Not on file  Tobacco Use  . Smoking status: Never Smoker  . Smokeless tobacco: Never Used  Vaping Use  . Vaping Use: Never used  Substance and Sexual Activity  . Alcohol use: Yes    Alcohol/week: 2.0 standard drinks    Types: 2 Cans of beer per week    Comment: rare  . Drug use: No  . Sexual activity: Not Currently  Other Topics Concern  . Not on file  Social History Narrative   [name] is a father of 2, grandfather of 61.  His daughter Barbaraann Share was classmates with Dr. Ellyn Hack in college.   He and his wife are currently in the process of moving houses, further down the  street in order to downsize to a single-story house.  He has been working out at [employer].      Social Determinants of Health   Financial Resource Strain: Not on file  Food Insecurity: Not on file  Transportation Needs: Not on file  Physical Activity: Not on file  Stress: Not on file  Social Connections: Not on file  Intimate Partner Violence: Not on file    Past Medical History, Surgical history, Social history, and Family history were reviewed and updated as appropriate.   Please see review of systems for further details on the patient's review from today.   Review of Systems:  Review of Systems  Constitutional: Positive for appetite change and fatigue. Negative for chills, diaphoresis and fever.  HENT: Negative for trouble swallowing and voice change.   Respiratory: Negative for cough, chest tightness, shortness of breath and wheezing.   Cardiovascular: Negative for chest pain and palpitations.  Gastrointestinal: Negative for abdominal pain, constipation, diarrhea, nausea and vomiting.  Musculoskeletal: Negative for back pain and myalgias.  Neurological: Positive for weakness. Negative for dizziness, light-headedness and headaches.    Objective:   Physical Exam:  BP 114/65   Pulse 95   Temp 98.7 F (37.1 C) (Tympanic)   Resp 18   Ht 6\' 3"  (1.905 m)   Wt 166 lb 14.4 oz (75.7 kg)   SpO2 100%   BMI 20.86 kg/m  ECOG: 2  Physical Exam Constitutional:      General: He is not in acute distress.    Appearance: He is not diaphoretic.  HENT:     Head: Normocephalic and atraumatic.  Cardiovascular:     Rate and Rhythm: Normal rate and regular rhythm.     Heart sounds: Normal heart sounds. No murmur heard. No friction rub. No gallop.   Pulmonary:     Effort: Pulmonary effort is normal. No respiratory distress.     Breath sounds: Normal breath sounds. No wheezing or rales.  Skin:    General: Skin is warm and dry.     Findings: No erythema or rash.  Neurological:      Mental Status: He is alert.     Coordination: Coordination normal.     Gait: Gait abnormal (The patient is ambulating with a wheelchair.).  Psychiatric:        Mood and Affect: Mood normal.  Behavior: Behavior normal.        Thought Content: Thought content normal.        Judgment: Judgment normal.     Lab Review:     Component Value Date/Time   NA 144 11/27/2020 1233   NA 140 08/15/2019 0936   K 3.3 (L) 11/27/2020 1233   CL 103 11/27/2020 1233   CO2 29 11/27/2020 1233   GLUCOSE 126 (H) 11/27/2020 1233   GLUCOSE 103 (H) 09/06/2006 0902   BUN 29 (H) 11/27/2020 1233   BUN 27 08/15/2019 0936   CREATININE 1.27 (H) 11/27/2020 1233   CREATININE 1.43 (H) 07/29/2016 0814   CALCIUM 8.9 11/27/2020 1233   PROT 6.5 11/27/2020 1233   PROT 6.6 08/15/2019 0936   ALBUMIN 2.3 (L) 11/27/2020 1233   ALBUMIN 4.3 08/15/2019 0936   AST 26 11/27/2020 1233   ALT 10 11/27/2020 1233   ALKPHOS 114 11/27/2020 1233   BILITOT 1.0 11/27/2020 1233   GFRNONAA 59 (L) 11/27/2020 1233   GFRAA >60 08/22/2020 0750       Component Value Date/Time   WBC 1.8 (L) 11/27/2020 1233   WBC 4.6 08/02/2020 0959   RBC 2.88 (L) 11/27/2020 1233   HGB 8.9 (L) 11/27/2020 1233   HCT 27.6 (L) 11/27/2020 1233   PLT 83 (L) 11/27/2020 1233   MCV 95.8 11/27/2020 1233   MCH 30.9 11/27/2020 1233   MCHC 32.2 11/27/2020 1233   RDW 16.7 (H) 11/27/2020 1233   LYMPHSABS 0.1 (L) 11/27/2020 1233   MONOABS 0.1 11/27/2020 1233   EOSABS 0.0 11/27/2020 1233   BASOSABS 0.0 11/27/2020 1233   -------------------------------  Imaging from last 24 hours (if applicable):  Radiology interpretation: DG Chest 2 View  Result Date: 11/05/2020 CLINICAL DATA:  History of lung carcinoma with left-sided pleural effusion EXAM: CHEST - 2 VIEW COMPARISON:  10/23/2020 FINDINGS: Cardiac shadow is stable. Right lung remains clear. Left lung shows persistent basilar effusion tracking along the lateral aspect. Aortic calcifications are seen.  No bony abnormality is noted. IMPRESSION: Persistent left-sided pleural effusion similar to that seen on prior CT. No acute abnormality is noted. Electronically Signed   By: Inez Catalina M.D.   On: 11/05/2020 14:04        This case was discussed with Dr. Julien Nordmann. He expressed agreement with my management of this patient.

## 2020-11-29 NOTE — Telephone Encounter (Signed)
Per Sandi Mealy, PA patient and his wife requested to speak to someone from [place] to get information about hospice services. Called [facility] and spoke to [place] and gave her the patient's information. Alwyn Ren stated someone will reach out to the patient.

## 2020-12-02 ENCOUNTER — Telehealth: Payer: Self-pay

## 2020-12-02 NOTE — Telephone Encounter (Signed)
Pt wife called stating the pt starting having LLQ abdominal pain on 11/29/20 and it seems to be worsening. She wants to know if they should call their PCP or see [facility] again.   Discussed with Dr. Julien Nordmann and he advised pt should probably see the PCP for evaluation or they can wait for [facility] to re-opens (12/04/20).  I called pts wife back and advised as indicated. She states the pt wants to wait to see [facility]. I have sent a schedule message to add the pt to the [facility] schedule for Wednesday.

## 2020-12-03 ENCOUNTER — Telehealth: Payer: Self-pay | Admitting: Medical

## 2020-12-03 NOTE — Telephone Encounter (Signed)
Scheduled appointment per 1/10 sch msg. Spoke to patient's wife who is aware of appointment date and time.

## 2020-12-04 ENCOUNTER — Emergency Department (HOSPITAL_COMMUNITY)
Admission: EM | Admit: 2020-12-04 | Discharge: 2020-12-05 | Disposition: A | Discharge status: Home / Self Care | Payer: Medicare Other | Attending: Emergency Medicine | Admitting: Emergency Medicine

## 2020-12-04 ENCOUNTER — Emergency Department (HOSPITAL_COMMUNITY): Payer: Medicare Other

## 2020-12-04 ENCOUNTER — Encounter: Payer: Medicare Other | Admitting: Physician Assistant

## 2020-12-04 ENCOUNTER — Telehealth: Payer: Self-pay

## 2020-12-04 ENCOUNTER — Other Ambulatory Visit: Payer: Self-pay

## 2020-12-04 ENCOUNTER — Encounter (HOSPITAL_COMMUNITY): Payer: Self-pay

## 2020-12-04 DIAGNOSIS — E860 Dehydration: Secondary | ICD-10-CM | POA: Insufficient documentation

## 2020-12-04 DIAGNOSIS — Z7901 Long term (current) use of anticoagulants: Secondary | ICD-10-CM | POA: Insufficient documentation

## 2020-12-04 DIAGNOSIS — Z955 Presence of coronary angioplasty implant and graft: Secondary | ICD-10-CM | POA: Insufficient documentation

## 2020-12-04 DIAGNOSIS — N183 Chronic kidney disease, stage 3 unspecified: Secondary | ICD-10-CM | POA: Diagnosis not present

## 2020-12-04 DIAGNOSIS — I129 Hypertensive chronic kidney disease with stage 1 through stage 4 chronic kidney disease, or unspecified chronic kidney disease: Secondary | ICD-10-CM | POA: Diagnosis not present

## 2020-12-04 DIAGNOSIS — R55 Syncope and collapse: Secondary | ICD-10-CM | POA: Insufficient documentation

## 2020-12-04 DIAGNOSIS — I25110 Atherosclerotic heart disease of native coronary artery with unstable angina pectoris: Secondary | ICD-10-CM | POA: Diagnosis not present

## 2020-12-04 DIAGNOSIS — Z85118 Personal history of other malignant neoplasm of bronchus and lung: Secondary | ICD-10-CM | POA: Diagnosis not present

## 2020-12-04 DIAGNOSIS — Z85828 Personal history of other malignant neoplasm of skin: Secondary | ICD-10-CM | POA: Insufficient documentation

## 2020-12-04 DIAGNOSIS — N179 Acute kidney failure, unspecified: Secondary | ICD-10-CM | POA: Insufficient documentation

## 2020-12-04 LAB — CBC WITH DIFFERENTIAL/PLATELET
Abs Immature Granulocytes: 0 10*3/uL (ref 0.00–0.07)
Basophils Absolute: 0 10*3/uL (ref 0.0–0.1)
Basophils Relative: 0 %
Eosinophils Absolute: 0 10*3/uL (ref 0.0–0.5)
Eosinophils Relative: 1 %
HCT: 27.1 % — ABNORMAL LOW (ref 39.0–52.0)
Hemoglobin: 8.7 g/dL — ABNORMAL LOW (ref 13.0–17.0)
Immature Granulocytes: 0 %
Lymphocytes Relative: 4 %
Lymphs Abs: 0.1 10*3/uL — ABNORMAL LOW (ref 0.7–4.0)
MCH: 31 pg (ref 26.0–34.0)
MCHC: 32.1 g/dL (ref 30.0–36.0)
MCV: 96.4 fL (ref 80.0–100.0)
Monocytes Absolute: 0.5 10*3/uL (ref 0.1–1.0)
Monocytes Relative: 21 %
Neutro Abs: 1.7 10*3/uL (ref 1.7–7.7)
Neutrophils Relative %: 74 %
Platelets: 82 10*3/uL — ABNORMAL LOW (ref 150–400)
RBC: 2.81 MIL/uL — ABNORMAL LOW (ref 4.22–5.81)
RDW: 16.3 % — ABNORMAL HIGH (ref 11.5–15.5)
WBC: 2.2 10*3/uL — ABNORMAL LOW (ref 4.0–10.5)
nRBC: 0 % (ref 0.0–0.2)

## 2020-12-04 LAB — BASIC METABOLIC PANEL WITH GFR
Anion gap: 11 (ref 5–15)
BUN: 31 mg/dL — ABNORMAL HIGH (ref 8–23)
CO2: 30 mmol/L (ref 22–32)
Calcium: 8.6 mg/dL — ABNORMAL LOW (ref 8.9–10.3)
Chloride: 101 mmol/L (ref 98–111)
Creatinine, Ser: 1.97 mg/dL — ABNORMAL HIGH (ref 0.61–1.24)
GFR, Estimated: 35 mL/min — ABNORMAL LOW
Glucose, Bld: 115 mg/dL — ABNORMAL HIGH (ref 70–99)
Potassium: 5 mmol/L (ref 3.5–5.1)
Sodium: 142 mmol/L (ref 135–145)

## 2020-12-04 LAB — HEPATIC FUNCTION PANEL
ALT: 20 U/L (ref 0–44)
AST: 54 U/L — ABNORMAL HIGH (ref 15–41)
Albumin: 2.4 g/dL — ABNORMAL LOW (ref 3.5–5.0)
Alkaline Phosphatase: 130 U/L — ABNORMAL HIGH (ref 38–126)
Bilirubin, Direct: 0.7 mg/dL — ABNORMAL HIGH (ref 0.0–0.2)
Indirect Bilirubin: 1.7 mg/dL — ABNORMAL HIGH (ref 0.3–0.9)
Total Bilirubin: 2.4 mg/dL — ABNORMAL HIGH (ref 0.3–1.2)
Total Protein: 6.6 g/dL (ref 6.5–8.1)

## 2020-12-04 LAB — MAGNESIUM: Magnesium: 2.1 mg/dL (ref 1.7–2.4)

## 2020-12-04 MED ORDER — SODIUM CHLORIDE 0.9 % IV BOLUS
500.0000 mL | Freq: Once | INTRAVENOUS | Status: AC
  Administered 2020-12-04: 500 mL via INTRAVENOUS

## 2020-12-04 NOTE — ED Notes (Signed)
Called [facility] transport to transport home

## 2020-12-04 NOTE — Progress Notes (Signed)
[facility])  This patient is an existing referral to [facility] for hospice services at home.  [facility] will continue to follow for any discharge planning needs and to coordinate admission onto palliative care.   If you have questions or need assistance, please call [phone] or contact the hospital Liaison listed on AMION.     Thank you for the opportunity to participate in this patient's care.     Domenic Moras, BSN, RN [facility] Liaison   [phone] (24h on call)

## 2020-12-04 NOTE — ED Notes (Signed)
seizure pads applied to bed

## 2020-12-04 NOTE — ED Notes (Signed)
attempted to call wife with no answer

## 2020-12-04 NOTE — ED Provider Notes (Signed)
[facility] DEPT Provider Note   CSN: [id number] Arrival date & time: 12/04/20  1105     History Chief Complaint  Patient presents with  . Seizures    Charles Robinson is a 75 y.o. male.  Presents to ER with concern for seizure-like episode.  History obtained from patient and wife.  Patient reports that he has not been eating or drinking particularly well lately, concerned he may be dehydrated.  Has had some nausea but no vomiting.  Denies any symptoms at present.  Wife states that after patient stood up from the toilet he had 2 seizure-like episodes.  Each lasted around 20 seconds and were separated by just a brief time.  Patient looked like he was going to pass out had upper and lower arm and leg shaking but did not completely pass out.  No bladder or bowel incontinence.  Patient has a hard time recalling the events.  Unsure if he passed out.  Denies prior history of seizures or recent syncopal episodes.  HPI     Past Medical History:  Diagnosis Date  . Arthritis    back  . BPH (benign prostatic hypertrophy)   . CAD S/P multivessel PCI: LAD, RCA and extensive LCX-OM1 2004   2004: s/p PCI to LAD, to RCA (now occluded),; 02/2011 - OM1 PCI -> 07/2011 then extensive PCI to Dominant LCx-OM1  (enitre prox-AVG Circ for dissection in 07/2011)  . Calcific aortic stenosis 01/2020   Mild to moderate  . Dyspnea   . Dysrhythmia    afib  . GERD (gastroesophageal reflux disease)   . History of colon polyps   . History of echocardiogram 08/16/2011   Echo - EF 60-65%; moderate LV concentric hypertrophy; abdnormal LV relaxation (grade 1 diastolic dysfunction; ascending aorta mildly dilated; LA moderately dilated;   . Hyperlipidemia   . Hypertension   . 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 Lung cancer, primary, with metastasis from lung to other site, left ([place]) 2020-2021   LEFT:  Stage IV (T3, N2, M1c) non-small cell lung cancer, adenocarcinoma; malignant pleural effusion and bone metastasis  . PAF (paroxysmal atrial fibrillation) ([place]) 12/20/2012   followed by Dr. Glenetta Hew; s/p DCCV 2011, 07/2011 post PCI with Type 4a MI; CHA2DS2Vasc = 3, on Warfarin  . Paroxysmal atrial flutter ([place]) 08/20/2011   TEE- atrial septum - no defect identified; RA normal in size, no evidence of thrombus; ascending aorta normal  . Prostate cancer ([place]) 09/2019   Radioactive Seed Implantation; XRT (completed 03/2020), hormome Rx.  . Rupture quadriceps tendon   . Skin cancer    SKIN CA    Patient Active Problem List   Diagnosis Date Noted  . Lung cancer ([place])   . Hypotension 10/03/2020  . Chemotherapy induced neutropenia ([place]) 09/05/2020  . Metastasis to bone ([place]) 08/12/2020  . Encounter for antineoplastic immunotherapy 08/08/2020  . Adenocarcinoma of left lung, stage 4 ([place]) 07/26/2020  . Encounter for antineoplastic chemotherapy 07/26/2020  . Goals of care, counseling/discussion 07/26/2020  . Pleural effusion on left 07/17/2020  . Mass of upper lobe of left lung 07/17/2020  . Systolic murmur of aorta 95/07/3266  . Malignant neoplasm of prostate ([place]) 11/04/2019  . Pre-operative clearance 08/15/2019  . Left carotid artery stenosis 02/10/2016  . Chronic renal insufficiency, stage III (moderate) ([place]) 11/19/2015  . 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 Antiplatelet or antithrombotic long-term use 11/13/2014  .  Obesity (BMI 30-39.9) 05/03/2014  . On amiodarone therapy 05/03/2014  . Chronic anticoagulation 04/21/2013  . Hyponatremia 09/21/2012  . Expected blood loss anemia 09/20/2012  . S/P left quad tendon repair 09/19/2012  . CAD, s/p multiple PCIs 06/28/2011    Class: Chronic  . PAF (paroxysmal atrial fibrillation) (HCC) CHA2DS2-VASc score-6 (age & CVA -2each, HTN, MI/CAD) 01/13/2011    Class: History of  . EUSTACHIAN TUBE DYSFUNCTION, LEFT  01/30/2008  . BENIGN PROSTATIC HYPERTROPHY, WITH URINARY OBSTRUCTION 10/24/2007  . Dyslipidemia, goal LDL below 70 06/24/2007    Class: Chronic  . Essential hypertension 06/24/2007    Class: Diagnosis of  . Coronary artery disease involving native coronary artery of native heart with angina pectoris ([place]) 06/24/2007  . COLONIC POLYPS, HX OF 06/24/2007    Past Surgical History:  Procedure Laterality Date  . BACK SURGERY    . CHEST TUBE INSERTION Left 08/05/2020   Procedure: INSERTION LEFT PLEURAL DRAINAGE CATHETER;  Surgeon: Melrose Nakayama, MD;  Location: [facility];  Service: Thoracic;  Laterality: Left;  . COLONOSCOPY    . CORONARY ANGIOPLASTY WITH STENT PLACEMENT  03/15/2012   multiple stens in AVGroove Circ & OM1;; 2 site PTCA w/ PTCA and stenting of OM2 and  PTCA of distal stent ISR followed by PTCA of mid LCx ISR  . CORONARY STENT PLACEMENT  2003 - 2011 - 2012 - 2013   Pre-2012 - BMS in RCA now known occluded, BMS in proximal LAD; 07/2011 - 2.25 x 23 BMS in OM 1 with significant disease on either side noted shortly after -->  2 additional overlapping DES 2.5 mm x 30 mm and 2.25 mm x 26 mm in OM1,; 3 overlapping Resolute DES in AV groove Cx crossing OM1: (Tapered from 3.8-2.6 mm)- Resolute DES 3.5 x 22, 3.0 x 38, 2.5 x 14  . ENDARTERECTOMY Left 02/27/2016   Procedure: LEFT  CAROTID ARTERY ENDARTERECTOMY ;  Surgeon: Serafina Mitchell, MD;  Location: [facility];  Service: Vascular;  Laterality: Left;  . LEFT HEART CATHETERIZATION WITH CORONARY ANGIOGRAM N/A 03/15/2012   Procedure: LEFT HEART CATHETERIZATION WITH CORONARY ANGIOGRAM;  Surgeon: [name], MD;  Location[facility] CATH LAB: patent LAD stents, patent LCx stents w/80% focal ISR just distal to OM; OM proximal stent open; -- 2 site PTCA-PCI  . LUMBAR LAMINECTOMY/DECOMPRESSION MICRODISCECTOMY Bilateral 12/11/2014   Procedure: Bilateral Lumbar Three-Four Laminectomy;  Surgeon: Kristeen Miss, MD;  Location: [facility];  Service: Neurosurgery;   Laterality: Bilateral;  bilateral  . NM MYOVIEW LTD  11/2012   No ischemia or infarction, normal EF & WM  . PATCH ANGIOPLASTY Left 02/27/2016   Procedure: WITH HEMASHIELD DACRON  PATCH ANGIOPLASTY;  Surgeon: Serafina Mitchell, MD;  Location: [facility];  Service: Vascular;  Laterality: Left;  [name] QUADRICEPS TENDON REPAIR  09/19/2012   Procedure: REPAIR QUADRICEP TENDON;  Surgeon: [name], MD;  Location: [facility];  Service: Orthopedics;  Laterality: Left;  . TRANSTHORACIC ECHOCARDIOGRAM  01/2020   EF 55-60%.  Normal wall motion.  Moderate concentric LVH.  GRII DD with severe LA dilation..    Mild to moderate Calcific Aortic Stenosis.Mildly elevated PAP and borderline elevated RAP.  [name] VASECTOMY         Family History  Problem Relation Age of Onset  . Leukemia Mother   . Heart disease Father   . Bladder Cancer Sister   . Prostate cancer Brother   . Breast cancer Maternal Grandmother     Social History  Tobacco Use  . Smoking status: Never Smoker  . Smokeless tobacco: Never Used  Vaping Use  . Vaping Use: Never used  Substance Use Topics  . Alcohol use: Yes    Alcohol/week: 2.0 standard drinks    Types: 2 Cans of beer per week    Comment: rare  . Drug use: No    Home Medications Prior to Admission medications   Medication Sig Start Date End Date Taking? Authorizing Provider  acetaminophen (TYLENOL) 325 MG tablet Take 650 mg by mouth every 6 (six) hours as needed for moderate pain.   Yes [provider]  amiodarone (PACERONE) 200 MG tablet Take 0.5 tablets (100 mg total) by mouth daily. 11/22/20  Yes [name], MD  apixaban (ELIQUIS) 5 MG TABS tablet Take 1 tablet (5 mg total) by mouth 2 (two) times daily. 11/22/20  Yes [name], MD  Coenzyme Q10 200 MG capsule Take 200 mg by mouth every morning.    Yes [provider]  ezetimibe (ZETIA) 10 MG tablet Take 1 tablet (10 mg total) by mouth daily. 11/22/20  Yes [name], MD  folic acid (FOLVITE)  1 MG tablet Take 1 tablet (1 mg total) by mouth daily. 08/08/20  Yes Curt Bears, MD  HYDROcodone-homatropine [facility]) 5-1.5 MG/5ML syrup Take 5 mLs by mouth every 6 (six) hours as needed for cough. 10/04/20  Yes Heilingoetter, Cassandra L, PA-C  mirtazapine (REMERON) 30 MG tablet TAKE 1 TABLET BY MOUTH AT BEDTIME. Patient taking differently: Take 30 mg by mouth at bedtime. 09/02/20  Yes Curt Bears, MD  nitroGLYCERIN (NITROSTAT) 0.4 MG SL tablet PLACE 1 TABLET (0.4 MG TOTAL) UNDER THE TONGUE EVERY 5 (FIVE) MINUTES AS NEEDED. FOR CHEST PAIN Patient taking differently: Place 0.4 mg under the tongue every 5 (five) minutes as needed for chest pain. 11/22/20  Yes [name], MD  Omega-3 Fatty Acids (FISH OIL) 1000 MG CAPS Take 1 capsule (1,000 mg total) by mouth 2 (two) times daily. Patient taking differently: Take 1 capsule by mouth daily. 08/05/20  Yes Melrose Nakayama, MD  ondansetron (ZOFRAN ODT) 8 MG disintegrating tablet Take 1 tablet (8 mg total) by mouth every 8 (eight) hours as needed for nausea or vomiting. 11/25/20   Heilingoetter, Cassandra L, PA-C  prochlorperazine (COMPAZINE) 10 MG tablet Take 1 tablet (10 mg total) by mouth every 6 (six) hours as needed for nausea or vomiting. 11/25/20  Yes Heilingoetter, Cassandra L, PA-C  rosuvastatin (CRESTOR) 40 MG tablet Take 1 tablet (40 mg total) by mouth daily. 11/22/20  Yes [name], MD  tamsulosin (FLOMAX) 0.4 MG CAPS capsule Take 0.4 mg by mouth daily. 05/23/20  Yes [provider]    Allergies    Lipitor [atorvastatin], Morphine and related, and Vicodin [hydrocodone-acetaminophen]  Review of Systems   Review of Systems  Constitutional: Negative for chills and fever.  HENT: Negative for ear pain and sore throat.   Eyes: Negative for pain and visual disturbance.  Respiratory: Negative for cough and shortness of breath.   Cardiovascular: Negative for chest pain and palpitations.  Gastrointestinal: Negative  for abdominal pain and vomiting.  Genitourinary: Negative for dysuria and hematuria.  Musculoskeletal: Negative for arthralgias and back pain.  Skin: Negative for color change and rash.  Neurological: Positive for seizures and syncope.  All other systems reviewed and are negative.   Physical Exam Updated Vital Signs BP 108/90   Pulse 93   Temp 97.7 F (36.5 C)  Resp 20   Ht 6\' 3"  (1.905 m)   Wt 75 kg   SpO2 95%   BMI 20.67 kg/m   Physical Exam Vitals and nursing note reviewed.  Constitutional:      Appearance: He is well-developed and well-nourished.     Comments: Chronically ill-appearing but no acute distress  HENT:     Head: Normocephalic and atraumatic.  Eyes:     Conjunctiva/sclera: Conjunctivae normal.  Cardiovascular:     Rate and Rhythm: Normal rate and regular rhythm.     Heart sounds: No murmur heard.   Pulmonary:     Effort: Pulmonary effort is normal. No respiratory distress.     Breath sounds: Normal breath sounds.  Abdominal:     Palpations: Abdomen is soft.     Tenderness: There is no abdominal tenderness.  Musculoskeletal:        General: No deformity, signs of injury or edema.     Cervical back: Neck supple.  Skin:    General: Skin is warm and dry.  Neurological:     General: No focal deficit present.     Mental Status: He is alert and oriented to person, place, and time.     Comments: AAOx3 CN 2-12 intact, speech clear visual fields intact 5/5 strength in b/l UE and LE Sensation to light touch intact in b/l UE and LE Normal FNF Normal gait  Psychiatric:        Mood and Affect: Mood and affect and mood normal.        Behavior: Behavior normal.     ED Results / Procedures / Treatments   Labs (all labs ordered are listed, but only abnormal results are displayed) Labs Reviewed  CBC WITH DIFFERENTIAL/PLATELET - Abnormal; Notable for the following components:      Result Value   WBC 2.2 (*)    RBC 2.81 (*)    Hemoglobin 8.7 (*)    HCT  27.1 (*)    RDW 16.3 (*)    Platelets 82 (*)    Lymphs Abs 0.1 (*)    All other components within normal limits  BASIC METABOLIC PANEL - Abnormal; Notable for the following components:   Glucose, Bld 115 (*)    BUN 31 (*)    Creatinine, Ser 1.97 (*)    Calcium 8.6 (*)    GFR, Estimated 35 (*)    All other components within normal limits  HEPATIC FUNCTION PANEL - Abnormal; Notable for the following components:   Albumin 2.4 (*)    AST 54 (*)    Alkaline Phosphatase 130 (*)    Total Bilirubin 2.4 (*)    Bilirubin, Direct 0.7 (*)    Indirect Bilirubin 1.7 (*)    All other components within normal limits  MAGNESIUM    EKG EKG Interpretation  Date/Time:  Wednesday December 04 2020 12:14:37 EST Ventricular Rate:  89 PR Interval:    QRS Duration: 104 QT Interval:  431 QTC Calculation: 525 R Axis:   86 Text Interpretation: Sinus rhythm Atrial premature complex Borderline right axis deviation Borderline repolarization abnormality Prolonged QT interval Confirmed by Madalyn Rob [phone]) on 12/04/2020 1:57:28 PM   Radiology CT Head Wo Contrast  Result Date: 12/04/2020 CLINICAL DATA:  Seizure EXAM: CT HEAD WITHOUT CONTRAST TECHNIQUE: Contiguous axial images were obtained from the base of the skull through the vertex without intravenous contrast. COMPARISON:  None. FINDINGS: Brain: There is mild diffuse atrophy. There is no intracranial mass, hemorrhage, extra-axial fluid collection, or  midline shift. There is slight small vessel disease in the centra semiovale bilaterally. There is small vessel disease with prior lacunar type infarcts in the anterior and posterior limbs of the right internal capsule. No acute appearing infarct evident. Vascular: No hyperdense vessel. There is calcification in each carotid siphon region as well as in the distal right vertebral artery. Skull: Bony calvarium appears intact. Sinuses/Orbits: There is mucosal thickening in the right maxillary antrum. There is  mucosal thickening in several ethmoid air cells. Orbits appear symmetric bilaterally. Other: Visualized mastoid air cells clear. IMPRESSION: Atrophy with mild periventricular small vessel disease. Small vessel disease in the right internal capsule. No acute infarct. No mass or hemorrhage. There are foci of arterial vascular calcification. There is mucosal thickening in several paranasal sinus regions. Electronically Signed   By: Lowella Grip III M.D.   On: 12/04/2020 13:49   DG Chest Portable 1 View  Result Date: 12/04/2020 CLINICAL DATA:  Seizures, lung cancer, evaluate for complications, aspiration EXAM: PORTABLE CHEST 1 VIEW COMPARISON:  11/05/2020 FINDINGS: No significant interval change in examination of the chest, with a small, loculated left pleural effusion and associated atelectasis or consolidation. The right lung is normally aerated. Mild cardiomegaly. IMPRESSION: No significant interval change in examination of the chest, with a small, loculated left pleural effusion and associated atelectasis or consolidation. The right lung is normally aerated. No acute appearing airspace opacity. Electronically Signed   By: Eddie Candle M.D.   On: 12/04/2020 12:39    Procedures Procedures (including critical care time)  Medications Ordered in ED Medications  sodium chloride 0.9 % bolus 500 mL (0 mLs Intravenous Stopped 12/04/20 1514)  sodium chloride 0.9 % bolus 500 mL (500 mLs Intravenous New Bag/Given 12/04/20 1515)    ED Course  I have reviewed the triage vital signs and the nursing notes.  Pertinent labs & imaging results that were available during my care of the patient were reviewed by me and considered in my medical decision making (see chart for details).    MDM Rules/Calculators/A&P                         75 year old male presents to ER after having seizure episode.  Upon further clarification, suspect more likely near syncopal episodes or syncope with myoclonic jerks.  On physical  exam, patient noted to be chronically ill-appearing but was in no acute distress and had stable vital signs.  Blood work notable for acute kidney injury.  Suspect related to dehydration.  CT head and other labs were grossly stable.  Patient has advanced lung cancer.  Wife states that they are working on enrolling him into hospice care.  Their general focus for his care is maximizing quality and comfort.  Offered hospital admission for further IV rehydration and hospitalization, but both patient and wife wish to go home at this time if possible.  Given his broader clinical picture, believe this is a reasonable option.  Reviewed return precautions.  Asked case management to confirm home health/hospice situation.   Final Clinical Impression(s) / ED Diagnoses Final diagnoses:  Dehydration  AKI (acute kidney injury) ([place])  Near syncope    Rx / DC Orders ED Discharge Orders         [place]        12/04/20 1455    Face-to-face encounter (required for Medicare/Medicaid patients)       Comments: I Lucrezia Starch certify that this  patient is under my care and that I, or a nurse practitioner or physician's assistant working with me, had a face-to-face encounter that meets the physician face-to-face encounter requirements with this patient on 12/04/2020. The encounter with the patient was in whole, or in part for the following medical condition(s) which is the primary reason for home health care (List medical condition): unstable, fatal lung cancer, can't walk   12/04/20 1455           Lucrezia Starch, MD 12/04/20 1546

## 2020-12-04 NOTE — Progress Notes (Signed)
.   Transition of Care [facility]) - Emergency Department Mini Assessment   Patient Details  Name: Charles Robinson MRN: [id number] Date of Birth: January 16, 1946  Transition of Care [facility]) CM/SW Contact:    Erenest Rasher, RN Phone Number: [phone] 12/04/2020, 3:14 PM   Clinical Narrative: TOC CM spoke to Ms King, [place]. They have an admission date for pt on 12/05/2020 for Home Hospice. Spoke to wife and she is in agreement. States no DME needed at this time. She has hired Data processing manager duty caregivers that will come tomorrow. Will need PTAR ride home. Updated ED provider and RN.    ED Mini Assessment: What brought you to the Emergency Department? : weakness, fall, seizure  Barriers to Discharge: No Barriers Identified  Barrier interventions: arranged [place] of departure: Ambulance  Interventions which prevented an admission or readmission: Other (must enter comment)    Patient Contact and Communications        ,                 Admission diagnosis:  seizures Patient Active Problem List   Diagnosis Date Noted  . Lung cancer ([place])   . Hypotension 10/03/2020  . Chemotherapy induced neutropenia ([place]) 09/05/2020  . Metastasis to bone ([place]) 08/12/2020  . Encounter for antineoplastic immunotherapy 08/08/2020  . Adenocarcinoma of left lung, stage 4 ([place]) 07/26/2020  . Encounter for antineoplastic chemotherapy 07/26/2020  . Goals of care, counseling/discussion 07/26/2020  . Pleural effusion on left 07/17/2020  . Mass of upper lobe of left lung 07/17/2020  . Systolic murmur of aorta 32/44/0102  . Malignant neoplasm of prostate ([place]) 11/04/2019  . Pre-operative clearance 08/15/2019  . Left carotid artery stenosis 02/10/2016  . Chronic renal insufficiency, stage III (moderate) ([place]) 11/19/2015  . 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 Antiplatelet or antithrombotic long-term use 11/13/2014  . Obesity (BMI 30-39.9) 05/03/2014  . On  amiodarone therapy 05/03/2014  . Chronic anticoagulation 04/21/2013  . Hyponatremia 09/21/2012  . Expected blood loss anemia 09/20/2012  . S/P left quad tendon repair 09/19/2012  . CAD, s/p multiple PCIs 06/28/2011    Class: Chronic  . PAF (paroxysmal atrial fibrillation) (HCC) CHA2DS2-VASc score-6 (age & CVA -2each, HTN, MI/CAD) 01/13/2011    Class: History of  . EUSTACHIAN TUBE DYSFUNCTION, LEFT 01/30/2008  . BENIGN PROSTATIC HYPERTROPHY, WITH URINARY OBSTRUCTION 10/24/2007  . Dyslipidemia, goal LDL below 70 06/24/2007    Class: Chronic  . Essential hypertension 06/24/2007    Class: Diagnosis of  . Coronary artery disease involving native coronary artery of native heart with angina pectoris ([place]) 06/24/2007  . COLONIC POLYPS, HX OF 06/24/2007   PCP:  [name], MD Pharmacy:   [id number] - [place], [place] [address] [place] [address] Phone: [phone] Fax: [phone]

## 2020-12-04 NOTE — Telephone Encounter (Signed)
TC from  [place] with [facility]. Stating order is needed to start [place] on Pt. As well as who the attending will be. Informed Roderic Ovens that Dr Julien Nordmann  Stated he will be the attending but Hospice orders are on hold right now because Pt is being admitted today. Informed her that when he gets discharged orders will be faxed.

## 2020-12-04 NOTE — ED Triage Notes (Signed)
Patient states weak this am upon waking went to restroom and stood up from toilet and had witness full body seizure, wife caught patient. Patient went to bed and had another full body seizure. Each seizure lasted 20-30 seconds. Denies hx of seziure. Hx of prostate and lung cancer.

## 2020-12-05 ENCOUNTER — Other Ambulatory Visit: Payer: Medicare Other

## 2020-12-05 ENCOUNTER — Ambulatory Visit: Payer: Medicare Other | Admitting: Internal Medicine

## 2020-12-05 ENCOUNTER — Ambulatory Visit: Payer: Medicare Other

## 2020-12-08 ENCOUNTER — Telehealth: Payer: Self-pay | Admitting: Hematology
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
## 2020-12-09 NOTE — Telephone Encounter (Signed)
I called Ms. Felten today.  She mentioned that her husband is having a lot of fatigue and weakness as well as lack of appetite and he did not eat much in the last few days.  She is worried that he is too weak to receive his chemo on Wednesday.  She still likes to bring him to the clinic for a visit and lab work and possibly fluid if needed.  I explained to the patient that we will be happy to give him fluid when he comes or sooner if needed.  We will hold his treatment until future date.  I will use the chemotherapy slot to give him fluid on Wednesday.  She is very appreciative for the call back and agreed with the current plan.

## 2020-12-11 ENCOUNTER — Ambulatory Visit: Payer: Medicare Other

## 2020-12-11 ENCOUNTER — Other Ambulatory Visit: Payer: Self-pay

## 2020-12-11 ENCOUNTER — Encounter: Payer: Self-pay | Admitting: Physician Assistant

## 2020-12-11 ENCOUNTER — Inpatient Hospital Stay (HOSPITAL_BASED_OUTPATIENT_CLINIC_OR_DEPARTMENT_OTHER): Payer: Medicare Other | Admitting: Physician Assistant

## 2020-12-11 ENCOUNTER — Inpatient Hospital Stay: Payer: Medicare Other

## 2020-12-11 VITALS — BP 103/74 | HR 97 | Temp 97.4°F | Resp 14 | Ht 75.0 in | Wt 158.9 lb

## 2020-12-11 DIAGNOSIS — C3492 Malignant neoplasm of unspecified part of left bronchus or lung: Secondary | ICD-10-CM

## 2020-12-11 DIAGNOSIS — Z5112 Encounter for antineoplastic immunotherapy: Secondary | ICD-10-CM

## 2020-12-11 DIAGNOSIS — E876 Hypokalemia: Secondary | ICD-10-CM | POA: Diagnosis not present

## 2020-12-11 DIAGNOSIS — R110 Nausea: Secondary | ICD-10-CM

## 2020-12-11 DIAGNOSIS — Z7189 Other specified counseling: Secondary | ICD-10-CM

## 2020-12-11 DIAGNOSIS — R059 Cough, unspecified: Secondary | ICD-10-CM | POA: Diagnosis not present

## 2020-12-11 DIAGNOSIS — E860 Dehydration: Secondary | ICD-10-CM | POA: Insufficient documentation

## 2020-12-11 LAB — CMP (CANCER CENTER ONLY)
ALT: 11 U/L (ref 0–44)
AST: 33 U/L (ref 15–41)
Albumin: 2.1 g/dL — ABNORMAL LOW (ref 3.5–5.0)
Alkaline Phosphatase: 185 U/L — ABNORMAL HIGH (ref 38–126)
Anion gap: 13 (ref 5–15)
BUN: 26 mg/dL — ABNORMAL HIGH (ref 8–23)
CO2: 28 mmol/L (ref 22–32)
Calcium: 8.7 mg/dL — ABNORMAL LOW (ref 8.9–10.3)
Chloride: 103 mmol/L (ref 98–111)
Creatinine: 2.13 mg/dL — ABNORMAL HIGH (ref 0.61–1.24)
GFR, Estimated: 31 mL/min — ABNORMAL LOW (ref >60)
Glucose, Bld: 100 mg/dL — ABNORMAL HIGH (ref 70–99)
Potassium: 2.6 mmol/L — CL (ref 3.5–5.1)
Sodium: 144 mmol/L (ref 135–145)
Total Bilirubin: 1.1 mg/dL (ref 0.3–1.2)
Total Protein: 6.5 g/dL (ref 6.5–8.1)

## 2020-12-11 LAB — CBC WITH DIFFERENTIAL (CANCER CENTER ONLY)
Abs Immature Granulocytes: 0.03 10*3/uL (ref 0.00–0.07)
Basophils Absolute: 0 10*3/uL (ref 0.0–0.1)
Basophils Relative: 0 %
Eosinophils Absolute: 0 10*3/uL (ref 0.0–0.5)
Eosinophils Relative: 1 %
HCT: 26.8 % — ABNORMAL LOW (ref 39.0–52.0)
Hemoglobin: 8.7 g/dL — ABNORMAL LOW (ref 13.0–17.0)
Immature Granulocytes: 1 %
Lymphocytes Relative: 11 %
Lymphs Abs: 0.4 10*3/uL — ABNORMAL LOW (ref 0.7–4.0)
MCH: 31 pg (ref 26.0–34.0)
MCHC: 32.5 g/dL (ref 30.0–36.0)
MCV: 95.4 fL (ref 80.0–100.0)
Monocytes Absolute: 0.7 10*3/uL (ref 0.1–1.0)
Monocytes Relative: 19 %
Neutro Abs: 2.3 10*3/uL (ref 1.7–7.7)
Neutrophils Relative %: 68 %
Platelet Count: 152 10*3/uL (ref 150–400)
RBC: 2.81 MIL/uL — ABNORMAL LOW (ref 4.22–5.81)
RDW: 17 % — ABNORMAL HIGH (ref 11.5–15.5)
WBC Count: 3.4 10*3/uL — ABNORMAL LOW (ref 4.0–10.5)
nRBC: 0 % (ref 0.0–0.2)

## 2020-12-11 MED ORDER — ONDANSETRON HCL 4 MG/2ML IJ SOLN
INTRAMUSCULAR | Status: AC
  Filled 2020-12-11: qty 4

## 2020-12-11 MED ORDER — SODIUM CHLORIDE 0.9 % IV SOLN
Freq: Once | INTRAVENOUS | Status: AC
  Filled 2020-12-11: qty 250

## 2020-12-11 MED ORDER — ONDANSETRON HCL 4 MG/2ML IJ SOLN
4.0000 mg | Freq: Once | INTRAMUSCULAR | Status: AC
  Administered 2020-12-11: 4 mg via INTRAVENOUS

## 2020-12-11 MED ORDER — HYDROCODONE-HOMATROPINE 5-1.5 MG/5ML PO SYRP: 5.0000 mL | ORAL_SOLUTION | Freq: Four times a day (QID) | ORAL | 0 refills | Status: AC | PRN

## 2020-12-11 MED ORDER — POTASSIUM CHLORIDE 10 MEQ/100ML IV SOLN
10.0000 meq | INTRAVENOUS | Status: AC
  Administered 2020-12-11 (×3): 10 meq via INTRAVENOUS

## 2020-12-11 MED ORDER — POTASSIUM CHLORIDE CRYS ER 20 MEQ PO TBCR: 20.0000 meq | EXTENDED_RELEASE_TABLET | Freq: Two times a day (BID) | ORAL | 0 refills | Status: AC

## 2020-12-11 MED ORDER — POTASSIUM CHLORIDE 10 MEQ/100ML IV SOLN
INTRAVENOUS | Status: AC
  Filled 2020-12-11: qty 300

## 2020-12-11 MED ORDER — ONDANSETRON HCL 4 MG/2ML IJ SOLN: 4.0000 mg | Freq: Once | INTRAMUSCULAR | Status: DC

## 2020-12-11 NOTE — Progress Notes (Signed)
73  Pt c/o nausea, MD/RN notified, stated will place order for Zofran

## 2020-12-11 NOTE — Progress Notes (Signed)
[facility] OFFICE PROGRESS NOTE  [name], MD [place] [address]  DIAGNOSIS: Stage IV (T3, N2, M1c)non-small celllung cancer, adenocarcinomapresented with left upper lobe lung mass with occlusion of the left upper lobe bronchus and near complete left lower lobe atelectasis in addition to mediastinal lymphadenopathy in the AP window and suspicious malignant left pleural effusion and multifocal bone metastasis diagnosed in August 2021.  BIOMARKER(S)         % CFDNA OR AMPLIFICATION        ASSOCIATED FDA-APPROVED THERAPIES         CLINICAL TRIAL AVAILABILITY [id number]* 0.2% None   Yes  [id number] 0.2% None Yes  PRIOR THERAPY: Status post left Pleurx placement for drainage of recurrent pleural effusion under the care of Dr. Roxan Hockey on 08/05/2020.  CURRENT THERAPY: Systemic chemotherapy with carboplatin for AUC of 5, Alimta 500 mg/M2 and Keytruda 200 mg IV every 3 weeks.  First dose 08/15/2020.  Status post 5 cycles.  Starting from cycle #5 he will be on maintenance treatment with Alimta and Keytruda every 3 weeks. Treatment on hold at this time.   INTERVAL HISTORY: DOVER HEAD 75 y.o. male returns to the clinic today for a follow-up visit accompanied by his wife.  In the interval since his last appointment, he was seen in the symptom management clinic on 11/27/2020 for the chief complaint of fatigue, generalized weakness, and anorexia.  The patient was given 1 L of fluid for dehydration.  He was then seen in the emergency room on 12/04/2020 for seizure-like activity.  Upon further clarification, it was noted that it was more likely secondary to a near syncopal episode.  His blood work was notable for an acute kidney injury likely secondary to dehydration.  He had a CT scan of the head which was grossly stable.  The patient had a chest x-ray which was stable.  Admission was offered for IV rehydration and hospitalization but both the patient and his wife  opted to go home at that time.  They met with a member of the hospice team but did not sign any paperwork until the patient could be seen in the clinic today to discuss his current conditions and options.  Patient is established with designated home health care who helps with his activities of daily living and transferring.   With the patient's last cycle of treatment, the patient started maintenance treatment with chemotherapy with Alimta and immunotherapy with Keytruda.  Carboplatin was dropped starting from cycle #5.  Despite being on less chemotherapy agents, the patient states that there was no improvement in his tolerability with chemotherapy; on the contrary, the patient states that he has been more weak, fatigued, and nauseous since his last round of treatment 3 weeks ago.  The patient states that he has been nauseous daily since starting chemotherapy except for 4 days total.  He lost an additional 8 pounds since his last appointment 2 weeks ago.  He has Compazine and Zofran for nausea.  The patient denies any fever, chills, or night sweats.  The patient denies any major changes with his breathing. He needs a refill of his hycodan for his cough. He does note an occasional sharp pain in his left ribs.  The patient reports that he has had some diarrhea the last few days for which he has been taking Imodium.  He denies any headache or visual changes.  The patient is here today for evaluation and a more detailed discussion about  his current condition and recommendations.    MEDICAL HISTORY: Past Medical History:  Diagnosis Date  . Arthritis    back  . BPH (benign prostatic hypertrophy)   . CAD S/P multivessel PCI: LAD, RCA and extensive LCX-OM1 2004   2004: s/p PCI to LAD, to RCA (now occluded),; 02/2011 - OM1 PCI -> 07/2011 then extensive PCI to Dominant LCx-OM1  (enitre prox-AVG Circ for dissection in 07/2011)  . Calcific aortic stenosis 01/2020   Mild to moderate  . Dyspnea   . Dysrhythmia     afib  . GERD (gastroesophageal reflux disease)   . History of colon polyps   . History of echocardiogram 08/16/2011   Echo - EF 60-65%; moderate LV concentric hypertrophy; abdnormal LV relaxation (grade 1 diastolic dysfunction; ascending aorta mildly dilated; LA moderately dilated;   . Hyperlipidemia   . Hypertension   . 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 Lung cancer, primary, with metastasis from lung to other site, left ([facility]) 2020-2021   LEFT: Stage IV (T3, N2, M1c) non-small cell lung cancer, adenocarcinoma; malignant pleural effusion and bone metastasis  . PAF (paroxysmal atrial fibrillation) ([facility]) 12/20/2012   followed by Dr. Glenetta Hew; s/p DCCV 2011, 07/2011 post PCI with Type 4a MI; CHA2DS2Vasc = 3, on Warfarin  . Paroxysmal atrial flutter ([facility]) 08/20/2011   TEE- atrial septum - no defect identified; RA normal in size, no evidence of thrombus; ascending aorta normal  . Prostate cancer ([facility]) 09/2019   Radioactive Seed Implantation; XRT (completed 03/2020), hormome Rx.  . Rupture quadriceps tendon   . Skin cancer    SKIN CA    ALLERGIES:  is allergic to lipitor [atorvastatin], morphine and related, and vicodin [hydrocodone-acetaminophen].  MEDICATIONS:  Current Outpatient Medications  Medication Sig Dispense Refill  . acetaminophen (TYLENOL) 325 MG tablet Take 650 mg by mouth every 6 (six) hours as needed for moderate pain.    [name] amiodarone (PACERONE) 200 MG tablet Take 0.5 tablets (100 mg total) by mouth daily.    [name] apixaban (ELIQUIS) 5 MG TABS tablet Take 1 tablet (5 mg total) by mouth 2 (two) times daily. 180 tablet 2  . Coenzyme Q10 200 MG capsule Take 200 mg by mouth every morning.     . ezetimibe (ZETIA) 10 MG tablet Take 1 tablet (10 mg total) by mouth daily. 90 tablet 2  . folic acid (FOLVITE) 1 MG tablet Take 1 tablet (1 mg total) by mouth daily. 30 tablet 4  .  HYDROcodone-homatropine (HYCODAN) 5-1.5 MG/5ML syrup Take 5 mLs by mouth every 6 (six) hours as needed for cough. 120 mL 0  . mirtazapine (REMERON) 30 MG tablet TAKE 1 TABLET BY MOUTH AT BEDTIME. (Patient taking differently: Take 30 mg by mouth at bedtime.) 90 tablet 1  . nitroGLYCERIN (NITROSTAT) 0.4 MG SL tablet PLACE 1 TABLET (0.4 MG TOTAL) UNDER THE TONGUE EVERY 5 (FIVE) MINUTES AS NEEDED. FOR CHEST PAIN (Patient taking differently: Place 0.4 mg under the tongue every 5 (five) minutes as needed for chest pain.) 25 tablet 4  . Omega-3 Fatty Acids (FISH OIL) 1000 MG CAPS Take 1 capsule (1,000 mg total) by mouth 2 (two) times daily. (Patient taking differently: Take 1 capsule by mouth daily.) 60 capsule 0  . ondansetron (ZOFRAN ODT) 8 MG disintegrating tablet Take 1 tablet (8 mg total) by mouth every 8 (eight) hours as needed for nausea or vomiting. 30 tablet 2  .  potassium chloride SA (KLOR-CON) 20 MEQ tablet Take 1 tablet (20 mEq total) by mouth 2 (two) times daily. 14 tablet 0  . prochlorperazine (COMPAZINE) 10 MG tablet Take 1 tablet (10 mg total) by mouth every 6 (six) hours as needed for nausea or vomiting. 30 tablet 3  . rosuvastatin (CRESTOR) 40 MG tablet Take 1 tablet (40 mg total) by mouth daily. 90 tablet 3  . tamsulosin (FLOMAX) 0.4 MG CAPS capsule Take 0.4 mg by mouth daily.     No current facility-administered medications for this visit.    SURGICAL HISTORY:  Past Surgical History:  Procedure Laterality Date  . BACK SURGERY    . CHEST TUBE INSERTION Left 08/05/2020   Procedure: INSERTION LEFT PLEURAL DRAINAGE CATHETER;  Surgeon: Melrose Nakayama, MD;  Location: [facility];  Service: Thoracic;  Laterality: Left;  . COLONOSCOPY    . CORONARY ANGIOPLASTY WITH STENT PLACEMENT  03/15/2012   multiple stens in AVGroove Circ & OM1;; 2 site PTCA w/ PTCA and stenting of OM2 and  PTCA of distal stent ISR followed by PTCA of mid LCx ISR  . CORONARY STENT PLACEMENT  2003 - 2011 - 2012 - 2013    Pre-2012 - BMS in RCA now known occluded, BMS in proximal LAD; 07/2011 - 2.25 x 23 BMS in OM 1 with significant disease on either side noted shortly after -->  2 additional overlapping DES 2.5 mm x 30 mm and 2.25 mm x 26 mm in OM1,; 3 overlapping Resolute DES in AV groove Cx crossing OM1: (Tapered from 3.8-2.6 mm)- Resolute DES 3.5 x 22, 3.0 x 38, 2.5 x 14  . ENDARTERECTOMY Left 02/27/2016   Procedure: LEFT  CAROTID ARTERY ENDARTERECTOMY ;  Surgeon: Serafina Mitchell, MD;  Location: [facility];  Service: Vascular;  Laterality: Left;  . LEFT HEART CATHETERIZATION WITH CORONARY ANGIOGRAM N/A 03/15/2012   Procedure: LEFT HEART CATHETERIZATION WITH CORONARY ANGIOGRAM;  Surgeon: [name], MD;  Location: [facility] CATH LAB: patent LAD stents, patent LCx stents w/80% focal ISR just distal to OM; OM proximal stent open; -- 2 site PTCA-PCI  . LUMBAR LAMINECTOMY/DECOMPRESSION MICRODISCECTOMY Bilateral 12/11/2014   Procedure: Bilateral Lumbar Three-Four Laminectomy;  Surgeon: Kristeen Miss, MD;  Location: [facility];  Service: Neurosurgery;  Laterality: Bilateral;  bilateral  . NM MYOVIEW LTD  11/2012   No ischemia or infarction, normal EF & WM  . PATCH ANGIOPLASTY Left 02/27/2016   Procedure: WITH HEMASHIELD DACRON  PATCH ANGIOPLASTY;  Surgeon: Serafina Mitchell, MD;  Location: [facility];  Service: Vascular;  Laterality: Left;  [name] QUADRICEPS TENDON REPAIR  09/19/2012   Procedure: REPAIR QUADRICEP TENDON;  Surgeon: [name], MD;  Location: [facility];  Service: Orthopedics;  Laterality: Left;  . TRANSTHORACIC ECHOCARDIOGRAM  01/2020   EF 55-60%.  Normal wall motion.  Moderate concentric LVH.  GRII DD with severe LA dilation..    Mild to moderate Calcific Aortic Stenosis.Mildly elevated PAP and borderline elevated RAP.  [name] VASECTOMY      REVIEW OF SYSTEMS:   Review of Systems  Constitutional: Positive for appetite change, fatigue, generalized weakness, and unexplained weight loss.  Negative for chills and fever  HENT:  Negative for mouth sores, nosebleeds, sore throat and trouble swallowing.   Eyes: Negative for eye problems and icterus.  Respiratory: Positive for stable cough and dyspnea on exertion. Negative for hemoptysis and wheezing.   Cardiovascular: Positive for occasional left rib pain. Negative for leg swelling.  Gastrointestinal: Positive for nausea  and diarrhea. Negative for abdominal pain, constipation, and vomiting.  Genitourinary: Negative for bladder incontinence, difficulty urinating, dysuria, frequency and hematuria.   Musculoskeletal: Negative for back pain, gait problem, neck pain and neck stiffness.  Skin: Negative for itching and rash.  Neurological: Negative for dizziness, extremity weakness, gait problem, headaches, light-headedness and seizures.  Hematological: Negative for adenopathy. Does not bruise/bleed easily.  Psychiatric/Behavioral: Negative for confusion, depression and sleep disturbance. The patient is not nervous/anxious.     PHYSICAL EXAMINATION:  Blood pressure 103/74, pulse 97, temperature (!) 97.4 F (36.3 C), temperature source Tympanic, resp. rate 14, height '6\' 3"'  (1.905 m), weight 158 lb 14.4 oz (72.1 kg), SpO2 100 %.  ECOG PERFORMANCE STATUS: 2-3  Physical Exam  Constitutional: Oriented to person, place, and thin appearing male and in no distress.  HENT:  Head: Normocephalic and atraumatic.  Mouth/Throat: Oropharynx is clear and moist. No oropharyngeal exudate.  Eyes: Conjunctivae are normal. Right eye exhibits no discharge. Left eye exhibits no discharge. No scleral icterus.  Neck: Normal range of motion. Neck supple.  Cardiovascular: Normal rate, regular rhythm, normal heart sounds and intact distal pulses.   Pulmonary/Chest: Effort normal. Decreased breath sounds in base of left lung. No respiratory distress. No wheezes. No rales.  Abdominal: Soft. Bowel sounds are normal. Exhibits no distension and no mass. There is no tenderness.  Musculoskeletal: Normal  range of motion. Exhibits no edema.  Lymphadenopathy:    No cervical adenopathy.  Neurological: Alert and oriented to person, place, and time. Exhibits muscle wasting. The patient was examined in the wheelchair.  Skin: Skin is warm and dry. No rash noted. Not diaphoretic. No erythema. No pallor.  Psychiatric: Mood, memory and judgment normal.  Vitals reviewed.  LABORATORY DATA: Lab Results  Component Value Date   WBC 3.4 (L) 12/11/2020   HGB 8.7 (L) 12/11/2020   HCT 26.8 (L) 12/11/2020   MCV 95.4 12/11/2020   PLT 152 12/11/2020      Chemistry      Component Value Date/Time   NA 144 12/11/2020 1110   NA 140 08/15/2019 0936   K 2.6 (LL) 12/11/2020 1110   CL 103 12/11/2020 1110   CO2 28 12/11/2020 1110   BUN 26 (H) 12/11/2020 1110   BUN 27 08/15/2019 0936   CREATININE 2.13 (H) 12/11/2020 1110   CREATININE 1.43 (H) 07/29/2016 0814      Component Value Date/Time   CALCIUM 8.7 (L) 12/11/2020 1110   ALKPHOS 185 (H) 12/11/2020 1110   AST 33 12/11/2020 1110   ALT 11 12/11/2020 1110   BILITOT 1.1 12/11/2020 1110       RADIOGRAPHIC STUDIES:  CT Head Wo Contrast  Result Date: 12/04/2020 CLINICAL DATA:  Seizure EXAM: CT HEAD WITHOUT CONTRAST TECHNIQUE: Contiguous axial images were obtained from the base of the skull through the vertex without intravenous contrast. COMPARISON:  None. FINDINGS: Brain: There is mild diffuse atrophy. There is no intracranial mass, hemorrhage, extra-axial fluid collection, or midline shift. There is slight small vessel disease in the centra semiovale bilaterally. There is small vessel disease with prior lacunar type infarcts in the anterior and posterior limbs of the right internal capsule. No acute appearing infarct evident. Vascular: No hyperdense vessel. There is calcification in each carotid siphon region as well as in the distal right vertebral artery. Skull: Bony calvarium appears intact. Sinuses/Orbits: There is mucosal thickening in the right  maxillary antrum. There is mucosal thickening in several ethmoid air cells. Orbits appear symmetric bilaterally. Other: Visualized  mastoid air cells clear. IMPRESSION: Atrophy with mild periventricular small vessel disease. Small vessel disease in the right internal capsule. No acute infarct. No mass or hemorrhage. There are foci of arterial vascular calcification. There is mucosal thickening in several paranasal sinus regions. Electronically Signed   By: Lowella Grip III M.D.   On: 12/04/2020 13:49   DG Chest Portable 1 View  Result Date: 12/04/2020 CLINICAL DATA:  Seizures, lung cancer, evaluate for complications, aspiration EXAM: PORTABLE CHEST 1 VIEW COMPARISON:  11/05/2020 FINDINGS: No significant interval change in examination of the chest, with a small, loculated left pleural effusion and associated atelectasis or consolidation. The right lung is normally aerated. Mild cardiomegaly. IMPRESSION: No significant interval change in examination of the chest, with a small, loculated left pleural effusion and associated atelectasis or consolidation. The right lung is normally aerated. No acute appearing airspace opacity. Electronically Signed   By: Eddie Candle M.D.   On: 12/04/2020 12:39     ASSESSMENT/PLAN:  This is a very pleasant 75 year old Caucasian male diagnosed with stage IV (T3,N2, M1 C) non-small cell lung cancer, adenocarcinoma. He presented with a left upper lobe lung mass and occlusion of the left upper lobe bronchus as well as complete left lower lobe atelectasis. He also had mediastinal lymphadenopathy in the AP window as well as a malignant left pleural effusion and metastatic disease to the bone. He was diagnosed in August 2021. The patient's molecular studies by [facility] did not show any actionable mutations. There was not enough material from his pleural effusion fluid for molecular studies by [facility].  The patient is currently undergoing systemic chemotherapy with  carboplatin for an AUC of 5, Alimta 500 mg per metered squared, Keytruda 200 mg IV every 3 weeks. He is status post 5 cycles. Udencya injections were added on day 3 to prevent neutropenia/dose delays.   Starting from cycle #5, the patient started maintenance treatment with Alimta and Keytruda IV every 3 weeks.  The patient was seen with Dr. Julien Nordmann today.  The patient and his wife were discussing possible options such as palliative care vs hospice vs continuing on treatment. The patient's wife mentioned that they do not think the patient can continue with chemotherapy.  The patient's wife had questions regarding what services were offered with hospice vs palliatice care/home health. The patient's wife wondered if hospice can arrange IVF. The patient currently has home health with [facility]; however, they do not assist with IVF.  Dr. Julien Nordmann discussed that hospice typically does not offer services that prolong life, specifically, arranging for IV fluid.  Dr. Julien Nordmann recommends that the patient continue to take an additional 3 weeks off of treatment to get stronger.  If he feels better in 3 weeks, the patient can consider resuming treatment, possibly with single agent immunotherapy with Keytruda.  In the meantime, if the patient and his wife decide to go with hospice, they will let us know.  The patient will not receive treatment today.  Instead, the patient will receive 1 L of IV fluid and 30 meq of IV potassium for hypokalemia.  The patient's potassium was 2.6 today and the patient's creatinine was elevated suggesting dehydration.  I have also sent a prescription for potassium chloride to the patient's pharmacy to take 40 mEq daily for 7 days.  The patient was advised that if they feel that he needs IV fluids, to call the clinic in the morning and that we can arrange for that.  He was  encouraged to increase his oral fluid intake and drink Gatorade and Pedialyte. For his diarrhea I reviewed the  dosing instructions for imodium. Discussed the importance of managing diarrhea to prevent worsening electrolyte disturbances and dehydration.  The patient was given a prescription for a refill of Hycodan.  We will see the patient back for follow-up visit in 3 weeks for reevaluation, repeat blood work, and a more detailed discussion about his current condition and possible treatment options.  The patient was advised to call immediately if he has any concerning symptoms in the interval. The patient voices understanding of current disease status and treatment options and is in agreement with the current care plan. All questions were answered. The patient knows to call the clinic with any problems, questions or concerns. We can certainly see the patient much sooner if necessary   Orders Placed This Encounter  Procedures  . CBC with Differential (Cancer Center Only)    Standing Status:   Future    Standing Expiration Date:   12/11/2021  . CMP ([facility] only)    Standing Status:   Future    Standing Expiration Date:   12/11/2021     I spent 30-39 minutes for this encounter  Tylene Quashie L [place]io Santilli, PA-C 12/11/20   ADDENDUM: Hematology/Oncology Attending: I had a face-to-face encounter with the patient today. I reviewed his record, lab and recommended his care plan. This is a very pleasant 75 years old white male with a stage IV non-small cell lung cancer adenocarcinoma diagnosed in August 2021 with no actionable mutations. The patient started on systemic chemotherapy with carboplatin, Alimta and Keytruda for 4 cycles followed by 1 cycle of maintenance treatment with Alimta and Keytruda. He had rough time with this treatment with significant fatigue and weakness as well as lack of appetite, weight loss and dehydration. He responded to the treatment with improvement of his disease after cycle #3. The patient and his wife are now more interested in a palliative care setting. He is too weak  to proceed with any further chemotherapy at least for now. For the dehydration, I will arrange for the patient to receive 1 L of normal saline in the clinic today with potassium chloride supplements because of the significant hypokalemia. We will hold his treatment for now and the patient will be seen by the palliative care on hospice service. We will see him back for follow-up visit in around 3 weeks for reevaluation and more detailed discussion of his treatment options after he takes a treatment break. We also offered the patient to come once or twice a week for IV fluid as needed but the wife mentioned that it is very hard for her to get him to the cancer center. They will try to push oral fluid during this time. He was advised to call immediately if he has any other concerning symptoms in the interval. The time spent by me on reviewing his records, consultation and changing care plan was 42 minutes. Disclaimer: This note was dictated with voice recognition software. Similar sounding words can inadvertently be transcribed and may be missed upon review. Eilleen Kempf, MD 12/11/20

## 2020-12-24 DEATH — deceased

## 2021-01-02 ENCOUNTER — Ambulatory Visit: Payer: Medicare Other

## 2021-01-02 ENCOUNTER — Ambulatory Visit: Payer: Medicare Other | Admitting: Internal Medicine

## 2021-01-02 ENCOUNTER — Other Ambulatory Visit: Payer: Medicare Other

## 2021-01-02 ENCOUNTER — Encounter: Payer: Medicare Other | Admitting: Nutrition

## 2021-01-23 ENCOUNTER — Other Ambulatory Visit: Payer: Medicare Other

## 2021-01-23 ENCOUNTER — Ambulatory Visit: Payer: Medicare Other | Admitting: Physician Assistant

## 2021-01-23 ENCOUNTER — Ambulatory Visit: Payer: Medicare Other

## 2021-01-31 IMAGING — CT CT HEAD W/O CM
3 of 4 series · 15 of 47 positions shown, 18 images · non-contrast
Comparison: None.

CLINICAL DATA: Seizure

EXAM:
CT HEAD WITHOUT CONTRAST
TECHNIQUE: Contiguous axial images were obtained from the base of the skull
through the vertex without intravenous contrast.

[Series 2: head wo · axial · 0.47mm/px · z∈[-177,-37]mm · 9 of 34 slices shown, 12 images]
[im 3/34  brain]
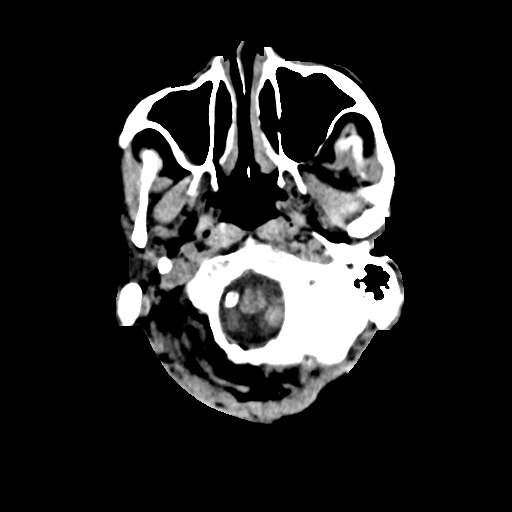
[im 3/34  bone]
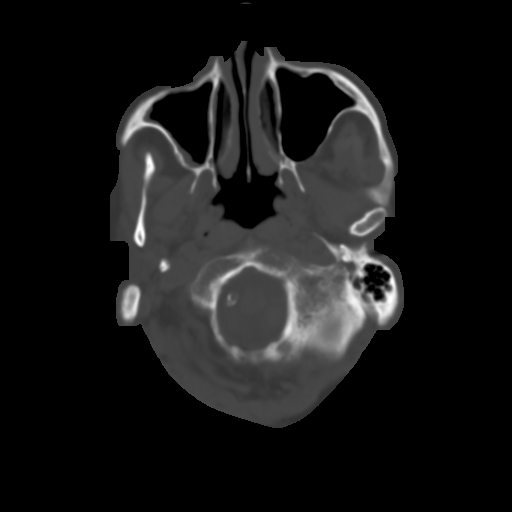
[im 8/34  brain]
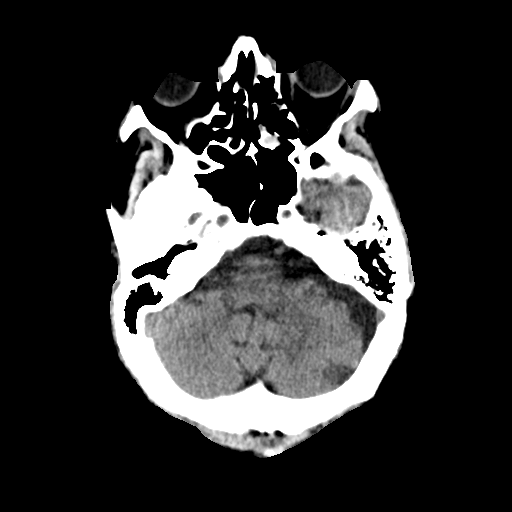
[im 10/34  brain]
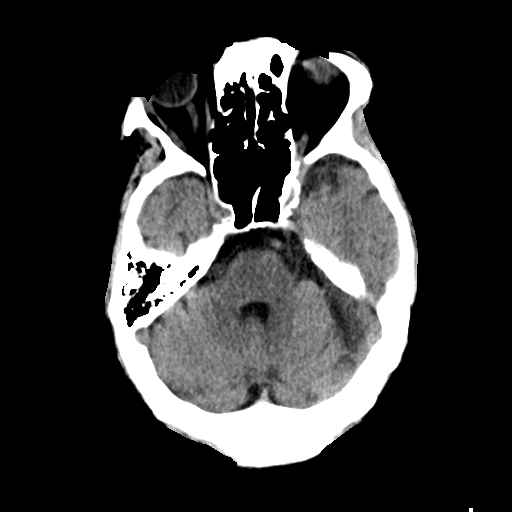
[im 15/34  brain]
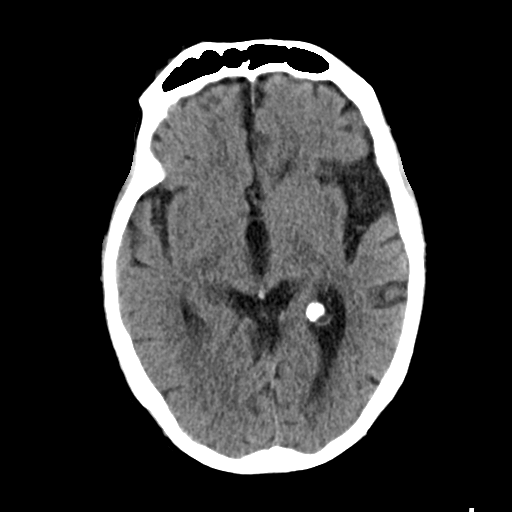
[im 17/34  brain]
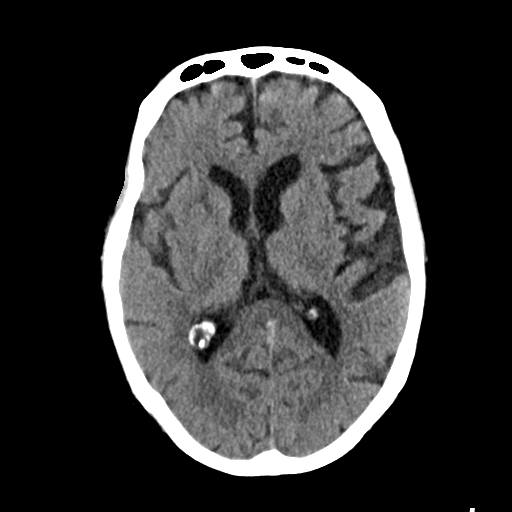
[im 17/34  bone]
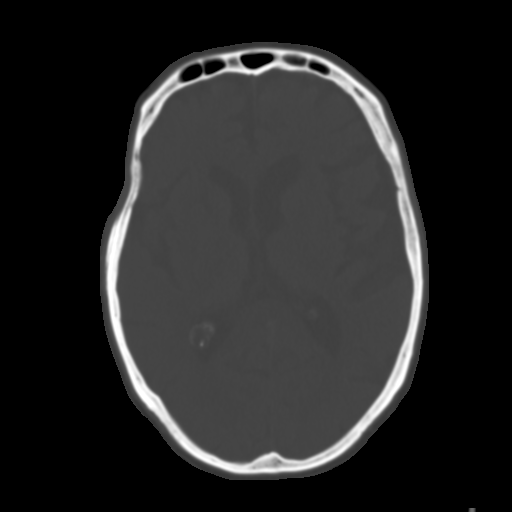
[im 19/34  brain]
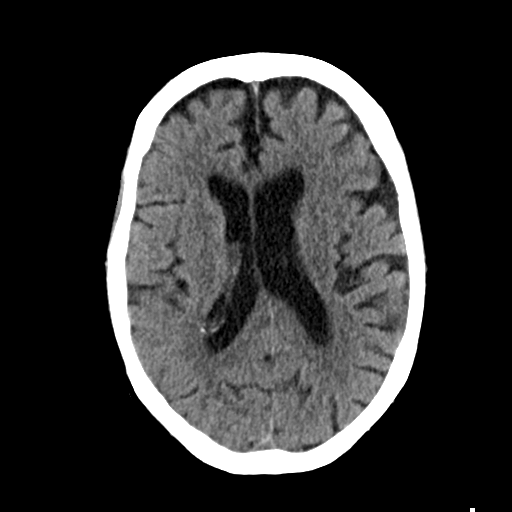
[im 24/34  brain]
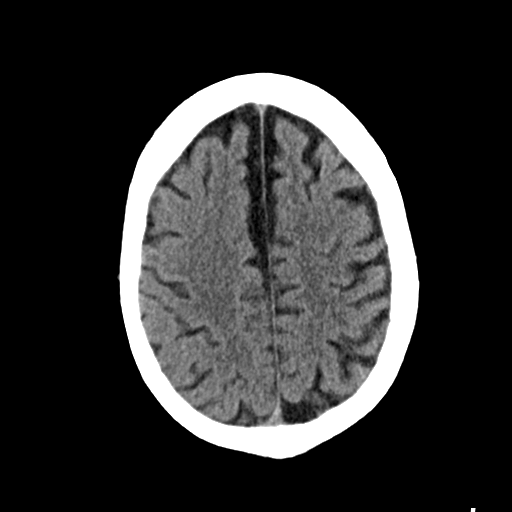
[im 26/34  brain]
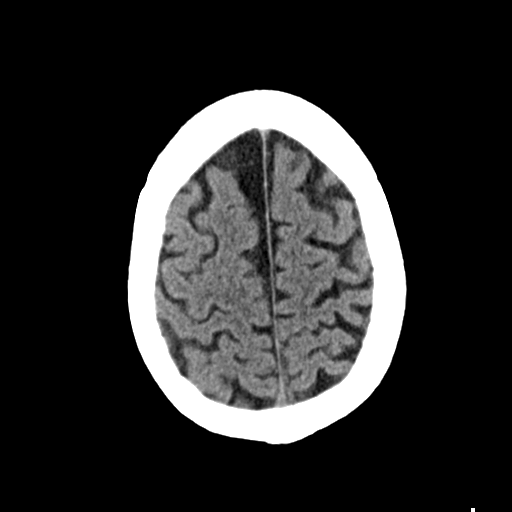
[im 31/34  brain]
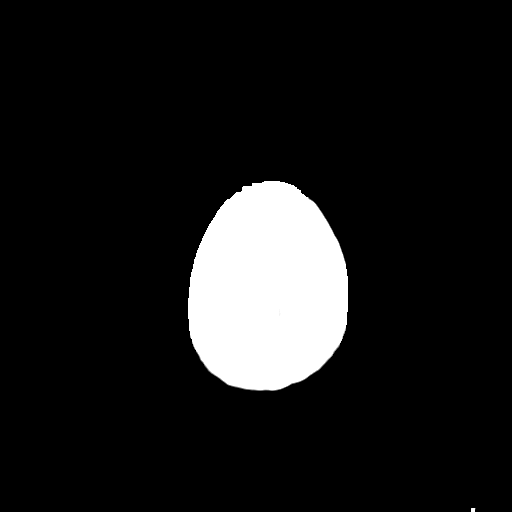
[im 31/34  bone]
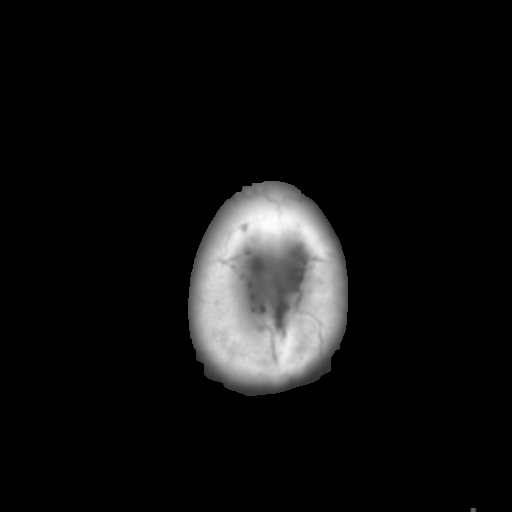

[Series 4: coronal soft tissue · coronal · 0.33mm/px · 3 of 73 slices shown]
[im 25/73  brain]
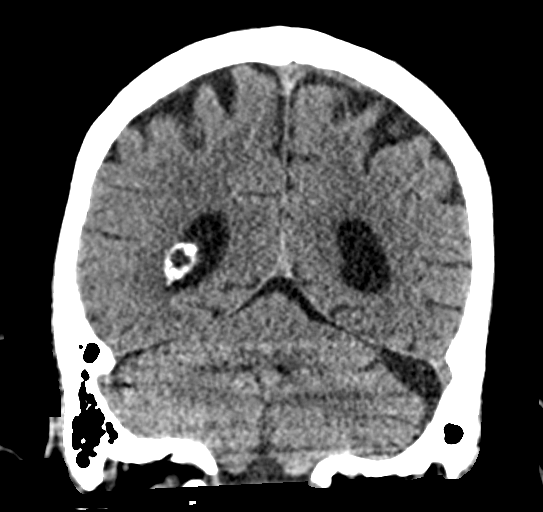
[im 33/73  brain]
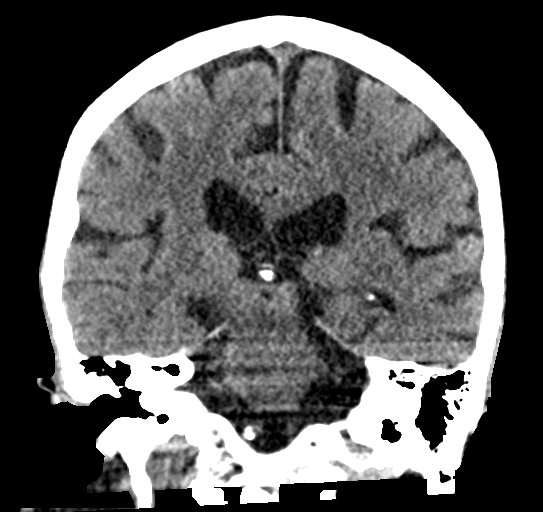
[im 41/73  brain]
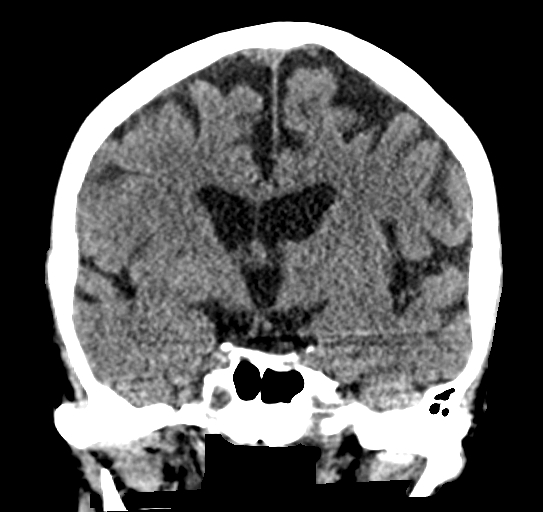

[Series 5: sagittal soft tissue · sagittal · 0.34mm/px · 3 of 54 slices shown]
[im 18/54  brain]
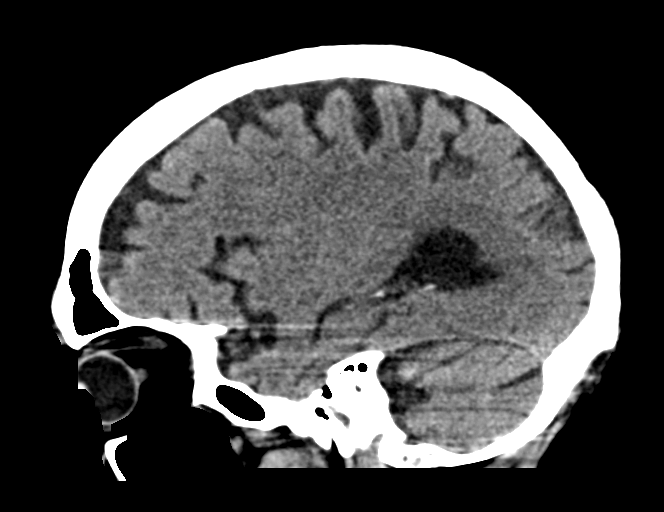
[im 27/54  brain]
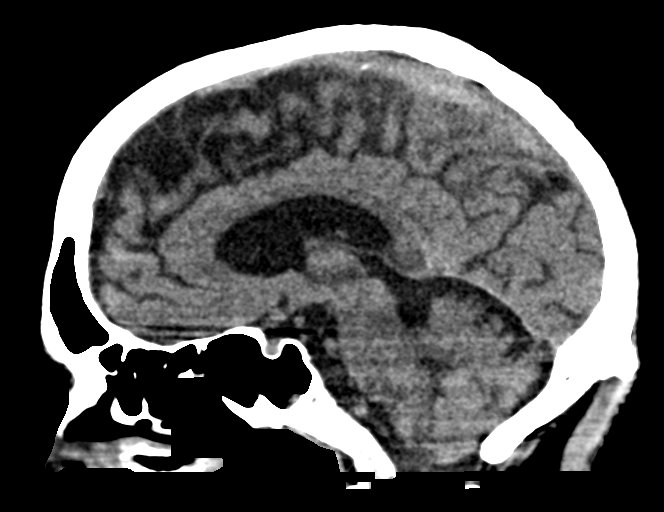
[im 36/54  brain]
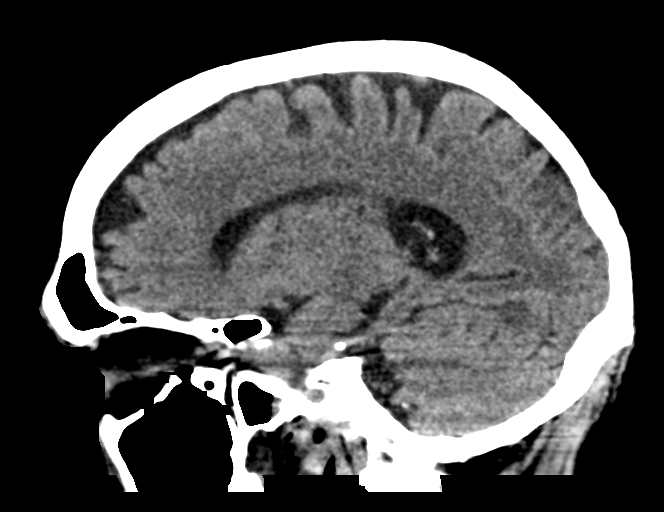

[15 of 47 positions shown; findings below may reference images not displayed]

FINDINGS: Brain: There is mild diffuse atrophy. There is no intracranial mass,
hemorrhage, extra-axial fluid collection, or midline shift. There is
slight small vessel disease in the centra semiovale bilaterally.
There is small vessel disease with prior lacunar type infarcts in
the anterior and posterior limbs of the right internal capsule. No
acute appearing infarct evident.

Vascular: No hyperdense vessel. There is calcification in each
carotid siphon region as well as in the distal right vertebral
artery.

Skull: Bony calvarium appears intact.

Sinuses/Orbits: There is mucosal thickening in the right maxillary
antrum. There is mucosal thickening in several ethmoid air cells.
Orbits appear symmetric bilaterally.

Other: Visualized mastoid air cells clear.
IMPRESSION: Atrophy with mild periventricular small vessel disease. Small vessel
disease in the right internal capsule. No acute infarct. No mass or
hemorrhage.

There are foci of arterial vascular calcification. There is mucosal
thickening in several paranasal sinus regions.

## 2021-02-13 ENCOUNTER — Other Ambulatory Visit: Payer: Medicare Other

## 2021-02-13 ENCOUNTER — Ambulatory Visit: Payer: Medicare Other | Admitting: Internal Medicine

## 2021-02-13 ENCOUNTER — Ambulatory Visit: Payer: Medicare Other

## 2021-05-12 ENCOUNTER — Ambulatory Visit: Payer: Medicare Other | Admitting: Cardiology
